# Patient Record
Sex: Male | Born: 1940 | Race: White | Hispanic: No | Marital: Married | Smoking: Former smoker
Health system: Southern US, Community
[De-identification: ages and names within clinical notes are randomized; demographics above are authoritative.]

## PROBLEM LIST (undated history)

## (undated) DIAGNOSIS — K8689 Other specified diseases of pancreas: Secondary | ICD-10-CM

## (undated) DIAGNOSIS — E785 Hyperlipidemia, unspecified: Secondary | ICD-10-CM

## (undated) DIAGNOSIS — M722 Plantar fascial fibromatosis: Secondary | ICD-10-CM

## (undated) DIAGNOSIS — H269 Unspecified cataract: Secondary | ICD-10-CM

## (undated) DIAGNOSIS — K219 Gastro-esophageal reflux disease without esophagitis: Secondary | ICD-10-CM

## (undated) DIAGNOSIS — R011 Cardiac murmur, unspecified: Secondary | ICD-10-CM

## (undated) DIAGNOSIS — Z86010 Personal history of colonic polyps: Secondary | ICD-10-CM

## (undated) DIAGNOSIS — N189 Chronic kidney disease, unspecified: Secondary | ICD-10-CM

## (undated) DIAGNOSIS — M109 Gout, unspecified: Secondary | ICD-10-CM

## (undated) DIAGNOSIS — H409 Unspecified glaucoma: Secondary | ICD-10-CM

## (undated) DIAGNOSIS — M7660 Achilles tendinitis, unspecified leg: Secondary | ICD-10-CM

## (undated) DIAGNOSIS — T7840XA Allergy, unspecified, initial encounter: Secondary | ICD-10-CM

## (undated) DIAGNOSIS — T4145XA Adverse effect of unspecified anesthetic, initial encounter: Secondary | ICD-10-CM

## (undated) DIAGNOSIS — I10 Essential (primary) hypertension: Secondary | ICD-10-CM

## (undated) DIAGNOSIS — M1990 Unspecified osteoarthritis, unspecified site: Secondary | ICD-10-CM

## (undated) DIAGNOSIS — E119 Type 2 diabetes mellitus without complications: Secondary | ICD-10-CM

## (undated) DIAGNOSIS — T8859XA Other complications of anesthesia, initial encounter: Secondary | ICD-10-CM

## (undated) DIAGNOSIS — M23204 Derangement of unspecified medial meniscus due to old tear or injury, left knee: Secondary | ICD-10-CM

## (undated) DIAGNOSIS — C61 Malignant neoplasm of prostate: Secondary | ICD-10-CM

## (undated) HISTORY — DX: Gastro-esophageal reflux disease without esophagitis: K21.9

## (undated) HISTORY — DX: Hyperlipidemia, unspecified: E78.5

## (undated) HISTORY — PX: CATARACT EXTRACTION: SUR2

## (undated) HISTORY — DX: Essential (primary) hypertension: I10

## (undated) HISTORY — DX: Derangement of unspecified medial meniscus due to old tear or injury, left knee: M23.204

## (undated) HISTORY — DX: Unspecified cataract: H26.9

## (undated) HISTORY — DX: Unspecified osteoarthritis, unspecified site: M19.90

## (undated) HISTORY — DX: Other specified diseases of pancreas: K86.89

## (undated) HISTORY — PX: TONSILLECTOMY: SUR1361

## (undated) HISTORY — PX: PROSTATE BIOPSY: SHX241

## (undated) HISTORY — DX: Type 2 diabetes mellitus without complications: E11.9

## (undated) HISTORY — DX: Chronic kidney disease, unspecified: N18.9

## (undated) HISTORY — DX: Unspecified glaucoma: H40.9

## (undated) HISTORY — DX: Gout, unspecified: M10.9

## (undated) HISTORY — DX: Personal history of colonic polyps: Z86.010

## (undated) HISTORY — DX: Allergy, unspecified, initial encounter: T78.40XA

## (undated) HISTORY — PX: COLONOSCOPY: SHX174

---
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
## 2008-04-18 ENCOUNTER — Encounter: Payer: Self-pay | Admitting: Internal Medicine

## 2008-04-18 LAB — CONVERTED CEMR LAB
Glucose, Bld: 127 mg/dL
HCT: 40.5 %
Hemoglobin: 13.8 g/dL
MCV: 94.2 fL
Platelets: 214 10*3/uL
RBC: 4.3 M/uL
RDW: 13.2 %
WBC: 5.9 10*3/uL

## 2009-08-08 ENCOUNTER — Encounter: Payer: Self-pay | Admitting: Internal Medicine

## 2009-08-08 LAB — CONVERTED CEMR LAB
Cholesterol: 169 mg/dL
HDL: 35 mg/dL
Hgb A1c MFr Bld: 6.5 %
LDL Cholesterol: 89 mg/dL
Triglyceride fasting, serum: 226 mg/dL

## 2009-11-16 HISTORY — PX: TOTAL HIP ARTHROPLASTY: SHX124

## 2009-11-21 ENCOUNTER — Encounter: Payer: Self-pay | Admitting: Internal Medicine

## 2009-11-21 LAB — CONVERTED CEMR LAB
Cholesterol: 161 mg/dL
HDL: 30 mg/dL
Hgb A1c MFr Bld: 5.7 %
LDL Cholesterol: 94 mg/dL
PSA: 3.77 ng/mL
Triglyceride fasting, serum: 187 mg/dL

## 2010-05-26 ENCOUNTER — Encounter: Payer: Self-pay | Admitting: Internal Medicine

## 2010-05-26 LAB — CONVERTED CEMR LAB
ALT: 18 units/L
AST: 24 units/L
Albumin: 4.1 g/dL
Alkaline Phosphatase: 67 units/L
BUN: 26 mg/dL
CO2: 27 meq/L
Calcium: 9.5 mg/dL
Chloride: 105 meq/L
Cholesterol: 205 mg/dL
Creatinine, Ser: 1.2 mg/dL
Glucose, Bld: 127 mg/dL
HCT: 38.4 %
HDL: 36 mg/dL
Hemoglobin: 13 g/dL
Hgb A1c MFr Bld: 5.9 %
LDL Cholesterol: 124 mg/dL
Platelets: 256 10*3/uL
Potassium: 4.5 meq/L
Sodium: 140 meq/L
Total Bilirubin: 0.7 mg/dL
Total Protein: 6.5 g/dL
Triglyceride fasting, serum: 222 mg/dL
WBC: 5.3 10*3/uL

## 2010-08-11 ENCOUNTER — Inpatient Hospital Stay (HOSPITAL_COMMUNITY): Admission: RE | Admit: 2010-08-11 | Discharge: 2010-08-14 | Payer: Self-pay | Admitting: Orthopedic Surgery

## 2010-08-16 DEATH — deceased

## 2010-09-26 ENCOUNTER — Encounter: Payer: Self-pay | Admitting: Internal Medicine

## 2010-09-26 LAB — CONVERTED CEMR LAB
Cholesterol: 145 mg/dL
HDL: 37 mg/dL
Hgb A1c MFr Bld: 6.1 %
LDL Cholesterol: 86 mg/dL
Triglyceride fasting, serum: 107 mg/dL

## 2011-01-04 ENCOUNTER — Encounter: Payer: Self-pay | Admitting: Internal Medicine

## 2011-01-05 ENCOUNTER — Encounter: Payer: Self-pay | Admitting: Internal Medicine

## 2011-01-05 ENCOUNTER — Other Ambulatory Visit: Payer: Medicare Other

## 2011-01-05 ENCOUNTER — Ambulatory Visit (INDEPENDENT_AMBULATORY_CARE_PROVIDER_SITE_OTHER): Payer: Medicare Other | Admitting: Internal Medicine

## 2011-01-05 ENCOUNTER — Other Ambulatory Visit: Payer: Self-pay | Admitting: Internal Medicine

## 2011-01-05 DIAGNOSIS — E785 Hyperlipidemia, unspecified: Secondary | ICD-10-CM | POA: Insufficient documentation

## 2011-01-05 DIAGNOSIS — I10 Essential (primary) hypertension: Secondary | ICD-10-CM

## 2011-01-05 DIAGNOSIS — E119 Type 2 diabetes mellitus without complications: Secondary | ICD-10-CM

## 2011-01-05 DIAGNOSIS — E1122 Type 2 diabetes mellitus with diabetic chronic kidney disease: Secondary | ICD-10-CM | POA: Insufficient documentation

## 2011-01-05 DIAGNOSIS — Z0000 Encounter for general adult medical examination without abnormal findings: Secondary | ICD-10-CM

## 2011-01-05 DIAGNOSIS — M1712 Unilateral primary osteoarthritis, left knee: Secondary | ICD-10-CM | POA: Insufficient documentation

## 2011-01-05 DIAGNOSIS — Z136 Encounter for screening for cardiovascular disorders: Secondary | ICD-10-CM

## 2011-01-05 DIAGNOSIS — Z79899 Other long term (current) drug therapy: Secondary | ICD-10-CM

## 2011-01-05 DIAGNOSIS — E1159 Type 2 diabetes mellitus with other circulatory complications: Secondary | ICD-10-CM | POA: Insufficient documentation

## 2011-01-05 DIAGNOSIS — M1990 Unspecified osteoarthritis, unspecified site: Secondary | ICD-10-CM | POA: Insufficient documentation

## 2011-01-05 DIAGNOSIS — E1169 Type 2 diabetes mellitus with other specified complication: Secondary | ICD-10-CM | POA: Insufficient documentation

## 2011-01-05 LAB — HEMOGLOBIN A1C: Hgb A1c MFr Bld: 6.7 % — ABNORMAL HIGH (ref 4.6–6.5)

## 2011-01-05 LAB — HEPATIC FUNCTION PANEL
ALT: 16 U/L (ref 0–53)
AST: 22 U/L (ref 0–37)
Albumin: 4.2 g/dL (ref 3.5–5.2)
Alkaline Phosphatase: 66 U/L (ref 39–117)
Bilirubin, Direct: 0.1 mg/dL (ref 0.0–0.3)
Total Bilirubin: 0.7 mg/dL (ref 0.3–1.2)
Total Protein: 6.8 g/dL (ref 6.0–8.3)

## 2011-01-05 LAB — LIPID PANEL
Cholesterol: 149 mg/dL (ref 0–200)
HDL: 40.6 mg/dL (ref >39.00)
LDL Cholesterol: 83 mg/dL (ref 0–99)
Total CHOL/HDL Ratio: 4
Triglycerides: 127 mg/dL (ref 0.0–149.0)
VLDL: 25.4 mg/dL (ref 0.0–40.0)

## 2011-01-05 LAB — CREATININE, SERUM: Creatinine, Ser: 1.3 mg/dL (ref 0.4–1.5)

## 2011-01-05 LAB — PSA: PSA: 3.38 ng/mL (ref 0.10–4.00)

## 2011-01-13 NOTE — Assessment & Plan Note (Signed)
Summary: NEW/MEDICARE/REQUESTS WELLNESS EXAM/CD   Vital Signs:  Patient profile:   70 year old male Height:      72 inches Weight:      187 pounds BMI:     25.45 O2 Sat:      96 % on Room air Temp:     97.5 degrees F oral Pulse rate:   68 / minute BP sitting:   118 / 68  (left arm) Cuff size:   regular  Vitals Entered By: Margaret Pyle, CMA (January 05, 2011 9:35 AM)  O2 Flow:  Room air CC: new pt, wellness exam   Primary Care Provider:  Newt Lukes, MD  CC:  new pt and wellness exam.  History of Present Illness: new pt to me and our practice, here to est care- [facility]) here for wellness exam - I have personally reviewed the Medicare Annual Wellness questionnaire and have noted 1.   The patient's medical and social history 2.   Their use of alcohol, tobacco or illicit drugs 3.   Their current medications and supplements 4.   The patient's functional ability including ADL's, fall risks, home safety risks and hearing or visual impairment. 5.   Diet and physical activities 6.   Evidence for depression or mood disorders  The patients weight, height, BMI and visual acuity have been recorded in the chart I have made referrals, counseling and provided education to the patient based review of the above and I have provided the pt with a written personalized care plan for preventive services.   also reviewed chronic med issues: HTN - reports compliance with ongoing medical treatment and no changes in medication dose or frequency. denies adverse side effects related to current therapy.   dyslipidemia - on statin -reports compliance with ongoing medical treatment and no changes in medication dose or frequency. denies adverse side effects related to current therapy.   DM2 - reports compliance with ongoing medical treatment and no changes in medication dose or frequency. denies adverse side effects related to current therapy. checks  cbgs  Current Medications (verified): 1)  Amlodipine Besylate 10 Mg Tabs (Amlodipine Besylate) 2)  Avandamet 2-500 Mg Tabs (Rosiglitazone-Metformin) .Marland Kitchen.. 1 By Mouth Qam 3)  Lipitor 40 Mg Tabs (Atorvastatin Calcium) .Marland Kitchen.. 1 Tab By Mouth At Bedtime 4)  Lisinopril 20 Mg Tabs (Lisinopril) .Marland Kitchen.. 1 Tab By Mouth Two Times A Day 5)  Prilosec 20 Mg Cpdr (Omeprazole) .Marland Kitchen.. 1 By Mouth At Bedtime 6)  Celebrex 200 Mg Caps (Celecoxib) .Marland Kitchen.. 1 By Mouth At Bedtime 7)  Hydrochlorothiazide 25 Mg Tabs (Hydrochlorothiazide) .Marland Kitchen.. 1 By Mouth Qam 8)  Aspirin 81 Mg Tabs (Aspirin) .Marland Kitchen.. 1 By Mouth Once Daily 9)  Glucosamine Sulfate  Powd (Glucosamine Sulfate) .Marland Kitchen.. 1 By Mouth At Bedtime 10)  Ra Krill Oil  Caps (Krill Oil) .Marland Kitchen.. 1 By Mouth Qam 11)  Senior Multivitamin Plus  Tabs (Multiple Vitamins-Minerals) .Marland Kitchen.. 1 By Mouth Once Daily 12)  Vitamin D 2000 Unit Tabs (Cholecalciferol) .Marland Kitchen.. 1 By Mouth Qam  Allergies (verified): No Known Drug Allergies  Past History:  Past Medical History: Hypertension diabetes mellitus 2 dyslipidemia Osteoarthritis  Past Surgical History: Total hip replacement, left (2011) Tonsillectomy (1945)  Family History: Family History Diabetes 1st degree relative Family History Hypertension Family History Ovarian cancer  Social History: retired married, lives with spouse no tobacco, no alcohol  Review of Systems       see HPI above. I have reviewed all other systems  and they were negative.   Physical Exam  General:  alert, well-developed, well-nourished, and cooperative to examination.    Head:  Normocephalic and atraumatic without obvious abnormalities. No apparent alopecia or balding. Eyes:  vision grossly intact; pupils equal, round and reactive to light.  conjunctiva and lids normal.    Ears:  R ear normal and L ear normal.   Mouth:  teeth and gums in good repair; mucous membranes moist, without lesions or ulcers. oropharynx clear without exudate, no erythema.  Neck:   supple, full ROM, no masses, no thyromegaly; no thyroid nodules or tenderness. no JVD or carotid bruits.   Lungs:  normal respiratory effort, no intercostal retractions or use of accessory muscles; normal breath sounds bilaterally - no crackles and no wheezes.    Heart:  normal rate, regular rhythm, no murmur, and no rub. BLE without edema. normal DP pulses and normal cap refill in all 4 extremities    Abdomen:  soft, non-tender, normal bowel sounds, no distention; no masses and no appreciable hepatomegaly or splenomegaly.   Rectal:  defer Genitalia:  defer Msk:  No deformity or scoliosis noted of thoracic or lumbar spine.   Neurologic:  alert & oriented X3 and cranial nerves II-XII symetrically intact.  strength normal in all extremities, sensation intact to light touch, and gait normal. speech fluent without dysarthria or aphasia; follows commands with good comprehension.  Skin:  no rashes, vesicles, ulcers, or erythema. No nodules or irregularity to palpation.  Psych:  Oriented X3, memory intact for recent and remote, normally interactive, good eye contact, not anxious appearing, not depressed appearing, and not agitated.     Diabetes Management Exam:    Foot Exam by Podiatrist:       Date: 01/27/2010       [facility]: [facility] [address]       Done by: [facility]   Impression & Recommendations:  Problem # 1:  PREVENTIVE HEALTH CARE (ICD-V70.0)  I have personally reviewed the Medicare Annual Wellness questionnaire and have noted 1.   The patient's medical and social history 2.   Their use of alcohol, tobacco or illicit drugs 3.   Their current medications and supplements 4.   The patient's functional ability including ADL's, fall risks, home safety risks and hearing or visual impairment. 5.   Diet and physical activities 6.   Evidence for depression or mood disorders  The patients weight, height, BMI and visual acuity have been recorded in the chart I have made referrals, counseling and provided  education to the patient based review of the above and I have provided the pt with a written personalized care plan for preventive services.    Orders: Medicare -1st Annual Wellness Visit [phone]) TLB-PSA (Prostate Specific Antigen) (84153-PSA) EKG w/ Interpretation (93000)  Problem # 2:  DIABETES MELLITUS, TYPE II (ICD-250.00)  His updated medication list for this problem includes:    Avandamet 2-500 Mg Tabs (Rosiglitazone-metformin) .Marland Kitchen... 1 by mouth two times a day    Lisinopril 20 Mg Tabs (Lisinopril) .Marland Kitchen... 1 tab by mouth two times a day    Aspirin 81 Mg Tabs (Aspirin) .Marland Kitchen... 1 by mouth once daily  Orders: TLB-Creatinine, Blood (82565-CREA) TLB-A1C / Hgb A1C (Glycohemoglobin) (83036-A1C)  Problem # 3:  HYPERTENSION (ICD-401.9)  His updated medication list for this problem includes:    Amlodipine Besylate 10 Mg Tabs (Amlodipine besylate) .Marland Kitchen... 1 by mouth once daily    Lisinopril 20 Mg Tabs (Lisinopril) .Marland Kitchen... 1 tab by mouth two times  a day    Hydrochlorothiazide 25 Mg Tabs (Hydrochlorothiazide) .Marland Kitchen... 1 by mouth  every morning  BP today: 118/68  Orders: TLB-Creatinine, Blood (82565-CREA)  Problem # 4:  DYSLIPIDEMIA (ICD-272.4)  His updated medication list for this problem includes:    Lipitor 40 Mg Tabs (Atorvastatin calcium) .Marland Kitchen... 1 tab by mouth at bedtime  Orders: TLB-Lipid Panel (80061-LIPID)  Complete Medication List: 1)  Amlodipine Besylate 10 Mg Tabs (Amlodipine besylate) .Marland Kitchen.. 1 by mouth once daily 2)  Avandamet 2-500 Mg Tabs (Rosiglitazone-metformin) .Marland Kitchen.. 1 by mouth two times a day 3)  Lipitor 40 Mg Tabs (Atorvastatin calcium) .Marland Kitchen.. 1 tab by mouth at bedtime 4)  Lisinopril 20 Mg Tabs (Lisinopril) .Marland Kitchen.. 1 tab by mouth two times a day 5)  Prilosec 20 Mg Cpdr (Omeprazole) .Marland Kitchen.. 1 by mouth at bedtime 6)  Celebrex 200 Mg Caps (Celecoxib) .Marland Kitchen.. 1 by mouth at bedtime 7)  Hydrochlorothiazide 25 Mg Tabs (Hydrochlorothiazide) .Marland Kitchen.. 1 by mouth  every morning 8)  Aspirin 81 Mg Tabs  (Aspirin) .Marland Kitchen.. 1 by mouth once daily 9)  Glucosamine Sulfate Powd (Glucosamine sulfate) .Marland Kitchen.. 1 by mouth at bedtime 10)  Ra Krill Oil Caps (Krill oil) .Marland Kitchen.. 1 by mouth  every morning 11)  Senior Multivitamin Plus Tabs (Multiple vitamins-minerals) .Marland Kitchen.. 1 by mouth once daily 12)  Vitamin D 2000 Unit Tabs (Cholecalciferol) .Marland Kitchen.. 1 by mouth  every morning 13)  Niacin Flush Free 500 Mg Caps (Inositol niacinate) .Marland Kitchen.. 1 by mouth at bedtime 14)  Magnesium Oxide 400 Mg Tabs (Magnesium oxide) .Marland Kitchen.. 1 by mouth two times a day  Other Orders: TLB-Hepatic/Liver Function Pnl (80076-HEPATIC)  Patient Instructions: 1)  it was good to see you today. 2)  we have review your prior lab results and medications as well as medical history 3)  I have provided you with a copy of your personalized plan for preventive services. Please take the time to review along with your updated medication list.  4)  test(s) ordered today - your results will be called to you after review in 48-72 hours from the time of test completion; if any changes need to be made or if there are abnormal results, you will be notified at that time (including change in diabetes medications) 5)  medications reviewed - resume Mag oxide and niacin -no other changes recommended 6)  will send for records from dr. [facility] to incorporate and review 7)  Please schedule a follow-up appointment in 3 months for diabetes check and medication review, call sooner if problems.    Orders Added: 1)  Medicare -1st Annual Wellness Visit [G0438] 2)  New Patient Level III [99203] 3)  TLB-Lipid Panel [80061-LIPID] 4)  TLB-Hepatic/Liver Function Pnl [80076-HEPATIC] 5)  TLB-Creatinine, Blood [82565-CREA] 6)  TLB-A1C / Hgb A1C (Glycohemoglobin) [83036-A1C] 7)  TLB-PSA (Prostate Specific Antigen) [84153-PSA] 8)  EKG w/ Interpretation [93000]

## 2011-01-13 NOTE — Letter (Signed)
Summary: [facility]  [facility]   Imported By: Sherian Rein 01/08/2011 10:05:48  _____________________________________________________________________  External Attachment:    Type:   Image     Comment:   External Document

## 2011-01-13 NOTE — Medication Information (Signed)
Summary: Medication Hx/Patient  Medication Hx/Patient   Imported By: Sherian Rein 01/08/2011 10:03:07  _____________________________________________________________________  External Attachment:    Type:   Image     Comment:   External Document

## 2011-01-13 NOTE — Letter (Signed)
Summary: Medicare Wellness Visit/[facility]  Medicare Wellness Visit/[facility]   Imported By: Sherian Rein 01/08/2011 10:02:00  _____________________________________________________________________  External Attachment:    Type:   Image     Comment:   External Document

## 2011-01-15 ENCOUNTER — Encounter: Payer: Self-pay | Admitting: Internal Medicine

## 2011-01-19 ENCOUNTER — Telehealth: Payer: Self-pay | Admitting: Internal Medicine

## 2011-01-27 NOTE — Letter (Signed)
[facility]   Imported By: Sherian Rein 01/21/2011 11:09:59  _____________________________________________________________________  External Attachment:    Type:   Image     Comment:   External Document

## 2011-01-27 NOTE — Progress Notes (Signed)
Summary: Labs prior?  Phone Note Call from Patient Call back at Home Phone [phone]   Caller: Patient Summary of Call: Pt has appt scheduled for 04/06/2011 and is requesting to have labs prior. Okay to schedule? Initial call taken by: Margaret Pyle, CMA,  January 19, 2011 2:19 PM  Follow-up for Phone Call        yes - a1c (250.00) - thanks Follow-up by: Newt Lukes MD,  January 19, 2011 3:40 PM  Additional Follow-up for Phone Call Additional follow up Details #1::        LaToya, can you help schedule pt for labs? Thanks Additional Follow-up by: Margaret Pyle, CMA,  January 20, 2011 8:01 AM    Additional Follow-up for Phone Call Additional follow up Details #2::    Labs scheduled, patient aware. Follow-up by: Daphane Shepherd,  January 20, 2011 8:26 AM

## 2011-01-29 LAB — BASIC METABOLIC PANEL
BUN: 14 mg/dL (ref 6–23)
BUN: 17 mg/dL (ref 6–23)
CO2: 26 mEq/L (ref 19–32)
CO2: 27 mEq/L (ref 19–32)
Calcium: 8.1 mg/dL — ABNORMAL LOW (ref 8.4–10.5)
Calcium: 8.4 mg/dL (ref 8.4–10.5)
Chloride: 102 mEq/L (ref 96–112)
Chloride: 104 mEq/L (ref 96–112)
Creatinine, Ser: 1.01 mg/dL (ref 0.4–1.5)
Creatinine, Ser: 1.38 mg/dL (ref 0.4–1.5)
GFR calc Af Amer: >60 mL/min (ref >60)
GFR calc Af Amer: >60 mL/min (ref >60)
GFR calc non Af Amer: 51 mL/min — ABNORMAL LOW (ref >60)
GFR calc non Af Amer: >60 mL/min (ref >60)
Glucose, Bld: 126 mg/dL — ABNORMAL HIGH (ref 70–99)
Glucose, Bld: 140 mg/dL — ABNORMAL HIGH (ref 70–99)
Potassium: 4.3 mEq/L (ref 3.5–5.1)
Potassium: 4.4 mEq/L (ref 3.5–5.1)
Sodium: 134 mEq/L — ABNORMAL LOW (ref 135–145)
Sodium: 135 mEq/L (ref 135–145)

## 2011-01-29 LAB — CBC
HCT: 24.6 % — ABNORMAL LOW (ref 39.0–52.0)
HCT: 25.5 % — ABNORMAL LOW (ref 39.0–52.0)
HCT: 28.3 % — ABNORMAL LOW (ref 39.0–52.0)
HCT: 36.7 % — ABNORMAL LOW (ref 39.0–52.0)
Hemoglobin: 12.5 g/dL — ABNORMAL LOW (ref 13.0–17.0)
Hemoglobin: 8.4 g/dL — ABNORMAL LOW (ref 13.0–17.0)
Hemoglobin: 8.9 g/dL — ABNORMAL LOW (ref 13.0–17.0)
Hemoglobin: 9.6 g/dL — ABNORMAL LOW (ref 13.0–17.0)
MCH: 32.4 pg (ref 26.0–34.0)
MCH: 32.5 pg (ref 26.0–34.0)
MCH: 32.5 pg (ref 26.0–34.0)
MCH: 33.4 pg (ref 26.0–34.0)
MCHC: 34 g/dL (ref 30.0–36.0)
MCHC: 34.1 g/dL (ref 30.0–36.0)
MCHC: 34.2 g/dL (ref 30.0–36.0)
MCHC: 35.2 g/dL (ref 30.0–36.0)
MCV: 94.9 fL (ref 78.0–100.0)
MCV: 95.1 fL (ref 78.0–100.0)
MCV: 95.3 fL (ref 78.0–100.0)
MCV: 95.4 fL (ref 78.0–100.0)
Platelets: 130 10*3/uL — ABNORMAL LOW (ref 150–400)
Platelets: 140 10*3/uL — ABNORMAL LOW (ref 150–400)
Platelets: 174 10*3/uL (ref 150–400)
Platelets: 182 10*3/uL (ref 150–400)
RBC: 2.59 MIL/uL — ABNORMAL LOW (ref 4.22–5.81)
RBC: 2.68 MIL/uL — ABNORMAL LOW (ref 4.22–5.81)
RBC: 2.97 MIL/uL — ABNORMAL LOW (ref 4.22–5.81)
RBC: 3.85 MIL/uL — ABNORMAL LOW (ref 4.22–5.81)
RDW: 13.4 % (ref 11.5–15.5)
RDW: 13.5 % (ref 11.5–15.5)
RDW: 13.5 % (ref 11.5–15.5)
RDW: 14.1 % (ref 11.5–15.5)
WBC: 6.9 10*3/uL (ref 4.0–10.5)
WBC: 7.9 10*3/uL (ref 4.0–10.5)
WBC: 8.2 10*3/uL (ref 4.0–10.5)
WBC: 9.8 10*3/uL (ref 4.0–10.5)

## 2011-01-29 LAB — COMPREHENSIVE METABOLIC PANEL
ALT: 18 U/L (ref 0–53)
AST: 20 U/L (ref 0–37)
Albumin: 4.1 g/dL (ref 3.5–5.2)
Alkaline Phosphatase: 78 U/L (ref 39–117)
BUN: 31 mg/dL — ABNORMAL HIGH (ref 6–23)
CO2: 27 mEq/L (ref 19–32)
Calcium: 9.3 mg/dL (ref 8.4–10.5)
Chloride: 105 mEq/L (ref 96–112)
Creatinine, Ser: 1.33 mg/dL (ref 0.4–1.5)
GFR calc Af Amer: >60 mL/min (ref >60)
GFR calc non Af Amer: 53 mL/min — ABNORMAL LOW (ref >60)
Glucose, Bld: 167 mg/dL — ABNORMAL HIGH (ref 70–99)
Potassium: 4.8 mEq/L (ref 3.5–5.1)
Sodium: 138 mEq/L (ref 135–145)
Total Bilirubin: 0.7 mg/dL (ref 0.3–1.2)
Total Protein: 6.9 g/dL (ref 6.0–8.3)

## 2011-01-29 LAB — URINALYSIS, ROUTINE W REFLEX MICROSCOPIC
Bilirubin Urine: NEGATIVE
Glucose, UA: NEGATIVE mg/dL
Hgb urine dipstick: NEGATIVE
Ketones, ur: NEGATIVE mg/dL
Nitrite: NEGATIVE
Protein, ur: NEGATIVE mg/dL
Specific Gravity, Urine: 1.023 (ref 1.005–1.030)
Urobilinogen, UA: 0.2 mg/dL (ref 0.0–1.0)
pH: 5.5 (ref 5.0–8.0)

## 2011-01-29 LAB — SURGICAL PCR SCREEN
MRSA, PCR: NEGATIVE
Staphylococcus aureus: NEGATIVE

## 2011-01-29 LAB — GLUCOSE, CAPILLARY
Glucose-Capillary: 121 mg/dL — ABNORMAL HIGH (ref 70–99)
Glucose-Capillary: 126 mg/dL — ABNORMAL HIGH (ref 70–99)
Glucose-Capillary: 138 mg/dL — ABNORMAL HIGH (ref 70–99)
Glucose-Capillary: 138 mg/dL — ABNORMAL HIGH (ref 70–99)
Glucose-Capillary: 139 mg/dL — ABNORMAL HIGH (ref 70–99)
Glucose-Capillary: 141 mg/dL — ABNORMAL HIGH (ref 70–99)
Glucose-Capillary: 142 mg/dL — ABNORMAL HIGH (ref 70–99)
Glucose-Capillary: 144 mg/dL — ABNORMAL HIGH (ref 70–99)
Glucose-Capillary: 147 mg/dL — ABNORMAL HIGH (ref 70–99)
Glucose-Capillary: 163 mg/dL — ABNORMAL HIGH (ref 70–99)
Glucose-Capillary: 194 mg/dL — ABNORMAL HIGH (ref 70–99)
Glucose-Capillary: 196 mg/dL — ABNORMAL HIGH (ref 70–99)

## 2011-01-29 LAB — PROTIME-INR
INR: 1.01 (ref 0.00–1.49)
INR: 1.15 (ref 0.00–1.49)
INR: 1.82 — ABNORMAL HIGH (ref 0.00–1.49)
INR: 2.67 — ABNORMAL HIGH (ref 0.00–1.49)
Prothrombin Time: 13.5 seconds (ref 11.6–15.2)
Prothrombin Time: 14.9 seconds (ref 11.6–15.2)
Prothrombin Time: 21.2 seconds — ABNORMAL HIGH (ref 11.6–15.2)
Prothrombin Time: 28.5 seconds — ABNORMAL HIGH (ref 11.6–15.2)

## 2011-01-29 LAB — ABO/RH: ABO/RH(D): O POS

## 2011-01-29 LAB — TYPE AND SCREEN
ABO/RH(D): O POS
Antibody Screen: NEGATIVE

## 2011-01-29 LAB — APTT: aPTT: 36 seconds (ref 24–37)

## 2011-04-01 ENCOUNTER — Other Ambulatory Visit (INDEPENDENT_AMBULATORY_CARE_PROVIDER_SITE_OTHER): Payer: Medicare Other

## 2011-04-01 ENCOUNTER — Other Ambulatory Visit: Payer: Self-pay | Admitting: Internal Medicine

## 2011-04-01 DIAGNOSIS — E119 Type 2 diabetes mellitus without complications: Secondary | ICD-10-CM

## 2011-04-01 LAB — HEMOGLOBIN A1C: Hgb A1c MFr Bld: 6 % (ref 4.6–6.5)

## 2011-04-06 ENCOUNTER — Ambulatory Visit (INDEPENDENT_AMBULATORY_CARE_PROVIDER_SITE_OTHER): Payer: Medicare Other | Admitting: Internal Medicine

## 2011-04-06 ENCOUNTER — Encounter: Payer: Self-pay | Admitting: Internal Medicine

## 2011-04-06 DIAGNOSIS — I10 Essential (primary) hypertension: Secondary | ICD-10-CM

## 2011-04-06 DIAGNOSIS — E785 Hyperlipidemia, unspecified: Secondary | ICD-10-CM

## 2011-04-06 DIAGNOSIS — E119 Type 2 diabetes mellitus without complications: Secondary | ICD-10-CM

## 2011-04-06 MED ORDER — LISINOPRIL 20 MG PO TABS: 20.0000 mg | ORAL_TABLET | Freq: Two times a day (BID) | ORAL | Status: DC

## 2011-04-06 MED ORDER — ATORVASTATIN CALCIUM 40 MG PO TABS: 40.0000 mg | ORAL_TABLET | Freq: Every day | ORAL | Status: DC

## 2011-04-06 MED ORDER — METFORMIN HCL ER 500 MG PO TB24: 1000.0000 mg | ORAL_TABLET | Freq: Two times a day (BID) | ORAL | Status: DC

## 2011-04-06 MED ORDER — HYDROCHLOROTHIAZIDE 25 MG PO TABS: 25.0000 mg | ORAL_TABLET | ORAL | Status: DC

## 2011-04-06 MED ORDER — AMLODIPINE BESYLATE 10 MG PO TABS: 10.0000 mg | ORAL_TABLET | Freq: Every day | ORAL | Status: DC

## 2011-04-06 NOTE — Assessment & Plan Note (Signed)
Lab Results  Component Value Date   LDLCALC 83 01/05/2011   Continue statin as well as omega 3 and weight control/exercise

## 2011-04-06 NOTE — Assessment & Plan Note (Signed)
BP Readings from Last 3 Encounters:  04/06/11 120/74  01/05/11 118/68   The current medical regimen is effective;  continue present plan and medications.

## 2011-04-06 NOTE — Patient Instructions (Addendum)
It was good to see you today. Labs and exam look great - keep up the good ork Medications reviewed, no changes at this time. Refill on medication(s) as discussed today. Please schedule followup in 4-6 months, call sooner if problems. Ok to do labs 2-3 days before visit

## 2011-04-06 NOTE — Assessment & Plan Note (Signed)
Lab Results  Component Value Date   HGBA1C 6.0 04/01/2011   Working on weight control as well as medication as ongoing - continue same

## 2011-04-06 NOTE — Progress Notes (Signed)
  Subjective:    Patient ID: Nathan Gomez, male    DOB: [birth year], 70 y.o.   MRN: [id number]  HPI  Here for follow up - reviewed chronic med issues today: HTN - reports compliance with ongoing medical treatment and no changes in medication dose or frequency. denies adverse side effects related to current therapy.   dyslipidemia - on statin -reports compliance with ongoing medical treatment and no changes in medication dose or frequency. denies adverse side effects related to current therapy.   DM2 - reports compliance with ongoing medical treatment and no changes in medication dose or frequency. denies adverse side effects related to current therapy. checks cbgs 1-2x/day  Past Medical History  Diagnosis Date  . Hypertension   . Diabetes mellitus     Type II  . Dyslipidemia   . Osteoarthritis      Review of Systems  Constitutional: Negative for fever.  Respiratory: Negative for cough and shortness of breath.   Musculoskeletal:       L sciatica along hip and quad  Neurological: Negative for headaches.       Objective:   Physical Exam BP 120/74  Pulse 72  Temp(Src) 98.6 F (37 C) (Oral)  Ht 6' (1.829 m)  Wt 180 lb 1.9 oz (81.702 kg)  BMI 24.43 kg/m2  SpO2 98%  Physical Exam  Constitutional:  oriented to person, place, and time. appears well-developed and well-nourished. No distress.  Neck: Normal range of motion. Neck supple. No JVD present. No thyromegaly present.  Cardiovascular: Normal rate, regular rhythm and normal heart sounds.  No murmur heard. Pulmonary/Chest: Effort normal and breath sounds normal. No respiratory distress. no wheezes.  Neurological: he is alert and oriented to person, place, and time. No cranial nerve deficit. Coordination normal.  Psychiatric: he has a normal mood and affect. behavior is normal. Judgment and thought content normal.   Lab Results  Component Value Date   WBC 6.9 08/14/2010   HGB 8.9* 08/14/2010   HCT 25.5* 08/14/2010   PLT  140* 08/14/2010   CHOL 149 01/05/2011   TRIG 127.0 01/05/2011   HDL 40.60 01/05/2011   ALT 16 01/05/2011   AST 22 01/05/2011   NA 135 08/13/2010   K 4.4 08/13/2010   CL 104 08/13/2010   CREATININE 1.3 01/05/2011   BUN 17 08/13/2010   CO2 27 08/13/2010   PSA 3.38 01/05/2011   INR 2.67* 08/14/2010   HGBA1C 6.0 04/01/2011         Assessment & Plan:  See problem list. Medications and labs reviewed today.

## 2011-09-23 ENCOUNTER — Other Ambulatory Visit (INDEPENDENT_AMBULATORY_CARE_PROVIDER_SITE_OTHER): Payer: Medicare Other

## 2011-09-23 DIAGNOSIS — E785 Hyperlipidemia, unspecified: Secondary | ICD-10-CM

## 2011-09-23 DIAGNOSIS — E119 Type 2 diabetes mellitus without complications: Secondary | ICD-10-CM

## 2011-09-23 LAB — HEMOGLOBIN A1C: Hgb A1c MFr Bld: 6.3 % (ref 4.6–6.5)

## 2011-09-23 LAB — LIPID PANEL
Cholesterol: 120 mg/dL (ref 0–200)
HDL: 39.3 mg/dL (ref >39.00)
LDL Cholesterol: 71 mg/dL (ref 0–99)
Total CHOL/HDL Ratio: 3
Triglycerides: 51 mg/dL (ref 0.0–149.0)
VLDL: 10.2 mg/dL (ref 0.0–40.0)

## 2011-09-28 ENCOUNTER — Encounter: Payer: Self-pay | Admitting: Internal Medicine

## 2011-09-28 ENCOUNTER — Ambulatory Visit (INDEPENDENT_AMBULATORY_CARE_PROVIDER_SITE_OTHER): Payer: Medicare Other | Admitting: Internal Medicine

## 2011-09-28 VITALS — BP 118/64 | HR 73 | Temp 98.6°F | Resp 16 | Wt 182.2 lb

## 2011-09-28 DIAGNOSIS — Z23 Encounter for immunization: Secondary | ICD-10-CM

## 2011-09-28 DIAGNOSIS — D649 Anemia, unspecified: Secondary | ICD-10-CM | POA: Insufficient documentation

## 2011-09-28 DIAGNOSIS — I10 Essential (primary) hypertension: Secondary | ICD-10-CM

## 2011-09-28 DIAGNOSIS — E785 Hyperlipidemia, unspecified: Secondary | ICD-10-CM

## 2011-09-28 DIAGNOSIS — E119 Type 2 diabetes mellitus without complications: Secondary | ICD-10-CM

## 2011-09-28 NOTE — Assessment & Plan Note (Signed)
Related to prior surgery - no fatigue or symptoms Due for colo 2014 - [facility] pt to call sooner if problems

## 2011-09-28 NOTE — Assessment & Plan Note (Signed)
Lab Results  Component Value Date   HGBA1C 6.3 09/23/2011   Working on weight control as well as medication as ongoing - continue same

## 2011-09-28 NOTE — Assessment & Plan Note (Signed)
BP Readings from Last 3 Encounters:  09/28/11 118/64  04/06/11 120/74  01/05/11 118/68   The current medical regimen is effective;  continue present plan and medications.

## 2011-09-28 NOTE — Progress Notes (Signed)
  Subjective:    Patient ID: Nathan Gomez, male    DOB: June 08, 1941, 70 y.o.   MRN: [id number]  HPI   Here for follow up - reviewed chronic med issues today:  HTN - reports compliance with ongoing medical treatment and no changes in medication dose or frequency. denies adverse side effects related to current therapy.   dyslipidemia - on statin -reports compliance with ongoing medical treatment and no changes in medication dose or frequency. denies adverse side effects related to current therapy.   DM2 - reports compliance with ongoing medical treatment and no changes in medication dose or frequency. denies adverse side effects related to current therapy. checks cbgs 1-2x/day  Past Medical History  Diagnosis Date  . Hypertension   . Diabetes mellitus     Type II  . Dyslipidemia   . Osteoarthritis      Review of Systems  Constitutional: Negative for fever.  Respiratory: Negative for cough and shortness of breath.   Musculoskeletal:       L sciatica along hip and quad  Neurological: Negative for headaches.       Objective:   Physical Exam  BP 118/64  Pulse 73  Temp(Src) 98.6 F (37 C) (Oral)  Resp 16  Wt 182 lb 4 oz (82.668 kg)  SpO2 96% Wt Readings from Last 3 Encounters:  09/28/11 182 lb 4 oz (82.668 kg)  04/06/11 180 lb 1.9 oz (81.702 kg)  01/05/11 187 lb (84.823 kg)   Constitutional: He appears well-developed and well-nourished. No distress.  Neck: Normal range of motion. Neck supple. No JVD present. No thyromegaly present.  Cardiovascular: Normal rate, regular rhythm and normal heart sounds.  No murmur heard. no BLE edema Pulmonary/Chest: Effort normal and breath sounds normal. No respiratory distress. no wheezes.  Neurological: he is alert and oriented to person, place, and time. No cranial nerve deficit. Coordination normal.  Psychiatric: he has a normal mood and affect. behavior is normal. Judgment and thought content normal.   Lab Results  Component  Value Date   WBC 6.9 08/14/2010   HGB 8.9* 08/14/2010   HCT 25.5* 08/14/2010   PLT 140* 08/14/2010   CHOL 120 09/23/2011   TRIG 51.0 09/23/2011   HDL 39.30 09/23/2011   ALT 16 01/05/2011   AST 22 01/05/2011   NA 135 08/13/2010   K 4.4 08/13/2010   CL 104 08/13/2010   CREATININE 1.3 01/05/2011   BUN 17 08/13/2010   CO2 27 08/13/2010   PSA 3.38 01/05/2011   INR 2.67* 08/14/2010   HGBA1C 6.3 09/23/2011         Assessment & Plan:  See problem list. Medications and labs reviewed today.  L sciatica - follows with ortho - [facility] for same - s/p back exercise program (without PT) and continued pain symptoms  - for ortho follow up as ongoing  Anemia - hx same post op - plan recheck at next OV, sooner if symptoms or problems

## 2011-09-28 NOTE — Patient Instructions (Addendum)
It was good to see you today. Labs and exam look great - keep up the good work! Medications reviewed, no changes at this time. Good luck with your hip/back pain symptoms - ask Dr. Berton Lan about physical therapy referral Please schedule followup in 6 months to check diabetes mellitus, cholesterol and anemia status, call sooner if problems.

## 2011-09-28 NOTE — Assessment & Plan Note (Signed)
Lab Results  Component Value Date   LDLCALC 71 09/23/2011   Continue statin as well as omega 3 and weight control/exercise

## 2011-12-09 ENCOUNTER — Other Ambulatory Visit: Payer: Self-pay | Admitting: Internal Medicine

## 2012-02-16 IMAGING — CR DG HIP (WITH OR WITHOUT PELVIS) 2-3V*L*
3 series · 3 of 3 positions shown · non-contrast
Comparison: None.

CLINICAL DATA: Preop evaluation for left hip replacement.

LEFT HIP - COMPLETE 2+ VIEW

[t pelvis a.p.]
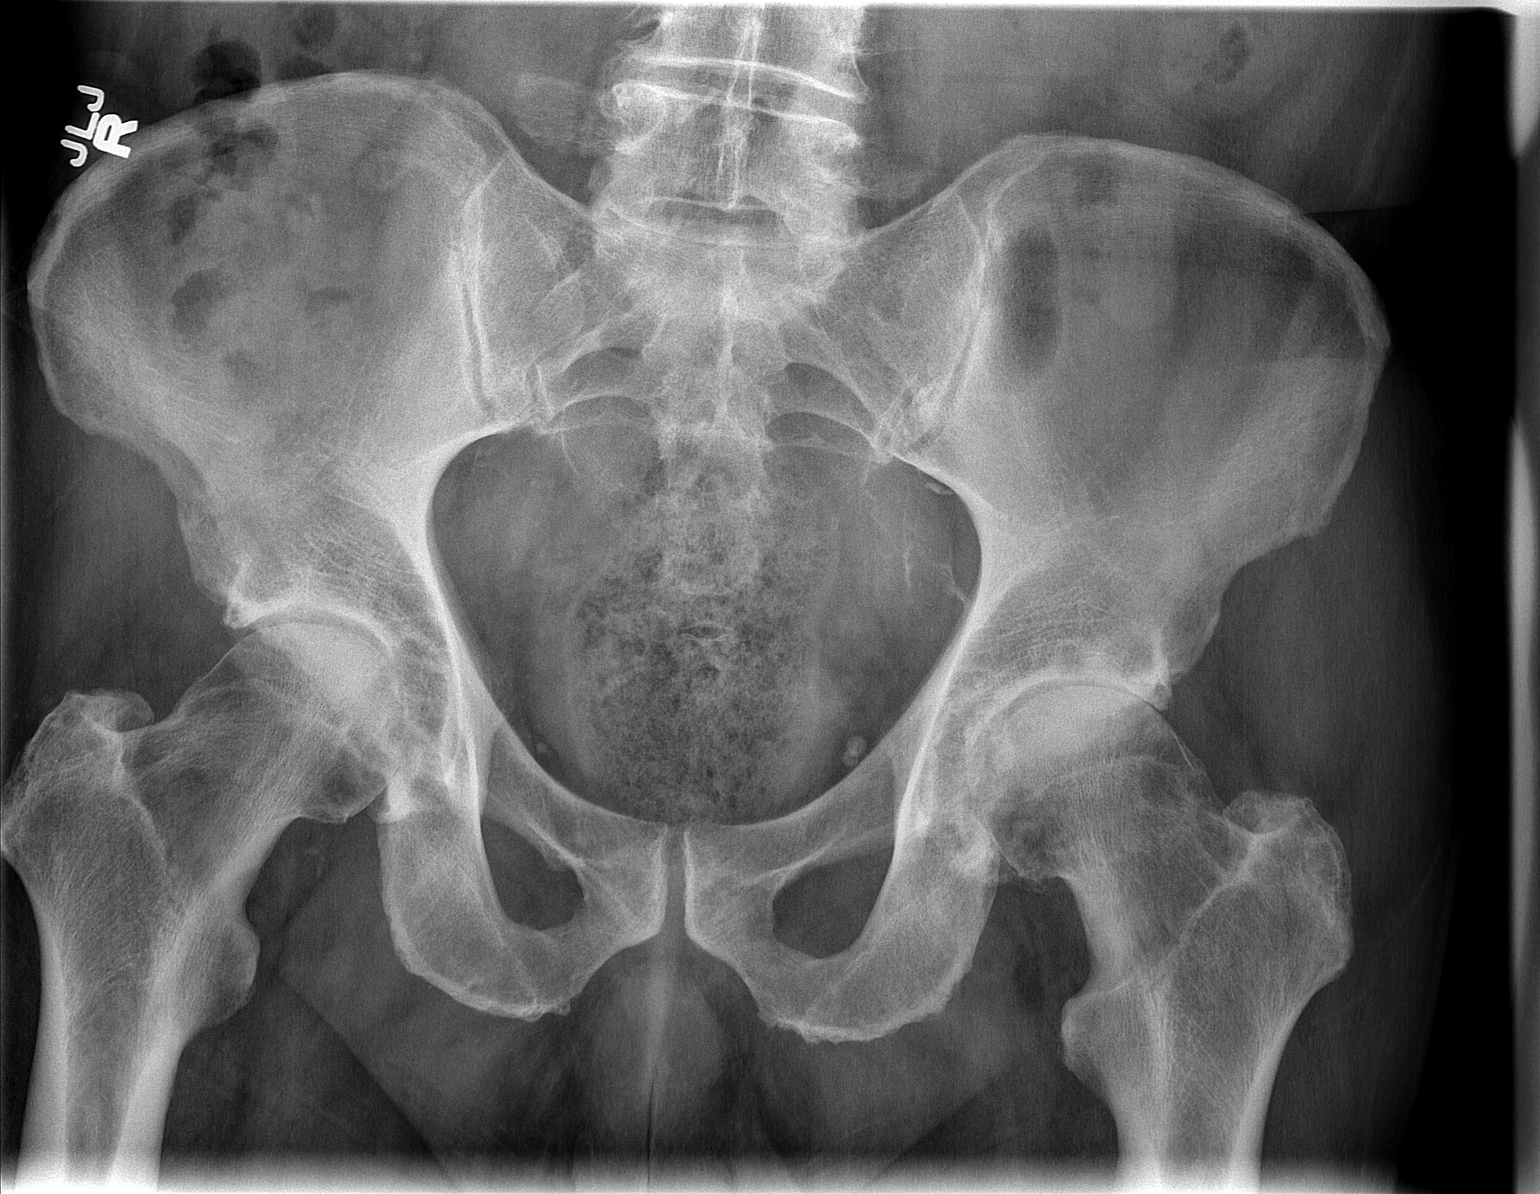

[t hip ap left]
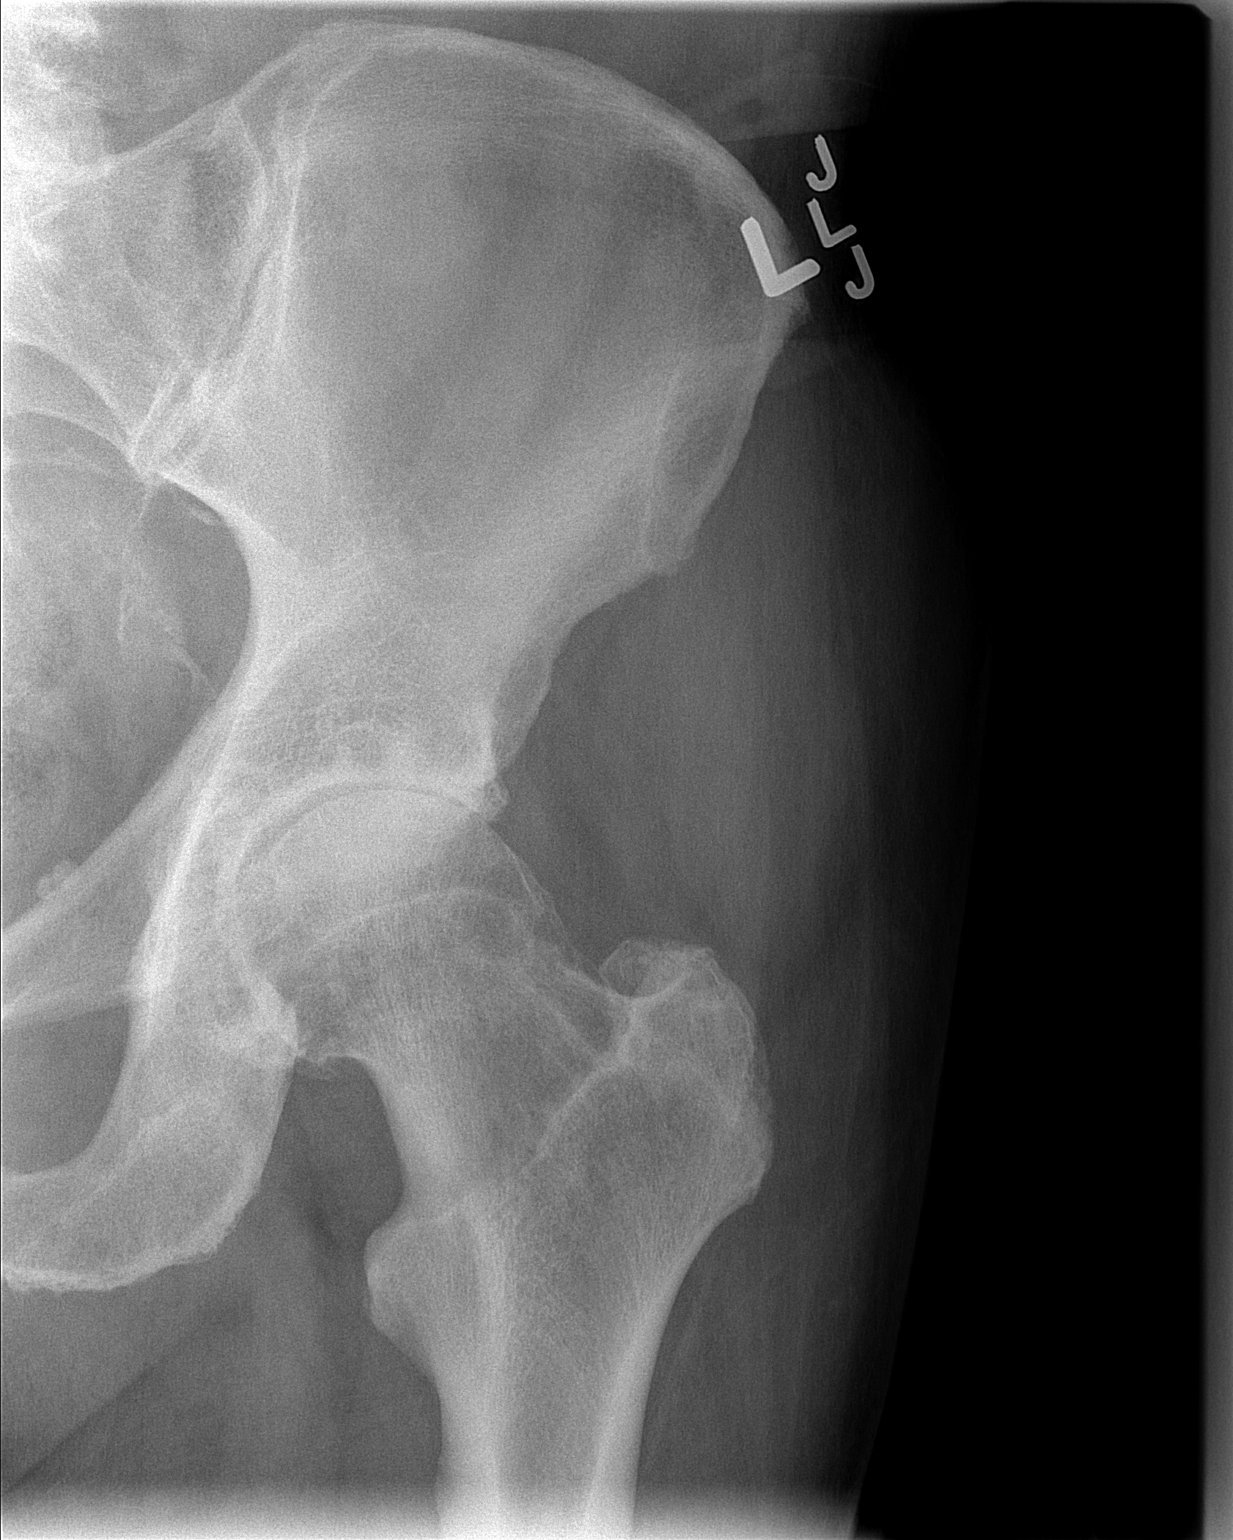

[t hip frog leg left]
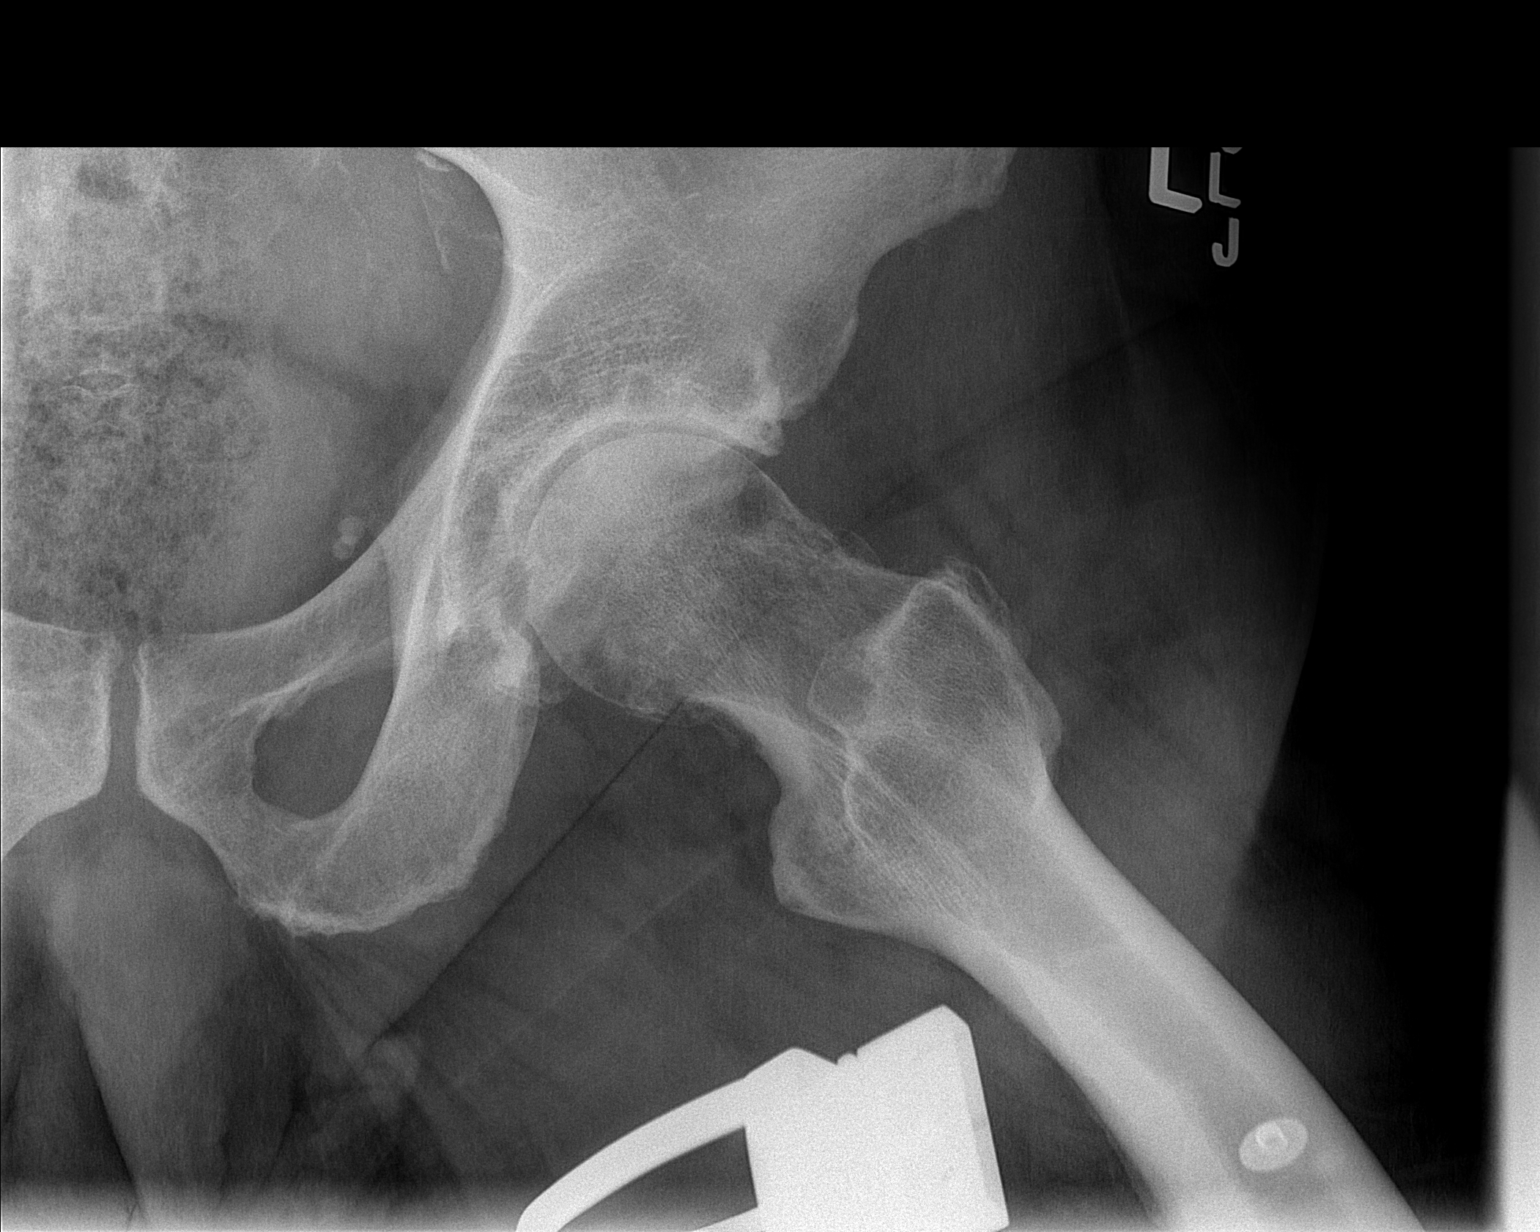

[3 of 3 positions shown; findings below may reference images not displayed]

FINDINGS: No acute bony abnormality.  Osteoarthritis is visualized
in the left hip with joint space narrowing, spurring, and
subchondral cyst formation.  More modest degenerative changes are
seen in the right hip.
IMPRESSION: Left hip osteoarthritis.

## 2012-02-22 ENCOUNTER — Encounter: Payer: Self-pay | Admitting: Internal Medicine

## 2012-02-22 ENCOUNTER — Ambulatory Visit (INDEPENDENT_AMBULATORY_CARE_PROVIDER_SITE_OTHER): Payer: Medicare Other | Admitting: Internal Medicine

## 2012-02-22 ENCOUNTER — Ambulatory Visit (INDEPENDENT_AMBULATORY_CARE_PROVIDER_SITE_OTHER)
Admission: RE | Admit: 2012-02-22 | Discharge: 2012-02-22 | Disposition: A | Discharge status: Home / Self Care | Payer: Medicare Other | Source: Ambulatory Visit | Attending: Internal Medicine | Admitting: Internal Medicine

## 2012-02-22 ENCOUNTER — Other Ambulatory Visit (INDEPENDENT_AMBULATORY_CARE_PROVIDER_SITE_OTHER): Payer: Medicare Other

## 2012-02-22 VITALS — BP 102/62 | HR 66 | Temp 97.3°F | Resp 16 | Wt 182.0 lb

## 2012-02-22 DIAGNOSIS — D649 Anemia, unspecified: Secondary | ICD-10-CM | POA: Insufficient documentation

## 2012-02-22 DIAGNOSIS — D510 Vitamin B12 deficiency anemia due to intrinsic factor deficiency: Secondary | ICD-10-CM

## 2012-02-22 DIAGNOSIS — E785 Hyperlipidemia, unspecified: Secondary | ICD-10-CM

## 2012-02-22 DIAGNOSIS — E119 Type 2 diabetes mellitus without complications: Secondary | ICD-10-CM

## 2012-02-22 DIAGNOSIS — R05 Cough: Secondary | ICD-10-CM | POA: Diagnosis not present

## 2012-02-22 DIAGNOSIS — R059 Cough, unspecified: Secondary | ICD-10-CM

## 2012-02-22 DIAGNOSIS — I10 Essential (primary) hypertension: Secondary | ICD-10-CM

## 2012-02-22 DIAGNOSIS — Z79899 Other long term (current) drug therapy: Secondary | ICD-10-CM

## 2012-02-22 LAB — CBC WITH DIFFERENTIAL/PLATELET
Basophils Absolute: 0 10*3/uL (ref 0.0–0.1)
Basophils Relative: 0.1 % (ref 0.0–3.0)
Eosinophils Absolute: 0 10*3/uL (ref 0.0–0.7)
Eosinophils Relative: 0.8 % (ref 0.0–5.0)
HCT: 41.7 % (ref 39.0–52.0)
Hemoglobin: 13.7 g/dL (ref 13.0–17.0)
Lymphocytes Relative: 24.7 % (ref 12.0–46.0)
Lymphs Abs: 1.1 10*3/uL (ref 0.7–4.0)
MCHC: 32.9 g/dL (ref 30.0–36.0)
MCV: 92.4 fl (ref 78.0–100.0)
Monocytes Absolute: 0.5 10*3/uL (ref 0.1–1.0)
Monocytes Relative: 11.3 % (ref 3.0–12.0)
Neutro Abs: 2.9 10*3/uL (ref 1.4–7.7)
Neutrophils Relative %: 63.1 % (ref 43.0–77.0)
Platelets: 182 10*3/uL (ref 150.0–400.0)
RBC: 4.52 Mil/uL (ref 4.22–5.81)
RDW: 14.2 % (ref 11.5–14.6)
WBC: 4.6 10*3/uL (ref 4.5–10.5)

## 2012-02-22 LAB — URINALYSIS, ROUTINE W REFLEX MICROSCOPIC
Bilirubin Urine: NEGATIVE
Hgb urine dipstick: NEGATIVE
Ketones, ur: NEGATIVE
Leukocytes, UA: NEGATIVE
Nitrite: NEGATIVE
Specific Gravity, Urine: 1.015 (ref 1.000–1.030)
Total Protein, Urine: NEGATIVE
Urine Glucose: NEGATIVE
Urobilinogen, UA: 0.2 (ref 0.0–1.0)
pH: 5.5 (ref 5.0–8.0)

## 2012-02-22 NOTE — Patient Instructions (Signed)
Upper Respiratory Infection, Adult An upper respiratory infection (URI) is also known as the common cold. It is often caused by a type of germ (virus). Colds are easily spread (contagious). You can pass it to others by kissing, coughing, sneezing, or drinking out of the same glass. Usually, you get better in 1 or 2 weeks.  HOME CARE   Only take medicine as told by your doctor.   Use a warm mist humidifier or breathe in steam from a hot shower.   Drink enough water and fluids to keep your pee (urine) clear or pale yellow.   Get plenty of rest.   Return to work when your temperature is back to normal or as told by your doctor. You may use a face mask and wash your hands to stop your cold from spreading.  GET HELP RIGHT AWAY IF:   After the first few days, you feel you are getting worse.   You have questions about your medicine.   You have chills, shortness of breath, or brown or red spit (mucus).   You have yellow or brown snot (nasal discharge) or pain in the face, especially when you bend forward.   You have a fever, puffy (swollen) neck, pain when you swallow, or white spots in the back of your throat.   You have a bad headache, ear pain, sinus pain, or chest pain.   You have a high-pitched whistling sound when you breathe in and out (wheezing).   You have a lasting cough or cough up blood.   You have sore muscles or a stiff neck.  MAKE SURE YOU:   Understand these instructions.   Will watch your condition.   Will get help right away if you are not doing well or get worse.  Document Released: 04/20/2008 Document Revised: 10/22/2011 Document Reviewed: 03/09/2011 [facility] Patient Information 2012 [place], [place].Anemia, Nonspecific Your exam and blood tests show you are anemic. This means your blood (hemoglobin) level is low. Normal hemoglobin values are 12 to 15 g/dL for females and 14 to 17 g/dL for males. Make a note of your hemoglobin level today. The hematocrit percent is  also used to measure anemia. A normal hematocrit is 38% to 46% in females and 42% to 49% in males. Make a note of your hematocrit level today. CAUSES  Anemia can be due to many different causes.  Excessive bleeding from periods (in women).   Intestinal bleeding.   Poor nutrition.   Kidney, thyroid, liver, and bone marrow diseases.  SYMPTOMS  Anemia can come on suddenly (acute). It can also come on slowly. Symptoms can include:  Minor weakness.   Dizziness.   Palpitations.   Shortness of breath.  Symptoms may be absent until half your hemoglobin is missing if it comes on slowly. Anemia due to acute blood loss from an injury or internal bleeding may require blood transfusion if the loss is severe. Hospital care is needed if you are anemic and there is significant continual blood loss. TREATMENT   Stool tests for blood (Hemoccult) and additional lab tests are often needed. This determines the best treatment.   Further checking on your condition and your response to treatment is very important. It often takes many weeks to correct anemia.  Depending on the cause, treatment can include:  Supplements of iron.   Vitamins B12 and folic acid.   Hormone medicines.If your anemia is due to bleeding, finding the cause of the blood loss is very important. This will help  avoid further problems.  SEEK IMMEDIATE MEDICAL CARE IF:   You develop fainting, extreme weakness, shortness of breath, or chest pain.   You develop heavy vaginal bleeding.   You develop bloody or black, tarry stools or vomit up blood.   You develop a high fever, rash, repeated vomiting, or dehydration.  Document Released: 12/10/2004 Document Revised: 10/22/2011 Document Reviewed: 09/17/2009 [facility] Patient Information 2012 [place], [place].

## 2012-02-22 NOTE — Progress Notes (Signed)
Subjective:    Patient ID: Nathan Gomez, male    DOB: 05/11/1941, 71 y.o.   MRN: [id number]  Cough This is a new problem. The current episode started in the past 7 days. The problem has been gradually improving. The problem occurs every few hours. The cough is non-productive. Associated symptoms include chills and rhinorrhea. Pertinent negatives include no chest pain, ear congestion, ear pain, fever, headaches, heartburn, hemoptysis, myalgias, nasal congestion, postnasal drip, rash, sore throat, shortness of breath, sweats, weight loss or wheezing. He has tried OTC cough suppressant for the symptoms. The treatment provided mild relief.  Diabetes He presents for his follow-up diabetic visit. He has type 2 diabetes mellitus. His disease course has been fluctuating. There are no hypoglycemic associated symptoms. Pertinent negatives for hypoglycemia include no confusion, dizziness, headaches, pallor, seizures, speech difficulty, sweats or tremors. Associated symptoms include fatigue and weakness. Pertinent negatives for diabetes include no blurred vision, no chest pain, no foot paresthesias, no foot ulcerations, no polydipsia, no polyphagia, no polyuria, no visual change and no weight loss. There are no hypoglycemic complications. Symptoms are worsening. There are no diabetic complications. Current diabetic treatment includes oral agent (monotherapy). He is compliant with treatment all of the time. His weight is stable. He is following a generally healthy diet. Meal planning includes avoidance of concentrated sweets. He has not had a previous visit with a dietician. He participates in exercise intermittently. There is no change in his home blood glucose trend. An ACE inhibitor/angiotensin II receptor blocker is being taken.  Anemia Presents for follow-up visit. Symptoms include light-headedness and malaise/fatigue. There has been no abdominal pain, anorexia, bruising/bleeding easily, confusion, fever, leg  swelling, pallor, palpitations, paresthesias, pica or weight loss. Signs of blood loss that are not present include hematemesis, hematochezia and melena. There are no compliance problems.       Review of Systems  Constitutional: Positive for chills, malaise/fatigue and fatigue. Negative for fever, weight loss, diaphoresis, activity change, appetite change and unexpected weight change.  HENT: Positive for rhinorrhea. Negative for ear pain, sore throat and postnasal drip.   Eyes: Negative.  Negative for blurred vision.  Respiratory: Positive for cough. Negative for apnea, hemoptysis, choking, chest tightness, shortness of breath and wheezing.   Cardiovascular: Negative for chest pain, palpitations and leg swelling.  Gastrointestinal: Negative for heartburn, nausea, vomiting, abdominal pain, diarrhea, constipation, blood in stool, melena, hematochezia, abdominal distention, anal bleeding, anorexia and hematemesis.  Genitourinary: Negative.  Negative for polyuria.  Musculoskeletal: Negative for myalgias, back pain, joint swelling, arthralgias and gait problem.  Skin: Negative for color change, pallor, rash and wound.  Neurological: Positive for weakness and light-headedness. Negative for dizziness, tremors, seizures, syncope, facial asymmetry, speech difficulty, numbness, headaches and paresthesias.  Hematological: Negative for polydipsia, polyphagia and adenopathy. Does not bruise/bleed easily.  Psychiatric/Behavioral: Negative.  Negative for confusion.       Objective:   Physical Exam  Vitals reviewed. Constitutional: He is oriented to person, place, and time. He appears well-developed and well-nourished. No distress.  HENT:  Head: Normocephalic and atraumatic.  Mouth/Throat: Oropharynx is clear and moist. No oropharyngeal exudate.  Eyes: Conjunctivae are normal. Right eye exhibits no discharge. Left eye exhibits no discharge. No scleral icterus.  Neck: Normal range of motion. Neck supple.  No JVD present. No tracheal deviation present. No thyromegaly present.  Cardiovascular: Normal rate, regular rhythm, normal heart sounds and intact distal pulses.  Exam reveals no gallop and no friction rub.   No murmur heard. Pulmonary/Chest:  Effort normal and breath sounds normal. No stridor. No respiratory distress. He has no wheezes. He has no rales. He exhibits no tenderness.  Abdominal: Soft. Bowel sounds are normal. He exhibits no distension and no mass. There is no tenderness. There is no rebound and no guarding.  Musculoskeletal: Normal range of motion. He exhibits no edema and no tenderness.  Lymphadenopathy:    He has no cervical adenopathy.  Neurological: He is oriented to person, place, and time.  Skin: Skin is warm and dry. No rash noted. He is not diaphoretic. No erythema. No pallor.  Psychiatric: He has a normal mood and affect. His behavior is normal. Judgment and thought content normal.     Lab Results  Component Value Date   WBC 6.9 08/14/2010   HGB 8.9* 08/14/2010   HCT 25.5* 08/14/2010   PLT 140* 08/14/2010   GLUCOSE 126* 08/13/2010   CHOL 120 09/23/2011   TRIG 51.0 09/23/2011   HDL 39.30 09/23/2011   LDLCALC 71 09/23/2011   ALT 16 01/05/2011   AST 22 01/05/2011   NA 135 08/13/2010   K 4.4 08/13/2010   CL 104 08/13/2010   CREATININE 1.3 01/05/2011   BUN 17 08/13/2010   CO2 27 08/13/2010   PSA 3.38 01/05/2011   INR 2.67* 08/14/2010   HGBA1C 6.3 09/23/2011       Assessment & Plan:

## 2012-02-23 DIAGNOSIS — R059 Cough, unspecified: Secondary | ICD-10-CM | POA: Insufficient documentation

## 2012-02-23 DIAGNOSIS — R05 Cough: Secondary | ICD-10-CM | POA: Insufficient documentation

## 2012-02-23 LAB — LIPID PANEL
Cholesterol: 133 mg/dL (ref 0–200)
HDL: 29.6 mg/dL — ABNORMAL LOW (ref >39.00)
Total CHOL/HDL Ratio: 4
Triglycerides: 212 mg/dL — ABNORMAL HIGH (ref 0.0–149.0)
VLDL: 42.4 mg/dL — ABNORMAL HIGH (ref 0.0–40.0)

## 2012-02-23 LAB — FOLATE: Folate: 21.1 ng/mL (ref >5.9)

## 2012-02-23 LAB — IBC PANEL
Iron: 62 ug/dL (ref 42–165)
Saturation Ratios: 19.9 % — ABNORMAL LOW (ref 20.0–50.0)
Transferrin: 222.1 mg/dL (ref 212.0–360.0)

## 2012-02-23 LAB — VITAMIN B12: Vitamin B-12: 349 pg/mL (ref 211–911)

## 2012-02-23 LAB — LDL CHOLESTEROL, DIRECT: Direct LDL: 80.7 mg/dL

## 2012-02-23 LAB — TSH: TSH: 1.58 u[IU]/mL (ref 0.35–5.50)

## 2012-02-23 LAB — CK: Total CK: 135 U/L (ref 7–232)

## 2012-02-23 LAB — FERRITIN: Ferritin: 136.4 ng/mL (ref 22.0–322.0)

## 2012-02-23 NOTE — Assessment & Plan Note (Signed)
I will check his CBC and will look at some vitamin levels as well

## 2012-02-23 NOTE — Assessment & Plan Note (Signed)
His BP is well controlled, I will check his lytes and renal function today 

## 2012-02-23 NOTE — Assessment & Plan Note (Signed)
I will check his a1c and will monitor his renal function 

## 2012-02-23 NOTE — Assessment & Plan Note (Signed)
His CXR is neg for infiltrate so I think this is a viral URI and have not asked him to start antibiotics, if the cough persists then I will stop his ACEI therapy

## 2012-02-23 NOTE — Assessment & Plan Note (Signed)
Will check his CBC B12 and folate levels

## 2012-02-25 ENCOUNTER — Encounter: Payer: Self-pay | Admitting: Internal Medicine

## 2012-02-25 LAB — HEMOGLOBIN A1C: Hgb A1c MFr Bld: 6.7 % — ABNORMAL HIGH (ref 4.6–6.5)

## 2012-03-02 ENCOUNTER — Encounter: Payer: Self-pay | Admitting: Internal Medicine

## 2012-03-02 ENCOUNTER — Ambulatory Visit (INDEPENDENT_AMBULATORY_CARE_PROVIDER_SITE_OTHER): Payer: Medicare Other | Admitting: Internal Medicine

## 2012-03-02 VITALS — BP 100/60 | HR 71 | Temp 98.0°F | Resp 16 | Wt 179.0 lb

## 2012-03-02 DIAGNOSIS — E119 Type 2 diabetes mellitus without complications: Secondary | ICD-10-CM | POA: Diagnosis not present

## 2012-03-02 DIAGNOSIS — E785 Hyperlipidemia, unspecified: Secondary | ICD-10-CM

## 2012-03-02 DIAGNOSIS — J309 Allergic rhinitis, unspecified: Secondary | ICD-10-CM | POA: Diagnosis not present

## 2012-03-02 DIAGNOSIS — R059 Cough, unspecified: Secondary | ICD-10-CM | POA: Diagnosis not present

## 2012-03-02 DIAGNOSIS — R05 Cough: Secondary | ICD-10-CM | POA: Diagnosis not present

## 2012-03-02 MED ORDER — LORATADINE 10 MG PO TABS: 10.0000 mg | ORAL_TABLET | Freq: Every day | ORAL | Status: DC

## 2012-03-02 MED ORDER — AZITHROMYCIN 250 MG PO TABS: ORAL_TABLET | ORAL | Status: AC

## 2012-03-02 MED ORDER — HYDROCODONE-HOMATROPINE 5-1.5 MG/5ML PO SYRP: 5.0000 mL | ORAL_SOLUTION | Freq: Three times a day (TID) | ORAL | Status: AC | PRN

## 2012-03-02 NOTE — Patient Instructions (Signed)
It was good to see you today. We have reviewed your prior records including labs and tests today Start claritin daily for next 7-10 days  To treat allergy and sinus drainage symptoms  - Also Zpak antibiotics and hydromet syrup as discussed Your prescription(s) have been submitted to your pharmacy. Please take as directed and contact our office if you believe you are having problem(s) with the medication(s). Other Medications reviewed, no changes at this time.  Please schedule followup in 6 months, call sooner if problems.

## 2012-03-02 NOTE — Assessment & Plan Note (Signed)
Lab Results  Component Value Date   HGBA1C 6.7* 02/22/2012   Working on weight control as well as medication as ongoing - continue same

## 2012-03-02 NOTE — Assessment & Plan Note (Signed)
Lab Results  Component Value Date   LDLCALC 71 09/23/2011   Continue statin as well as omega 3 and weight control/exercise  

## 2012-03-02 NOTE — Progress Notes (Signed)
Subjective:    Patient ID: Nathan Gomez, male    DOB: 1941/10/25, 71 y.o.   MRN: [id number]  HPI   Here for follow up - reviewed chronic med issues today:  HTN - reports compliance with ongoing medical treatment and no changes in medication dose or frequency. denies adverse side effects related to current therapy.   dyslipidemia - on statin -reports compliance with ongoing medical treatment and no changes in medication dose or frequency. denies adverse side effects related to current therapy.   DM2 - reports compliance with ongoing medical treatment and no changes in medication dose or frequency. denies adverse side effects related to current therapy. checks cbgs 1-2x/day  Also continued cough (OV for "flu" 2 weeks ago) No fever, clear mucus with productive cough  Past Medical History  Diagnosis Date  . Hypertension   . Diabetes mellitus, type 2     Type II  . Dyslipidemia   . Osteoarthritis      Review of Systems  Constitutional: Negative for fever and unexpected weight change.  Respiratory: Positive for cough. Negative for shortness of breath and wheezing.   Neurological: Negative for weakness and headaches.       Objective:   Physical Exam  BP 100/60  Pulse 71  Temp(Src) 98 F (36.7 C) (Oral)  Resp 16  Wt 179 lb (81.194 kg)  SpO2 95% Wt Readings from Last 3 Encounters:  03/02/12 179 lb (81.194 kg)  02/22/12 182 lb (82.555 kg)  09/28/11 182 lb 4 oz (82.668 kg)   Constitutional: He appears well-developed and well-nourished. No distress.  HENT: Normocephalic, mild sinus tenderness bilaterally. Naris with swollen turbinates and clear thick discharge. Oropharynx with cobblestoning and PND, no exudate. TMs with nonobstructing cerumen left greater than right, no effusion or erythema Neck: Normal range of motion. Neck supple. No JVD present. No thyromegaly present.  Cardiovascular: Normal rate, regular rhythm and normal heart sounds.  No murmur heard. no BLE  edema Pulmonary/Chest: Effort normal and breath sounds normal. No respiratory distress. no wheezes.  Neurological: he is alert and oriented to person, place, and time. No cranial nerve deficit. Coordination normal.  Psychiatric: he has a normal mood and affect. behavior is normal. Judgment and thought content normal.   Lab Results  Component Value Date   WBC 4.6 02/22/2012   HGB 13.7 02/22/2012   HCT 41.7 02/22/2012   PLT 182.0 02/22/2012   CHOL 133 02/22/2012   TRIG 212.0* 02/22/2012   HDL 29.60* 02/22/2012   LDLDIRECT 80.7 02/22/2012   ALT 16 01/05/2011   AST 22 01/05/2011   NA 135 08/13/2010   K 4.4 08/13/2010   CL 104 08/13/2010   CREATININE 1.3 01/05/2011   BUN 17 08/13/2010   CO2 27 08/13/2010   TSH 1.58 02/22/2012   PSA 3.38 01/05/2011   INR 2.67* 08/14/2010   HGBA1C 6.7* 02/22/2012   Dg Chest 2 View  02/22/2012  *RADIOLOGY REPORT*  Clinical Data: Cough.  Hypertension.  CHEST - 2 VIEW  Comparison: Two-view chest 08/01/2010.  Findings: The heart size is normal.  The lungs are clear.  Mild rightward curvature of the thoracic spine is stable.  IMPRESSION:  1.  No acute cardiopulmonary disease. 2.  Mild rightward curvature of the thoracic spine is stable.  Original Report Authenticated By: Jamesetta Orleans. MATTERN, M.D.       Assessment & Plan:  See problem list. Medications and labs reviewed today.   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

## 2012-04-27 DIAGNOSIS — H2510 Age-related nuclear cataract, unspecified eye: Secondary | ICD-10-CM | POA: Diagnosis not present

## 2012-05-14 ENCOUNTER — Other Ambulatory Visit: Payer: Self-pay | Admitting: Internal Medicine

## 2012-05-26 ENCOUNTER — Ambulatory Visit (INDEPENDENT_AMBULATORY_CARE_PROVIDER_SITE_OTHER): Payer: Medicare Other | Admitting: Emergency Medicine

## 2012-05-26 VITALS — BP 124/58 | HR 67 | Temp 97.6°F | Resp 18 | Ht 71.0 in | Wt 190.2 lb

## 2012-05-26 DIAGNOSIS — Z23 Encounter for immunization: Secondary | ICD-10-CM | POA: Diagnosis not present

## 2012-05-26 DIAGNOSIS — M79609 Pain in unspecified limb: Secondary | ICD-10-CM

## 2012-05-26 DIAGNOSIS — M79646 Pain in unspecified finger(s): Secondary | ICD-10-CM

## 2012-05-26 DIAGNOSIS — S61209A Unspecified open wound of unspecified finger without damage to nail, initial encounter: Secondary | ICD-10-CM | POA: Diagnosis not present

## 2012-05-26 DIAGNOSIS — S61219A Laceration without foreign body of unspecified finger without damage to nail, initial encounter: Secondary | ICD-10-CM

## 2012-05-26 MED ORDER — TETANUS-DIPHTHERIA TOXOIDS TD 5-2 LFU IM INJ
0.5000 mL | INJECTION | Freq: Once | INTRAMUSCULAR | Status: AC
  Administered 2012-05-26: 0.5 mL via INTRAMUSCULAR

## 2012-05-26 NOTE — Patient Instructions (Addendum)

## 2012-05-26 NOTE — Progress Notes (Signed)
Date:  05/26/2012   Name:  Nathan Gomez   DOB:  [birth year]   MRN:  [id number]  PCP:  Rene Paci, MD    Chief Complaint: Hand Injury   History of Present Illness:  Nathan Gomez is a 71 y.o. very pleasant male patient who presents with the following:  Cutting a package with a knife and injured his right proximal radial index finger.   1.4 cm.  Not current on TT  Patient Active Problem List  Diagnosis  . DIABETES MELLITUS, TYPE II  . DYSLIPIDEMIA  . HYPERTENSION  . OSTEOARTHRITIS  . Anemia  . Pernicious anemia  . Cough   Past Medical History  Diagnosis Date  . Hypertension   . Diabetes mellitus, type 2     Type II  . Dyslipidemia   . Osteoarthritis    Past Surgical History  Procedure Date  . Tonsillectomy 1945  . 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 Smoking status: Former Smoker    Quit date: 11/16/1986  . Smokeless tobacco: Not on file  . Alcohol Use: No   Family History  Problem Relation Age of Onset  . Cancer Mother     Ovarian  . Hypertension Father   . Diabetes Maternal Grandfather   . Pneumonia Paternal Grandfather    No Known Allergies  Medication list has been reviewed and updated.  Current Outpatient Prescriptions on File Prior to Visit  Medication Sig Dispense Refill  . amLODipine (NORVASC) 10 MG tablet TAKE 1 TABLET DAILY  90 tablet  1  . aspirin 81 MG tablet Take 81 mg by mouth daily.        Marland Kitchen atorvastatin (LIPITOR) 40 MG tablet TAKE 1 TABLET AT BEDTIME  90 tablet  1  . hydrochlorothiazide (HYDRODIURIL) 25 MG tablet TAKE 1 TABLET EVERY MORNING  90 tablet  1  . Inositol Niacinate (NIACIN FLUSH FREE) 500 MG CAPS Take 1 capsule by mouth at bedtime.        Marland Kitchen lisinopril (PRINIVIL,ZESTRIL) 20 MG tablet TAKE 1 TABLET TWICE A DAY  180 tablet  1  . loratadine (CLARITIN) 10 MG tablet Take 1 tablet (10 mg total) by mouth daily.      . magnesium oxide (MAG-OX) 400 MG tablet Take 400 mg by mouth 2 (two)  times daily.        . metFORMIN (GLUCOPHAGE-XR) 500 MG 24 hr tablet TAKE 2 TABLETS (1000 MG) TWICE A DAY  360 tablet  1  . Multiple Vitamins-Minerals (SENIOR MULTIVITAMIN PLUS) TABS Take 1 tablet by mouth daily.        Marland Kitchen omeprazole (PRILOSEC) 20 MG capsule Take 20 mg by mouth at bedtime.        Marland Kitchen RA KRILL OIL CAPS Take 1 capsule by mouth daily.        . Cholecalciferol (VITAMIN D) 2000 UNITS CAPS Take 1 capsule by mouth every morning.        . Glucosamine Sulfate POWD by Does not apply route.          Review of Systems:  As per HPI, otherwise negative.    Physical Examination: Filed Vitals:   05/26/12 1918  BP: 124/58  Pulse: 67  Temp: 97.6 F (36.4 C)  Resp: 18   Filed Vitals:   05/26/12 1918  Height: 5\' 11"  (1.803 m)  Weight: 190 lb 3.2 oz (86.274 kg)   Body mass index is 26.53 kg/(m^2). Ideal  Body Weight: Weight in (lb) to have BMI = 25: 178.9    GEN: WDWN, NAD, Non-toxic, Alert & Oriented x 3 HEENT: Atraumatic, Normocephalic.  Ears and Nose: No external deformity. EXTR: No clubbing/cyanosis/edema NEURO: Normal gait.  PSYCH: Normally interactive. Conversant. Not depressed or anxious appearing.  Calm demeanor.  1.4 cm laceration right 2nd finger, NATI  EKG / Labs / Xrays: None available at time of encounter  Assessment and Plan: Suture repair  Tetanus Follow up in one week.  Carmelina Dane, MD

## 2012-05-26 NOTE — Progress Notes (Signed)
Patient ID: HUGHES WYNDHAM MRN: [id number], DOB: [birth year], 71 y.o. Date of Encounter: 05/26/2012, 7:59 PM   PROCEDURE NOTE: Verbal consent obtained. Sterile technique employed. Numbing: Anesthesia obtained with 2% lidocaine plain.  Cleansed with soap and water. Irrigated.  Wound explored, no deep structures involved, no foreign bodies.   Wound repaired with # 4-0 prolene. #5 SI sutures Hemostasis obtained. Wound cleansed and dressed.  Wound care instructions including precautions covered with patient. Handout given.  Anticipate suture removal in 7-10 days  Rhoderick Moody, PA-C 05/26/2012 7:59 PM

## 2012-05-27 ENCOUNTER — Telehealth: Payer: Self-pay

## 2012-05-27 DIAGNOSIS — H2510 Age-related nuclear cataract, unspecified eye: Secondary | ICD-10-CM | POA: Diagnosis not present

## 2012-05-27 DIAGNOSIS — H18419 Arcus senilis, unspecified eye: Secondary | ICD-10-CM | POA: Diagnosis not present

## 2012-05-27 DIAGNOSIS — E119 Type 2 diabetes mellitus without complications: Secondary | ICD-10-CM | POA: Diagnosis not present

## 2012-06-03 ENCOUNTER — Ambulatory Visit (INDEPENDENT_AMBULATORY_CARE_PROVIDER_SITE_OTHER): Payer: Medicare Other | Admitting: Physician Assistant

## 2012-06-03 ENCOUNTER — Encounter: Payer: Self-pay | Admitting: Physician Assistant

## 2012-06-03 VITALS — BP 120/60 | HR 65 | Temp 98.5°F | Resp 16 | Wt 185.0 lb

## 2012-06-03 DIAGNOSIS — S61209A Unspecified open wound of unspecified finger without damage to nail, initial encounter: Secondary | ICD-10-CM

## 2012-06-03 NOTE — Progress Notes (Signed)
HPI: Patient presents for suture removal. Denies pain, erythema, drainage, fever, or chills. Says finger is still slightly swollen but not painful. Sutures placed 7/11  ROS: Negative except HPI  PE: Well healing wound on right index finger. No erythema, warmth, drainage, or warmth.   Sutures removed without difficulty. Patient tolerated well.   A/P: Suture removal - follow up as needed.

## 2012-08-08 DIAGNOSIS — Z23 Encounter for immunization: Secondary | ICD-10-CM | POA: Diagnosis not present

## 2012-08-22 DIAGNOSIS — H2510 Age-related nuclear cataract, unspecified eye: Secondary | ICD-10-CM | POA: Diagnosis not present

## 2012-08-22 DIAGNOSIS — H269 Unspecified cataract: Secondary | ICD-10-CM | POA: Diagnosis not present

## 2012-08-23 DIAGNOSIS — H2510 Age-related nuclear cataract, unspecified eye: Secondary | ICD-10-CM | POA: Diagnosis not present

## 2012-08-29 DIAGNOSIS — H2510 Age-related nuclear cataract, unspecified eye: Secondary | ICD-10-CM | POA: Diagnosis not present

## 2012-09-07 ENCOUNTER — Ambulatory Visit: Payer: Medicare Other | Admitting: Internal Medicine

## 2012-09-16 ENCOUNTER — Encounter: Payer: Self-pay | Admitting: Internal Medicine

## 2012-09-16 ENCOUNTER — Ambulatory Visit (INDEPENDENT_AMBULATORY_CARE_PROVIDER_SITE_OTHER): Payer: Medicare Other | Admitting: Internal Medicine

## 2012-09-16 ENCOUNTER — Other Ambulatory Visit (INDEPENDENT_AMBULATORY_CARE_PROVIDER_SITE_OTHER): Payer: Medicare Other

## 2012-09-16 VITALS — BP 130/72 | HR 68 | Temp 97.8°F | Ht 71.0 in | Wt 188.8 lb

## 2012-09-16 DIAGNOSIS — I10 Essential (primary) hypertension: Secondary | ICD-10-CM

## 2012-09-16 DIAGNOSIS — E119 Type 2 diabetes mellitus without complications: Secondary | ICD-10-CM

## 2012-09-16 DIAGNOSIS — E785 Hyperlipidemia, unspecified: Secondary | ICD-10-CM

## 2012-09-16 DIAGNOSIS — Z0000 Encounter for general adult medical examination without abnormal findings: Secondary | ICD-10-CM | POA: Diagnosis not present

## 2012-09-16 LAB — HEMOGLOBIN A1C: Hgb A1c MFr Bld: 6.3 % (ref 4.6–6.5)

## 2012-09-16 LAB — CREATININE, SERUM: Creatinine, Ser: 1.3 mg/dL (ref 0.4–1.5)

## 2012-09-16 NOTE — Patient Instructions (Signed)
It was good to see you today. We have reviewed your prior records including labs and tests today Test(s) ordered today. Your results will be released to MyChart (or called to you) after review, usually within 72hours after test completion. If any changes need to be made, you will be notified at that same time. Medications reviewed, no changes at this time.  Please schedule followup in 6 months for diabetes mellitus check, call sooner if problems.

## 2012-09-16 NOTE — Progress Notes (Signed)
Subjective:    Patient ID: Nathan Gomez, male    DOB: 1941-10-28, 71 y.o.   MRN: [id number]  HPI  Here for medicare wellness  Diet: heart healthy, diabetic Physical activity: sedentary Depression/mood screen: negative Hearing: intact to whispered voice Visual acuity: grossly normal, performs annual eye exam  ADLs: capable Fall risk: none Home safety: good Cognitive evaluation: intact to orientation, naming, recall and repetition EOL planning: adv directives, full code/ I agree  I have personally reviewed and have noted 1. The patient's medical and social history 2. Their use of alcohol, tobacco or illicit drugs 3. Their current medications and supplements 4. The patient's functional ability including ADL's, fall risks, home safety risks and hearing or visual impairment. 5. Diet and physical activities 6. Evidence for depression or mood disorders  Also reviewed chronic medical issues today:  HTN - reports compliance with ongoing medical treatment and no changes in medication dose or frequency. denies adverse side effects related to current therapy.   dyslipidemia - on statin -reports compliance with ongoing medical treatment and no changes in medication dose or frequency. denies adverse side effects related to current therapy.   DM2 - reports compliance with ongoing medical treatment and no changes in medication dose or frequency. denies adverse side effects related to current therapy. checks cbgs 1-2x/day   Past Medical History  Diagnosis Date  . Hypertension   . Diabetes mellitus, type 2     Type II  . Dyslipidemia   . Osteoarthritis    Family History  Problem Relation Age of Onset  . Cancer Mother     Ovarian  . Hypertension Father   . Diabetes Maternal Grandfather   . Pneumonia Paternal Grandfather    History  Substance Use Topics  . Smoking status: Former Smoker    Quit date: 11/16/1986  . Smokeless tobacco: Not on file  . Alcohol Use: No    Review  of Systems  Constitutional: Negative for fever and unexpected weight change.  Respiratory: Positive for cough. Negative for shortness of breath and wheezing.   Neurological: Negative for weakness and headaches.  Cardiovascular: Negative for chest pain or palpitations.  Gastrointestinal: Negative for abdominal pain, no bowel changes.  Musculoskeletal: Negative for gait problem or joint swelling.  Skin: Negative for rash.  No other specific complaints in a complete review of systems (except as listed in HPI above).      Objective:   Physical Exam  BP 130/72  Pulse 68  Temp 97.8 F (36.6 C) (Oral)  Ht 5\' 11"  (1.803 m)  Wt 188 lb 12.8 oz (85.639 kg)  BMI 26.33 kg/m2  SpO2 96% Wt Readings from Last 3 Encounters:  09/16/12 188 lb 12.8 oz (85.639 kg)  06/03/12 185 lb (83.915 kg)  05/26/12 190 lb 3.2 oz (86.274 kg)   Constitutional: He appears well-developed and well-nourished. No distress.  Neck: Normal range of motion. Neck supple. No JVD present. No thyromegaly present.  Cardiovascular: Normal rate, regular rhythm and normal heart sounds.  No murmur heard. no BLE edema Pulmonary/Chest: Effort normal and breath sounds normal. No respiratory distress. no wheezes.  Neurological: he is alert and oriented to person, place, and time. No cranial nerve deficit. Coordination normal.  Psychiatric: he has a normal mood and affect. behavior is normal. Judgment and thought content normal.   Lab Results  Component Value Date   WBC 4.6 02/22/2012   HGB 13.7 02/22/2012   HCT 41.7 02/22/2012   PLT 182.0 02/22/2012   CHOL  133 02/22/2012   TRIG 212.0* 02/22/2012   HDL 29.60* 02/22/2012   LDLDIRECT 80.7 02/22/2012   ALT 16 01/05/2011   AST 22 01/05/2011   NA 135 08/13/2010   K 4.4 08/13/2010   CL 104 08/13/2010   CREATININE 1.3 01/05/2011   BUN 17 08/13/2010   CO2 27 08/13/2010   TSH 1.58 02/22/2012   PSA 3.38 01/05/2011   INR 2.67* 08/14/2010   HGBA1C 6.7* 02/22/2012         Assessment & Plan:  AWV/v70.0 -  Today patient counseled on age appropriate routine health concerns for screening and prevention, each reviewed and up to date or declined. Immunizations reviewed and up to date or declined. Labs/ECG reviewed. Risk factors for depression reviewed and negative. Hearing function and visual acuity are intact. ADLs screened and addressed as needed. Functional ability and level of safety reviewed and appropriate. Education, counseling and referrals performed based on assessed risks today. Patient provided with a copy of personalized plan for preventive services.  Also see problem list. Medications and labs reviewed today.

## 2012-09-16 NOTE — Assessment & Plan Note (Addendum)
On statin Check annually The current medical regimen is effective;  continue present plan and medications. 

## 2012-09-16 NOTE — Assessment & Plan Note (Signed)
BP Readings from Last 3 Encounters:  09/16/12 130/72  06/03/12 120/60  05/26/12 124/58   The current medical regimen is effective;  continue present plan and medications.

## 2012-09-16 NOTE — Assessment & Plan Note (Signed)
Lab Results  Component Value Date   HGBA1C 6.7* 02/22/2012  on metformin Also statin, ACEI and ASA Working on weight control as well as medication as ongoing - continue same

## 2012-09-19 DIAGNOSIS — H269 Unspecified cataract: Secondary | ICD-10-CM | POA: Diagnosis not present

## 2012-09-19 DIAGNOSIS — H2510 Age-related nuclear cataract, unspecified eye: Secondary | ICD-10-CM | POA: Diagnosis not present

## 2012-09-26 ENCOUNTER — Ambulatory Visit: Payer: Medicare Other | Admitting: Internal Medicine

## 2012-10-20 ENCOUNTER — Other Ambulatory Visit: Payer: Self-pay | Admitting: Internal Medicine

## 2012-11-21 ENCOUNTER — Encounter: Payer: Self-pay | Admitting: Internal Medicine

## 2012-11-28 ENCOUNTER — Encounter: Payer: Self-pay | Admitting: Internal Medicine

## 2012-11-29 ENCOUNTER — Encounter: Payer: Self-pay | Admitting: Internal Medicine

## 2012-12-06 ENCOUNTER — Other Ambulatory Visit: Payer: Self-pay | Admitting: *Deleted

## 2012-12-06 MED ORDER — HYDROCHLOROTHIAZIDE 25 MG PO TABS: 25.0000 mg | ORAL_TABLET | Freq: Every day | ORAL | Status: DC

## 2012-12-06 MED ORDER — LISINOPRIL 20 MG PO TABS: 20.0000 mg | ORAL_TABLET | Freq: Two times a day (BID) | ORAL | Status: DC

## 2012-12-06 MED ORDER — METFORMIN HCL ER 500 MG PO TB24: 1000.0000 mg | ORAL_TABLET | Freq: Two times a day (BID) | ORAL | Status: DC

## 2012-12-06 MED ORDER — AMLODIPINE BESYLATE 10 MG PO TABS: 10.0000 mg | ORAL_TABLET | Freq: Every day | ORAL | Status: DC

## 2012-12-06 MED ORDER — OMEPRAZOLE 20 MG PO CPDR: 20.0000 mg | DELAYED_RELEASE_CAPSULE | Freq: Every day | ORAL | Status: DC

## 2012-12-06 MED ORDER — ATORVASTATIN CALCIUM 40 MG PO TABS: 40.0000 mg | ORAL_TABLET | Freq: Every day | ORAL | Status: DC

## 2012-12-06 NOTE — Telephone Encounter (Signed)
Received e-mail pt needing all new rx sent to new mail service [facility]....Raechel Chute

## 2012-12-26 ENCOUNTER — Ambulatory Visit (AMBULATORY_SURGERY_CENTER): Payer: Medicare Other | Admitting: *Deleted

## 2012-12-26 VITALS — Ht 72.0 in | Wt 190.0 lb

## 2012-12-26 DIAGNOSIS — Z1211 Encounter for screening for malignant neoplasm of colon: Secondary | ICD-10-CM

## 2012-12-26 MED ORDER — SUPREP BOWEL PREP KIT 17.5-3.13-1.6 GM/177ML PO SOLN: ORAL | Status: DC

## 2013-01-09 ENCOUNTER — Other Ambulatory Visit: Payer: Self-pay | Admitting: Internal Medicine

## 2013-01-09 ENCOUNTER — Encounter: Payer: Self-pay | Admitting: Internal Medicine

## 2013-01-09 ENCOUNTER — Ambulatory Visit (AMBULATORY_SURGERY_CENTER): Payer: Medicare Other | Admitting: Internal Medicine

## 2013-01-09 VITALS — BP 112/72 | HR 69 | Temp 96.8°F | Resp 16 | Ht 72.0 in | Wt 190.0 lb

## 2013-01-09 DIAGNOSIS — D649 Anemia, unspecified: Secondary | ICD-10-CM | POA: Diagnosis not present

## 2013-01-09 DIAGNOSIS — Z1211 Encounter for screening for malignant neoplasm of colon: Secondary | ICD-10-CM

## 2013-01-09 DIAGNOSIS — E119 Type 2 diabetes mellitus without complications: Secondary | ICD-10-CM | POA: Diagnosis not present

## 2013-01-09 DIAGNOSIS — I10 Essential (primary) hypertension: Secondary | ICD-10-CM | POA: Diagnosis not present

## 2013-01-09 DIAGNOSIS — D126 Benign neoplasm of colon, unspecified: Secondary | ICD-10-CM | POA: Diagnosis not present

## 2013-01-09 DIAGNOSIS — K648 Other hemorrhoids: Secondary | ICD-10-CM

## 2013-01-09 MED ORDER — SODIUM CHLORIDE 0.9 % IV SOLN: 500.0000 mL | INTRAVENOUS | Status: DC

## 2013-01-09 NOTE — Progress Notes (Signed)
Patient did not experience any of the following events: a burn prior to discharge; a fall within the facility; wrong site/side/patient/procedure/implant event; or a hospital transfer or hospital admission upon discharge from the facility. (G8907) Patient did not have preoperative order for IV antibiotic SSI prophylaxis. (G8918)  

## 2013-01-09 NOTE — Op Note (Signed)
[facility] [address] [place], [address]   COLONOSCOPY PROCEDURE REPORT  PATIENT: Nathan Gomez, Nathan Gomez  MR#: [id number] BIRTHDATE: 02-18-1941 , 71  yrs. old GENDER: Male ENDOSCOPIST: Iva Boop, MD, [facility] PROCEDURE DATE:  01/09/2013 PROCEDURE:   Colonoscopy with biopsy ASA CLASS:   Class III INDICATIONS:average risk screening. MEDICATIONS: propofol (Diprivan) 300mg  IV, MAC sedation, administered by CRNA, and These medications were titrated to patient response per physician's verbal order  DESCRIPTION OF PROCEDURE:   After the risks benefits and alternatives of the procedure were thoroughly explained, informed consent was obtained.  A digital rectal exam revealed no abnormalities of the rectum, A digital rectal exam revealed no prostatic nodules, and A digital rectal exam revealed the prostate was not enlarged.   The LB [id number]  endoscope was introduced through the anus and advanced to the cecum, which was identified by both the appendix and ileocecal valve. No adverse events experienced.   The quality of the prep was Suprep excellent The instrument was then slowly withdrawn as the colon was fully examined.      COLON FINDINGS: Two polypoid shaped sessile polyps measuring 2-3 mm in size were found in the distal transverse colon and descending colon.  A polypectomy was performed with cold forceps.  The resection was complete and the polyp tissue was completely retrieved.   Small internal hemorrhoids were found.   The colon mucosa was otherwise normal.   A right colon retroflexion was performed.  Retroflexed rectal views revealed internal hemorrhoids. The time to cecum=2 minutes 32 seconds.  Withdrawal time=9 minutes 55 seconds.  The scope was withdrawn and the procedure completed. COMPLICATIONS: There were no complications.  ENDOSCOPIC IMPRESSION: 1.   Two sessile polyps measuring 2-3 mm in size were found in the distal transverse colon and  descending colon; polypectomy was performed with cold forceps 2.   Small internal hemorrhoids 3.   The colon mucosa was otherwise normal w/ excellent prep  RECOMMENDATIONS: Await pathology results - may not need another routine colonoscopy   eSigned:  Iva Boop, MD, [facility] 01/09/2013 9:22 AM cc: The Patient and Newt Lukes, MD

## 2013-01-09 NOTE — Patient Instructions (Addendum)
Two tiny polyps were removed - they should be benign and not an issue. You also have small internal hemorrhoids. Otherwise normal with great prep.  I will let you know pathology results and when/if to have another routine colonoscopy by mail.  Thank you for choosing me and [facility].  Iva Boop, MD, [facility]  See handouts given on polyps & hemorrhoids. Resume current medications today. Call us with any questions or concerns. Thank you!!  YOU HAD AN ENDOSCOPIC PROCEDURE TODAY AT THE [facility]: Refer to the procedure report that was given to you for any specific questions about what was found during the examination.  If the procedure report does not answer your questions, please call your gastroenterologist to clarify.  If you requested that your care partner not be given the details of your procedure findings, then the procedure report has been included in a sealed envelope for you to review at your convenience later.  YOU SHOULD EXPECT: Some feelings of bloating in the abdomen. Passage of more gas than usual.  Walking can help get rid of the air that was put into your GI tract during the procedure and reduce the bloating. If you had a lower endoscopy (such as a colonoscopy or flexible sigmoidoscopy) you may notice spotting of blood in your stool or on the toilet paper. If you underwent a bowel prep for your procedure, then you may not have a normal bowel movement for a few days.  DIET: Your first meal following the procedure should be a light meal and then it is ok to progress to your normal diet.  A half-sandwich or bowl of soup is an example of a good first meal.  Heavy or fried foods are harder to digest and may make you feel nauseous or bloated.  Likewise meals heavy in dairy and vegetables can cause extra gas to form and this can also increase the bloating.  Drink plenty of fluids but you should avoid alcoholic beverages for 24 hours.  ACTIVITY: Your care  partner should take you home directly after the procedure.  You should plan to take it easy, moving slowly for the rest of the day.  You can resume normal activity the day after the procedure however you should NOT DRIVE or use heavy machinery for 24 hours (because of the sedation medicines used during the test).    SYMPTOMS TO REPORT IMMEDIATELY: A gastroenterologist can be reached at any hour.  During normal business hours, 8:30 AM to 5:00 PM Monday through Friday, call [phone].  After hours and on weekends, please call the GI answering service at [phone] who will take a message and have the physician on call contact you.   Following lower endoscopy (colonoscopy or flexible sigmoidoscopy):  Excessive amounts of blood in the stool  Significant tenderness or worsening of abdominal pains  Swelling of the abdomen that is new, acute  Fever of 100F or higher  Following upper endoscopy (EGD)  Vomiting of blood or coffee ground material  New chest pain or pain under the shoulder blades  Painful or persistently difficult swallowing  New shortness of breath  Fever of 100F or higher  Black, tarry-looking stools  FOLLOW UP: If any biopsies were taken you will be contacted by phone or by letter within the next 1-3 weeks.  Call your gastroenterologist if you have not heard about the biopsies in 3 weeks.  Our staff will call the home number listed on your records the next business  day following your procedure to check on you and address any questions or concerns that you may have at that time regarding the information given to you following your procedure. This is a courtesy call and so if there is no answer at the home number and we have not heard from you through the emergency physician on call, we will assume that you have returned to your regular daily activities without incident.  SIGNATURES/CONFIDENTIALITY: You and/or your care partner have signed paperwork which will be entered  into your electronic medical record.  These signatures attest to the fact that that the information above on your After Visit Summary has been reviewed and is understood.  Full responsibility of the confidentiality of this discharge information lies with you and/or your care-partner.

## 2013-01-09 NOTE — Progress Notes (Signed)
Called to room to assist during endoscopic procedure.  Patient ID and intended procedure confirmed with present staff. Received instructions for my participation in the procedure from the performing physician.  

## 2013-01-10 ENCOUNTER — Telehealth: Payer: Self-pay | Admitting: *Deleted

## 2013-01-10 NOTE — Telephone Encounter (Signed)
  Follow up Call-  Call back number 01/09/2013  Post procedure Call Back phone  # [phone]  Permission to leave phone message Yes     Patient questions:  Do you have a fever, pain , or abdominal swelling? no Pain Score  0 *  Have you tolerated food without any problems? yes  Have you been able to return to your normal activities? yes  Do you have any questions about your discharge instructions: Diet   no Medications  no Follow up visit  no  Do you have questions or concerns about your Care? no  Actions: * If pain score is 4 or above: No action needed, pain <4.

## 2013-01-16 ENCOUNTER — Encounter: Payer: Self-pay | Admitting: Internal Medicine

## 2013-01-16 DIAGNOSIS — Z860100 Personal history of colon polyps, unspecified: Secondary | ICD-10-CM

## 2013-01-16 HISTORY — DX: Personal history of colonic polyps: Z86.010

## 2013-01-16 HISTORY — DX: Personal history of colon polyps, unspecified: Z86.0100

## 2013-01-16 NOTE — Progress Notes (Signed)
Quick Note:  2 diminutive adenomas Consider repeat at 3 years (age 72)   ______

## 2013-02-15 DIAGNOSIS — M1710 Unilateral primary osteoarthritis, unspecified knee: Secondary | ICD-10-CM | POA: Diagnosis not present

## 2013-02-27 DIAGNOSIS — M25569 Pain in unspecified knee: Secondary | ICD-10-CM | POA: Diagnosis not present

## 2013-02-27 DIAGNOSIS — M1710 Unilateral primary osteoarthritis, unspecified knee: Secondary | ICD-10-CM | POA: Diagnosis not present

## 2013-02-27 DIAGNOSIS — M25559 Pain in unspecified hip: Secondary | ICD-10-CM | POA: Diagnosis not present

## 2013-03-16 LAB — HM DIABETES EYE EXAM

## 2013-03-22 ENCOUNTER — Ambulatory Visit: Payer: Self-pay | Admitting: Internal Medicine

## 2013-03-22 ENCOUNTER — Ambulatory Visit: Payer: Medicare Other | Admitting: Internal Medicine

## 2013-03-24 LAB — HM DIABETES EYE EXAM

## 2013-04-03 DIAGNOSIS — H35039 Hypertensive retinopathy, unspecified eye: Secondary | ICD-10-CM | POA: Diagnosis not present

## 2013-04-03 DIAGNOSIS — E119 Type 2 diabetes mellitus without complications: Secondary | ICD-10-CM | POA: Diagnosis not present

## 2013-05-26 DIAGNOSIS — M25569 Pain in unspecified knee: Secondary | ICD-10-CM | POA: Diagnosis not present

## 2013-05-26 DIAGNOSIS — M25559 Pain in unspecified hip: Secondary | ICD-10-CM | POA: Diagnosis not present

## 2013-06-02 ENCOUNTER — Encounter: Payer: Self-pay | Admitting: Internal Medicine

## 2013-06-02 ENCOUNTER — Other Ambulatory Visit (INDEPENDENT_AMBULATORY_CARE_PROVIDER_SITE_OTHER): Payer: Medicare Other

## 2013-06-02 ENCOUNTER — Ambulatory Visit (INDEPENDENT_AMBULATORY_CARE_PROVIDER_SITE_OTHER): Payer: Medicare Other | Admitting: Internal Medicine

## 2013-06-02 VITALS — BP 138/72 | HR 62 | Temp 97.5°F | Wt 185.6 lb

## 2013-06-02 DIAGNOSIS — E119 Type 2 diabetes mellitus without complications: Secondary | ICD-10-CM

## 2013-06-02 DIAGNOSIS — M1990 Unspecified osteoarthritis, unspecified site: Secondary | ICD-10-CM | POA: Diagnosis not present

## 2013-06-02 DIAGNOSIS — E785 Hyperlipidemia, unspecified: Secondary | ICD-10-CM

## 2013-06-02 DIAGNOSIS — I10 Essential (primary) hypertension: Secondary | ICD-10-CM

## 2013-06-02 LAB — BASIC METABOLIC PANEL
BUN: 33 mg/dL — ABNORMAL HIGH (ref 6–23)
CO2: 25 mEq/L (ref 19–32)
Calcium: 9.9 mg/dL (ref 8.4–10.5)
Chloride: 103 mEq/L (ref 96–112)
Creatinine, Ser: 1.5 mg/dL (ref 0.4–1.5)
GFR: 50.55 mL/min — ABNORMAL LOW (ref >60.00)
Glucose, Bld: 128 mg/dL — ABNORMAL HIGH (ref 70–99)
Potassium: 5.3 mEq/L — ABNORMAL HIGH (ref 3.5–5.1)
Sodium: 138 mEq/L (ref 135–145)

## 2013-06-02 LAB — LIPID PANEL
Cholesterol: 175 mg/dL (ref 0–200)
HDL: 45.9 mg/dL (ref >39.00)
LDL Cholesterol: 107 mg/dL — ABNORMAL HIGH (ref 0–99)
Total CHOL/HDL Ratio: 4
Triglycerides: 110 mg/dL (ref 0.0–149.0)
VLDL: 22 mg/dL (ref 0.0–40.0)

## 2013-06-02 LAB — HEMOGLOBIN A1C: Hgb A1c MFr Bld: 6.8 % — ABNORMAL HIGH (ref 4.6–6.5)

## 2013-06-02 LAB — MICROALBUMIN / CREATININE URINE RATIO
Creatinine,U: 134 mg/dL
Microalb Creat Ratio: 0.9 mg/g (ref 0.0–30.0)
Microalb, Ur: 1.2 mg/dL (ref 0.0–1.9)

## 2013-06-02 NOTE — Assessment & Plan Note (Signed)
Lab Results  Component Value Date   HGBA1C 6.3 09/16/2012  on metformin Also statin, ACEI and ASA Check a1c and microalb Follows with optho annually - Working on weight control as well as medication as ongoing - continue same

## 2013-06-02 NOTE — Assessment & Plan Note (Signed)
BP Readings from Last 3 Encounters:  06/02/13 138/72  01/09/13 112/72  09/16/12 130/72   The current medical regimen is effective;  continue present plan and medications.

## 2013-06-02 NOTE — Assessment & Plan Note (Signed)
On statin Check annually The current medical regimen is effective;  continue present plan and medications. 

## 2013-06-02 NOTE — Patient Instructions (Signed)
It was good to see you today. We have reviewed your prior records including labs and tests today. Medications reviewed and updated, no changes recommended at this time. Test(s) ordered today. Your results will be released to MyChart (or called to you) after review, usually within 72hours after test completion. If any changes need to be made, you will be notified at that same time. Please schedule followup in 6 months for your annual exam and labs, call sooner if problems.

## 2013-06-02 NOTE — Assessment & Plan Note (Signed)
Increasing problems with right knee reviewed Last flare late spring 2014 seen by orthopedic specialist while in [place] who suggested partial knee replacement for medial compartment dz Now following with local orthopedics Dr. Jerl Santos -2 cortisone injections with improvement, but not resolved Considering surgical options, question total knee replacement versus partial Patient considering second opinion but does not feel strongly need for referral at this time No nocturnal pain, no current joint swelling Continue management as ongoing, facial let us know if assistance needed with orthopedic selection

## 2013-06-02 NOTE — Progress Notes (Signed)
  Subjective:    Patient ID: Nathan Gomez, male    DOB: 1941/07/12, 72 y.o.   MRN: [id number]  HPI  Here for follow up - reviewed chronic medical issues today:  HTN - reports compliance with ongoing medical treatment and no changes in medication dose or frequency. denies adverse side effects related to current therapy.   dyslipidemia - on statin -reports compliance with ongoing medical treatment and no changes in medication dose or frequency. denies adverse side effects related to current therapy.   DM2 - reports compliance with ongoing medical treatment and no changes in medication dose or frequency. denies adverse side effects related to current therapy. checks cbgs 1-2x/day   Past Medical History  Diagnosis Date  . Hypertension   . Diabetes mellitus, type 2     Type II  . Dyslipidemia   . Osteoarthritis   . GERD (gastroesophageal reflux disease)   . Personal history of colonic adenomas 01/16/2013    Review of Systems  Constitutional: Negative for fever and unexpected weight change.  Respiratory: Positive for cough. Negative for shortness of breath and wheezing.   Neurological: Negative for weakness and headaches.      Objective:   Physical Exam  BP 138/72  Pulse 62  Temp(Src) 97.5 F (36.4 C) (Oral)  Wt 185 lb 9.6 oz (84.188 kg)  BMI 25.17 kg/m2  SpO2 98% Wt Readings from Last 3 Encounters:  06/02/13 185 lb 9.6 oz (84.188 kg)  01/09/13 190 lb (86.183 kg)  12/26/12 190 lb (86.183 kg)   Constitutional: He appears well-developed and well-nourished. No distress.  Neck: Normal range of motion. Neck supple. No JVD present. No thyromegaly present.  Cardiovascular: Normal rate, regular rhythm and normal heart sounds.  No murmur heard. no BLE edema Pulmonary/Chest: Effort normal and breath sounds normal. No respiratory distress. no wheezes.  Neurological: he is alert and oriented to person, place, and time. No cranial nerve deficit. Coordination normal.  Psychiatric: he  has a normal mood and affect. behavior is normal. Judgment and thought content normal.   Lab Results  Component Value Date   WBC 4.6 02/22/2012   HGB 13.7 02/22/2012   HCT 41.7 02/22/2012   PLT 182.0 02/22/2012   CHOL 133 02/22/2012   TRIG 212.0* 02/22/2012   HDL 29.60* 02/22/2012   LDLDIRECT 80.7 02/22/2012   ALT 16 01/05/2011   AST 22 01/05/2011   NA 135 08/13/2010   K 4.4 08/13/2010   CL 104 08/13/2010   CREATININE 1.3 09/16/2012   BUN 17 08/13/2010   CO2 27 08/13/2010   TSH 1.58 02/22/2012   PSA 3.38 01/05/2011   INR 2.67* 08/14/2010   HGBA1C 6.3 09/16/2012         Assessment & Plan:    see problem list. Medications and labs reviewed today.

## 2013-08-31 DIAGNOSIS — Z23 Encounter for immunization: Secondary | ICD-10-CM | POA: Diagnosis not present

## 2013-09-21 ENCOUNTER — Other Ambulatory Visit: Payer: Self-pay

## 2013-09-29 DIAGNOSIS — M25569 Pain in unspecified knee: Secondary | ICD-10-CM | POA: Diagnosis not present

## 2013-10-04 DIAGNOSIS — M25559 Pain in unspecified hip: Secondary | ICD-10-CM | POA: Diagnosis not present

## 2013-10-18 DIAGNOSIS — M25559 Pain in unspecified hip: Secondary | ICD-10-CM | POA: Diagnosis not present

## 2013-10-18 DIAGNOSIS — M25569 Pain in unspecified knee: Secondary | ICD-10-CM | POA: Diagnosis not present

## 2013-10-23 DIAGNOSIS — M25559 Pain in unspecified hip: Secondary | ICD-10-CM | POA: Diagnosis not present

## 2013-10-23 DIAGNOSIS — M25569 Pain in unspecified knee: Secondary | ICD-10-CM | POA: Diagnosis not present

## 2013-10-27 DIAGNOSIS — M25569 Pain in unspecified knee: Secondary | ICD-10-CM | POA: Diagnosis not present

## 2013-10-27 DIAGNOSIS — M545 Low back pain, unspecified: Secondary | ICD-10-CM | POA: Diagnosis not present

## 2013-11-29 DIAGNOSIS — M25569 Pain in unspecified knee: Secondary | ICD-10-CM | POA: Diagnosis not present

## 2013-12-05 ENCOUNTER — Encounter: Payer: Self-pay | Admitting: Internal Medicine

## 2013-12-05 ENCOUNTER — Other Ambulatory Visit (INDEPENDENT_AMBULATORY_CARE_PROVIDER_SITE_OTHER): Payer: Medicare Other

## 2013-12-05 ENCOUNTER — Ambulatory Visit (INDEPENDENT_AMBULATORY_CARE_PROVIDER_SITE_OTHER): Payer: Medicare Other | Admitting: Internal Medicine

## 2013-12-05 VITALS — BP 130/80 | HR 66 | Temp 98.3°F | Ht 71.0 in | Wt 190.1 lb

## 2013-12-05 DIAGNOSIS — E119 Type 2 diabetes mellitus without complications: Secondary | ICD-10-CM

## 2013-12-05 DIAGNOSIS — H6122 Impacted cerumen, left ear: Secondary | ICD-10-CM

## 2013-12-05 DIAGNOSIS — H6120 Impacted cerumen, unspecified ear: Secondary | ICD-10-CM | POA: Diagnosis not present

## 2013-12-05 DIAGNOSIS — Z0000 Encounter for general adult medical examination without abnormal findings: Secondary | ICD-10-CM

## 2013-12-05 DIAGNOSIS — I10 Essential (primary) hypertension: Secondary | ICD-10-CM

## 2013-12-05 DIAGNOSIS — E785 Hyperlipidemia, unspecified: Secondary | ICD-10-CM

## 2013-12-05 LAB — BASIC METABOLIC PANEL
BUN: 30 mg/dL — ABNORMAL HIGH (ref 6–23)
CO2: 27 mEq/L (ref 19–32)
Calcium: 9.3 mg/dL (ref 8.4–10.5)
Chloride: 106 mEq/L (ref 96–112)
Creatinine, Ser: 1.4 mg/dL (ref 0.4–1.5)
GFR: 51.71 mL/min — ABNORMAL LOW (ref >60.00)
Glucose, Bld: 133 mg/dL — ABNORMAL HIGH (ref 70–99)
Potassium: 4.9 mEq/L (ref 3.5–5.1)
Sodium: 140 mEq/L (ref 135–145)

## 2013-12-05 LAB — LIPID PANEL
Cholesterol: 142 mg/dL (ref 0–200)
HDL: 39.3 mg/dL (ref >39.00)
LDL Cholesterol: 83 mg/dL (ref 0–99)
Total CHOL/HDL Ratio: 4
Triglycerides: 97 mg/dL (ref 0.0–149.0)
VLDL: 19.4 mg/dL (ref 0.0–40.0)

## 2013-12-05 LAB — HEPATIC FUNCTION PANEL
ALT: 22 U/L (ref 0–53)
AST: 18 U/L (ref 0–37)
Albumin: 4.1 g/dL (ref 3.5–5.2)
Alkaline Phosphatase: 82 U/L (ref 39–117)
Bilirubin, Direct: 0.1 mg/dL (ref 0.0–0.3)
Total Bilirubin: 0.6 mg/dL (ref 0.3–1.2)
Total Protein: 7.1 g/dL (ref 6.0–8.3)

## 2013-12-05 LAB — HEMOGLOBIN A1C: Hgb A1c MFr Bld: 6.8 % — ABNORMAL HIGH (ref 4.6–6.5)

## 2013-12-05 MED ORDER — ATORVASTATIN CALCIUM 40 MG PO TABS: 20.0000 mg | ORAL_TABLET | Freq: Every day | ORAL | Status: DC

## 2013-12-05 MED ORDER — TADALAFIL 20 MG PO TABS: 10.0000 mg | ORAL_TABLET | ORAL | Status: DC | PRN

## 2013-12-05 NOTE — Assessment & Plan Note (Signed)
On statin Check annually The current medical regimen is effective;  continue present plan and medications. 

## 2013-12-05 NOTE — Progress Notes (Signed)
Subjective:    Patient ID: Nathan Gomez, male    DOB: 12-Jan-1941, 73 y.o.   MRN: [id number]  HPI   Here for medicare wellness  Diet: heart healthy or DM if diabetic Physical activity: sedentary Depression/mood screen: negative Hearing: intact to whispered voice Visual acuity: grossly normal, performs annual eye exam  ADLs: capable Fall risk: none Home safety: good Cognitive evaluation: intact to orientation, naming, recall and repetition EOL planning: adv directives, full code/ I agree  I have personally reviewed and have noted 1. The patient's medical and social history 2. Their use of alcohol, tobacco or illicit drugs 3. Their current medications and supplements 4. The patient's functional ability including ADL's, fall risks, home safety risks and hearing or visual impairment. 5. Diet and physical activities 6. Evidence for depression or mood disorders  Also reviewed chronic medical issues: DM2, dyslipidemia, hyperlipidemia  Past Medical History  Diagnosis Date  . Hypertension   . Diabetes mellitus, type 2     Type II  . Dyslipidemia   . Osteoarthritis   . GERD (gastroesophageal reflux disease)   . Personal history of colonic adenomas 01/16/2013   Family History  Problem Relation Age of Onset  . Cancer Mother     Ovarian  . Hypertension Father   . Diabetes Maternal Grandfather   . Pneumonia Paternal Grandfather    History  Substance Use Topics  . Smoking status: Former Smoker    Quit date: 11/16/1986  . Smokeless tobacco: Never Used  . Alcohol Use: 1.8 oz/week    3 Glasses of wine per week    Review of Systems  Constitutional: Negative for fever, activity change, appetite change, fatigue and unexpected weight change.  HENT: Positive for hearing loss (on L x 3 mo). Negative for dental problem and ear pain.   Respiratory: Negative for cough, chest tightness, shortness of breath and wheezing.   Cardiovascular: Negative for chest pain, palpitations and  leg swelling.  Neurological: Negative for dizziness, weakness and headaches.  Psychiatric/Behavioral: Negative for dysphoric mood. The patient is not nervous/anxious.   All other systems reviewed and are negative.       Objective:   Physical Exam BP 130/80  Pulse 66  Temp(Src) 98.3 F (36.8 C) (Oral)  Ht 5\' 11"  (1.803 m)  Wt 190 lb 1.9 oz (86.238 kg)  BMI 26.53 kg/m2  SpO2 96% Wt Readings from Last 3 Encounters:  12/05/13 190 lb 1.9 oz (86.238 kg)  06/02/13 185 lb 9.6 oz (84.188 kg)  01/09/13 190 lb (86.183 kg)   Constitutional: he appears well-developed and well-nourished. No distress.  HENT: Head: Normocephalic and atraumatic. Ears: left tympanic membrane initially obscured by cerumen, after irrigation, B TMs ok, no erythema or effusion; Nose: Nose normal. Mouth/Throat: Oropharynx is clear and moist. No oropharyngeal exudate.  Eyes: Conjunctivae and EOM are normal. Pupils are equal, round, and reactive to light. No scleral icterus.  Neck: Normal range of motion. Neck supple. No JVD present. No thyromegaly present.  Cardiovascular: Normal rate, regular rhythm and normal heart sounds.  No murmur heard. No BLE edema. Pulmonary/Chest: Effort normal and breath sounds normal. No respiratory distress. he has no wheezes.  Abdominal: Soft. Bowel sounds are normal. he exhibits no distension. There is no tenderness. no masses Musculoskeletal: Normal range of motion, no joint effusions. No gross deformities Neurological: he is alert and oriented to person, place, and time. No cranial nerve deficit. Coordination, balance, strength, speech and gait are normal.  Skin: Skin is warm  and dry. No rash noted. No erythema.  Psychiatric: he has a normal mood and affect. behavior is normal. Judgment and thought content normal.  Lab Results  Component Value Date   WBC 4.6 02/22/2012   HGB 13.7 02/22/2012   HCT 41.7 02/22/2012   PLT 182.0 02/22/2012   GLUCOSE 128* 06/02/2013   CHOL 175 06/02/2013   TRIG  110.0 06/02/2013   HDL 45.90 06/02/2013   LDLDIRECT 80.7 02/22/2012   LDLCALC 107* 06/02/2013   ALT 16 01/05/2011   AST 22 01/05/2011   NA 138 06/02/2013   K 5.3* 06/02/2013   CL 103 06/02/2013   CREATININE 1.5 06/02/2013   BUN 33* 06/02/2013   CO2 25 06/02/2013   TSH 1.58 02/22/2012   PSA 3.38 01/05/2011   INR 2.67* 08/14/2010   HGBA1C 6.8* 06/02/2013   MICROALBUR 1.2 06/02/2013   Procedure: wax removal Reason: wax impaction Risks and benefits of procedure discussed with the patient who agrees to proceed. Ear(s) irrigated with warm water. Large amount of wax removed. Instrumentation with metal ear loop was performed to accomplish wax removal. the patient tolerated procedure well.     Assessment & Plan:   AWV/v70.0 - Today patient counseled on age appropriate routine health concerns for screening and prevention, each reviewed and up to date or declined. Immunizations reviewed and up to date or declined. Labs ordered and reviewed. Risk factors for depression reviewed and negative. Hearing function and visual acuity are intact. ADLs screened and addressed as needed. Functional ability and level of safety reviewed and appropriate. Education, counseling and referrals performed based on assessed risks today. Patient provided with a copy of personalized plan for preventive services.  Left cerumen impaction. Irrigation performed today. See note above. Patient to call if any problems for audiology referral as needed  Erectile dysfunction. Periodic problem without other concerning features on history. We'll prescribe Cialis to use as needed per patient request. Patient will contact us if problems  Also See problem list. Medications and labs reviewed today.

## 2013-12-05 NOTE — Assessment & Plan Note (Signed)
Lab Results  Component Value Date   HGBA1C 6.8* 06/02/2013  on metformin Also statin, ACEI and ASA Check a1c q75mo and microalb annually Follows with optho annually - Working on weight control as well as medication as ongoing - continue same

## 2013-12-05 NOTE — Addendum Note (Signed)
Addended by: Gwendolyn Grant A on: 12/05/2013 12:47 PM   Modules accepted: Orders

## 2013-12-05 NOTE — Progress Notes (Signed)
Pre-visit discussion using our clinic review tool. No additional management support is needed unless otherwise documented below in the visit note.  

## 2013-12-05 NOTE — Assessment & Plan Note (Signed)
BP Readings from Last 3 Encounters:  12/05/13 130/80  06/02/13 138/72  01/09/13 112/72   The current medical regimen is effective;  continue present plan and medications.

## 2013-12-05 NOTE — Patient Instructions (Addendum)
It was good to see you today.  We have reviewed your prior records including labs and tests today  Health Maintenance reviewed - all recommended immunizations and age-appropriate screenings are up-to-date.  Test(s) ordered today. Your results will be released to [place] (or called to you) after review, usually within 72hours after test completion. If any changes need to be made, you will be notified at that same time.  we'll make referral her carotid ultrasound to evaluate blood in your neck to your head. Our office will contact you regarding appointment(s) once made.  Medications reviewed and updated, no changes recommended at this time. Okay to use Cialis as needed Your prescription(s) have been submitted to your pharmacy. Please take as directed and contact our office if you believe you are having problem(s) with the medication(s).  Work on lifestyle changes as discussed (low fat, low carb, increased protein diet; improved exercise efforts; weight loss) to control sugar, blood pressure and cholesterol levels and/or reduce risk of developing other medical problems. Look into http://vang.[email or web address] or other type of food journal to assist you in this process.  Your ears have been irrigated of wax today -let us know if continued hearing problems persist for referral to audiologist and hearing testing  Please schedule followup in 6 months for DM check, call sooner if problems.    Health Maintenance, Males A healthy lifestyle and preventative care can promote health and wellness.  Maintain regular health, dental, and eye exams.  Eat a healthy diet. Foods like vegetables, fruits, whole grains, low-fat dairy products, and lean protein foods contain the nutrients you need and are low in calories. Decrease your intake of foods high in solid fats, added sugars, and salt. Get information about a proper diet from your health care provider, if necessary.  Regular physical exercise is one of the most  important things you can do for your health. Most adults should get at least 150 minutes of moderate-intensity exercise (any activity that increases your heart rate and causes you to sweat) each week. In addition, most adults need muscle-strengthening exercises on 2 or more days a week.   Maintain a healthy weight. The body mass index (BMI) is a screening tool to identify possible weight problems. It provides an estimate of body fat based on height and weight. Your health care provider can find your BMI and can help you achieve or maintain a healthy weight. For males 20 years and older:  A BMI below 18.5 is considered underweight.  A BMI of 18.5 to 24.9 is normal.  A BMI of 25 to 29.9 is considered overweight.  A BMI of 30 and above is considered obese.  Maintain normal blood lipids and cholesterol by exercising and minimizing your intake of saturated fat. Eat a balanced diet with plenty of fruits and vegetables. Blood tests for lipids and cholesterol should begin at age 65 and be repeated every 5 years. If your lipid or cholesterol levels are high, you are over 50, or you are at high risk for heart disease, you may need your cholesterol levels checked more frequently.Ongoing high lipid and cholesterol levels should be treated with medicines, if diet and exercise are not working.  If you smoke, find out from your health care provider how to quit. If you do not use tobacco, do not start.  Lung cancer screening is recommended for adults aged 26 80 years who are at high risk for developing lung cancer because of a history of smoking. A yearly low-dose CT scan  of the lungs is recommended for people who have at least a 30-pack-year history of smoking and are a current smoker or have quit within the past 15 years. A pack year of smoking is smoking an average of 1 pack of cigarettes a day for 1 year (for example, a 30-pack-year history of smoking could mean smoking 1 pack a day for 30 years or 2 packs a  day for 15 years). Yearly screening should continue until the smoker has stopped smoking for at least 15 years. Yearly screening should be stopped for people who develop a health problem that would prevent them from having lung cancer treatment.  If you choose to drink alcohol, do not have more than 2 drinks per day. One drink is considered to be 12 oz (360 mL) of beer, 5 oz (150 mL) of wine, or 1.5 oz (45 mL) of liquor.  Avoid use of street drugs. Do not share needles with anyone. Ask for help if you need support or instructions about stopping the use of drugs.  High blood pressure causes heart disease and increases the risk of stroke. Blood pressure should be checked at least every 1 2 years. Ongoing high blood pressure should be treated with medicines if weight loss and exercise are not effective.  If you are 70 75 years old, ask your health care provider if you should take aspirin to prevent heart disease.  Diabetes screening involves taking a blood sample to check your fasting blood sugar level. This should be done once every 3 years after age 71, if you are at a normal weight and without risk factors for diabetes. Testing should be considered at a younger age or be carried out more frequently if you are overweight and have at least 1 risk factor for diabetes.  Colorectal cancer can be detected and often prevented. Most routine colorectal cancer screening begins at the age of 18 and continues through age 70. However, your health care provider may recommend screening at an earlier age if you have risk factors for colon cancer. On a yearly basis, your health care provider may provide home test kits to check for hidden blood in the stool. A small camera at the end of a tube may be used to directly examine the colon (sigmoidoscopy or colonoscopy) to detect the earliest forms of colorectal cancer. Talk to your health care provider about this at age 70, when routine screening begins. A direct exam of the  colon should be repeated every 5 10 years through age 71, unless early forms of pre-cancerous polyps or small growths are found.  People who are at an increased risk for hepatitis B should be screened for this virus. You are considered at high risk for hepatitis B if:  You were born in a country where hepatitis B occurs often. Talk with your health care provider about which countries are considered high-risk.  Your parents were born in a high-risk country and you have not received a shot to protect against hepatitis B (hepatitis B vaccine).  You have HIV or AIDS.  You use needles to inject street drugs.  You live with, or have sex with, someone who has hepatitis B.  You are a man who has sex with other men (MSM).  You get hemodialysis treatment.  You take certain medicines for conditions like cancer, organ transplantation, and autoimmune conditions.  Hepatitis C blood testing is recommended for all people born from [birth year] through 1967 and any individual with known risk factors for  hepatitis C.  Healthy men should no longer receive prostate-specific antigen (PSA) blood tests as part of routine cancer screening. Talk to your health care provider about prostate cancer screening.  Testicular cancer screening is not recommended for adolescents or adult males who have no symptoms. Screening includes self-exam, a health care provider exam, and other screening tests. Consult with your health care provider about any symptoms you have or any concerns you have about testicular cancer.  Practice safe sex. Use condoms and avoid high-risk sexual practices to reduce the spread of sexually transmitted infections (STIs).  Use sunscreen. Apply sunscreen liberally and repeatedly throughout the day. You should seek shade when your shadow is shorter than you. Protect yourself by wearing long sleeves, pants, a wide-brimmed hat, and sunglasses year round, whenever you are outdoors.  Tell your health care  provider of new moles or changes in moles, especially if there is a change in shape or color. Also tell your provider if a mole is larger than the size of a pencil eraser.  A one-time screening for abdominal aortic aneurysm (AAA) and surgical repair of large AAAs by ultrasound is recommended for men aged 9 75 years who are current or former smokers.  Stay current with your vaccines (immunizations). Document Released: 04/30/2008 Document Revised: 08/23/2013 Document Reviewed: 03/30/2011 [facility] Patient Information 2014 [place], [place]. Diabetes and Standards of Medical Care  Diabetes is complicated. You may find that your diabetes team includes a dietitian, nurse, diabetes educator, eye doctor, and more. To help everyone know what is going on and to help you get the care you deserve, the following schedule of care was developed to help keep you on track. Below are the tests, exams, vaccines, medicines, education, and plans you will need. HbA1c test This test shows how well you have controlled your glucose over the past 2 3 months. It is used to see if your diabetes management plan needs to be adjusted.   It is performed at least 2 times a year if you are meeting treatment goals.  It is performed 4 times a year if therapy has changed or if you are not meeting treatment goals. Blood pressure test  This test is performed at every routine medical visit. The goal is less than 140/90 mmHg for most people, but 130/80 mmHg in some cases. Ask your health care provider about your goal. Dental exam  Follow up with the dentist regularly. Eye exam  If you are diagnosed with type 1 diabetes as a child, get an exam upon reaching the age of 19 years or older and have had diabetes for 3 5 years. Yearly eye exams are recommended after that initial eye exam.  If you are diagnosed with type 1 diabetes as an adult, get an exam within 5 years of diagnosis and then yearly.  If you are diagnosed with type 2  diabetes, get an exam as soon as possible after the diagnosis and then yearly. Foot care exam  Visual foot exams are performed at every routine medical visit. The exams check for cuts, injuries, or other problems with the feet.  A comprehensive foot exam should be done yearly. This includes visual inspection as well as assessing foot pulses and testing for loss of sensation.  Check your feet nightly for cuts, injuries, or other problems with your feet. Tell your health care provider if anything is not healing. Kidney function test (urine microalbumin)  This test is performed once a year.  Type 1 diabetes: The first test  is performed 5 years after diagnosis.  Type 2 diabetes: The first test is performed at the time of diagnosis.  A serum creatinine and estimated glomerular filtration rate (eGFR) test is done once a year to assess the level of chronic kidney disease (CKD), if present. Lipid profile (cholesterol, HDL, LDL, triglycerides)  Performed every 5 years for most people.  The goal for LDL is less than 100 mg/dL. If you are at high risk, the goal is less than 70 mg/dL.  The goal for HDL is 40 mg/dL 50 mg/dL for men and 50 mg/dL 60 mg/dL for women. An HDL cholesterol of 60 mg/dL or higher gives some protection against heart disease.  The goal for triglycerides is less than 150 mg/dL. Influenza vaccine, pneumococcal vaccine, and hepatitis B vaccine  The influenza vaccine is recommended yearly.  The pneumococcal vaccine is generally given once in a lifetime. However, there are some instances when another vaccination is recommended. Check with your health care provider.  The hepatitis B vaccine is also recommended for adults with diabetes. Diabetes self-management education  Education is recommended at diagnosis and ongoing as needed. Treatment plan  Your treatment plan is reviewed at every medical visit. Document Released: 08/30/2009 Document Revised: 07/05/2013 Document  Reviewed: 04/04/2013 [facility] Patient Information 2014 [place].

## 2013-12-08 ENCOUNTER — Ambulatory Visit (HOSPITAL_COMMUNITY): Payer: Medicare Other | Attending: Internal Medicine

## 2013-12-08 DIAGNOSIS — I10 Essential (primary) hypertension: Secondary | ICD-10-CM

## 2013-12-08 DIAGNOSIS — E119 Type 2 diabetes mellitus without complications: Secondary | ICD-10-CM

## 2013-12-08 DIAGNOSIS — E785 Hyperlipidemia, unspecified: Secondary | ICD-10-CM

## 2014-01-24 DIAGNOSIS — H43819 Vitreous degeneration, unspecified eye: Secondary | ICD-10-CM | POA: Diagnosis not present

## 2014-01-24 LAB — HM DIABETES EYE EXAM

## 2014-01-25 ENCOUNTER — Encounter: Payer: Self-pay | Admitting: Internal Medicine

## 2014-01-26 ENCOUNTER — Other Ambulatory Visit: Payer: Self-pay | Admitting: *Deleted

## 2014-01-26 MED ORDER — VITAMIN D 50 MCG (2000 UT) PO CAPS: 1.0000 | ORAL_CAPSULE | ORAL | Status: DC

## 2014-01-26 MED ORDER — AMLODIPINE BESYLATE 10 MG PO TABS: 10.0000 mg | ORAL_TABLET | Freq: Every day | ORAL | Status: DC

## 2014-01-26 MED ORDER — METFORMIN HCL ER 500 MG PO TB24: 1000.0000 mg | ORAL_TABLET | Freq: Two times a day (BID) | ORAL | Status: DC

## 2014-01-26 MED ORDER — TADALAFIL 20 MG PO TABS: 10.0000 mg | ORAL_TABLET | ORAL | Status: DC | PRN

## 2014-01-26 MED ORDER — HYDROCHLOROTHIAZIDE 25 MG PO TABS: 25.0000 mg | ORAL_TABLET | Freq: Every day | ORAL | Status: DC

## 2014-01-26 MED ORDER — LORATADINE 10 MG PO TABS: 10.0000 mg | ORAL_TABLET | Freq: Every day | ORAL | Status: DC

## 2014-01-26 MED ORDER — ATORVASTATIN CALCIUM 40 MG PO TABS
20.0000 mg | ORAL_TABLET | Freq: Every day | ORAL | Status: DC
Start: 2014-01-26 — End: 2015-01-29

## 2014-01-26 MED ORDER — ASPIRIN 81 MG PO TABS: 81.0000 mg | ORAL_TABLET | Freq: Every day | ORAL | Status: DC

## 2014-01-26 MED ORDER — OMEPRAZOLE 20 MG PO CPDR: 20.0000 mg | DELAYED_RELEASE_CAPSULE | Freq: Every day | ORAL | Status: DC

## 2014-01-26 MED ORDER — MAGNESIUM OXIDE 400 MG PO TABS: 400.0000 mg | ORAL_TABLET | Freq: Two times a day (BID) | ORAL | Status: DC

## 2014-01-26 MED ORDER — LISINOPRIL 20 MG PO TABS: 20.0000 mg | ORAL_TABLET | Freq: Two times a day (BID) | ORAL | Status: DC

## 2014-01-26 NOTE — Telephone Encounter (Signed)
Sent email needing refills sent on all meds to [facility]...Nathan Gomez

## 2014-02-23 ENCOUNTER — Other Ambulatory Visit: Payer: Self-pay | Admitting: *Deleted

## 2014-02-23 MED ORDER — HYDROCHLOROTHIAZIDE 25 MG PO TABS: 25.0000 mg | ORAL_TABLET | Freq: Every day | ORAL | Status: DC

## 2014-02-23 NOTE — Telephone Encounter (Signed)
Sent email stating HCTZ was not sent to [facility]. Resending to mail service...Nathan Gomez

## 2014-03-14 DIAGNOSIS — M1710 Unilateral primary osteoarthritis, unspecified knee: Secondary | ICD-10-CM | POA: Diagnosis not present

## 2014-03-14 DIAGNOSIS — M545 Low back pain, unspecified: Secondary | ICD-10-CM | POA: Diagnosis not present

## 2014-05-30 ENCOUNTER — Ambulatory Visit (INDEPENDENT_AMBULATORY_CARE_PROVIDER_SITE_OTHER): Payer: Medicare Other | Admitting: Internal Medicine

## 2014-05-30 ENCOUNTER — Other Ambulatory Visit (INDEPENDENT_AMBULATORY_CARE_PROVIDER_SITE_OTHER): Payer: Medicare Other

## 2014-05-30 ENCOUNTER — Encounter: Payer: Self-pay | Admitting: Internal Medicine

## 2014-05-30 VITALS — BP 110/64 | HR 64 | Temp 98.6°F | Ht 71.0 in | Wt 191.1 lb

## 2014-05-30 DIAGNOSIS — I10 Essential (primary) hypertension: Secondary | ICD-10-CM | POA: Diagnosis not present

## 2014-05-30 DIAGNOSIS — M1990 Unspecified osteoarthritis, unspecified site: Secondary | ICD-10-CM | POA: Diagnosis not present

## 2014-05-30 DIAGNOSIS — E119 Type 2 diabetes mellitus without complications: Secondary | ICD-10-CM | POA: Diagnosis not present

## 2014-05-30 DIAGNOSIS — E785 Hyperlipidemia, unspecified: Secondary | ICD-10-CM | POA: Diagnosis not present

## 2014-05-30 LAB — HEMOGLOBIN A1C: Hgb A1c MFr Bld: 6.9 % — ABNORMAL HIGH (ref 4.6–6.5)

## 2014-05-30 LAB — MICROALBUMIN / CREATININE URINE RATIO
Creatinine,U: 117.9 mg/dL
Microalb Creat Ratio: 0.5 mg/g (ref 0.0–30.0)
Microalb, Ur: 0.6 mg/dL (ref 0.0–1.9)

## 2014-05-30 MED ORDER — METFORMIN HCL ER 500 MG PO TB24: 1000.0000 mg | ORAL_TABLET | Freq: Two times a day (BID) | ORAL | Status: DC

## 2014-05-30 NOTE — Progress Notes (Signed)
Subjective:    Patient ID: Nathan Gomez, male    DOB: 1941/10/19, 73 y.o.   MRN: [id number]  HPI  Patient here for follow up Also reviewed chronic medical issues and interval medical events  Past Medical History  Diagnosis Date  . Hypertension   . Diabetes mellitus, type 2     Type II  . Dyslipidemia   . Osteoarthritis   . GERD (gastroesophageal reflux disease)   . Personal history of colonic adenomas 01/16/2013    Review of Systems  Respiratory: Negative for cough and shortness of breath.   Cardiovascular: Negative for chest pain, palpitations and leg swelling.  Musculoskeletal: Positive for arthralgias (R knee, chronic). Negative for back pain and joint swelling.       Objective:   Physical Exam  BP 110/64  Pulse 64  Temp(Src) 98.6 F (37 C) (Oral)  Ht 5\' 11"  (1.803 m)  Wt 191 lb 1.9 oz (86.691 kg)  BMI 26.67 kg/m2  SpO2 95% Wt Readings from Last 3 Encounters:  05/30/14 191 lb 1.9 oz (86.691 kg)  12/05/13 190 lb 1.9 oz (86.238 kg)  06/02/13 185 lb 9.6 oz (84.188 kg)   Constitutional: he appears well-developed and well-nourished. No distress.  Neck: Normal range of motion. Neck supple. No JVD present. No thyromegaly present.  Cardiovascular: Normal rate, regular rhythm and normal heart sounds.  No murmur heard. No BLE edema. Pulmonary/Chest: Effort normal and breath sounds normal. No respiratory distress. he has no wheezes.  Psychiatric: he has a normal mood and affect. His behavior is normal. Judgment and thought content normal.   Lab Results  Component Value Date   WBC 4.6 02/22/2012   HGB 13.7 02/22/2012   HCT 41.7 02/22/2012   PLT 182.0 02/22/2012   GLUCOSE 133* 12/05/2013   CHOL 142 12/05/2013   TRIG 97.0 12/05/2013   HDL 39.30 12/05/2013   LDLDIRECT 80.7 02/22/2012   LDLCALC 83 12/05/2013   ALT 22 12/05/2013   AST 18 12/05/2013   NA 140 12/05/2013   K 4.9 12/05/2013   CL 106 12/05/2013   CREATININE 1.4 12/05/2013   BUN 30* 12/05/2013   CO2 27 12/05/2013   TSH  1.58 02/22/2012   PSA 3.38 01/05/2011   INR 2.67* 08/14/2010   HGBA1C 6.8* 12/05/2013   MICROALBUR 1.2 06/02/2013    Dg Chest 2 View  02/22/2012   *RADIOLOGY REPORT*  Clinical Data: Cough.  Hypertension.  CHEST - 2 VIEW  Comparison: Two-view chest 08/01/2010.  Findings: The heart size is normal.  The lungs are clear.  Mild rightward curvature of the thoracic spine is stable.  IMPRESSION:  1.  No acute cardiopulmonary disease. 2.  Mild rightward curvature of the thoracic spine is stable.  Original Report Authenticated By: Resa Miner. MATTERN, M.D.      Assessment & Plan:   Problem List Items Addressed This Visit   DIABETES MELLITUS, TYPE II - Primary      Lab Results  Component Value Date   HGBA1C 6.8* 12/05/2013  on metformin Also statin, ACEI and ASA Check a1c q28mo and microalb annually Follows with optho annually - Working on weight control as well as medication as ongoing - continue same    Relevant Medications      metFORMIN (GLUCOPHAGE-XR) 24 hr tablet   Other Relevant Orders      Hemoglobin A1c      Microalbumin / creatinine urine ratio   DYSLIPIDEMIA     On statin Check annually The current  medical regimen is effective;  continue present plan and medications.    HYPERTENSION      BP Readings from Last 3 Encounters:  05/30/14 110/64  12/05/13 130/80  06/02/13 138/72   The current medical regimen is effective;  continue present plan and medications.    OSTEOARTHRITIS     chronic problems with right knee reviewed Last flare late spring 2014 -seen by orthopedic specialist while in [place] who suggested partial knee replacement for medial compartment dz Now following with local orthopedics Dr. Rhona Raider -2 cortisone injections with improvement, but recurrent symptoms  Considering surgical options, question total knee replacement versus partial No nocturnal pain, no current joint swelling Continue conservative management as ongoing

## 2014-05-30 NOTE — Assessment & Plan Note (Signed)
BP Readings from Last 3 Encounters:  05/30/14 110/64  12/05/13 130/80  06/02/13 138/72   The current medical regimen is effective;  continue present plan and medications.

## 2014-05-30 NOTE — Assessment & Plan Note (Signed)
Lab Results  Component Value Date   HGBA1C 6.8* 12/05/2013  on metformin Also statin, ACEI and ASA Check a1c q3mo and microalb annually Follows with optho annually - Working on weight control as well as medication as ongoing - continue same

## 2014-05-30 NOTE — Assessment & Plan Note (Signed)
On statin Check annually The current medical regimen is effective;  continue present plan and medications.

## 2014-05-30 NOTE — Patient Instructions (Addendum)
It was good to see you today.  We have reviewed your prior records including labs and tests today  Test(s) ordered today. Your results will be released to [place] (or called to you) after review, usually within 72hours after test completion. If any changes need to be made, you will be notified at that same time.  Medications reviewed and updated, no changes recommended at this time. Refill on medication(s) as discussed today.  Continue working with your specialists as ongoing for your knee  Please schedule followup in 6 months for annual exam/labs, call sooner if problems.  Check with insurance about coverage for Prevnar

## 2014-05-30 NOTE — Assessment & Plan Note (Signed)
chronic problems with right knee reviewed Last flare late spring 2014 -seen by orthopedic specialist while in [place] who suggested partial knee replacement for medial compartment dz Now following with local orthopedics Dr. Rhona Raider -2 cortisone injections with improvement, but recurrent symptoms  Considering surgical options, question total knee replacement versus partial No nocturnal pain, no current joint swelling Continue conservative management as ongoing

## 2014-05-30 NOTE — Progress Notes (Signed)
Pre visit review using our clinic review tool, if applicable. No additional management support is needed unless otherwise documented below in the visit note. 

## 2014-06-22 ENCOUNTER — Emergency Department (INDEPENDENT_AMBULATORY_CARE_PROVIDER_SITE_OTHER)
Admission: EM | Admit: 2014-06-22 | Discharge: 2014-06-22 | Disposition: A | Discharge status: Home / Self Care | Payer: Medicare Other | Source: Home / Self Care | Attending: Family Medicine | Admitting: Family Medicine

## 2014-06-22 ENCOUNTER — Encounter (HOSPITAL_COMMUNITY): Payer: Self-pay | Admitting: Emergency Medicine

## 2014-06-22 DIAGNOSIS — R197 Diarrhea, unspecified: Secondary | ICD-10-CM

## 2014-06-22 DIAGNOSIS — M169 Osteoarthritis of hip, unspecified: Secondary | ICD-10-CM | POA: Diagnosis not present

## 2014-06-22 DIAGNOSIS — M1610 Unilateral primary osteoarthritis, unspecified hip: Secondary | ICD-10-CM | POA: Diagnosis not present

## 2014-06-22 DIAGNOSIS — M1710 Unilateral primary osteoarthritis, unspecified knee: Secondary | ICD-10-CM | POA: Diagnosis not present

## 2014-06-22 LAB — CBC WITH DIFFERENTIAL/PLATELET
Basophils Absolute: 0 10*3/uL (ref 0.0–0.1)
Basophils Relative: 0 % (ref 0–1)
Eosinophils Absolute: 0 10*3/uL (ref 0.0–0.7)
Eosinophils Relative: 0 % (ref 0–5)
HCT: 40.3 % (ref 39.0–52.0)
Hemoglobin: 13.1 g/dL (ref 13.0–17.0)
Lymphocytes Relative: 6 % — ABNORMAL LOW (ref 12–46)
Lymphs Abs: 0.4 10*3/uL — ABNORMAL LOW (ref 0.7–4.0)
MCH: 31.5 pg (ref 26.0–34.0)
MCHC: 32.5 g/dL (ref 30.0–36.0)
MCV: 96.9 fL (ref 78.0–100.0)
Monocytes Absolute: 0.1 10*3/uL (ref 0.1–1.0)
Monocytes Relative: 2 % — ABNORMAL LOW (ref 3–12)
Neutro Abs: 6.6 10*3/uL (ref 1.7–7.7)
Neutrophils Relative %: 92 % — ABNORMAL HIGH (ref 43–77)
Platelets: 201 10*3/uL (ref 150–400)
RBC: 4.16 MIL/uL — ABNORMAL LOW (ref 4.22–5.81)
RDW: 13.9 % (ref 11.5–15.5)
WBC: 7.1 10*3/uL (ref 4.0–10.5)

## 2014-06-22 LAB — COMPREHENSIVE METABOLIC PANEL
ALT: 16 U/L (ref 0–53)
AST: 16 U/L (ref 0–37)
Albumin: 4.3 g/dL (ref 3.5–5.2)
Alkaline Phosphatase: 112 U/L (ref 39–117)
Anion gap: 14 (ref 5–15)
BUN: 28 mg/dL — ABNORMAL HIGH (ref 6–23)
CO2: 22 mEq/L (ref 19–32)
Calcium: 9.7 mg/dL (ref 8.4–10.5)
Chloride: 103 mEq/L (ref 96–112)
Creatinine, Ser: 1.25 mg/dL (ref 0.50–1.35)
GFR calc Af Amer: 63 mL/min — ABNORMAL LOW (ref >90)
GFR calc non Af Amer: 55 mL/min — ABNORMAL LOW (ref >90)
Glucose, Bld: 148 mg/dL — ABNORMAL HIGH (ref 70–99)
Potassium: 5.7 mEq/L — ABNORMAL HIGH (ref 3.7–5.3)
Sodium: 139 mEq/L (ref 137–147)
Total Bilirubin: 0.5 mg/dL (ref 0.3–1.2)
Total Protein: 7.3 g/dL (ref 6.0–8.3)

## 2014-06-22 LAB — OCCULT BLOOD, POC DEVICE: Fecal Occult Bld: NEGATIVE

## 2014-06-22 NOTE — ED Notes (Signed)
Having loose stools x 4-5 weeks, had well tested , and had coloform in well water. sanitized well, now tests negative, but still has loose stools every AM

## 2014-06-22 NOTE — ED Provider Notes (Signed)
CSN: [id number]     Arrival date & time 06/22/14  1307 History   First MD Initiated Contact with Patient 06/22/14 1440     Chief Complaint  Patient presents with  . Diarrhea   (Consider location/radiation/quality/duration/timing/severity/associated sxs/prior Treatment) HPI  Diarrhea: started on July 1. Initially happening every other day. Pts water on a well. Had the water tested adn treated. No Ecoli. Has not had the water since July 10th. Usually in the morning. Stool is non-bloody, just very loose. Wife in home w/o symptoms. Denies SOB, CP, joint pains, rash. Not getting better. Energy level preserved. No recent changes in medications. Imodium w/ some benefit. Typically w/ daily BM. nml color.    Past Medical History  Diagnosis Date  . Hypertension   . Diabetes mellitus, type 2     Type II  . Dyslipidemia   . Osteoarthritis     right knee  . GERD (gastroesophageal reflux disease)   . Personal history of colonic adenomas 01/16/2013   Past Surgical History  Procedure Laterality Date  . Tonsillectomy  1945  . 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 Cataract extraction  08/2012 L, 09/2012 R  . Colonoscopy     Family History  Problem Relation Age of Onset  . Cancer Mother     Ovarian  . Hypertension Father   . Diabetes Maternal Grandfather   . Pneumonia Paternal Grandfather    History  Substance Use Topics  . Smoking status: Former Smoker    Quit date: 11/16/1986  . Smokeless tobacco: Never Used  . Alcohol Use: 1.8 oz/week    3 Glasses of wine per week    Review of Systems Per HPI with all other pertinent systems negative.   Allergies  Review of patient's allergies indicates no known allergies.  Home Medications   Prior to Admission medications   Medication Sig Start Date End Date Taking? Authorizing Provider  amLODipine (NORVASC) 10 MG tablet Take 1 tablet (10 mg total) by mouth daily. 01/26/14   Rowe Clack, MD  aspirin 81 MG tablet Take 1 tablet (81 mg  total) by mouth daily. 01/26/14   Rowe Clack, MD  atorvastatin (LIPITOR) 40 MG tablet Take 0.5 tablets (20 mg total) by mouth daily. 01/26/14   Rowe Clack, MD  Cholecalciferol (VITAMIN D) 2000 UNITS CAPS Take 1 capsule (2,000 Units total) by mouth every morning. 01/26/14   Rowe Clack, MD  Glucosamine Sulfate POWD by Does not apply route.      Historical Provider, MD  hydrochlorothiazide (HYDRODIURIL) 25 MG tablet Take 1 tablet (25 mg total) by mouth daily. 02/23/14   Rowe Clack, MD  Inositol Niacinate (NIACIN FLUSH FREE) 500 MG CAPS Take 1 capsule by mouth at bedtime.      Historical Provider, MD  Javier Docker Oil 300 MG CAPS Take 1 capsule by mouth daily.    Historical Provider, MD  lisinopril (PRINIVIL,ZESTRIL) 20 MG tablet Take 1 tablet (20 mg total) by mouth 2 (two) times daily. 01/26/14   Rowe Clack, MD  loratadine (CLARITIN) 10 MG tablet Take 1 tablet (10 mg total) by mouth daily. 01/26/14 10/31/15  Rowe Clack, MD  magnesium oxide (MAG-OX) 400 MG tablet Take 1 tablet (400 mg total) by mouth 2 (two) times daily. 01/26/14   Rowe Clack, MD  metFORMIN (GLUCOPHAGE-XR) 500 MG 24 hr tablet Take 2 tablets (1,000 mg total) by mouth 2 (two) times daily. 05/30/14   Mateo Flow  Curlene Dolphin, MD  Multiple Vitamins-Minerals (SENIOR MULTIVITAMIN PLUS) TABS Take 1 tablet by mouth daily.      Historical Provider, MD  omeprazole (PRILOSEC) 20 MG capsule Take 1 capsule (20 mg total) by mouth at bedtime. 01/26/14   Rowe Clack, MD  tadalafil (CIALIS) 20 MG tablet Take 0.5-1 tablets (10-20 mg total) by mouth every other day as needed for erectile dysfunction. 01/26/14   Rowe Clack, MD   BP 122/68  Pulse 60  Temp(Src) 98 F (36.7 C) (Oral)  Resp 20  Ht 6' (1.829 m)  Wt 185 lb (83.915 kg)  BMI 25.08 kg/m2  SpO2 98% Physical Exam  Constitutional: He appears well-developed and well-nourished. No distress.  HENT:  Head: Normocephalic and atraumatic.  Eyes: EOM  are normal. Pupils are equal, round, and reactive to light.  Neck: Normal range of motion.  Cardiovascular: Normal rate, normal heart sounds and intact distal pulses.   No murmur heard. Pulmonary/Chest: Breath sounds normal. No respiratory distress.  Abdominal: Soft. He exhibits no distension and no mass. There is no tenderness. There is no rebound and no guarding.  Musculoskeletal: Normal range of motion. He exhibits no edema and no tenderness.  Skin: Skin is warm. No rash noted. He is not diaphoretic.  Psychiatric: He has a normal mood and affect. His behavior is normal. Judgment and thought content normal.    ED Course  Procedures (including critical care time) Labs Review Labs Reviewed  STOOL CULTURE  CLOSTRIDIUM DIFFICILE BY PCR  OVA AND PARASITE EXAMINATION  CBC WITH DIFFERENTIAL  COMPREHENSIVE METABOLIC PANEL  POC OCCULT BLOOD, ED    Imaging Review No results found.   MDM   1. Diarrhea   etiology unclear. Concern for infectious source. Non-toxic appearing today. No evidence of other GI pathology such as malignancy, cholecystitis, diverticulitis, pancreatic insufficiency.  - Start live active culture foods and probiotics - imodium prn Stool ova an parasite C Diff Stool cx CBC w/ Diff adn white count Precautions given and all questions answered  Linna Darner, MD Family Medicine 06/22/2014, 3:26 PM      Waldemar Dickens, MD 06/22/14 [phone]

## 2014-06-22 NOTE — Discharge Instructions (Signed)
The cause of your diarrhea is not clear.  This may be from an infection.  The lab work sent to day will look for most major sources of infection. THis will allow [place] to tailor your treatment. In the meantime start taking probiotics, yogurt, cheeses and milk to replace your normal gut bacteria. We will call you if there is anything that come back concerning Use the imodium as needed Call if you have not heard anything in 3-4 days.

## 2014-06-23 LAB — CLOSTRIDIUM DIFFICILE BY PCR: Toxigenic C. Difficile by PCR: NEGATIVE

## 2014-06-25 LAB — OVA AND PARASITE EXAMINATION: Ova and parasites: NONE SEEN

## 2014-06-26 LAB — STOOL CULTURE

## 2014-06-29 DIAGNOSIS — M25559 Pain in unspecified hip: Secondary | ICD-10-CM | POA: Diagnosis not present

## 2014-07-12 ENCOUNTER — Telehealth (HOSPITAL_COMMUNITY): Payer: Self-pay | Admitting: *Deleted

## 2014-07-12 DIAGNOSIS — Z23 Encounter for immunization: Secondary | ICD-10-CM | POA: Diagnosis not present

## 2014-07-12 NOTE — ED Notes (Signed)
Dr. Marily Memos wanted pt. Notified that stool tests were all neg for infection, his K was elevated at 5.7 and f/u with his PCP.  He should get rechecked if still having symptoms.  I called pt.'s wife.  She said she takes his results. I gave her this information. Roselyn Meier 07/12/2014

## 2014-07-16 ENCOUNTER — Other Ambulatory Visit: Payer: Self-pay

## 2014-07-24 ENCOUNTER — Encounter: Payer: Self-pay | Admitting: Internal Medicine

## 2014-07-24 DIAGNOSIS — E875 Hyperkalemia: Secondary | ICD-10-CM

## 2014-07-30 ENCOUNTER — Other Ambulatory Visit (INDEPENDENT_AMBULATORY_CARE_PROVIDER_SITE_OTHER): Payer: Medicare Other

## 2014-07-30 DIAGNOSIS — E875 Hyperkalemia: Secondary | ICD-10-CM

## 2014-07-30 LAB — BASIC METABOLIC PANEL
BUN: 30 mg/dL — ABNORMAL HIGH (ref 6–23)
CO2: 24 mEq/L (ref 19–32)
Calcium: 9.3 mg/dL (ref 8.4–10.5)
Chloride: 108 mEq/L (ref 96–112)
Creatinine, Ser: 1.5 mg/dL (ref 0.4–1.5)
GFR: 49.21 mL/min — ABNORMAL LOW (ref >60.00)
Glucose, Bld: 118 mg/dL — ABNORMAL HIGH (ref 70–99)
Potassium: 5.3 mEq/L — ABNORMAL HIGH (ref 3.5–5.1)
Sodium: 139 mEq/L (ref 135–145)

## 2014-07-31 ENCOUNTER — Other Ambulatory Visit: Payer: Self-pay | Admitting: Internal Medicine

## 2014-07-31 DIAGNOSIS — M25559 Pain in unspecified hip: Secondary | ICD-10-CM | POA: Diagnosis not present

## 2014-07-31 DIAGNOSIS — E875 Hyperkalemia: Secondary | ICD-10-CM

## 2014-07-31 MED ORDER — LISINOPRIL 20 MG PO TABS: 20.0000 mg | ORAL_TABLET | Freq: Every day | ORAL | Status: DC

## 2014-09-29 DIAGNOSIS — M1711 Unilateral primary osteoarthritis, right knee: Secondary | ICD-10-CM | POA: Diagnosis not present

## 2014-10-06 DIAGNOSIS — M1711 Unilateral primary osteoarthritis, right knee: Secondary | ICD-10-CM | POA: Diagnosis not present

## 2014-10-15 DIAGNOSIS — M1711 Unilateral primary osteoarthritis, right knee: Secondary | ICD-10-CM | POA: Diagnosis not present

## 2014-10-22 DIAGNOSIS — M1711 Unilateral primary osteoarthritis, right knee: Secondary | ICD-10-CM | POA: Diagnosis not present

## 2014-10-29 DIAGNOSIS — M1711 Unilateral primary osteoarthritis, right knee: Secondary | ICD-10-CM | POA: Diagnosis not present

## 2014-11-16 DIAGNOSIS — M23204 Derangement of unspecified medial meniscus due to old tear or injury, left knee: Secondary | ICD-10-CM

## 2014-11-16 HISTORY — DX: Derangement of unspecified medial meniscus due to old tear or injury, left knee: M23.204

## 2014-11-19 DIAGNOSIS — M25562 Pain in left knee: Secondary | ICD-10-CM | POA: Diagnosis not present

## 2014-11-20 DIAGNOSIS — M1712 Unilateral primary osteoarthritis, left knee: Secondary | ICD-10-CM | POA: Diagnosis not present

## 2014-11-26 DIAGNOSIS — M25562 Pain in left knee: Secondary | ICD-10-CM | POA: Diagnosis not present

## 2014-11-30 ENCOUNTER — Encounter: Payer: Self-pay | Admitting: Internal Medicine

## 2014-12-03 ENCOUNTER — Encounter: Payer: Medicare Other | Admitting: Internal Medicine

## 2015-01-04 DIAGNOSIS — M25562 Pain in left knee: Secondary | ICD-10-CM | POA: Diagnosis not present

## 2015-01-21 DIAGNOSIS — M25562 Pain in left knee: Secondary | ICD-10-CM | POA: Diagnosis not present

## 2015-01-29 ENCOUNTER — Ambulatory Visit (INDEPENDENT_AMBULATORY_CARE_PROVIDER_SITE_OTHER): Payer: Medicare Other | Admitting: Internal Medicine

## 2015-01-29 ENCOUNTER — Encounter: Payer: Self-pay | Admitting: Internal Medicine

## 2015-01-29 ENCOUNTER — Other Ambulatory Visit (INDEPENDENT_AMBULATORY_CARE_PROVIDER_SITE_OTHER): Payer: Medicare Other

## 2015-01-29 VITALS — BP 130/82 | HR 63 | Temp 97.7°F | Ht 72.0 in | Wt 190.5 lb

## 2015-01-29 DIAGNOSIS — Z0000 Encounter for general adult medical examination without abnormal findings: Secondary | ICD-10-CM

## 2015-01-29 DIAGNOSIS — E119 Type 2 diabetes mellitus without complications: Secondary | ICD-10-CM

## 2015-01-29 DIAGNOSIS — Z23 Encounter for immunization: Secondary | ICD-10-CM | POA: Diagnosis not present

## 2015-01-29 DIAGNOSIS — I10 Essential (primary) hypertension: Secondary | ICD-10-CM

## 2015-01-29 LAB — BASIC METABOLIC PANEL
BUN: 27 mg/dL — ABNORMAL HIGH (ref 6–23)
CO2: 27 mEq/L (ref 19–32)
Calcium: 9.6 mg/dL (ref 8.4–10.5)
Chloride: 104 mEq/L (ref 96–112)
Creatinine, Ser: 1.48 mg/dL (ref 0.40–1.50)
GFR: 49.15 mL/min — ABNORMAL LOW (ref >60.00)
Glucose, Bld: 123 mg/dL — ABNORMAL HIGH (ref 70–99)
Potassium: 4.9 mEq/L (ref 3.5–5.1)
Sodium: 136 mEq/L (ref 135–145)

## 2015-01-29 LAB — HEMOGLOBIN A1C: Hgb A1c MFr Bld: 6.9 % — ABNORMAL HIGH (ref 4.6–6.5)

## 2015-01-29 LAB — LIPID PANEL
Cholesterol: 182 mg/dL (ref 0–200)
HDL: 36.9 mg/dL — ABNORMAL LOW (ref >39.00)
LDL Cholesterol: 109 mg/dL — ABNORMAL HIGH (ref 0–99)
NonHDL: 145.1
Total CHOL/HDL Ratio: 5
Triglycerides: 180 mg/dL — ABNORMAL HIGH (ref 0.0–149.0)
VLDL: 36 mg/dL (ref 0.0–40.0)

## 2015-01-29 LAB — MICROALBUMIN / CREATININE URINE RATIO
Creatinine,U: 123.7 mg/dL
Microalb Creat Ratio: 1.2 mg/g (ref 0.0–30.0)
Microalb, Ur: 1.5 mg/dL (ref 0.0–1.9)

## 2015-01-29 LAB — MAGNESIUM: Magnesium: 1.3 mg/dL — ABNORMAL LOW (ref 1.5–2.5)

## 2015-01-29 MED ORDER — HYDROCHLOROTHIAZIDE 25 MG PO TABS: 25.0000 mg | ORAL_TABLET | Freq: Every morning | ORAL | Status: DC

## 2015-01-29 MED ORDER — SOLUBLE FIBER/PROBIOTICS PO CHEW: 2.0000 | CHEWABLE_TABLET | Freq: Every day | ORAL | Status: DC

## 2015-01-29 MED ORDER — METFORMIN HCL ER 500 MG PO TB24: 1000.0000 mg | ORAL_TABLET | Freq: Two times a day (BID) | ORAL | Status: DC

## 2015-01-29 MED ORDER — ATORVASTATIN CALCIUM 40 MG PO TABS: 20.0000 mg | ORAL_TABLET | Freq: Every day | ORAL | Status: DC

## 2015-01-29 MED ORDER — OMEPRAZOLE 20 MG PO CPDR: 20.0000 mg | DELAYED_RELEASE_CAPSULE | Freq: Every day | ORAL | Status: DC

## 2015-01-29 NOTE — Progress Notes (Signed)
Pre visit review using our clinic review tool, if applicable. No additional management support is needed unless otherwise documented below in the visit note. 

## 2015-01-29 NOTE — Assessment & Plan Note (Signed)
BP Readings from Last 3 Encounters:  01/29/15 130/82  06/22/14 122/68  05/30/14 110/64   The current medical regimen is effective;  continue present plan and medications.

## 2015-01-29 NOTE — Assessment & Plan Note (Signed)
Lab Results  Component Value Date   HGBA1C 6.9* 05/30/2014  on metformin Also statin, ACEI and ASA Check a1c q46mo and microalb annually Follows with optho annually - Working on weight control as well as medication as ongoing - continue same

## 2015-01-29 NOTE — Progress Notes (Signed)
Subjective:    Patient ID: Nathan Gomez, male    DOB: 1941-02-07, 74 y.o.   MRN: [id number]  HPI   Here for medicare wellness  Diet: heart healthy, diabetic Physical activity: sedentary Depression/mood screen: negative Hearing: intact to whispered voice Visual acuity: grossly normal, performs annual eye exam  ADLs: capable Fall risk: none Home safety: good Cognitive evaluation: intact to orientation, naming, recall and repetition EOL planning: adv directives, full code/ I agree  I have personally reviewed and have noted 1. The patient's medical and social history 2. Their use of alcohol, tobacco or illicit drugs 3. Their current medications and supplements 4. The patient's functional ability including ADL's, fall risks, home safety risks and hearing or visual impairment. 5. Diet and physical activities 6. Evidence for depression or mood disorders  Reviewed chronic conditions, current concerns and interval events  Past Medical History  Diagnosis Date  . Hypertension   . Diabetes mellitus, type 2   . Dyslipidemia   . Osteoarthritis     right knee  . GERD (gastroesophageal reflux disease)   . Personal history of colonic adenomas 01/16/2013  . 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 Ovarian cancer Mother   . Hypertension Father   . Diabetes Maternal Grandfather   . Pneumonia Paternal Grandfather    History  Substance Use Topics  . Smoking status: Former Smoker    Quit date: 11/16/1986  . Smokeless tobacco: Never Used  . Alcohol Use: 1.8 oz/week    3 Glasses of wine per week    Review of Systems  Constitutional: Negative for fever, activity change, appetite change, fatigue and unexpected weight change.  Respiratory: Negative for cough, chest tightness, shortness of breath and wheezing.   Cardiovascular: Negative for chest pain, palpitations and leg swelling.  Musculoskeletal: Positive for myalgias,  joint swelling (L knee - known meniscus tear 11/2014 - planning arthoroscopic) and arthralgias (B knees - ongoing Supraz injections on R with improvement).  Neurological: Negative for dizziness, weakness and headaches.  Psychiatric/Behavioral: Negative for dysphoric mood. The patient is not nervous/anxious.   All other systems reviewed and are negative.      Objective:    Physical Exam  Constitutional: He appears well-developed and well-nourished. No distress.  Cardiovascular: Normal rate, regular rhythm and normal heart sounds.   No murmur heard. Pulmonary/Chest: Effort normal and breath sounds normal. No respiratory distress.  Musculoskeletal:  R knee - boggy synovitis - tender to palpation over joint line; FROM and ligamentous function intact    BP 130/82 mmHg  Pulse 63  Temp(Src) 97.7 F (36.5 C) (Oral)  Ht 6' (1.829 m)  Wt 190 lb 8 oz (86.41 kg)  BMI 25.83 kg/m2  SpO2 97% Wt Readings from Last 3 Encounters:  01/29/15 190 lb 8 oz (86.41 kg)  06/22/14 185 lb (83.915 kg)  05/30/14 191 lb 1.9 oz (86.691 kg)    Lab Results  Component Value Date   WBC 7.1 06/22/2014   HGB 13.1 06/22/2014   HCT 40.3 06/22/2014   PLT 201 06/22/2014   GLUCOSE 118* 07/30/2014   CHOL 142 12/05/2013   TRIG 97.0 12/05/2013   HDL 39.30 12/05/2013   LDLDIRECT 80.7 02/22/2012   LDLCALC 83 12/05/2013   ALT 16 06/22/2014   AST 16 06/22/2014   NA 139 07/30/2014   K 5.3* 07/30/2014   CL 108 07/30/2014   CREATININE 1.5 07/30/2014   BUN 30* 07/30/2014  CO2 24 07/30/2014   TSH 1.58 02/22/2012   PSA 3.38 01/05/2011   INR 2.67* 08/14/2010   HGBA1C 6.9* 05/30/2014   MICROALBUR 0.6 05/30/2014    No results found.     Assessment & Plan:   AWV/z00.00 - Today patient counseled on age appropriate routine health concerns for screening and prevention, each reviewed and up to date or declined. Immunizations reviewed and up to date or declined. Labs ordered and reviewed. Risk factors for depression  reviewed and negative. Hearing function and visual acuity are intact. ADLs screened and addressed as needed. Functional ability and level of safety reviewed and appropriate. Education, counseling and referrals performed based on assessed risks today. Patient provided with a copy of personalized plan for preventive services.  Problem List Items Addressed This Visit    Diabetes mellitus type 2, controlled    Lab Results  Component Value Date   HGBA1C 6.9* 05/30/2014  on metformin Also statin, ACEI and ASA Check a1c q60mo and microalb annually Follows with optho annually - Working on weight control as well as medication as ongoing - continue same      Relevant Medications   metFORMIN (GLUCOPHAGE-XR) 24 hr tablet   atorvastatin (LIPITOR) tablet   Other Relevant Orders   Hemoglobin L4J   Basic metabolic panel   Lipid panel   Microalbumin / creatinine urine ratio   Essential hypertension    BP Readings from Last 3 Encounters:  01/29/15 130/82  06/22/14 122/68  05/30/14 110/64   The current medical regimen is effective;  continue present plan and medications.      Relevant Medications   atorvastatin (LIPITOR) tablet   hydrochlorothiazide tablet    Other Visit Diagnoses    Routine general medical examination at a health care facility    -  Primary    Need for prophylactic vaccination against Streptococcus pneumoniae (pneumococcus)        Relevant Orders    Pneumococcal conjugate vaccine 13-valent        Gwendolyn Grant, MD

## 2015-01-29 NOTE — Patient Instructions (Addendum)
It was good to see you today.  We have reviewed your prior records including labs and tests today  Health Maintenance reviewed - Prevnar 13 immunization updated today - all other recommended immunizations and age-appropriate screenings are up-to-date.  Test(s) ordered today. Your results will be released to [place] (or called to you) after review, usually within 72hours after test completion. If any changes need to be made, you will be notified at that same time.  Medications reviewed and updated, no changes recommended at this time.  Please schedule followup in 6 months for semiannual exam and labs, call sooner if problems.  Health Maintenance A healthy lifestyle and preventative care can promote health and wellness.  Maintain regular health, dental, and eye exams.  Eat a healthy diet. Foods like vegetables, fruits, whole grains, low-fat dairy products, and lean protein foods contain the nutrients you need and are low in calories. Decrease your intake of foods high in solid fats, added sugars, and salt. Get information about a proper diet from your health care provider, if necessary.  Regular physical exercise is one of the most important things you can do for your health. Most adults should get at least 150 minutes of moderate-intensity exercise (any activity that increases your heart rate and causes you to sweat) each week. In addition, most adults need muscle-strengthening exercises on 2 or more days a week.   Maintain a healthy weight. The body mass index (BMI) is a screening tool to identify possible weight problems. It provides an estimate of body fat based on height and weight. Your health care provider can find your BMI and can help you achieve or maintain a healthy weight. For males 20 years and older:  A BMI below 18.5 is considered underweight.  A BMI of 18.5 to 24.9 is normal.  A BMI of 25 to 29.9 is considered overweight.  A BMI of 30 and above is considered  obese.  Maintain normal blood lipids and cholesterol by exercising and minimizing your intake of saturated fat. Eat a balanced diet with plenty of fruits and vegetables. Blood tests for lipids and cholesterol should begin at age 39 and be repeated every 5 years. If your lipid or cholesterol levels are high, you are over age 4, or you are at high risk for heart disease, you may need your cholesterol levels checked more frequently.Ongoing high lipid and cholesterol levels should be treated with medicines if diet and exercise are not working.  If you smoke, find out from your health care provider how to quit. If you do not use tobacco, do not start.  Lung cancer screening is recommended for adults aged 20-80 years who are at high risk for developing lung cancer because of a history of smoking. A yearly low-dose CT scan of the lungs is recommended for people who have at least a 30-pack-year history of smoking and are current smokers or have quit within the past 15 years. A pack year of smoking is smoking an average of 1 pack of cigarettes a day for 1 year (for example, a 30-pack-year history of smoking could mean smoking 1 pack a day for 30 years or 2 packs a day for 15 years). Yearly screening should continue until the smoker has stopped smoking for at least 15 years. Yearly screening should be stopped for people who develop a health problem that would prevent them from having lung cancer treatment.  If you choose to drink alcohol, do not have more than 2 drinks per day. One  drink is considered to be 12 oz (360 mL) of beer, 5 oz (150 mL) of wine, or 1.5 oz (45 mL) of liquor.  Avoid the use of street drugs. Do not share needles with anyone. Ask for help if you need support or instructions about stopping the use of drugs.  High blood pressure causes heart disease and increases the risk of stroke. Blood pressure should be checked at least every 1-2 years. Ongoing high blood pressure should be treated with  medicines if weight loss and exercise are not effective.  If you are 32-92 years old, ask your health care provider if you should take aspirin to prevent heart disease.  Diabetes screening involves taking a blood sample to check your fasting blood sugar level. This should be done once every 3 years after age 9 if you are at a normal weight and without risk factors for diabetes. Testing should be considered at a younger age or be carried out more frequently if you are overweight and have at least 1 risk factor for diabetes.  Colorectal cancer can be detected and often prevented. Most routine colorectal cancer screening begins at the age of 67 and continues through age 43. However, your health care provider may recommend screening at an earlier age if you have risk factors for colon cancer. On a yearly basis, your health care provider may provide home test kits to check for hidden blood in the stool. A small camera at the end of a tube may be used to directly examine the colon (sigmoidoscopy or colonoscopy) to detect the earliest forms of colorectal cancer. Talk to your health care provider about this at age 33 when routine screening begins. A direct exam of the colon should be repeated every 5-10 years through age 11, unless early forms of precancerous polyps or small growths are found.  People who are at an increased risk for hepatitis B should be screened for this virus. You are considered at high risk for hepatitis B if:  You were born in a country where hepatitis B occurs often. Talk with your health care provider about which countries are considered high risk.  Your parents were born in a high-risk country and you have not received a shot to protect against hepatitis B (hepatitis B vaccine).  You have HIV or AIDS.  You use needles to inject street drugs.  You live with, or have sex with, someone who has hepatitis B.  You are a man who has sex with other men (MSM).  You get hemodialysis  treatment.  You take certain medicines for conditions like cancer, organ transplantation, and autoimmune conditions.  Hepatitis C blood testing is recommended for all people born from [birth year] through 1967 and any individual with known risk factors for hepatitis C.  Healthy men should no longer receive prostate-specific antigen (PSA) blood tests as part of routine cancer screening. Talk to your health care provider about prostate cancer screening.  Testicular cancer screening is not recommended for adolescents or adult males who have no symptoms. Screening includes self-exam, a health care provider exam, and other screening tests. Consult with your health care provider about any symptoms you have or any concerns you have about testicular cancer.  Practice safe sex. Use condoms and avoid high-risk sexual practices to reduce the spread of sexually transmitted infections (STIs).  You should be screened for STIs, including gonorrhea and chlamydia if:  You are sexually active and are younger than 24 years.  You are older than 24 years,  and your health care provider tells you that you are at risk for this type of infection.  Your sexual activity has changed since you were last screened, and you are at an increased risk for chlamydia or gonorrhea. Ask your health care provider if you are at risk.  If you are at risk of being infected with HIV, it is recommended that you take a prescription medicine daily to prevent HIV infection. This is called pre-exposure prophylaxis (PrEP). You are considered at risk if:  You are a man who has sex with other men (MSM).  You are a heterosexual man who is sexually active with multiple partners.  You take drugs by injection.  You are sexually active with a partner who has HIV.  Talk with your health care provider about whether you are at high risk of being infected with HIV. If you choose to begin PrEP, you should first be tested for HIV. You should then be tested  every 3 months for as long as you are taking PrEP.  Use sunscreen. Apply sunscreen liberally and repeatedly throughout the day. You should seek shade when your shadow is shorter than you. Protect yourself by wearing long sleeves, pants, a wide-brimmed hat, and sunglasses year round whenever you are outdoors.  Tell your health care provider of new moles or changes in moles, especially if there is a change in shape or color. Also, tell your health care provider if a mole is larger than the size of a pencil eraser.  A one-time screening for abdominal aortic aneurysm (AAA) and surgical repair of large AAAs by ultrasound is recommended for men aged 74-75 years who are current or former smokers.  Stay current with your vaccines (immunizations). Document Released: 04/30/2008 Document Revised: 11/07/2013 Document Reviewed: 03/30/2011 [facility] Patient Information 2015 [place], [place]. This information is not intended to replace advice given to you by your health care provider. Make sure you discuss any questions you have with your health care provider. Diabetes and Standards of Medical Care Diabetes is complicated. You may find that your diabetes team includes a dietitian, nurse, diabetes educator, eye doctor, and more. To help everyone know what is going on and to help you get the care you deserve, the following schedule of care was developed to help keep you on track. Below are the tests, exams, vaccines, medicines, education, and plans you will need. HbA1c test This test shows how well you have controlled your glucose over the past 2-3 months. It is used to see if your diabetes management plan needs to be adjusted.   It is performed at least 2 times a year if you are meeting treatment goals.  It is performed 4 times a year if therapy has changed or if you are not meeting treatment goals. Blood pressure test  This test is performed at every routine medical visit. The goal is less than 140/90 mm Hg for  most people, but 130/80 mm Hg in some cases. Ask your health care provider about your goal. Dental exam  Follow up with the dentist regularly. Eye exam  If you are diagnosed with type 1 diabetes as a child, get an exam upon reaching the age of 80 years or older and have had diabetes for 3-5 years. Yearly eye exams are recommended after that initial eye exam.  If you are diagnosed with type 1 diabetes as an adult, get an exam within 5 years of diagnosis and then yearly.  If you are diagnosed with type 2 diabetes,  get an exam as soon as possible after the diagnosis and then yearly. Foot care exam  Visual foot exams are performed at every routine medical visit. The exams check for cuts, injuries, or other problems with the feet.  A comprehensive foot exam should be done yearly. This includes visual inspection as well as assessing foot pulses and testing for loss of sensation.  Check your feet nightly for cuts, injuries, or other problems with your feet. Tell your health care provider if anything is not healing. Kidney function test (urine microalbumin)  This test is performed once a year.  Type 1 diabetes: The first test is performed 5 years after diagnosis.  Type 2 diabetes: The first test is performed at the time of diagnosis.  A serum creatinine and estimated glomerular filtration rate (eGFR) test is done once a year to assess the level of chronic kidney disease (CKD), if present. Lipid profile (cholesterol, HDL, LDL, triglycerides)  Performed every 5 years for most people.  The goal for LDL is less than 100 mg/dL. If you are at high risk, the goal is less than 70 mg/dL.  The goal for HDL is 40 mg/dL-50 mg/dL for men and 50 mg/dL-60 mg/dL for women. An HDL cholesterol of 60 mg/dL or higher gives some protection against heart disease.  The goal for triglycerides is less than 150 mg/dL. Influenza vaccine, pneumococcal vaccine, and hepatitis B vaccine  The influenza vaccine is  recommended yearly.  It is recommended that people with diabetes who are over 36 years old get the pneumonia vaccine. In some cases, two separate shots may be given. Ask your health care provider if your pneumonia vaccination is up to date.  The hepatitis B vaccine is also recommended for adults with diabetes. Diabetes self-management education  Education is recommended at diagnosis and ongoing as needed. Treatment plan  Your treatment plan is reviewed at every medical visit. Document Released: 08/30/2009 Document Revised: 03/19/2014 Document Reviewed: 04/04/2013 [facility] Patient Information 2015 [place], [place]. This information is not intended to replace advice given to you by your health care provider. Make sure you discuss any questions you have with your health care provider.

## 2015-02-01 ENCOUNTER — Telehealth: Payer: Self-pay | Admitting: Internal Medicine

## 2015-02-01 NOTE — Telephone Encounter (Signed)
Sharyn Lull from [facility] stated that patient said he was  Allergic to the  Thiazide in the hydrochlorothiazide, please advise [phone]

## 2015-02-01 NOTE — Telephone Encounter (Signed)
Lvm for pt to call back.    RE: MD advisement below.

## 2015-02-01 NOTE — Telephone Encounter (Signed)
Hold HCTZ & list under Allergies Increase Lisinopril to 30 mg (1& 1/2 pills/day) & monitor BP

## 2015-02-15 DIAGNOSIS — Z961 Presence of intraocular lens: Secondary | ICD-10-CM | POA: Diagnosis not present

## 2015-02-15 DIAGNOSIS — E119 Type 2 diabetes mellitus without complications: Secondary | ICD-10-CM | POA: Diagnosis not present

## 2015-02-18 ENCOUNTER — Telehealth: Payer: Self-pay

## 2015-02-18 DIAGNOSIS — M47816 Spondylosis without myelopathy or radiculopathy, lumbar region: Secondary | ICD-10-CM | POA: Diagnosis not present

## 2015-02-18 LAB — HM DIABETES EYE EXAM

## 2015-02-18 NOTE — Telephone Encounter (Signed)
Fax received from [facility]. Eye exam report was not easily read.  [facility] and they confirmed the pt name to be Nathan Gomez (not [facility]) and the DOB checked. The exam date is listed at 02/18/2015 but was actually 02/15/2015.  [facility] is sending over a corrected exam report for our records.

## 2015-02-20 DIAGNOSIS — M6281 Muscle weakness (generalized): Secondary | ICD-10-CM | POA: Diagnosis not present

## 2015-02-20 DIAGNOSIS — M545 Low back pain: Secondary | ICD-10-CM | POA: Diagnosis not present

## 2015-02-27 DIAGNOSIS — M6281 Muscle weakness (generalized): Secondary | ICD-10-CM | POA: Diagnosis not present

## 2015-02-27 DIAGNOSIS — M545 Low back pain: Secondary | ICD-10-CM | POA: Diagnosis not present

## 2015-03-05 DIAGNOSIS — M545 Low back pain: Secondary | ICD-10-CM | POA: Diagnosis not present

## 2015-03-05 DIAGNOSIS — M6281 Muscle weakness (generalized): Secondary | ICD-10-CM | POA: Diagnosis not present

## 2015-03-08 ENCOUNTER — Encounter: Payer: Self-pay | Admitting: Internal Medicine

## 2015-03-16 ENCOUNTER — Encounter: Payer: Self-pay | Admitting: Internal Medicine

## 2015-03-18 DIAGNOSIS — M1711 Unilateral primary osteoarthritis, right knee: Secondary | ICD-10-CM | POA: Diagnosis not present

## 2015-03-18 MED ORDER — LORATADINE 10 MG PO TABS: 10.0000 mg | ORAL_TABLET | Freq: Every day | ORAL | Status: DC

## 2015-03-18 MED ORDER — AMLODIPINE BESYLATE 10 MG PO TABS: 10.0000 mg | ORAL_TABLET | Freq: Every day | ORAL | Status: DC

## 2015-03-18 MED ORDER — HYDROCHLOROTHIAZIDE 25 MG PO TABS: 25.0000 mg | ORAL_TABLET | Freq: Every morning | ORAL | Status: DC

## 2015-03-23 ENCOUNTER — Encounter: Payer: Self-pay | Admitting: Internal Medicine

## 2015-03-25 ENCOUNTER — Encounter: Payer: Self-pay | Admitting: Internal Medicine

## 2015-03-25 NOTE — Telephone Encounter (Signed)
As far as I am aware, there is not vaccine for the zika virus. Mosquito avoidance is the only recommendation at this time. Generally also most vaccines take weeks to get into your immunity and 9 days would not be sufficient. In the future would recommend scheduling a visit to discuss need for any vaccinations 1-2 months prior to taking a trip out of country.

## 2015-03-26 DIAGNOSIS — M1611 Unilateral primary osteoarthritis, right hip: Secondary | ICD-10-CM | POA: Diagnosis not present

## 2015-05-06 DIAGNOSIS — M5136 Other intervertebral disc degeneration, lumbar region: Secondary | ICD-10-CM | POA: Diagnosis not present

## 2015-05-06 DIAGNOSIS — M545 Low back pain: Secondary | ICD-10-CM | POA: Diagnosis not present

## 2015-05-06 DIAGNOSIS — M1611 Unilateral primary osteoarthritis, right hip: Secondary | ICD-10-CM | POA: Diagnosis not present

## 2015-05-10 DIAGNOSIS — M545 Low back pain: Secondary | ICD-10-CM | POA: Diagnosis not present

## 2015-05-15 DIAGNOSIS — M1611 Unilateral primary osteoarthritis, right hip: Secondary | ICD-10-CM | POA: Diagnosis not present

## 2015-05-15 DIAGNOSIS — M5136 Other intervertebral disc degeneration, lumbar region: Secondary | ICD-10-CM | POA: Diagnosis not present

## 2015-05-15 DIAGNOSIS — M545 Low back pain: Secondary | ICD-10-CM | POA: Diagnosis not present

## 2015-05-27 DIAGNOSIS — M5416 Radiculopathy, lumbar region: Secondary | ICD-10-CM | POA: Diagnosis not present

## 2015-05-28 ENCOUNTER — Other Ambulatory Visit: Payer: Self-pay | Admitting: Orthopaedic Surgery

## 2015-06-04 DIAGNOSIS — M5416 Radiculopathy, lumbar region: Secondary | ICD-10-CM | POA: Diagnosis not present

## 2015-06-25 DIAGNOSIS — M5416 Radiculopathy, lumbar region: Secondary | ICD-10-CM | POA: Diagnosis not present

## 2015-06-28 ENCOUNTER — Encounter (HOSPITAL_COMMUNITY)
Admission: RE | Admit: 2015-06-28 | Discharge: 2015-06-28 | Disposition: A | Discharge status: Home / Self Care | Payer: Medicare Other | Source: Ambulatory Visit | Attending: Orthopaedic Surgery | Admitting: Orthopaedic Surgery

## 2015-06-28 ENCOUNTER — Encounter (HOSPITAL_COMMUNITY): Payer: Self-pay

## 2015-06-28 DIAGNOSIS — Z87891 Personal history of nicotine dependence: Secondary | ICD-10-CM | POA: Diagnosis not present

## 2015-06-28 DIAGNOSIS — Z79899 Other long term (current) drug therapy: Secondary | ICD-10-CM | POA: Diagnosis not present

## 2015-06-28 DIAGNOSIS — R2689 Other abnormalities of gait and mobility: Secondary | ICD-10-CM | POA: Diagnosis not present

## 2015-06-28 DIAGNOSIS — Z01810 Encounter for preprocedural cardiovascular examination: Secondary | ICD-10-CM | POA: Diagnosis not present

## 2015-06-28 DIAGNOSIS — E119 Type 2 diabetes mellitus without complications: Secondary | ICD-10-CM | POA: Diagnosis not present

## 2015-06-28 DIAGNOSIS — M1611 Unilateral primary osteoarthritis, right hip: Secondary | ICD-10-CM | POA: Insufficient documentation

## 2015-06-28 DIAGNOSIS — E785 Hyperlipidemia, unspecified: Secondary | ICD-10-CM | POA: Diagnosis not present

## 2015-06-28 DIAGNOSIS — Z01818 Encounter for other preprocedural examination: Secondary | ICD-10-CM | POA: Diagnosis not present

## 2015-06-28 DIAGNOSIS — I10 Essential (primary) hypertension: Secondary | ICD-10-CM | POA: Insufficient documentation

## 2015-06-28 DIAGNOSIS — M25551 Pain in right hip: Secondary | ICD-10-CM | POA: Diagnosis not present

## 2015-06-28 DIAGNOSIS — R918 Other nonspecific abnormal finding of lung field: Secondary | ICD-10-CM | POA: Diagnosis not present

## 2015-06-28 DIAGNOSIS — Z0183 Encounter for blood typing: Secondary | ICD-10-CM | POA: Diagnosis not present

## 2015-06-28 DIAGNOSIS — Z01812 Encounter for preprocedural laboratory examination: Secondary | ICD-10-CM | POA: Diagnosis not present

## 2015-06-28 DIAGNOSIS — Z7982 Long term (current) use of aspirin: Secondary | ICD-10-CM | POA: Diagnosis not present

## 2015-06-28 HISTORY — DX: Other complications of anesthesia, initial encounter: T88.59XA

## 2015-06-28 HISTORY — DX: Cardiac murmur, unspecified: R01.1

## 2015-06-28 HISTORY — DX: Adverse effect of unspecified anesthetic, initial encounter: T41.45XA

## 2015-06-28 LAB — TYPE AND SCREEN
ABO/RH(D): O POS
Antibody Screen: NEGATIVE

## 2015-06-28 LAB — URINALYSIS, ROUTINE W REFLEX MICROSCOPIC
Bilirubin Urine: NEGATIVE
Glucose, UA: NEGATIVE mg/dL
Hgb urine dipstick: NEGATIVE
Ketones, ur: NEGATIVE mg/dL
Leukocytes, UA: NEGATIVE
Nitrite: NEGATIVE
Protein, ur: NEGATIVE mg/dL
Specific Gravity, Urine: 1.022 (ref 1.005–1.030)
Urobilinogen, UA: 0.2 mg/dL (ref 0.0–1.0)
pH: 5 (ref 5.0–8.0)

## 2015-06-28 LAB — CBC WITH DIFFERENTIAL/PLATELET
Basophils Absolute: 0 10*3/uL (ref 0.0–0.1)
Basophils Relative: 0 % (ref 0–1)
Eosinophils Absolute: 0.1 10*3/uL (ref 0.0–0.7)
Eosinophils Relative: 1 % (ref 0–5)
HCT: 42.6 % (ref 39.0–52.0)
Hemoglobin: 14 g/dL (ref 13.0–17.0)
Lymphocytes Relative: 31 % (ref 12–46)
Lymphs Abs: 2.1 10*3/uL (ref 0.7–4.0)
MCH: 31.2 pg (ref 26.0–34.0)
MCHC: 32.9 g/dL (ref 30.0–36.0)
MCV: 94.9 fL (ref 78.0–100.0)
Monocytes Absolute: 0.7 10*3/uL (ref 0.1–1.0)
Monocytes Relative: 10 % (ref 3–12)
Neutro Abs: 4.1 10*3/uL (ref 1.7–7.7)
Neutrophils Relative %: 58 % (ref 43–77)
Platelets: 250 10*3/uL (ref 150–400)
RBC: 4.49 MIL/uL (ref 4.22–5.81)
RDW: 13.2 % (ref 11.5–15.5)
WBC: 7 10*3/uL (ref 4.0–10.5)

## 2015-06-28 LAB — BASIC METABOLIC PANEL
Anion gap: 10 (ref 5–15)
BUN: 29 mg/dL — ABNORMAL HIGH (ref 6–20)
CO2: 24 mmol/L (ref 22–32)
Calcium: 9.8 mg/dL (ref 8.9–10.3)
Chloride: 104 mmol/L (ref 101–111)
Creatinine, Ser: 1.43 mg/dL — ABNORMAL HIGH (ref 0.61–1.24)
GFR calc Af Amer: 53 mL/min — ABNORMAL LOW (ref >60)
GFR calc non Af Amer: 46 mL/min — ABNORMAL LOW (ref >60)
Glucose, Bld: 182 mg/dL — ABNORMAL HIGH (ref 65–99)
Potassium: 4 mmol/L (ref 3.5–5.1)
Sodium: 138 mmol/L (ref 135–145)

## 2015-06-28 LAB — PROTIME-INR
INR: 0.99 (ref 0.00–1.49)
Prothrombin Time: 13.3 seconds (ref 11.6–15.2)

## 2015-06-28 LAB — GLUCOSE, CAPILLARY: Glucose-Capillary: 180 mg/dL — ABNORMAL HIGH (ref 65–99)

## 2015-06-28 LAB — ABO/RH: ABO/RH(D): O POS

## 2015-06-28 LAB — SURGICAL PCR SCREEN
MRSA, PCR: NEGATIVE
Staphylococcus aureus: NEGATIVE

## 2015-06-28 LAB — APTT: aPTT: 29 seconds (ref 24–37)

## 2015-06-28 NOTE — Pre-Procedure Instructions (Addendum)
Nathan Gomez  06/28/2015      [facility] - [place], [place] [address] [place] [address] Phone: [phone] Fax: [phone]  [facility] [place] [place] - [address] [place] ([place]) [place] [address] Phone: [phone] Fax: [phone]  [facility] [place], [place] [address] Phone: [phone] Fax: [phone]    Your procedure is scheduled on 07/09/15  Report to [facility] cone short stay admitting at 530 A.M.  Call this number if you have problems the morning of surgery:  [phone]   Remember:  Do not eat food or drink liquids after midnight.  Take these medicines the morning of surgery with A SIP OF WATER amlodipine.   STOP all herbel meds, nsaids (aleve,naproxen,advil,ibuprofen) 5 days prior to surgery 07/04/15 including all vitamins(vit D,multi vit, magnesium,) probiotic, krill oil, glucosamine, aspirin, niacin  How to Manage Your Diabetes Before Surgery   Why is it important to control my blood sugar before and after surgery?   Improving blood sugar levels before and after surgery helps healing and can limit problems.  A way of improving blood sugar control is eating a healthy diet by:  - Eating less sugar and carbohydrates  - Increasing activity/exercise  - Talk with your doctor about reaching your blood sugar goals  High blood sugars (greater than 180 mg/dL) can raise your risk of infections and slow down your recovery so you will need to focus on controlling your diabetes during the weeks before surgery.  Make sure that the doctor who takes care of your diabetes knows about your planned surgery including the date and location.  How do I manage my blood sugars before surgery?   Check your blood sugar at least 4 times a day, 2 days before surgery to make sure that  they are not too high or low.   Check your blood sugar the morning of your surgery when you wake up and every 2 hours until you get to the Short-Stay unit.  If your blood sugar is less than 70 mg/dL, you will need to treat for low blood sugar by:  Treat a low blood sugar (less than 70 mg/dL) with 1/2 cup of clear juice (cranberry or apple), 4 glucose tablets, OR glucose gel.  Recheck blood sugar in 15 minutes after treatment (to make sure it is greater than 70 mg/dL).  If blood sugar is not greater than 70 mg/dL on re-check, call [phone] for further instructions.   Report your blood sugar to the Short-Stay nurse when you get to Short-Stay.  References:  [facility], 2007 "How to Manage your Diabetes Before and After Surgery".  What do I do about my diabetes medications?   Do not take oral diabetes medicines (pills) the morning of surgery. (metformin)    Do not wear jewelry, make-up or nail polish.  Do not wear lotions, powders, or perfumes.  You may wear deodorant.  Do not shave 48 hours prior to surgery.  Men may shave face and neck.  Do not bring valuables to the hospital.  [facility] is not responsible for any belongings or valuables.  Contacts, dentures or bridgework may not be worn into surgery.  Leave your suitcase in the car.  After surgery it may be brought to your room.  For patients admitted to  the hospital, discharge time will be determined by your treatment team.  Patients discharged the day of surgery will not be allowed to drive home.   Name and phone number of your driver:    Special instructions:   Special Instructions: [place] - Preparing for Surgery  Before surgery, you can play an important role.  Because skin is not sterile, your skin needs to be as free of germs as possible.  You can reduce the number of germs on you skin by washing with CHG (chlorahexidine gluconate) soap before surgery.  CHG is an antiseptic cleaner which  kills germs and bonds with the skin to continue killing germs even after washing.  Please DO NOT use if you have an allergy to CHG or antibacterial soaps.  If your skin becomes reddened/irritated stop using the CHG and inform your nurse when you arrive at Short Stay.  Do not shave (including legs and underarms) for at least 48 hours prior to the first CHG shower.  You may shave your face.  Please follow these instructions carefully:   1.  Shower with CHG Soap the night before surgery and the morning of Surgery.  2.  If you choose to wash your hair, wash your hair first as usual with your normal shampoo.  3.  After you shampoo, rinse your hair and body thoroughly to remove the Shampoo.  4.  Use CHG as you would any other liquid soap.  You can apply chg directly  to the skin and wash gently with scrungie or a clean washcloth.  5.  Apply the CHG Soap to your body ONLY FROM THE NECK DOWN.  Do not use on open wounds or open sores.  Avoid contact with your eyes ears, mouth and genitals (private parts).  Wash genitals (private parts)       with your normal soap.  6.  Wash thoroughly, paying special attention to the area where your surgery will be performed.  7.  Thoroughly rinse your body with warm water from the neck down.  8.  DO NOT shower/wash with your normal soap after using and rinsing off the CHG Soap.  9.  Pat yourself dry with a clean towel.            10.  Wear clean pajamas.            11.  Place clean sheets on your bed the night of your first shower and do not sleep with pets.  Day of Surgery  Do not apply any lotions/deodorants the morning of surgery.  Please wear clean clothes to the hospital/surgery center.  Please read over the following fact sheets that you were given. Pain Booklet, Coughing and Deep Breathing, Blood Transfusion Information, MRSA Information and Surgical Site Infection Prevention

## 2015-06-29 LAB — HEMOGLOBIN A1C
Hgb A1c MFr Bld: 7.5 % — ABNORMAL HIGH (ref 4.8–5.6)
Mean Plasma Glucose: 169 mg/dL

## 2015-07-01 ENCOUNTER — Encounter (HOSPITAL_COMMUNITY): Payer: Self-pay

## 2015-07-01 NOTE — Progress Notes (Signed)
Anesthesia Chart Review:  Pt is 74 year old male scheduled for R total hip arthroplasty on 07/09/2015 with Dr. Rhona Raider.   PCP is Dr. Gwendolyn Grant.   PMH includes: HTN, DM, dyslipidemia, heart murmur (as a child). Former smoker. BMI 26.  Anesthesia history includes post-op nausea and vomiting.   Medications include: amlodipine, ASA, lipitor, hctz, niacin, lisinopril, metformin, prilosec, cialis.   Preoperative labs reviewed.  HgbA1c 7.3, glucose 182.   Chest x-ray 06/28/2015 reviewed. Mild bibasilar subsegmental atelectasis and/or scarring. Minimal basilar infiltrates cannot be excluded. Similar findings noted on 02/22/2012.  EKG 06/28/2015: NSR.   If no changes, I anticipate pt can proceed with surgery as scheduled.   Willeen Cass, FNP-BC [facility] Short Stay Surgical Center/Anesthesiology Phone: [phone] 07/01/2015 2:58 PM

## 2015-07-05 NOTE — H&P (Signed)
TOTAL HIP ADMISSION H&P  Patient is admitted for right total hip arthroplasty.  Subjective:  Chief Complaint: right hip pain  HPI: Nathan Gomez, 74 y.o. male, has a history of pain and functional disability in the right hip(s) due to arthritis and patient has failed non-surgical conservative treatments for greater than 12 weeks to include NSAID's and/or analgesics, corticosteriod injections, use of assistive devices, weight reduction as appropriate and activity modification.  Onset of symptoms was gradual starting 6 years ago with gradually worsening course since that time.The patient noted no past surgery on the right hip(s).  Patient currently rates pain in the right hip at 9 out of 10 with activity. Patient has night pain, worsening of pain with activity and weight bearing, trendelenberg gait, pain that interfers with activities of daily living and pain with passive range of motion. Patient has evidence of periarticular osteophytes and joint space narrowing by imaging studies. This condition presents safety issues increasing the risk of falls. This patient has had no.  There is no current active infection.  Patient Active Problem List   Diagnosis Date Noted  . Personal history of colonic adenomas 01/16/2013  . Pernicious anemia 02/22/2012  . Anemia   . Diabetes mellitus type 2, controlled 01/05/2011  . DYSLIPIDEMIA 01/05/2011  . Essential hypertension 01/05/2011  . OSTEOARTHRITIS 01/05/2011   Past Medical History  Diagnosis Date  . Hypertension   . Diabetes mellitus, type 2   . Dyslipidemia   . Osteoarthritis     right knee  . GERD (gastroesophageal reflux disease)   . Personal history of colonic adenomas 01/16/2013  . 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 Complication of anesthesia   . PONV (postoperative nausea and vomiting)   . Heart murmur     child    Past Surgical History  Procedure Laterality Date  . Tonsillectomy  1945  . Total hip arthroplasty  2011     Left  . Cataract extraction  08/2012 L, 09/2012 R  . Colonoscopy    . Tonsillectomy      No prescriptions prior to admission   Allergies  Allergen Reactions  . Thiazide-Type Diuretics Other (See Comments)    Social History  Substance Use Topics  . Smoking status: Former Smoker -- 1.00 packs/day for 20 years    Quit date: 11/16/1986  . Smokeless tobacco: Never Used  . Alcohol Use: 1.8 oz/week    3 Glasses of wine per week    Family History  Problem Relation Age of Onset  . Ovarian cancer Mother   . Hypertension Father   . Diabetes Maternal Grandfather   . Pneumonia Paternal Grandfather      Review of Systems  All other systems reviewed and are negative.   Objective:  Physical Exam  Constitutional: He is oriented to person, place, and time. He appears well-developed.  HENT:  Head: Normocephalic.  Eyes: Pupils are equal, round, and reactive to light.  Neck: Neck supple.  Cardiovascular: Normal heart sounds.   Respiratory: Effort normal.  GI: Soft.  Musculoskeletal:  Pain with ambulation.  Decreased right rotation both in internal and external very painful.  Leg lengths grossly equal sensory motor function normal  Neurological: He is oriented to person, place, and time.  Skin: Skin is dry.  Psychiatric: He has a normal mood and affect.    Vital signs in last 24 hours:    Labs:   Estimated body mass index is 25.83 kg/(m^2) as calculated from the following:  Height as of 01/29/15: 6' (1.829 m).   Weight as of 01/29/15: 86.41 kg (190 lb 8 oz).   Imaging Review Plain radiographs demonstrate severe degenerative joint disease of the right hip(s). The bone quality appears to be good for age and reported activity level.  Assessment/Primary end-stage arthritis, right hip(s)  The patient history, physical examination, clinical judgement of the provider and imaging studies are consistent with end stage degenerative joint disease of the right hip(s) and total hip  arthroplasty is deemed medically necessary. The treatment options including medical management, injection therapy, arthroscopy and arthroplasty were discussed at length. The risks and benefits of total hip arthroplasty were presented and reviewed. The risks due to aseptic loosening, infection, stiffness, dislocation/subluxation,  thromboembolic complications and other imponderables were discussed.  The patient acknowledged the explanation, agreed to proceed with the plan and consent was signed. Patient is being admitted for inpatient treatment for surgery, pain control, PT, OT, prophylactic antibiotics, VTE prophylaxis, progressive ambulation and ADL's and discharge planning.The patient is planning to be discharged home with home health services

## 2015-07-08 MED ORDER — LACTATED RINGERS IV SOLN
INTRAVENOUS | Status: DC
Start: 2015-07-09 — End: 2015-07-11
  Administered 2015-07-09 (×2): via INTRAVENOUS

## 2015-07-08 MED ORDER — CHLORHEXIDINE GLUCONATE 4 % EX LIQD: 60.0000 mL | Freq: Once | CUTANEOUS | Status: DC

## 2015-07-08 MED ORDER — CEFAZOLIN SODIUM-DEXTROSE 2-3 GM-% IV SOLR
2.0000 g | INTRAVENOUS | Status: AC
  Administered 2015-07-09: 2 g via INTRAVENOUS
  Filled 2015-07-08: qty 50

## 2015-07-09 ENCOUNTER — Inpatient Hospital Stay (HOSPITAL_COMMUNITY): Payer: Medicare Other | Admitting: Emergency Medicine

## 2015-07-09 ENCOUNTER — Inpatient Hospital Stay (HOSPITAL_COMMUNITY): Payer: Medicare Other | Admitting: Certified Registered Nurse Anesthetist

## 2015-07-09 ENCOUNTER — Encounter (HOSPITAL_COMMUNITY): Payer: Self-pay | Admitting: Certified Registered Nurse Anesthetist

## 2015-07-09 ENCOUNTER — Inpatient Hospital Stay (HOSPITAL_COMMUNITY)
Admission: RE | Admit: 2015-07-09 | Discharge: 2015-07-11 | DRG: 470 | Disposition: A | Discharge status: Home Health | Payer: Medicare Other | Source: Ambulatory Visit | Attending: Orthopaedic Surgery | Admitting: Orthopaedic Surgery

## 2015-07-09 ENCOUNTER — Encounter (HOSPITAL_COMMUNITY)
Admission: RE | Disposition: A | Discharge status: Home Health | Payer: Self-pay | Source: Ambulatory Visit | Attending: Orthopaedic Surgery

## 2015-07-09 ENCOUNTER — Inpatient Hospital Stay (HOSPITAL_COMMUNITY): Payer: Medicare Other

## 2015-07-09 DIAGNOSIS — Z833 Family history of diabetes mellitus: Secondary | ICD-10-CM | POA: Diagnosis not present

## 2015-07-09 DIAGNOSIS — Z471 Aftercare following joint replacement surgery: Secondary | ICD-10-CM | POA: Diagnosis not present

## 2015-07-09 DIAGNOSIS — E785 Hyperlipidemia, unspecified: Secondary | ICD-10-CM | POA: Diagnosis present

## 2015-07-09 DIAGNOSIS — Z87891 Personal history of nicotine dependence: Secondary | ICD-10-CM | POA: Diagnosis not present

## 2015-07-09 DIAGNOSIS — M1611 Unilateral primary osteoarthritis, right hip: Secondary | ICD-10-CM | POA: Diagnosis not present

## 2015-07-09 DIAGNOSIS — Z96642 Presence of left artificial hip joint: Secondary | ICD-10-CM | POA: Diagnosis not present

## 2015-07-09 DIAGNOSIS — I10 Essential (primary) hypertension: Secondary | ICD-10-CM | POA: Diagnosis present

## 2015-07-09 DIAGNOSIS — K219 Gastro-esophageal reflux disease without esophagitis: Secondary | ICD-10-CM | POA: Diagnosis present

## 2015-07-09 DIAGNOSIS — E119 Type 2 diabetes mellitus without complications: Secondary | ICD-10-CM | POA: Diagnosis not present

## 2015-07-09 DIAGNOSIS — Z419 Encounter for procedure for purposes other than remedying health state, unspecified: Secondary | ICD-10-CM

## 2015-07-09 DIAGNOSIS — Z8249 Family history of ischemic heart disease and other diseases of the circulatory system: Secondary | ICD-10-CM

## 2015-07-09 DIAGNOSIS — Z8041 Family history of malignant neoplasm of ovary: Secondary | ICD-10-CM

## 2015-07-09 DIAGNOSIS — M1711 Unilateral primary osteoarthritis, right knee: Secondary | ICD-10-CM | POA: Diagnosis present

## 2015-07-09 DIAGNOSIS — Z96641 Presence of right artificial hip joint: Secondary | ICD-10-CM | POA: Diagnosis not present

## 2015-07-09 DIAGNOSIS — M25551 Pain in right hip: Secondary | ICD-10-CM | POA: Diagnosis not present

## 2015-07-09 HISTORY — PX: TOTAL HIP ARTHROPLASTY: SHX124

## 2015-07-09 LAB — BASIC METABOLIC PANEL
Anion gap: 10 (ref 5–15)
BUN: 19 mg/dL (ref 6–20)
CO2: 23 mmol/L (ref 22–32)
Calcium: 8.9 mg/dL (ref 8.9–10.3)
Chloride: 104 mmol/L (ref 101–111)
Creatinine, Ser: 1.36 mg/dL — ABNORMAL HIGH (ref 0.61–1.24)
GFR calc Af Amer: 57 mL/min — ABNORMAL LOW (ref >60)
GFR calc non Af Amer: 49 mL/min — ABNORMAL LOW (ref >60)
Glucose, Bld: 162 mg/dL — ABNORMAL HIGH (ref 65–99)
Potassium: 4.3 mmol/L (ref 3.5–5.1)
Sodium: 137 mmol/L (ref 135–145)

## 2015-07-09 LAB — GLUCOSE, CAPILLARY
Glucose-Capillary: 151 mg/dL — ABNORMAL HIGH (ref 65–99)
Glucose-Capillary: 119 mg/dL — ABNORMAL HIGH (ref 65–99)
Glucose-Capillary: 193 mg/dL — ABNORMAL HIGH (ref 65–99)

## 2015-07-09 SURGERY — ARTHROPLASTY, HIP, TOTAL, ANTERIOR APPROACH
Anesthesia: Spinal | Site: Hip | Laterality: Right

## 2015-07-09 MED ORDER — FLEET ENEMA 7-19 GM/118ML RE ENEM: 1.0000 | ENEMA | Freq: Once | RECTAL | Status: DC | PRN

## 2015-07-09 MED ORDER — HYDROMORPHONE HCL 1 MG/ML IJ SOLN: 0.2500 mg | INTRAMUSCULAR | Status: DC | PRN

## 2015-07-09 MED ORDER — ACETAMINOPHEN 650 MG RE SUPP: 650.0000 mg | Freq: Four times a day (QID) | RECTAL | Status: DC | PRN

## 2015-07-09 MED ORDER — PROMETHAZINE HCL 25 MG/ML IJ SOLN: 6.2500 mg | INTRAMUSCULAR | Status: DC | PRN

## 2015-07-09 MED ORDER — PHENOL 1.4 % MT LIQD: 1.0000 | OROMUCOSAL | Status: DC | PRN

## 2015-07-09 MED ORDER — ONDANSETRON HCL 4 MG/2ML IJ SOLN: 4.0000 mg | Freq: Four times a day (QID) | INTRAMUSCULAR | Status: DC | PRN

## 2015-07-09 MED ORDER — HYDROMORPHONE HCL 1 MG/ML IJ SOLN
1.0000 mg | INTRAMUSCULAR | Status: DC | PRN
  Administered 2015-07-09 (×2): 1 mg via INTRAVENOUS
  Filled 2015-07-09 (×2): qty 1

## 2015-07-09 MED ORDER — PROPOFOL INFUSION 10 MG/ML OPTIME
INTRAVENOUS | Status: DC | PRN
  Administered 2015-07-09: 50 ug/kg/min via INTRAVENOUS

## 2015-07-09 MED ORDER — BUPIVACAINE IN DEXTROSE 0.75-8.25 % IT SOLN
INTRATHECAL | Status: DC | PRN
  Administered 2015-07-09: 2 mL via INTRATHECAL

## 2015-07-09 MED ORDER — DOCUSATE SODIUM 100 MG PO CAPS
100.0000 mg | ORAL_CAPSULE | Freq: Two times a day (BID) | ORAL | Status: DC
  Administered 2015-07-09 – 2015-07-11 (×4): 100 mg via ORAL
  Filled 2015-07-09 (×4): qty 1

## 2015-07-09 MED ORDER — LACTATED RINGERS IV SOLN
INTRAVENOUS | Status: DC
  Administered 2015-07-09 (×2): via INTRAVENOUS

## 2015-07-09 MED ORDER — ASPIRIN EC 325 MG PO TBEC
325.0000 mg | DELAYED_RELEASE_TABLET | Freq: Two times a day (BID) | ORAL | Status: DC
  Administered 2015-07-09 – 2015-07-11 (×4): 325 mg via ORAL
  Filled 2015-07-09 (×4): qty 1

## 2015-07-09 MED ORDER — FENTANYL CITRATE (PF) 250 MCG/5ML IJ SOLN
INTRAMUSCULAR | Status: DC | PRN
  Administered 2015-07-09 (×3): 50 ug via INTRAVENOUS

## 2015-07-09 MED ORDER — PHENYLEPHRINE HCL 10 MG/ML IJ SOLN
10.0000 mg | INTRAVENOUS | Status: DC | PRN
  Administered 2015-07-09: 10 ug/min via INTRAVENOUS

## 2015-07-09 MED ORDER — PANTOPRAZOLE SODIUM 40 MG PO TBEC
40.0000 mg | DELAYED_RELEASE_TABLET | Freq: Every day | ORAL | Status: DC
  Administered 2015-07-10 – 2015-07-11 (×2): 40 mg via ORAL
  Filled 2015-07-09 (×3): qty 1

## 2015-07-09 MED ORDER — NIACIN ER 500 MG PO TBCR
500.0000 mg | EXTENDED_RELEASE_TABLET | Freq: Every day | ORAL | Status: DC
Start: 2015-07-09 — End: 2015-07-11
  Administered 2015-07-09 – 2015-07-10 (×2): 500 mg via ORAL
  Filled 2015-07-09 (×3): qty 1

## 2015-07-09 MED ORDER — ADULT MULTIVITAMIN W/MINERALS CH
1.0000 | ORAL_TABLET | Freq: Every day | ORAL | Status: DC
Start: 2015-07-09 — End: 2015-07-11
  Administered 2015-07-10 – 2015-07-11 (×2): 1 via ORAL
  Filled 2015-07-09 (×2): qty 1

## 2015-07-09 MED ORDER — INSULIN ASPART 100 UNIT/ML ~~LOC~~ SOLN
0.0000 [IU] | Freq: Three times a day (TID) | SUBCUTANEOUS | Status: DC
  Administered 2015-07-09 – 2015-07-10 (×2): 3 [IU] via SUBCUTANEOUS
  Administered 2015-07-10: 5 [IU] via SUBCUTANEOUS
  Administered 2015-07-10: 3 [IU] via SUBCUTANEOUS
  Administered 2015-07-11: 2 [IU] via SUBCUTANEOUS

## 2015-07-09 MED ORDER — RISAQUAD PO CAPS
2.0000 | ORAL_CAPSULE | Freq: Every day | ORAL | Status: DC
Start: 2015-07-09 — End: 2015-07-11
  Administered 2015-07-10 – 2015-07-11 (×2): 2 via ORAL
  Filled 2015-07-09 (×3): qty 2

## 2015-07-09 MED ORDER — METHOCARBAMOL 500 MG PO TABS
500.0000 mg | ORAL_TABLET | Freq: Four times a day (QID) | ORAL | Status: DC | PRN
  Filled 2015-07-09: qty 1

## 2015-07-09 MED ORDER — FENTANYL CITRATE (PF) 250 MCG/5ML IJ SOLN
INTRAMUSCULAR | Status: AC
  Filled 2015-07-09: qty 5

## 2015-07-09 MED ORDER — MAGNESIUM OXIDE 400 (241.3 MG) MG PO TABS
400.0000 mg | ORAL_TABLET | Freq: Two times a day (BID) | ORAL | Status: DC
  Administered 2015-07-09 – 2015-07-11 (×4): 400 mg via ORAL
  Filled 2015-07-09 (×4): qty 1

## 2015-07-09 MED ORDER — PROPOFOL 10 MG/ML IV BOLUS
INTRAVENOUS | Status: AC
  Filled 2015-07-09: qty 20

## 2015-07-09 MED ORDER — MIDAZOLAM HCL 2 MG/2ML IJ SOLN
INTRAMUSCULAR | Status: AC
  Filled 2015-07-09: qty 4

## 2015-07-09 MED ORDER — LISINOPRIL 20 MG PO TABS
20.0000 mg | ORAL_TABLET | Freq: Every day | ORAL | Status: DC
  Administered 2015-07-10 – 2015-07-11 (×2): 20 mg via ORAL
  Filled 2015-07-09 (×2): qty 1

## 2015-07-09 MED ORDER — LORATADINE 10 MG PO TABS
10.0000 mg | ORAL_TABLET | Freq: Every day | ORAL | Status: DC
  Administered 2015-07-10 – 2015-07-11 (×2): 10 mg via ORAL
  Filled 2015-07-09 (×2): qty 1

## 2015-07-09 MED ORDER — HYDROCHLOROTHIAZIDE 25 MG PO TABS
25.0000 mg | ORAL_TABLET | Freq: Every morning | ORAL | Status: DC
  Administered 2015-07-10 – 2015-07-11 (×2): 25 mg via ORAL
  Filled 2015-07-09 (×2): qty 1

## 2015-07-09 MED ORDER — METOCLOPRAMIDE HCL 5 MG PO TABS: 5.0000 mg | ORAL_TABLET | Freq: Three times a day (TID) | ORAL | Status: DC | PRN

## 2015-07-09 MED ORDER — CEFAZOLIN SODIUM-DEXTROSE 2-3 GM-% IV SOLR
2.0000 g | Freq: Four times a day (QID) | INTRAVENOUS | Status: AC
  Administered 2015-07-09 (×2): 2 g via INTRAVENOUS
  Filled 2015-07-09 (×2): qty 50

## 2015-07-09 MED ORDER — PROPOFOL 10 MG/ML IV BOLUS
INTRAVENOUS | Status: DC | PRN
  Administered 2015-07-09: 20 mg via INTRAVENOUS

## 2015-07-09 MED ORDER — MENTHOL 3 MG MT LOZG: 1.0000 | LOZENGE | OROMUCOSAL | Status: DC | PRN

## 2015-07-09 MED ORDER — SENNOSIDES-DOCUSATE SODIUM 8.6-50 MG PO TABS: 1.0000 | ORAL_TABLET | Freq: Every evening | ORAL | Status: DC | PRN

## 2015-07-09 MED ORDER — METHOCARBAMOL 1000 MG/10ML IJ SOLN
500.0000 mg | Freq: Four times a day (QID) | INTRAVENOUS | Status: DC | PRN
  Filled 2015-07-09: qty 5

## 2015-07-09 MED ORDER — 0.9 % SODIUM CHLORIDE (POUR BTL) OPTIME
TOPICAL | Status: DC | PRN
  Administered 2015-07-09: 1000 mL

## 2015-07-09 MED ORDER — CYCLOBENZAPRINE HCL 10 MG PO TABS
5.0000 mg | ORAL_TABLET | Freq: Three times a day (TID) | ORAL | Status: DC | PRN
  Administered 2015-07-09 – 2015-07-10 (×5): 5 mg via ORAL
  Filled 2015-07-09 (×5): qty 1

## 2015-07-09 MED ORDER — VITAMIN D 1000 UNITS PO TABS
2000.0000 [IU] | ORAL_TABLET | ORAL | Status: DC
Start: 2015-07-09 — End: 2015-07-11
  Administered 2015-07-10 – 2015-07-11 (×2): 2000 [IU] via ORAL
  Filled 2015-07-09 (×2): qty 2

## 2015-07-09 MED ORDER — MIDAZOLAM HCL 2 MG/2ML IJ SOLN
INTRAMUSCULAR | Status: DC | PRN
  Administered 2015-07-09: 2 mg via INTRAVENOUS

## 2015-07-09 MED ORDER — TRANEXAMIC ACID 1000 MG/10ML IV SOLN
1000.0000 mg | INTRAVENOUS | Status: AC
  Administered 2015-07-09: 1000 mg via INTRAVENOUS
  Filled 2015-07-09: qty 10

## 2015-07-09 MED ORDER — ONDANSETRON HCL 4 MG PO TABS: 4.0000 mg | ORAL_TABLET | Freq: Four times a day (QID) | ORAL | Status: DC | PRN

## 2015-07-09 MED ORDER — FLUTICASONE PROPIONATE 50 MCG/ACT NA SUSP
1.0000 | Freq: Every day | NASAL | Status: DC
  Administered 2015-07-10 – 2015-07-11 (×2): 1 via NASAL
  Filled 2015-07-09: qty 16

## 2015-07-09 MED ORDER — AMLODIPINE BESYLATE 10 MG PO TABS
10.0000 mg | ORAL_TABLET | Freq: Every day | ORAL | Status: DC
  Administered 2015-07-10 – 2015-07-11 (×2): 10 mg via ORAL
  Filled 2015-07-09 (×2): qty 1

## 2015-07-09 MED ORDER — METFORMIN HCL ER 500 MG PO TB24
1000.0000 mg | ORAL_TABLET | Freq: Two times a day (BID) | ORAL | Status: DC
  Administered 2015-07-10 – 2015-07-11 (×3): 1000 mg via ORAL
  Filled 2015-07-09 (×3): qty 2

## 2015-07-09 MED ORDER — ACETAMINOPHEN 325 MG PO TABS: 650.0000 mg | ORAL_TABLET | Freq: Four times a day (QID) | ORAL | Status: DC | PRN

## 2015-07-09 MED ORDER — PHENYLEPHRINE HCL 10 MG/ML IJ SOLN
INTRAMUSCULAR | Status: DC | PRN
  Administered 2015-07-09 (×2): 40 ug via INTRAVENOUS

## 2015-07-09 MED ORDER — HYDROCODONE-ACETAMINOPHEN 5-325 MG PO TABS
1.0000 | ORAL_TABLET | ORAL | Status: DC | PRN
  Administered 2015-07-09 – 2015-07-11 (×9): 2 via ORAL
  Filled 2015-07-09 (×10): qty 2

## 2015-07-09 MED ORDER — ATORVASTATIN CALCIUM 10 MG PO TABS
20.0000 mg | ORAL_TABLET | Freq: Every day | ORAL | Status: DC
  Administered 2015-07-10 – 2015-07-11 (×2): 20 mg via ORAL
  Filled 2015-07-09 (×3): qty 2

## 2015-07-09 MED ORDER — ALUM & MAG HYDROXIDE-SIMETH 200-200-20 MG/5ML PO SUSP: 30.0000 mL | ORAL | Status: DC | PRN

## 2015-07-09 MED ORDER — METOCLOPRAMIDE HCL 5 MG/ML IJ SOLN: 5.0000 mg | Freq: Three times a day (TID) | INTRAMUSCULAR | Status: DC | PRN

## 2015-07-09 MED ORDER — FERROUS SULFATE 325 (65 FE) MG PO TABS
325.0000 mg | ORAL_TABLET | Freq: Two times a day (BID) | ORAL | Status: DC
  Administered 2015-07-09 – 2015-07-11 (×4): 325 mg via ORAL
  Filled 2015-07-09 (×5): qty 1

## 2015-07-09 SURGICAL SUPPLY — 49 items
BLADE SAW SGTL 18X1.27X75 (BLADE) ×2 IMPLANT
BLADE SURG ROTATE 9660 (MISCELLANEOUS) IMPLANT
CAPT HIP TOTAL 2 ×1 IMPLANT
CELLS DAT CNTRL 66122 CELL SVR (MISCELLANEOUS) ×1 IMPLANT
COVER PERINEAL POST (MISCELLANEOUS) ×2 IMPLANT
COVER SURGICAL LIGHT HANDLE (MISCELLANEOUS) ×2 IMPLANT
DRAPE C-ARM 42X72 X-RAY (DRAPES) ×2 IMPLANT
DRAPE IMP U-DRAPE 54X76 (DRAPES) ×2 IMPLANT
DRAPE STERI IOBAN 125X83 (DRAPES) ×2 IMPLANT
DRAPE U-SHAPE 47X51 STRL (DRAPES) ×6 IMPLANT
DRSG AQUACEL AG ADV 3.5X10 (GAUZE/BANDAGES/DRESSINGS) ×2 IMPLANT
DURAPREP 26ML APPLICATOR (WOUND CARE) ×2 IMPLANT
ELECT BLADE 4.0 EZ CLEAN MEGAD (MISCELLANEOUS) ×2
ELECT CAUTERY BLADE 6.4 (BLADE) ×2 IMPLANT
ELECT REM PT RETURN 9FT ADLT (ELECTROSURGICAL) ×2
ELECTRODE BLDE 4.0 EZ CLN MEGD (MISCELLANEOUS) IMPLANT
ELECTRODE REM PT RTRN 9FT ADLT (ELECTROSURGICAL) ×1 IMPLANT
FACESHIELD WRAPAROUND (MASK) ×2 IMPLANT
FACESHIELD WRAPAROUND OR TEAM (MASK) ×2 IMPLANT
GLOVE BIO SURGEON STRL SZ8 (GLOVE) ×10 IMPLANT
GLOVE BIOGEL PI IND STRL 8 (GLOVE) ×2 IMPLANT
GLOVE BIOGEL PI INDICATOR 8 (GLOVE) ×2
GOWN STRL REUS W/ TWL LRG LVL3 (GOWN DISPOSABLE) ×1 IMPLANT
GOWN STRL REUS W/ TWL XL LVL3 (GOWN DISPOSABLE) ×2 IMPLANT
GOWN STRL REUS W/TWL LRG LVL3 (GOWN DISPOSABLE) ×2
GOWN STRL REUS W/TWL XL LVL3 (GOWN DISPOSABLE) ×4
KIT BASIN OR (CUSTOM PROCEDURE TRAY) ×2 IMPLANT
KIT ROOM TURNOVER OR (KITS) ×2 IMPLANT
LINER BOOT UNIVERSAL DISP (MISCELLANEOUS) ×1 IMPLANT
MANIFOLD NEPTUNE II (INSTRUMENTS) ×2 IMPLANT
NS IRRIG 1000ML POUR BTL (IV SOLUTION) ×2 IMPLANT
PACK TOTAL JOINT (CUSTOM PROCEDURE TRAY) ×2 IMPLANT
PACK UNIVERSAL I (CUSTOM PROCEDURE TRAY) ×2 IMPLANT
PAD ARMBOARD 7.5X6 YLW CONV (MISCELLANEOUS) ×4 IMPLANT
RETRACTOR WND ALEXIS 18 MED (MISCELLANEOUS) ×1 IMPLANT
RTRCTR WOUND ALEXIS 18CM MED (MISCELLANEOUS) ×2
STAPLER VISISTAT 35W (STAPLE) ×2 IMPLANT
SUT ETHIBOND NAB CT1 #1 30IN (SUTURE) ×6 IMPLANT
SUT VIC AB 0 CT1 27 (SUTURE) ×2
SUT VIC AB 0 CT1 27XBRD ANBCTR (SUTURE) IMPLANT
SUT VIC AB 1 CT1 27 (SUTURE) ×2
SUT VIC AB 1 CT1 27XBRD ANBCTR (SUTURE) ×1 IMPLANT
SUT VIC AB 2-0 CT1 27 (SUTURE) ×2
SUT VIC AB 2-0 CT1 TAPERPNT 27 (SUTURE) ×1 IMPLANT
SUT VLOC 180 0 24IN GS25 (SUTURE) ×2 IMPLANT
TOWEL OR 17X24 6PK STRL BLUE (TOWEL DISPOSABLE) ×2 IMPLANT
TOWEL OR 17X26 10 PK STRL BLUE (TOWEL DISPOSABLE) ×4 IMPLANT
TRAY FOLEY CATH 14FR (SET/KITS/TRAYS/PACK) IMPLANT
WATER STERILE IRR 1000ML POUR (IV SOLUTION) ×2 IMPLANT

## 2015-07-09 NOTE — Transfer of Care (Signed)
Immediate Anesthesia Transfer of Care Note  Patient: Nathan Gomez  Procedure(s) Performed: Procedure(s): TOTAL HIP ARTHROPLASTY ANTERIOR APPROACH (Right)  Patient Location: PACU  Anesthesia Type:Spinal  Level of Consciousness: awake, alert , oriented, patient cooperative and responds to stimulation  Airway & Oxygen Therapy: Patient Spontanous Breathing and Patient connected to nasal cannula oxygen  Post-op Assessment: Report given to RN, Post -op Vital signs reviewed and stable and Patient able to stick tongue midline  Post vital signs: stable  Last Vitals:  Filed Vitals:   07/09/15 0622  BP: 138/72  Pulse: 80  Temp: 36.1 C  Resp: 20    Complications: No apparent anesthesia complications

## 2015-07-09 NOTE — Evaluation (Signed)
Physical Therapy Evaluation Patient Details Name: Nathan Gomez MRN: [id number] DOB: October 12, 1941 Today's Date: 07/09/2015   History of Present Illness  Pt is a 74 y/o M s/p Rt THA.  Pt's PMH includes anemia, DM II, HTN, Lt THA.  Clinical Impression  Pt is s/p Rt THA resulting in the deficits listed below (see PT Problem List). Mr. Carothers ambulated in the hallway this session w/ min guard assist, limited by onset of lightheadedness which dissipated quickly upon returning to room and sitting.  He will have 24/7 assist from his wife at home. Pt will benefit from skilled PT to increase their independence and safety with mobility to allow discharge to the venue listed below.     Follow Up Recommendations Home health PT;Supervision for mobility/OOB    Equipment Recommendations  3in1 (PT)    Recommendations for Other Services OT consult     Precautions / Restrictions Precautions Precautions: Fall Precaution Comments: Direct anterior: no hip precautions Restrictions Weight Bearing Restrictions: Yes RLE Weight Bearing: Weight bearing as tolerated      Mobility  Bed Mobility Overal bed mobility: Modified Independent             General bed mobility comments: HOB elevated and use of bed rails.  Educated pt on and pt demonstrated leg hook technique.  Cues for sequencing.  Transfers Overall transfer level: Needs assistance Equipment used: Rolling walker (2 wheeled) Transfers: Sit to/from Stand Sit to Stand: Min guard         General transfer comment: Cues to push w/ at least one hand on bed.    Ambulation/Gait Ambulation/Gait assistance: Min guard Ambulation Distance (Feet): 60 Feet Assistive device: Rolling walker (2 wheeled) Gait Pattern/deviations: Step-to pattern;Step-through pattern;Antalgic;Decreased weight shift to right;Decreased stride length;Decreased stance time - right   Gait velocity interpretation: Below normal speed for age/gender General Gait Details: Pt  c/o lightheadedness after ambulating 30 ft so pt turned around and headed back to room.  Symptoms dissipated quickly upon sitting.    Stairs            Wheelchair Mobility    Modified Rankin (Stroke Patients Only)       Balance Overall balance assessment: Needs assistance Sitting-balance support: No upper extremity supported;Feet supported Sitting balance-Leahy Scale: Fair                                       Pertinent Vitals/Pain Pain Assessment: 0-10 Pain Score: 4  Pain Location: Rt hip; cramping/muscle spasms in Rt thigh Pain Descriptors / Indicators: Grimacing;Discomfort;Cramping Pain Intervention(s): Repositioned;Limited activity within patient's tolerance;Monitored during session    [place] expects to be discharged to:: Private residence Living Arrangements: Spouse/significant other Available Help at Discharge: Family;Available 24 hours/day (wife) Type of Home: House Home Access: Stairs to enter Entrance Stairs-Rails: None Entrance Stairs-Number of Steps: 2 Home Layout: Two level;Bed/bath upstairs Home Equipment: [place] - 2 wheels;Walker - standard;Crutches      Prior Function Level of Independence: Independent with assistive device(s)         Comments: RW at all times PTA     Hand Dominance   Dominant Hand: Left    Extremity/Trunk Assessment   Upper Extremity Assessment: Defer to OT evaluation           Lower Extremity Assessment: RLE deficits/detail RLE Deficits / Details: weakness and limited ROM s/p Rt THA    Cervical / Trunk  Assessment: Normal  Communication   Communication: No difficulties  Cognition Arousal/Alertness: Awake/alert Behavior During Therapy: WFL for tasks assessed/performed Overall Cognitive Status: Within Functional Limits for tasks assessed                      General Comments      Exercises Total Joint Exercises Ankle Circles/Pumps: AROM;Both;15  reps;Supine Gluteal Sets: Strengthening;Both;10 reps;Supine Straight Leg Raises: AAROM;Right;5 reps;Supine Knee Flexion: AROM;Right;5 reps;Standing (to address tightness/muscle spasms in Rt quad)      Assessment/Plan    PT Assessment Patient needs continued PT services  PT Diagnosis Difficulty walking;Abnormality of gait;Generalized weakness;Acute pain   PT Problem List Decreased strength;Decreased range of motion;Decreased activity tolerance;Decreased balance;Decreased mobility;Decreased knowledge of use of DME;Decreased knowledge of precautions;Decreased safety awareness;Decreased skin integrity;Pain  PT Treatment Interventions DME instruction;Gait training;Stair training;Functional mobility training;Therapeutic activities;Therapeutic exercise;Balance training;Neuromuscular re-education;Patient/family education;Modalities   PT Goals (Current goals can be found in the Care Plan section) Acute Rehab PT Goals Patient Stated Goal: To get stronger and be able to return to traveling PT Goal Formulation: With patient/family Time For Goal Achievement: 07/16/15 Potential to Achieve Goals: Good    Frequency 7X/week   Barriers to discharge Inaccessible home environment Full flight to get to bedroom/bathroom upstairs    Co-evaluation               End of Session Equipment Utilized During Treatment: Gait belt Activity Tolerance: Treatment limited secondary to medical complications (Comment) (lightheadedness) Patient left: in chair;with call bell/phone within reach;with family/visitor present Nurse Communication: Mobility status;Precautions;Weight bearing status         Time: 1459-1539 PT Time Calculation (min) (ACUTE ONLY): 40 min   Charges:   PT Evaluation $Initial PT Evaluation Tier I: 1 Procedure PT Treatments $Gait Training: 8-22 mins $Therapeutic Exercise: 8-22 mins   PT G Codes:       Joslyn Hy PT, DPT [phone] Pager: [phone] 07/09/2015, 3:53 PM

## 2015-07-09 NOTE — Progress Notes (Signed)
Dr massage at bedside bp 92/52 no rx ordered

## 2015-07-09 NOTE — Care Management Note (Signed)
Case Management Note  Patient Details  Name: LETCHER SCHWEIKERT MRN: [id number] Date of Birth: 18-Jun-1941  Subjective/Objective:  74 yr old male s/p right total hip arthroplasty.                  Action/Plan:  Case manager spoke with patient and his wife concerning home health and DME needs at discharge. Patient was preoperatively setup with [facility], no changes. Patient states he has a rolling walker, will need a 3in1. Patient states he has to go upstairs to reach bedroom. Case manager explained that physical therapist will work with him on steps prior to discharge.  Wife will assist at home. Case manager will continue to monitor.   Expected Discharge Date:    07/11/15              Expected Discharge Plan:  [place]  In-House Referral:  NA  Discharge planning Services  CM Consult  Post Acute Care Choice:  Home Health, Durable Medical Equipment Choice offered to:  Spouse, Patient  DME Arranged:  3-N-1, Walker rolling DME Agency:  [place]:  PT [place]:  [place]  Status of Service: Completed. Medicare Important Message Given:    Date Medicare IM Given:    Medicare IM give by:    Date Additional Medicare IM Given:    Additional Medicare Important Message give by:     If discussed at [place] of Stay Meetings, dates discussed:    Additional Comments:  Ninfa Meeker, RN 07/09/2015, 2:21 PM

## 2015-07-09 NOTE — Progress Notes (Signed)
Utilization review completed.  

## 2015-07-09 NOTE — Anesthesia Preprocedure Evaluation (Signed)
Anesthesia Evaluation  Patient identified by MRN, date of birth, ID band  History of Anesthesia Complications (+) PONV and history of anesthetic complications  Airway Mallampati: I       Dental  (+) Teeth Intact   Pulmonary former smoker,  breath sounds clear to auscultation        Cardiovascular hypertension, Rhythm:Regular Rate:Normal     Neuro/Psych    GI/Hepatic GERD-  ,  Endo/Other  diabetes  Renal/GU      Musculoskeletal  (+) Arthritis -,   Abdominal   Peds  Hematology   Anesthesia Other Findings   Reproductive/Obstetrics                             Anesthesia Physical Anesthesia Plan  ASA: II  Anesthesia Plan: Spinal   Post-op Pain Management:    Induction: Intravenous  Airway Management Planned: Natural Airway  Additional Equipment:   Intra-op Plan:   Post-operative Plan:   Informed Consent: I have reviewed the patients History and Physical, chart, labs and discussed the procedure including the risks, benefits and alternatives for the proposed anesthesia with the patient or authorized representative who has indicated his/her understanding and acceptance.     Plan Discussed with:   Anesthesia Plan Comments:         Anesthesia Quick Evaluation

## 2015-07-09 NOTE — Progress Notes (Signed)
Notified dr massage of pts bp ranging in 95,M systolic no rx ordered

## 2015-07-09 NOTE — Anesthesia Procedure Notes (Signed)
Spinal Patient location during procedure: OR Start time: 07/09/2015 7:35 AM End time: 07/09/2015 8:40 AM Staffing Anesthesiologist: Yanelli Zapanta Performed by: anesthesiologist  Preanesthetic Checklist Completed: patient identified, site marked, surgical consent, pre-op evaluation, timeout performed, IV checked, risks and benefits discussed and monitors and equipment checked Spinal Block Patient position: sitting Prep: Betadine Patient monitoring: heart rate, cardiac monitor, continuous pulse ox and blood pressure Approach: midline Location: L3-4 Needle Needle type: Pencan  Needle gauge: 24 G Needle length: 9 cm Needle insertion depth: 4 cm Assessment Sensory level: T6 Additional Notes Tolerated well

## 2015-07-09 NOTE — Interval H&P Note (Signed)
OK for surgery PD 

## 2015-07-09 NOTE — Discharge Instructions (Signed)

## 2015-07-09 NOTE — Op Note (Signed)
PRE-OP DIAGNOSIS:  RIGHT HIP DEGENERATIVE JOINT DISEASE POST-OP DIAGNOSIS:  same PROCEDURE: RIGHT TOTAL HIP ARTHROPLASTY ANTERIOR APPROACH ANESTHESIA:  Spinal SURGEON:  Melrose Nakayama MD ASSISTANT:  RNFA   INDICATIONS FOR PROCEDURE:  The patient is a 74 y.o. male with a long history of a painful hip.  This has persisted despite multiple conservative measures.  The patient has persisted with pain and dysfunction making rest and activity difficult.  A total hip replacement is offered as surgical treatment.  Informed operative consent was obtained after discussion of possible complications including reaction to anesthesia, infection, neurovascular injury, dislocation, DVT, PE, and death.  The importance of the postoperative rehab program to optimize result was stressed with the patient.  SUMMARY OF FINDINGS AND PROCEDURE:  Under general anesthesia through a anterior approach an the Hana table a right THR was performed.  The patient had severe degenerative change and excellent bone quality.  We used DePuy components to replace the hip and these were size KA 10 Corail femur capped with a +1 32 mm ceramic hip ball.  On the acetabular side we used a size 50 Gription shell with a  plus 0 neutral polyethylene liner.  We did not use a hole eliminator.  Loni Dolly PA-C assisted throughout and was invaluable to the completion of the case in that he helped position and retract while I performed the procedure.  He also closed simultaneously to help minimize OR time.  I used fluoroscopy throughout the case to check position of implants and leg lengths and read all of these views myself.  DESCRIPTION OF PROCEDURE:  The patient was taken to the OR suite where spinal anesthetic was applied.  The patient was then positioned on the Hana table supine.  All bony prominences were appropriately padded.  Prep and drape was then performed in normal sterile fashion.  The patient was given kefzol preoperative antibiotic and an  appropriate time out was performed.  We then took an anterior approach to the right hip.  Dissection was taken through adipose to the tensor fascia lata fascia.  This structure was incised longitudinally and we dissected in the intermuscular interval just medial to this muscle.  Cobra retractors were placed superior and inferior to the femoral neck superficial to the capsule.  A capsular incision was then made and the retractors were placed along the femoral neck.  Xray was brought in to get a good level for the femoral neck cut which was made with an oscillating saw and osteotome.  The femoral head was removed with a corkscrew.  The acetabulum was exposed and some labral tissues were excised. Reaming was taken to the inside wall of the pelvis and sequentially up to 1 mm smaller than the actual component.  A trial of components was done and then the aforementioned acetabular shell was placed in appropriate tilt and anteversion confirmed by fluoroscopy. The liner was placed along with the hole eliminator and attention was turned to the femur.  The leg was brought down and over into adduction and the elevator bar was used to raise the femur up gently in the wound.  The piriformis was released with care taken to preserve the obturator internus attachment and all of the posterior capsule. The femur was reamed and then broached to the appropriate size.  A trial reduction was done and the aforementioned head and neck assembly gave [place] the best stability in extension with external rotation.  Leg lengths were felt to be about equal by fluoroscopic exam.  The trial components were removed and the wound irrigated.  We then placed the femoral component in appropriate anteversion.  The head was applied to a dry stem neck and the hip again reduced.  It was again stable in the aforementioned position.  The would was irrigated again followed by re-approximation of anterior capsule with ethibond suture. Tensor fascia was repaired  with V-loc suture  followed by subcutaneous closure with #O and #2 undyed vicryl.  Skin was closed with staples followed by a sterile dressing.  EBL and IOF can be obtained from anesthesia records.  DISPOSITION:  The patient was extubated in the OR and taken to PACU in stable condition to be admitted to the Orthopedic Surgery for appropriate post-op care to include perioperative antibiotics and DVT prophylaxis.

## 2015-07-09 NOTE — Anesthesia Postprocedure Evaluation (Signed)
  Anesthesia Post-op Note  Patient: Nathan Gomez  Procedure(s) Performed: Procedure(s): TOTAL HIP ARTHROPLASTY ANTERIOR APPROACH (Right)  Patient Location: PACU  Anesthesia Type:Spinal  Level of Consciousness: awake and alert   Airway and Oxygen Therapy: Patient Spontanous Breathing  Post-op Pain: none  Post-op Assessment: Post-op Vital signs reviewed, Pain level controlled and Spinal receding     RLE Motor Response: Responds to commands RLE Sensation: Decreased (due to spinal) L Sensory Level: S1-Sole of foot, small toes R Sensory Level: S1-Sole of foot, small toes  Post-op Vital Signs: stable  Last Vitals:  Filed Vitals:   07/09/15 1015  BP: 92/53  Pulse: 58  Temp:   Resp:     Complications: No apparent anesthesia complications

## 2015-07-10 ENCOUNTER — Encounter (HOSPITAL_COMMUNITY): Payer: Self-pay | Admitting: Orthopaedic Surgery

## 2015-07-10 LAB — GLUCOSE, CAPILLARY
Glucose-Capillary: 174 mg/dL — ABNORMAL HIGH (ref 65–99)
Glucose-Capillary: 152 mg/dL — ABNORMAL HIGH (ref 65–99)
Glucose-Capillary: 204 mg/dL — ABNORMAL HIGH (ref 65–99)
Glucose-Capillary: 180 mg/dL — ABNORMAL HIGH (ref 65–99)

## 2015-07-10 LAB — CBC
HCT: 30.4 % — ABNORMAL LOW (ref 39.0–52.0)
Hemoglobin: 10.2 g/dL — ABNORMAL LOW (ref 13.0–17.0)
MCH: 31.1 pg (ref 26.0–34.0)
MCHC: 33.6 g/dL (ref 30.0–36.0)
MCV: 92.7 fL (ref 78.0–100.0)
Platelets: 150 10*3/uL (ref 150–400)
RBC: 3.28 MIL/uL — ABNORMAL LOW (ref 4.22–5.81)
RDW: 13.2 % (ref 11.5–15.5)
WBC: 7.8 10*3/uL (ref 4.0–10.5)

## 2015-07-10 LAB — BASIC METABOLIC PANEL
Anion gap: 8 (ref 5–15)
BUN: 18 mg/dL (ref 6–20)
CO2: 25 mmol/L (ref 22–32)
Calcium: 8.6 mg/dL — ABNORMAL LOW (ref 8.9–10.3)
Chloride: 101 mmol/L (ref 101–111)
Creatinine, Ser: 1.22 mg/dL (ref 0.61–1.24)
GFR calc Af Amer: >60 mL/min (ref >60)
GFR calc non Af Amer: 56 mL/min — ABNORMAL LOW (ref >60)
Glucose, Bld: 171 mg/dL — ABNORMAL HIGH (ref 65–99)
Potassium: 4.5 mmol/L (ref 3.5–5.1)
Sodium: 134 mmol/L — ABNORMAL LOW (ref 135–145)

## 2015-07-10 NOTE — Progress Notes (Signed)
Subjective: 1 Day Post-Op Procedure(s) (LRB): TOTAL HIP ARTHROPLASTY ANTERIOR APPROACH (Right)  Activity level:  OOB with PT Diet tolerance:  regular Voiding:  well Patient reports pain as mild.    Objective: Vital signs in last 24 hours: Temp:  [97.5 F (36.4 C)-98.1 F (36.7 C)] 98.1 F (36.7 C) (08/24 0532) Pulse Rate:  [57-77] 77 (08/24 0532) Resp:  [14-20] 19 (08/24 0532) BP: (85-157)/(51-75) 157/75 mmHg (08/24 0532) SpO2:  [98 %-99 %] 98 % (08/24 0532)  Labs:  Recent Labs  07/10/15 0434  HGB 10.2*    Recent Labs  07/10/15 0434  WBC 7.8  RBC 3.28*  HCT 30.4*  PLT 150    Recent Labs  07/09/15 1301 07/10/15 0434  NA 137 134*  K 4.3 4.5  CL 104 101  CO2 23 25  BUN 19 18  CREATININE 1.36* 1.22  GLUCOSE 162* 171*  CALCIUM 8.9 8.6*   No results for input(s): LABPT, INR in the last 72 hours.  Physical Exam:  Neurologically intact ABD soft Sensation intact distally Intact pulses distally Compartment soft  Assessment/Plan:  1 Day Post-Op Procedure(s) (LRB): TOTAL HIP ARTHROPLASTY ANTERIOR APPROACH (Right) Up with therapy D/C IV fluids Discharge home with home health tomorrow     Adhrit Krenz G 07/10/2015, 7:36 AM

## 2015-07-10 NOTE — Progress Notes (Signed)
Physical Therapy Treatment Patient Details Name: Nathan Gomez MRN: [id number] DOB: 01-29-1941 Today's Date: 07/10/2015    History of Present Illness Pt is a 74 y/o M s/p Rt THA.  Pt's PMH includes anemia, DM II, HTN, Lt THA.    PT Comments    Patient continues to make great progress. Able to complete steps during this session. Stated that pain in his R thigh was much better and he feels like some of the soreness if wore out. Plan is for DC home tomorrow after AM session.   Follow Up Recommendations  Home health PT;Supervision for mobility/OOB     Equipment Recommendations  3in1 (PT)    Recommendations for Other Services       Precautions / Restrictions Precautions Precautions: Fall Precaution Comments: Direct anterior: no hip precautions Restrictions Weight Bearing Restrictions: Yes RLE Weight Bearing: Weight bearing as tolerated    Mobility  Bed Mobility               General bed mobility comments: pt up in chair, returned to chair  Transfers Overall transfer level: Needs assistance Equipment used: Rolling walker (2 wheeled)   Sit to Stand: Supervision            Ambulation/Gait Ambulation/Gait assistance: Supervision Ambulation Distance (Feet): 300 Feet Assistive device: Rolling walker (2 wheeled) Gait Pattern/deviations: Step-through pattern;Decreased stride length Gait velocity: slightly decreased   General Gait Details: Safe use of RW.    Stairs Stairs: Yes Stairs assistance: Min guard Stair Management: Step to pattern;Forwards;Two rails Number of Stairs: 4 General stair comments: Cues for sequency and technique.   Wheelchair Mobility    Modified Rankin (Stroke Patients Only)       Balance                                    Cognition Arousal/Alertness: Awake/alert Behavior During Therapy: WFL for tasks assessed/performed Overall Cognitive Status: Within Functional Limits for tasks assessed                       Exercises Total Joint Exercises Quad Sets: AROM;Right;10 reps Heel Slides: AROM;Right;10 reps Hip ABduction/ADduction: AROM;Right;10 reps Long Arc Quad: AROM;Right;10 reps    General Comments        Pertinent Vitals/Pain Pain Assessment: No/denies pain Pain Score: 4  Pain Location: R thigh Pain Descriptors / Indicators: Sore Pain Intervention(s): Monitored during session    Home Living                      Prior Function            PT Goals (current goals can now be found in the care plan section) Progress towards PT goals: Progressing toward goals    Frequency  7X/week    PT Plan Current plan remains appropriate    Co-evaluation             End of Session Equipment Utilized During Treatment: Gait belt Activity Tolerance: Patient tolerated treatment well Patient left: in chair;with call bell/phone within reach     Time: 1120-1140 PT Time Calculation (min) (ACUTE ONLY): 20 min  Charges:  $Gait Training: 8-22 mins $Therapeutic Exercise: 8-22 mins                    G Codes:      Jacqualyn Posey 07/10/2015, 1:52 PM  07/10/2015 Robinette,  Tonia Brooms PTA [phone] pager [phone] office

## 2015-07-10 NOTE — Evaluation (Signed)
Occupational Therapy Evaluation Patient Details Name: Nathan Gomez MRN: [id number] DOB: September 09, 1941 Today's Date: 07/10/2015    History of Present Illness Pt is a 74 y/o M s/p Rt THA.  Pt's PMH includes anemia, DM II, HTN, Lt THA.   Clinical Impression   Pt was ambulating with crutches 1 month prior to surgery and was independent in ADL.  Educated pt in use of AE for LB ADL, multiple uses of 3 in 1, transporting items safely with RW, and safe footwear. Pt requiring min guard to supervision for mobility with RW. No further OT needs.    Follow Up Recommendations  No OT follow up    Equipment Recommendations  3 in 1 bedside comode (already delivered)    Recommendations for Other Services       Precautions / Restrictions Precautions Precautions: Fall Precaution Comments: Direct anterior: no hip precautions Restrictions RLE Weight Bearing: Weight bearing as tolerated      Mobility Bed Mobility               General bed mobility comments: pt up in chair, returned to chair  Transfers Overall transfer level: Needs assistance Equipment used: Rolling walker (2 wheeled) Transfers: Sit to/from Stand Sit to Stand: Supervision;Min guard              Balance                                            ADL Overall ADL's : Needs assistance/impaired Eating/Feeding: Independent;Sitting   Grooming: Wash/dry hands;Standing;Supervision/safety   Upper Body Bathing: Set up;Sitting   Lower Body Bathing: Minimal assistance;Sit to/from stand Lower Body Bathing Details (indicate cue type and reason): recommended long handled bath sponge Upper Body Dressing : Set up;Sitting   Lower Body Dressing: Minimal assistance;Sit to/from stand Lower Body Dressing Details (indicate cue type and reason): reviewed method for dressing with reacher Toilet Transfer: Min guard;Comfort height toilet;Ambulation;RW   Toileting- Clothing Manipulation and Hygiene:  Supervision/safety;Sit to/from stand       Functional mobility during ADLs: Min guard;Rolling walker General ADL Comments: Pt owns a reacher and long shoe horn from previous hip surgery. Instructed pt and wife in multiple uses of 3 in 1.     Vision     Perception     Praxis      Pertinent Vitals/Pain Pain Assessment: Faces Faces Pain Scale: Hurts little more Pain Location: R thigh Pain Descriptors / Indicators: Sore Pain Intervention(s): Monitored during session;Premedicated before session;Ice applied;Repositioned     Hand Dominance Left   Extremity/Trunk Assessment Upper Extremity Assessment Upper Extremity Assessment: Overall WFL for tasks assessed   Lower Extremity Assessment Lower Extremity Assessment: Defer to PT evaluation   Cervical / Trunk Assessment Cervical / Trunk Assessment: Normal (hx of back problems)   Communication Communication Communication: No difficulties   Cognition Arousal/Alertness: Awake/alert Behavior During Therapy: WFL for tasks assessed/performed Overall Cognitive Status: Within Functional Limits for tasks assessed                     General Comments       Exercises       Shoulder Instructions      Home Living Family/patient expects to be discharged to:: Private residence Living Arrangements: Spouse/significant other Available Help at Discharge: Family;Available 24 hours/day Type of Home: House Home Access: Stairs to enter [employer] of  Steps: 2 Entrance Stairs-Rails: None Home Layout: Two level;Bed/bath upstairs Alternate Level Stairs-Number of Steps: 13 Alternate Level Stairs-Rails: Right Bathroom Shower/Tub: Occupational psychologist: Standard     Home Equipment: Environmental consultant - 2 wheels;Walker - standard;Crutches          Prior Functioning/Environment Level of Independence: Independent with assistive device(s)        Comments: pt was using crutches 1 month prior to sx    OT Diagnosis:      OT Problem List:     OT Treatment/Interventions:      OT Goals(Current goals can be found in the care plan section) Acute Rehab OT Goals Patient Stated Goal: To get stronger and be able to return to traveling  OT Frequency:     Barriers to D/C:            Co-evaluation              End of Session    Activity Tolerance: Patient tolerated treatment well Patient left: in chair;with call bell/phone within reach;with chair alarm set;with family/visitor present   Time: 9169-4503 OT Time Calculation (min): 18 min Charges:  OT General Charges $OT Visit: 1 Procedure OT Evaluation $Initial OT Evaluation Tier I: 1 Procedure G-Codes:    [name] 07/10/2015, 9:13 AM  [phone]

## 2015-07-10 NOTE — Progress Notes (Signed)
Physical Therapy Treatment Patient Details Name: Nathan Gomez MRN: [id number] DOB: [birth year] Today's Date: 07/10/2015    History of Present Illness Pt is a 74 y/o M s/p Rt THA.  Pt's PMH includes anemia, DM II, HTN, Lt THA.    PT Comments    Patient progressing well. Able to increase ambulation. Will attempt steps later today  Follow Up Recommendations  Home health PT;Supervision for mobility/OOB     Equipment Recommendations  3in1 (PT)    Recommendations for Other Services       Precautions / Restrictions Precautions Precautions: Fall Precaution Comments: Direct anterior: no hip precautions Restrictions Weight Bearing Restrictions: Yes RLE Weight Bearing: Weight bearing as tolerated    Mobility  Bed Mobility               General bed mobility comments: pt up in chair, returned to chair  Transfers Overall transfer level: Needs assistance Equipment used: Rolling walker (2 wheeled) Transfers: Sit to/from Stand Sit to Stand: Min guard            Ambulation/Gait Ambulation/Gait assistance: Min guard Ambulation Distance (Feet): 300 Feet Assistive device: Rolling walker (2 wheeled) Gait Pattern/deviations: Step-through pattern Gait velocity: slightly decreased   General Gait Details: Safe use of RW.    Stairs            Wheelchair Mobility    Modified Rankin (Stroke Patients Only)       Balance                                    Cognition Arousal/Alertness: Awake/alert Behavior During Therapy: WFL for tasks assessed/performed Overall Cognitive Status: Within Functional Limits for tasks assessed                      Exercises Total Joint Exercises Quad Sets: AROM;Right;10 reps Heel Slides: AROM;Right;10 reps Hip ABduction/ADduction: AROM;Right;10 reps Long Arc Quad: AROM;Right;10 reps    General Comments        Pertinent Vitals/Pain Pain Assessment: Faces Pain Score: 4  Faces Pain Scale: Hurts  little more Pain Location: R thigh Pain Descriptors / Indicators: Sore Pain Intervention(s): Monitored during session    Home Living Family/patient expects to be discharged to:: Private residence Living Arrangements: Spouse/significant other Available Help at Discharge: Family;Available 24 hours/day Type of Home: House Home Access: Stairs to enter Entrance Stairs-Rails: None Home Layout: Two level;Bed/bath upstairs Home Equipment: Walker - 2 wheels;Walker - standard;Crutches      Prior Function Level of Independence: Independent with assistive device(s)      Comments: pt was using crutches 1 month prior to sx   PT Goals (current goals can now be found in the care plan section) Acute Rehab PT Goals Patient Stated Goal: To get stronger and be able to return to traveling Progress towards PT goals: Progressing toward goals    Frequency  7X/week    PT Plan Current plan remains appropriate    Co-evaluation             End of Session Equipment Utilized During Treatment: Gait belt Activity Tolerance: Patient tolerated treatment well Patient left: in chair;with call bell/phone within reach     Time: 0800-0826 PT Time Calculation (min) (ACUTE ONLY): 26 min  Charges:  $Gait Training: 8-22 mins $Therapeutic Exercise: 8-22 mins  G Codes:      Jacqualyn Posey 07/10/2015, 11:58 AM  07/10/2015 Jacqualyn Posey PTA [phone] pager [phone] office

## 2015-07-11 ENCOUNTER — Telehealth: Payer: Self-pay | Admitting: *Deleted

## 2015-07-11 LAB — GLUCOSE, CAPILLARY
Glucose-Capillary: 146 mg/dL — ABNORMAL HIGH (ref 65–99)
Glucose-Capillary: 184 mg/dL — ABNORMAL HIGH (ref 65–99)

## 2015-07-11 MED ORDER — HYDROCODONE-ACETAMINOPHEN 5-325 MG PO TABS: 1.0000 | ORAL_TABLET | ORAL | Status: DC | PRN

## 2015-07-11 MED ORDER — ASPIRIN 325 MG PO TBEC: 325.0000 mg | DELAYED_RELEASE_TABLET | Freq: Two times a day (BID) | ORAL | Status: DC

## 2015-07-11 MED ORDER — CYCLOBENZAPRINE HCL 5 MG PO TABS: 5.0000 mg | ORAL_TABLET | Freq: Three times a day (TID) | ORAL | Status: DC | PRN

## 2015-07-11 NOTE — Progress Notes (Signed)
07/11/15 1140 nursing Patient discharged to home per wheelchair accompanied by volunteer and wife. Discharge instructions given , explained to patient no further questions.

## 2015-07-11 NOTE — Discharge Summary (Signed)
Patient ID: Nathan Gomez MRN: [id number] DOB/AGE: 1941-10-15 74 y.o.  Admit date: 07/09/2015 Discharge date: 07/11/2015  Admission Diagnoses:  Active Problems:   Primary osteoarthritis of right hip   Discharge Diagnoses:  Same  Past Medical History  Diagnosis Date  . Hypertension   . Diabetes mellitus, type 2   . Dyslipidemia   . Osteoarthritis     right knee  . GERD (gastroesophageal reflux disease)   . Personal history of colonic adenomas 01/16/2013  . 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 Complication of anesthesia   . PONV (postoperative nausea and vomiting)   . Heart murmur     child    Surgeries: Procedure(s): TOTAL HIP ARTHROPLASTY ANTERIOR APPROACH on 07/09/2015   Consultants:    Discharged Condition: Improved  Hospital Course: Nathan Gomez is an 74 y.o. male who was admitted 07/09/2015 for operative treatment of<principal problem not specified>. Patient has severe unremitting pain that affects sleep, daily activities, and work/hobbies. After pre-op clearance the patient was taken to the operating room on 07/09/2015 and underwent  Procedure(s): TOTAL HIP ARTHROPLASTY ANTERIOR APPROACH.    Patient was given perioperative antibiotics: Anti-infectives    Start     Dose/Rate Route Frequency Ordered Stop   07/09/15 1300  ceFAZolin (ANCEF) IVPB 2 g/50 mL premix     2 g 100 mL/hr over 30 Minutes Intravenous Every 6 hours 07/09/15 1101 07/09/15 2028   07/09/15 0700  ceFAZolin (ANCEF) IVPB 2 g/50 mL premix     2 g 100 mL/hr over 30 Minutes Intravenous To [facility] 07/08/15 1235 07/09/15 0731       Patient was given sequential compression devices, early ambulation, and chemoprophylaxis to prevent DVT.  Patient benefited maximally from hospital stay and there were no complications.    Recent vital signs: Patient Vitals for the past 24 hrs:  BP Temp Pulse Resp SpO2  07/11/15 0551 (!) 124/56 mmHg 98.9 F (37.2 C) 72 18 96 %  07/10/15 2200  (!) 101/58 mmHg 99.1 F (37.3 C) 92 18 92 %  07/10/15 1300 (!) 115/56 mmHg 98.1 F (36.7 C) 72 18 97 %     Recent laboratory studies:  Recent Labs  07/09/15 1301 07/10/15 0434  WBC  --  7.8  HGB  --  10.2*  HCT  --  30.4*  PLT  --  150  NA 137 134*  K 4.3 4.5  CL 104 101  CO2 23 25  BUN 19 18  CREATININE 1.36* 1.22  GLUCOSE 162* 171*  CALCIUM 8.9 8.6*     Discharge Medications:     Medication List    STOP taking these medications        aspirin 81 MG tablet  Replaced by:  aspirin 325 MG EC tablet      TAKE these medications        amLODipine 10 MG tablet  Commonly known as:  NORVASC  Take 1 tablet (10 mg total) by mouth daily.     aspirin 325 MG EC tablet  Take 1 tablet (325 mg total) by mouth 2 (two) times daily.     atorvastatin 40 MG tablet  Commonly known as:  LIPITOR  Take 0.5 tablets (20 mg total) by mouth daily.     cyclobenzaprine 5 MG tablet  Commonly known as:  FLEXERIL  Take 1 tablet (5 mg total) by mouth 3 (three) times daily as needed for muscle spasms.     Glucosamine Sulfate Powd  by Does not apply route.     hydrochlorothiazide 25 MG tablet  Commonly known as:  HYDRODIURIL  Take 1 tablet (25 mg total) by mouth every morning.     HYDROcodone-acetaminophen 5-325 MG per tablet  Commonly known as:  NORCO/VICODIN  Take 1-2 tablets by mouth every 4 (four) hours as needed (breakthrough pain).     Krill Oil 300 MG Caps  Take 1 capsule by mouth daily.     lisinopril 20 MG tablet  Commonly known as:  PRINIVIL,ZESTRIL  Take 1 tablet (20 mg total) by mouth daily.     loratadine 10 MG tablet  Commonly known as:  CLARITIN  Take 1 tablet (10 mg total) by mouth daily.     magnesium oxide 400 MG tablet  Commonly known as:  MAG-OX  Take 1 tablet (400 mg total) by mouth 2 (two) times daily.     metFORMIN 500 MG 24 hr tablet  Commonly known as:  GLUCOPHAGE-XR  Take 2 tablets (1,000 mg total) by mouth 2 (two) times daily.     NASACORT AQ  NA  Place 1 spray into the nose daily as needed (allergies).     NIACIN FLUSH FREE 500 MG Caps  Generic drug:  Inositol Niacinate  Take 1 capsule by mouth at bedtime.     omeprazole 20 MG capsule  Commonly known as:  PRILOSEC  Take 1 capsule (20 mg total) by mouth at bedtime.     SENIOR MULTIVITAMIN PLUS Tabs  Take 1 tablet by mouth daily.     SOLUBLE FIBER/PROBIOTICS Chew  Chew 2 tablets by mouth daily.     tadalafil 20 MG tablet  Commonly known as:  CIALIS  Take 0.5-1 tablets (10-20 mg total) by mouth every other day as needed for erectile dysfunction.     Vitamin D 2000 UNITS Caps  Take 1 capsule (2,000 Units total) by mouth every morning.        Diagnostic Studies: Dg Chest 2 View  06/28/2015   CLINICAL DATA:  Hypertension.  EXAM: CHEST  2 VIEW  COMPARISON:  02/22/2012.  FINDINGS: Mediastinum and hilar structures are normal. Mild bibasilar subsegmental atelectasis and/or scarring. Minimal basilar infiltrates cannot be excluded . Similar findings noted on prior exam P No pleural effusion or pneumothorax. Degenerative changes thoracic spine .  IMPRESSION: Mild bibasilar subsegmental atelectasis and/or scarring. Minimal basilar infiltrates cannot be excluded. Similar findings noted on 02/22/2012.   Electronically Signed   By: Nathan Gomez  Register   On: 06/28/2015 12:18   Dg Hip Operative Unilat With Pelvis Right  07/09/2015   CLINICAL DATA:  Right total hip replacement.  EXAM: OPERATIVE right HIP (WITH PELVIS IF PERFORMED) 2 VIEWS  TECHNIQUE: Fluoroscopic spot image(s) were submitted for interpretation post-operatively.  FLUOROSCOPY TIME:  0 minutes 25 seconds.  COMPARISON:  08/11/2010  FINDINGS: Examination demonstrates evidence of a right total hip arthroplasty with prosthetic components normally located and intact. Left hip arthroplasty unchanged. Recommend correlation with findings at the time of the procedure.  IMPRESSION: Evidence of patient's recent right total hip arthroplasty  without complicating features.   Electronically Signed   By: Nathan Gomez M.D.   On: 07/09/2015 09:21    Disposition: 01-Home or Self Care      Discharge Instructions    Call MD / Call 911    Complete by:  As directed   If you experience chest pain or shortness of breath, CALL 911 and be transported to the hospital emergency room.  If  you develope a fever above 101 F, pus (white drainage) or increased drainage or redness at the wound, or calf pain, call your surgeon's office.     Constipation Prevention    Complete by:  As directed   Drink plenty of fluids.  Prune juice may be helpful.  You may use a stool softener, such as Colace (over the counter) 100 mg twice a day.  Use MiraLax (over the counter) for constipation as needed.     Diet - low sodium heart healthy    Complete by:  As directed      Increase activity slowly as tolerated    Complete by:  As directed   Weight as tolerated           Follow-up Information    Follow up with Hessie Dibble, MD. Schedule an appointment as soon as possible for a visit in 2 weeks.   Specialty:  Orthopedic Surgery   Contact information:   [place] [address] [phone]       Follow up with [facility].   Why:  Someone from [facility] will contact you concerning start date and time for therapy.   Contact information:   [address] [place] [address] [phone]        Signed: Dione Housekeeper 07/11/2015, 10:24 AM

## 2015-07-11 NOTE — Progress Notes (Signed)
Physical Therapy Treatment Patient Details Name: FARMER MCCAHILL MRN: [id number] DOB: 1941/01/04 Today's Date: 07/11/2015    History of Present Illness Pt is a 74 y/o M s/p Rt THA.  Pt's PMH includes anemia, DM II, HTN, Lt THA.    PT Comments    Patient progressing well and able to complete steps yesterday and today. No concerns with patient going up a flight of steps at home. Patient safe to D/C from a mobility standpoint based on progression towards goals set on PT eval.    Follow Up Recommendations  Home health PT;Supervision for mobility/OOB     Equipment Recommendations  3in1 (PT)    Recommendations for Other Services       Precautions / Restrictions Precautions Precautions: Fall Precaution Comments: Direct anterior: no hip precautions Restrictions RLE Weight Bearing: Weight bearing as tolerated    Mobility  Bed Mobility                  Transfers Overall transfer level: Modified independent                  Ambulation/Gait Ambulation/Gait assistance: Modified independent (Device/Increase time) Ambulation Distance (Feet): 600 Feet Assistive device: Rolling walker (2 wheeled) Gait Pattern/deviations: Step-through pattern;Decreased stride length     General Gait Details: Safe use of RW.    Stairs   Stairs assistance: Supervision Stair Management: Two rails;Forwards;Step to pattern Number of Stairs: 2 General stair comments: Patient with safe technique  Wheelchair Mobility    Modified Rankin (Stroke Patients Only)       Balance                                    Cognition Arousal/Alertness: Awake/alert Behavior During Therapy: WFL for tasks assessed/performed Overall Cognitive Status: Within Functional Limits for tasks assessed                      Exercises Total Joint Exercises Hip ABduction/ADduction: AROM;Right;10 reps;Standing Knee Flexion: AROM;Right;Standing;10 reps Marching in Standing:  AROM;Right;10 reps;Standing Other Exercises Other Exercises: Minisquats x10 holding onto RW    General Comments        Pertinent Vitals/Pain Pain Score: 7  Pain Location: R thigh Pain Descriptors / Indicators: Sore Pain Intervention(s): Monitored during session;Repositioned    Home Living                      Prior Function            PT Goals (current goals can now be found in the care plan section) Progress towards PT goals: Progressing toward goals    Frequency  7X/week    PT Plan Current plan remains appropriate    Co-evaluation             End of Session   Activity Tolerance: Patient tolerated treatment well Patient left: in chair;with call bell/phone within reach;with family/visitor present     Time: 0911-0927 PT Time Calculation (min) (ACUTE ONLY): 16 min  Charges:  $Gait Training: 8-22 mins                    G Codes:      Jacqualyn Posey 07/11/2015, 9:32 AM 07/11/2015 Jacqualyn Posey PTA [phone] pager [phone] office

## 2015-07-11 NOTE — Telephone Encounter (Signed)
Pt was on tcm list d/c 8/25 had (R) Hip surgery he will be following up with surgeon Dr. Rhona Raider in 2 wks...Nathan Gomez

## 2015-07-12 DIAGNOSIS — I10 Essential (primary) hypertension: Secondary | ICD-10-CM | POA: Diagnosis not present

## 2015-07-12 DIAGNOSIS — M1711 Unilateral primary osteoarthritis, right knee: Secondary | ICD-10-CM | POA: Diagnosis not present

## 2015-07-12 DIAGNOSIS — Z96643 Presence of artificial hip joint, bilateral: Secondary | ICD-10-CM | POA: Diagnosis not present

## 2015-07-12 DIAGNOSIS — Z471 Aftercare following joint replacement surgery: Secondary | ICD-10-CM | POA: Diagnosis not present

## 2015-07-12 DIAGNOSIS — Z87891 Personal history of nicotine dependence: Secondary | ICD-10-CM | POA: Diagnosis not present

## 2015-07-12 DIAGNOSIS — E119 Type 2 diabetes mellitus without complications: Secondary | ICD-10-CM | POA: Diagnosis not present

## 2015-07-16 ENCOUNTER — Emergency Department (INDEPENDENT_AMBULATORY_CARE_PROVIDER_SITE_OTHER)
Admission: EM | Admit: 2015-07-16 | Discharge: 2015-07-16 | Disposition: A | Discharge status: Home / Self Care | Payer: Medicare Other | Source: Home / Self Care | Attending: Emergency Medicine | Admitting: Emergency Medicine

## 2015-07-16 ENCOUNTER — Encounter (HOSPITAL_COMMUNITY): Payer: Self-pay | Admitting: Emergency Medicine

## 2015-07-16 DIAGNOSIS — R300 Dysuria: Secondary | ICD-10-CM

## 2015-07-16 DIAGNOSIS — N9989 Other postprocedural complications and disorders of genitourinary system: Secondary | ICD-10-CM

## 2015-07-16 DIAGNOSIS — R39198 Other difficulties with micturition: Secondary | ICD-10-CM

## 2015-07-16 LAB — POCT URINALYSIS DIP (DEVICE)
Bilirubin Urine: NEGATIVE
Glucose, UA: NEGATIVE mg/dL
Hgb urine dipstick: NEGATIVE
Ketones, ur: NEGATIVE mg/dL
Leukocytes, UA: NEGATIVE
Nitrite: NEGATIVE
Protein, ur: NEGATIVE mg/dL
Specific Gravity, Urine: 1.015 (ref 1.005–1.030)
Urobilinogen, UA: 0.2 mg/dL (ref 0.0–1.0)
pH: 7 (ref 5.0–8.0)

## 2015-07-16 MED ORDER — TAMSULOSIN HCL 0.4 MG PO CAPS: 0.4000 mg | ORAL_CAPSULE | Freq: Every day | ORAL | Status: DC

## 2015-07-16 NOTE — Discharge Instructions (Signed)
Dysuria For additional problems such as fever, increased pain, total inability to urinate, bladder fullness and enlargement or other abnormal symptoms sick medical attention promptly. Take Flonase daily as directed. Dysuria is the medical term for pain with urination. There are many causes for dysuria, but urinary tract infection is the most common. If a urinalysis was performed it can show that there is a urinary tract infection. A urine culture confirms that you or your child is sick. You will need to follow up with a healthcare provider because:  If a urine culture was done you will need to know the culture results and treatment recommendations.  If the urine culture was positive, you or your child will need to be put on antibiotics or know if the antibiotics prescribed are the right antibiotics for your urinary tract infection.  If the urine culture is negative (no urinary tract infection), then other causes may need to be explored or antibiotics need to be stopped. Today laboratory work may have been done and there does not seem to be an infection. If cultures were done they will take at least 24 to 48 hours to be completed. Today x-rays may have been taken and they read as normal. No cause can be found for the problems. The x-rays may be re-read by a radiologist and you will be contacted if additional findings are made. You or your child may have been put on medications to help with this problem until you can see your primary caregiver. If the problems get better, see your primary caregiver if the problems return. If you were given antibiotics (medications which kill germs), take all of the mediations as directed for the full course of treatment.  If laboratory work was done, you need to find the results. Leave a telephone number where you can be reached. If this is not possible, make sure you find out how you are to get test results. HOME CARE INSTRUCTIONS   Drink lots of fluids. For adults,  drink eight, 8 ounce glasses of clear juice or water a day. For children, replace fluids as suggested by your caregiver.  Empty the bladder often. Avoid holding urine for long periods of time.  After a bowel movement, women should cleanse front to back, using each tissue only once.  Empty your bladder before and after sexual intercourse.  Take all the medicine given to you until it is gone. You may feel better in a few days, but TAKE ALL MEDICINE.  Avoid caffeine, tea, alcohol and carbonated beverages, because they tend to irritate the bladder.  In men, alcohol may irritate the prostate.  Only take over-the-counter or prescription medicines for pain, discomfort, or fever as directed by your caregiver.  If your caregiver has given you a follow-up appointment, it is very important to keep that appointment. Not keeping the appointment could result in a chronic or permanent injury, pain, and disability. If there is any problem keeping the appointment, you must call back to this facility for assistance. SEEK IMMEDIATE MEDICAL CARE IF:   Back pain develops.  A fever develops.  There is nausea (feeling sick to your stomach) or vomiting (throwing up).  Problems are no better with medications or are getting worse. MAKE SURE YOU:   Understand these instructions.  Will watch your condition.  Will get help right away if you are not doing well or get worse. Document Released: 07/31/2004 Document Revised: 01/25/2012 Document Reviewed: 06/07/2008 [facility] Patient Information 2015 [place], [place]. This information is not  intended to replace advice given to you by your health care provider. Make sure you discuss any questions you have with your health care provider.  Neurogenic Bladder Neurogenic bladder is a loss of normal control of bladder function. This is caused by damaged nerves, which can be a result of a variety of injuries and diseases.  The muscles and nerves of the urinary system work  together to store urine and release urine at the right time. Nerves carry messages from the bladder to the brain and from the brain to the muscles of the bladder. In a neurogenic bladder, the nerves that are supposed to carry these messages do not work properly.There are 2 types of neurogenic bladder:  Overactive.  The bladder is unable to control when or how much to urinate. Even when only a small amount of urine is in the bladder, there may be an urge to urinate that cannot be controlled. This can result in wetting accidents (urge incontinence).  Underactive. The bladder holds much more urine than normal. Small amounts of urine leak out as bladder pressure builds because there is not a sensation that the bladder is full. This can also result in wetting accidents. CAUSES  Neurogenic bladder can be caused by many problems. These include:   Stroke.  Multiple sclerosis.  Infection.  Trauma and injuries to the spine, spinal cord, depending on the level in the back of the injury.  Diabetes.  Parkinson's Disease.  Brain and spinal cord tumors.  Birth defects that affect the brain or spinal cord.  Guillain-Barr syndrome.  Complications of surgery involving the spine, spinal cord, or pelvis. SYMPTOMS  The following are the most common problems and symptoms of neurogenic bladder. However, each individual may experience symptoms differently.   Urinary tract infection symptoms:  Pain or burning when urinating.  Back or flank pain.  Cloudy or bloody urine.  Fever.  Kidney stone symptoms. Symptoms of kidney stones include:  Chills.  Shivering.  Feeling sick to your stomach (nausea) and/or vomiting.  Fever.  Loss of bladder control (urinary incontinence).  Small urine volume when emptying bladder (voiding).  Urinary frequency and urgency.  Dribbling urine.  Loss of sensation of bladder fullness.  Kidney failure.  Infection in the blood stream (sepsis). DIAGNOSIS    When neurogenic bladder is suspected, both the nervous system and the bladder are examined. In addition to reviewing your complete medical history and having a physical exam, diagnostic procedures for neurogenic bladder may include:  X-rays of the spine.  Imaging tests of the kidneys, ureters, and bladder. These tests may include:  MRI.  CT scan.  Ultrasound.  Urodynamics or Cystometrogram (CMG). Thistests the nerves and muscles of the bladder. The test can show how much the bladder can hold and if it empties completely.  EMG of the sphincter . This is measure of the electrical activity of the sphincter muscle and this helps determine when the sphincter is contracting or squeezing down.  Video studies of the bladder and sphincter while you are urinating.  Cystoscopy . Looking inside the bladder with a telescope like instrument. TREATMENT  Specific treatment for neurogenic bladder will be determined by your caregiver based on:  Your age, overall health, and medical history.  Severity of symptoms.  Cause of the nerve damage.  Type of bladder problem present.  Your tolerance for specific medications, procedures, or therapies.  Expectations for the course of the condition.  Your opinion or preference. Treatment may include:  Antibiotic medicine.  Medications.  Insertion of a flexible tube (catheter) to empty the bladder.  Surgery to create an artificial sphincterto prevent urinary leakage.  Sacral nerve stimulation (SNS) to help stimulate the nerves in order to empty the bladder.  Sling surgery to hold the neck of the bladder and urethra in the proper position to prevent leakage.  Bladder augmentation.  Ileal loop surgery to connect a section of intestine to the ureters and positioned out to a opening on the abdomen. Urine comes out and can be collected into a plastic bag.  Perineal pads may be used to catch urine. HOME CARE INSTRUCTIONS   Take all medications as  prescribed by your caregiver.  Review your medications (both prescription and non-prescription) with your caregiver to make sure medications you are presently taking will not be harmful.  Periodic blood, urine, and imaging tests may be required. Follow your caregiver's advice regarding the timing of these.  Follow the catheter care instructions if you use a catheter.  Use disposiable underwear if you have bladder weakness. SEEK MEDICAL CARE IF:   You have increasing fatigue or weakness.  You find there is less and less control of the urine stream despite treatment.  You develop a loss of appetite or nausea.  You begin to have trouble inserting the catheter.  Your urine appears somewhat dark, cloudy, or has an unusual smell. SEEK IMMEDIATE MEDICAL CARE IF:   You develop worsening abdominal or back pain.  You cannot pass any urine.  Your urine becomes bloody.  You experience a lot of burning while urinating.  You have a fever.  You keep vomiting.  You have bleeding with catheter insertion. Document Released: 05/16/2007 Document Revised: 10/19/2012 Document Reviewed: 02/13/2014 [facility] Patient Information 2015 [place], [place]. This information is not intended to replace advice given to you by your health care provider. Make sure you discuss any questions you have with your health care provider.

## 2015-07-16 NOTE — ED Notes (Signed)
Surgical team was walking by the Urgent Care when they received a text message that this pt was here, so they came to see how he was doing.

## 2015-07-16 NOTE — ED Provider Notes (Signed)
CSN: [id number]     Arrival date & time 07/16/15  1308 History   First MD Initiated Contact with Patient 07/16/15 1402     Chief Complaint  Patient presents with  . Urinary Retention   (Consider location/radiation/quality/duration/timing/severity/associated sxs/prior Treatment) HPI Comments: 82 sure old male underwent right knee replacement on 07/09/2015. He states he has been recovering well since his surgery. He is complaining of urinary retention. Specifically he has dysuria and urinary frequency in which he urinates a small amount with each void. He has to void approximately every 40-50 minutes and is up several times during the night empty his bladder. He denies suprapubic pain.   Past Medical History  Diagnosis Date  . Hypertension   . Diabetes mellitus, type 2   . Dyslipidemia   . Osteoarthritis     right knee  . GERD (gastroesophageal reflux disease)   . Personal history of colonic adenomas 01/16/2013  . 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 Complication of anesthesia   . PONV (postoperative nausea and vomiting)   . Heart murmur     child   Past Surgical History  Procedure Laterality Date  . Tonsillectomy  1945  . Total hip arthroplasty  2011    Left  . Cataract extraction  08/2012 L, 09/2012 R  . Colonoscopy    . Tonsillectomy    . Total hip arthroplasty Right 07/09/2015    Procedure: TOTAL HIP ARTHROPLASTY ANTERIOR APPROACH;  Surgeon: Melrose Nakayama, MD;  Location: [facility];  Service: Orthopedics;  Laterality: Right;   Family History  Problem Relation Age of Onset  . Ovarian cancer Mother   . Hypertension Father   . Diabetes Maternal Grandfather   . Pneumonia Paternal Grandfather    Social History  Substance Use Topics  . Smoking status: Former Smoker -- 1.00 packs/day for 20 years    Quit date: 11/16/1986  . Smokeless tobacco: Never Used  . Alcohol Use: 1.8 oz/week    3 Glasses of wine per week    Review of Systems  Constitutional: Negative for  fever.  Respiratory: Negative.   Cardiovascular: Negative for chest pain.  Gastrointestinal: Negative.   Genitourinary: Positive for dysuria, frequency, decreased urine volume and difficulty urinating. Negative for hematuria, discharge, penile swelling, scrotal swelling, genital sores, penile pain and testicular pain.  Neurological: Negative.   Psychiatric/Behavioral: Negative.     Allergies  Sulfa antibiotics and Thiazide-type diuretics  Home Medications   Prior to Admission medications   Medication Sig Start Date End Date Taking? Authorizing Provider  amLODipine (NORVASC) 10 MG tablet Take 1 tablet (10 mg total) by mouth daily. 03/18/15  Yes Rowe Clack, MD  atorvastatin (LIPITOR) 40 MG tablet Take 0.5 tablets (20 mg total) by mouth daily. 01/29/15  Yes Rowe Clack, MD  cyclobenzaprine (FLEXERIL) 5 MG tablet Take 1 tablet (5 mg total) by mouth 3 (three) times daily as needed for muscle spasms. 07/11/15  Yes Roselee Nova, PA-C  hydrochlorothiazide (HYDRODIURIL) 25 MG tablet Take 1 tablet (25 mg total) by mouth every morning. 03/18/15  Yes Rowe Clack, MD  HYDROcodone-acetaminophen (NORCO/VICODIN) 5-325 MG per tablet Take 1-2 tablets by mouth every 4 (four) hours as needed (breakthrough pain). 07/11/15  Yes Roselee Nova, PA-C  lisinopril (PRINIVIL,ZESTRIL) 20 MG tablet Take 1 tablet (20 mg total) by mouth daily. 07/31/14  Yes Rowe Clack, MD  loratadine (CLARITIN) 10 MG tablet Take 1 tablet (10 mg total) by mouth daily. 03/18/15 12/20/16 Yes Mateo Flow  A Leschber, MD  metFORMIN (GLUCOPHAGE-XR) 500 MG 24 hr tablet Take 2 tablets (1,000 mg total) by mouth 2 (two) times daily. 01/29/15  Yes Rowe Clack, MD  omeprazole (PRILOSEC) 20 MG capsule Take 1 capsule (20 mg total) by mouth at bedtime. 01/29/15  Yes Rowe Clack, MD  aspirin EC 325 MG EC tablet Take 1 tablet (325 mg total) by mouth 2 (two) times daily. 07/11/15   Roselee Nova, PA-C  Cholecalciferol  (VITAMIN D) 2000 UNITS CAPS Take 1 capsule (2,000 Units total) by mouth every morning. 01/26/14   Rowe Clack, MD  Glucosamine Sulfate POWD by Does not apply route.      Historical Provider, MD  Inositol Niacinate (NIACIN FLUSH FREE) 500 MG CAPS Take 1 capsule by mouth at bedtime.      Historical Provider, MD  Javier Docker Oil 300 MG CAPS Take 1 capsule by mouth daily.    Historical Provider, MD  magnesium oxide (MAG-OX) 400 MG tablet Take 1 tablet (400 mg total) by mouth 2 (two) times daily. 01/26/14   Rowe Clack, MD  Multiple Vitamins-Minerals (SENIOR MULTIVITAMIN PLUS) TABS Take 1 tablet by mouth daily.      Historical Provider, MD  Probiotic Product (SOLUBLE FIBER/PROBIOTICS) CHEW Chew 2 tablets by mouth daily. 01/29/15   Rowe Clack, MD  tamsulosin (FLOMAX) 0.4 MG CAPS capsule Take 1 capsule (0.4 mg total) by mouth at bedtime. 07/16/15   Janne Napoleon, NP  Triamcinolone Acetonide (NASACORT AQ NA) Place 1 spray into the nose daily as needed (allergies).    Historical Provider, MD   Meds Ordered and Administered this Visit  Medications - No data to display  BP 129/70 mmHg  Pulse 98  Temp(Src) 98.6 F (37 C) (Oral)  Resp 15  SpO2 94% No data found.   Physical Exam  Constitutional: He is oriented to person, place, and time. He appears well-developed and well-nourished. No distress.  Neck: Normal range of motion. Neck supple.  Cardiovascular: Normal rate.   Pulmonary/Chest: Effort normal. No respiratory distress.  Abdominal: Soft. Bowel sounds are normal. He exhibits no distension and no mass. There is no tenderness. There is no rebound and no guarding.  There is no palpable or observable evidence of bladder enlargement. No associated suprapubic tenderness.  Neurological: He is alert and oriented to person, place, and time. He exhibits normal muscle tone.  Skin: Skin is warm and dry.  Psychiatric: He has a normal mood and affect.  Nursing note and vitals reviewed.   ED  Course  Procedures (including critical care time)  Labs Review Labs Reviewed  POCT URINALYSIS DIP (DEVICE)    Imaging Review No results found.  Results for orders placed or performed during the hospital encounter of 07/16/15  POCT urinalysis dip (device)  Result Value Ref Range   Glucose, UA NEGATIVE NEGATIVE mg/dL   Bilirubin Urine NEGATIVE NEGATIVE   Ketones, ur NEGATIVE NEGATIVE mg/dL   Specific Gravity, Urine 1.015 1.005 - 1.030   Hgb urine dipstick NEGATIVE NEGATIVE   pH 7.0 5.0 - 8.0   Protein, ur NEGATIVE NEGATIVE mg/dL   Urobilinogen, UA 0.2 0.0 - 1.0 mg/dL   Nitrite NEGATIVE NEGATIVE   Leukocytes, UA NEGATIVE NEGATIVE    Visual Acuity Review  Right Eye Distance:   Left Eye Distance:   Bilateral Distance:    Right Eye Near:   Left Eye Near:    Bilateral Near:         MDM   1.  Postoperative voiding difficulty   2. Dysuria    Probable postoperative neurogenic bladder or sphincter abnormality. We will treat with Flomax. For additional problems such as fever, increased pain, total inability to urinate, bladder fullness and enlargement or other abnormal symptoms sick medical attention promptly. Take Flomax daily as directed.    Janne Napoleon, NP 07/16/15 1539

## 2015-07-16 NOTE — ED Notes (Signed)
Pt had hip replacement surgery on 8/23.  Unclear if he was cathed during the surgery.  Pt has had increasingly more urinary retention since the surgery.  Pt states he really started noticing it to be a problem on Sunday.  He uses the bathroom every hour with very little urine.  He reports 7/10 burning with urination.  They deny any fevers.  Pt does have some right leg edema from the surgery.

## 2015-07-17 ENCOUNTER — Emergency Department (HOSPITAL_COMMUNITY)
Admission: EM | Admit: 2015-07-17 | Discharge: 2015-07-17 | Disposition: A | Discharge status: Home / Self Care | Payer: Medicare Other | Attending: Emergency Medicine | Admitting: Emergency Medicine

## 2015-07-17 ENCOUNTER — Encounter (HOSPITAL_COMMUNITY): Payer: Self-pay | Admitting: Family Medicine

## 2015-07-17 DIAGNOSIS — E119 Type 2 diabetes mellitus without complications: Secondary | ICD-10-CM | POA: Diagnosis not present

## 2015-07-17 DIAGNOSIS — Z87891 Personal history of nicotine dependence: Secondary | ICD-10-CM | POA: Insufficient documentation

## 2015-07-17 DIAGNOSIS — R42 Dizziness and giddiness: Secondary | ICD-10-CM | POA: Insufficient documentation

## 2015-07-17 DIAGNOSIS — M7989 Other specified soft tissue disorders: Secondary | ICD-10-CM | POA: Insufficient documentation

## 2015-07-17 DIAGNOSIS — M1990 Unspecified osteoarthritis, unspecified site: Secondary | ICD-10-CM | POA: Diagnosis not present

## 2015-07-17 DIAGNOSIS — Z86018 Personal history of other benign neoplasm: Secondary | ICD-10-CM | POA: Insufficient documentation

## 2015-07-17 DIAGNOSIS — K219 Gastro-esophageal reflux disease without esophagitis: Secondary | ICD-10-CM | POA: Insufficient documentation

## 2015-07-17 DIAGNOSIS — Z87828 Personal history of other (healed) physical injury and trauma: Secondary | ICD-10-CM | POA: Insufficient documentation

## 2015-07-17 DIAGNOSIS — I10 Essential (primary) hypertension: Secondary | ICD-10-CM | POA: Insufficient documentation

## 2015-07-17 DIAGNOSIS — Z79899 Other long term (current) drug therapy: Secondary | ICD-10-CM | POA: Insufficient documentation

## 2015-07-17 DIAGNOSIS — R339 Retention of urine, unspecified: Secondary | ICD-10-CM

## 2015-07-17 DIAGNOSIS — R011 Cardiac murmur, unspecified: Secondary | ICD-10-CM | POA: Insufficient documentation

## 2015-07-17 DIAGNOSIS — E785 Hyperlipidemia, unspecified: Secondary | ICD-10-CM | POA: Diagnosis not present

## 2015-07-17 DIAGNOSIS — R251 Tremor, unspecified: Secondary | ICD-10-CM | POA: Diagnosis present

## 2015-07-17 DIAGNOSIS — N289 Disorder of kidney and ureter, unspecified: Secondary | ICD-10-CM | POA: Diagnosis not present

## 2015-07-17 LAB — CBC
HCT: 30.7 % — ABNORMAL LOW (ref 39.0–52.0)
Hemoglobin: 10.1 g/dL — ABNORMAL LOW (ref 13.0–17.0)
MCH: 30.8 pg (ref 26.0–34.0)
MCHC: 32.9 g/dL (ref 30.0–36.0)
MCV: 93.6 fL (ref 78.0–100.0)
Platelets: 350 10*3/uL (ref 150–400)
RBC: 3.28 MIL/uL — ABNORMAL LOW (ref 4.22–5.81)
RDW: 12.9 % (ref 11.5–15.5)
WBC: 7.2 10*3/uL (ref 4.0–10.5)

## 2015-07-17 LAB — URINALYSIS, ROUTINE W REFLEX MICROSCOPIC
Bilirubin Urine: NEGATIVE
Glucose, UA: NEGATIVE mg/dL
Hgb urine dipstick: NEGATIVE
Ketones, ur: NEGATIVE mg/dL
Leukocytes, UA: NEGATIVE
Nitrite: NEGATIVE
Protein, ur: NEGATIVE mg/dL
Specific Gravity, Urine: 1.013 (ref 1.005–1.030)
Urobilinogen, UA: 0.2 mg/dL (ref 0.0–1.0)
pH: 6 (ref 5.0–8.0)

## 2015-07-17 LAB — CBG MONITORING, ED: Glucose-Capillary: 224 mg/dL — ABNORMAL HIGH (ref 65–99)

## 2015-07-17 LAB — BASIC METABOLIC PANEL
Anion gap: 11 (ref 5–15)
BUN: 17 mg/dL (ref 6–20)
CO2: 23 mmol/L (ref 22–32)
Calcium: 9.6 mg/dL (ref 8.9–10.3)
Chloride: 101 mmol/L (ref 101–111)
Creatinine, Ser: 1.61 mg/dL — ABNORMAL HIGH (ref 0.61–1.24)
GFR calc Af Amer: 46 mL/min — ABNORMAL LOW (ref >60)
GFR calc non Af Amer: 40 mL/min — ABNORMAL LOW (ref >60)
Glucose, Bld: 238 mg/dL — ABNORMAL HIGH (ref 65–99)
Potassium: 4.9 mmol/L (ref 3.5–5.1)
Sodium: 135 mmol/L (ref 135–145)

## 2015-07-17 LAB — I-STAT TROPONIN, ED: Troponin i, poc: 0 ng/mL (ref 0.00–0.08)

## 2015-07-17 MED ORDER — SODIUM CHLORIDE 0.9 % IV BOLUS (SEPSIS)
1000.0000 mL | Freq: Once | INTRAVENOUS | Status: AC
  Administered 2015-07-17: 1000 mL via INTRAVENOUS

## 2015-07-17 NOTE — ED Notes (Signed)
IV attempt x 2 unsuccessful.

## 2015-07-17 NOTE — Discharge Instructions (Signed)
See urology this week.   Keep foley in.  See your orthopedic doctor for follow up.  Repeat kidney function test in a week.   Return to ER if foley not draining, severe abdominal pain, blood in urine, fever.

## 2015-07-17 NOTE — ED Notes (Signed)
Pt here sts he doesn't feel well. sts he has been shaking and this am felt lightheaded. . sts he drank some OJ. Pt unable to check CBG due to broke meter. Denies chest pain, SOB.. Denies N,V.

## 2015-07-17 NOTE — ED Provider Notes (Signed)
CSN: [id number]     Arrival date & time 07/17/15  1254 History   First MD Initiated Contact with Patient 07/17/15 1506     Chief Complaint  Patient presents with  . Shaking     (Consider location/radiation/quality/duration/timing/severity/associated sxs/prior Treatment) The history is provided by the patient.  Nathan Gomez is a 74 y.o. male hx of HTN, DM, GERD, recent R hip replacement here with lightheadedness, dizziness. Patient had right hip replacement done on 8/25. Patient is on full dose ASA. Yesterday, went to urgent care for difficulty urination and had nl UA and was given flomax. Has been urinating better. Today, felt light headed and dizzy. Denies chest pain or shortness of breath. Has chills, no fevers. No vomiting.    Past Medical History  Diagnosis Date  . Hypertension   . Diabetes mellitus, type 2   . Dyslipidemia   . Osteoarthritis     right knee  . GERD (gastroesophageal reflux disease)   . Personal history of colonic adenomas 01/16/2013  . 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 Complication of anesthesia   . PONV (postoperative nausea and vomiting)   . Heart murmur     child   Past Surgical History  Procedure Laterality Date  . Tonsillectomy  1945  . Total hip arthroplasty  2011    Left  . Cataract extraction  08/2012 L, 09/2012 R  . Colonoscopy    . Tonsillectomy    . Total hip arthroplasty Right 07/09/2015    Procedure: TOTAL HIP ARTHROPLASTY ANTERIOR APPROACH;  Surgeon: Melrose Nakayama, MD;  Location: [facility];  Service: Orthopedics;  Laterality: Right;   Family History  Problem Relation Age of Onset  . Ovarian cancer Mother   . Hypertension Father   . Diabetes Maternal Grandfather   . Pneumonia Paternal Grandfather    Social History  Substance Use Topics  . Smoking status: Former Smoker -- 1.00 packs/day for 20 years    Quit date: 11/16/1986  . Smokeless tobacco: Never Used  . Alcohol Use: 1.8 oz/week    3 Glasses of wine per week     Review of Systems  Neurological: Positive for dizziness and light-headedness.  All other systems reviewed and are negative.     Allergies  Sulfa antibiotics and Thiazide-type diuretics  Home Medications   Prior to Admission medications   Medication Sig Start Date End Date Taking? Authorizing Provider  amLODipine (NORVASC) 10 MG tablet Take 1 tablet (10 mg total) by mouth daily. 03/18/15  Yes Rowe Clack, MD  aspirin EC 325 MG EC tablet Take 1 tablet (325 mg total) by mouth 2 (two) times daily. 07/11/15  Yes Roselee Nova, PA-C  atorvastatin (LIPITOR) 40 MG tablet Take 0.5 tablets (20 mg total) by mouth daily. 01/29/15  Yes Rowe Clack, MD  Cholecalciferol (VITAMIN D) 2000 UNITS CAPS Take 1 capsule (2,000 Units total) by mouth every morning. 01/26/14  Yes Rowe Clack, MD  cyclobenzaprine (FLEXERIL) 5 MG tablet Take 1 tablet (5 mg total) by mouth 3 (three) times daily as needed for muscle spasms. 07/11/15  Yes Roselee Nova, PA-C  Glucosamine Sulfate POWD by Does not apply route.     Yes Historical Provider, MD  hydrochlorothiazide (HYDRODIURIL) 25 MG tablet Take 1 tablet (25 mg total) by mouth every morning. 03/18/15  Yes Rowe Clack, MD  HYDROcodone-acetaminophen (NORCO/VICODIN) 5-325 MG per tablet Take 1-2 tablets by mouth every 4 (four) hours as needed (breakthrough pain). 07/11/15  Yes  Roselee Nova, PA-C  Inositol Niacinate (NIACIN FLUSH FREE) 500 MG CAPS Take 1 capsule by mouth at bedtime.     Yes Historical Provider, MD  Javier Docker Oil 300 MG CAPS Take 1 capsule by mouth daily.   Yes Historical Provider, MD  lisinopril (PRINIVIL,ZESTRIL) 20 MG tablet Take 1 tablet (20 mg total) by mouth daily. 07/31/14  Yes Rowe Clack, MD  loratadine (CLARITIN) 10 MG tablet Take 1 tablet (10 mg total) by mouth daily. 03/18/15 12/20/16 Yes Rowe Clack, MD  magnesium oxide (MAG-OX) 400 MG tablet Take 1 tablet (400 mg total) by mouth 2 (two) times daily. 01/26/14   Yes Rowe Clack, MD  metFORMIN (GLUCOPHAGE-XR) 500 MG 24 hr tablet Take 2 tablets (1,000 mg total) by mouth 2 (two) times daily. 01/29/15  Yes Rowe Clack, MD  Multiple Vitamins-Minerals (SENIOR MULTIVITAMIN PLUS) TABS Take 1 tablet by mouth daily.     Yes Historical Provider, MD  omeprazole (PRILOSEC) 20 MG capsule Take 1 capsule (20 mg total) by mouth at bedtime. 01/29/15  Yes Rowe Clack, MD  Probiotic Product (SOLUBLE FIBER/PROBIOTICS) CHEW Chew 2 tablets by mouth daily. 01/29/15  Yes Rowe Clack, MD  tamsulosin (FLOMAX) 0.4 MG CAPS capsule Take 1 capsule (0.4 mg total) by mouth at bedtime. 07/16/15  Yes Janne Napoleon, NP  Triamcinolone Acetonide (NASACORT AQ NA) Place 1 spray into the nose daily as needed (allergies).   Yes Historical Provider, MD   BP 130/69 mmHg  Pulse 82  Temp(Src) 97.3 F (36.3 C) (Oral)  Resp 15  SpO2 93% Physical Exam  Constitutional: He is oriented to person, place, and time. He appears well-developed and well-nourished.  HENT:  Head: Normocephalic.  MM slightly dry   Eyes: Conjunctivae are normal. Pupils are equal, round, and reactive to light.  Neck: Normal range of motion. Neck supple.  Cardiovascular: Normal rate, regular rhythm and normal heart sounds.   Pulmonary/Chest: Effort normal and breath sounds normal. No respiratory distress. He has no wheezes. He has no rales.  Abdominal: Soft. Bowel sounds are normal. He exhibits no distension. There is no tenderness. There is no rebound.  Musculoskeletal:  R thigh swelling, no calf tenderness. Surgical site healing well.   Neurological: He is alert and oriented to person, place, and time.  Skin: Skin is warm and dry.  Psychiatric: He has a normal mood and affect. His behavior is normal. Judgment and thought content normal.  Nursing note and vitals reviewed.   ED Course  Procedures (including critical care time) Labs Review Labs Reviewed  BASIC METABOLIC PANEL - Abnormal; Notable  for the following:    Glucose, Bld 238 (*)    Creatinine, Ser 1.61 (*)    GFR calc non Af Amer 40 (*)    GFR calc Af Amer 46 (*)    All other components within normal limits  CBC - Abnormal; Notable for the following:    RBC 3.28 (*)    Hemoglobin 10.1 (*)    HCT 30.7 (*)    All other components within normal limits  CBG MONITORING, ED - Abnormal; Notable for the following:    Glucose-Capillary 224 (*)    All other components within normal limits  URINALYSIS, ROUTINE W REFLEX MICROSCOPIC (NOT AT [facility])  I-STAT TROPOININ, ED    Imaging Review No results found. I have personally reviewed and evaluated these images and lab results as part of my medical decision-making.   EKG Interpretation   Date/Time:  Wednesday  July 17 2015 16:34:39 EDT Ventricular Rate:  76 PR Interval:  196 QRS Duration: 94 QT Interval:  371 QTC Calculation: 417 R Axis:   46 Text Interpretation:  Sinus rhythm No significant change since last  tracing Confirmed by YAO  MD, DAVID ([id number]) on 07/17/2015 5:13:22 PM      MDM   Final diagnoses:  None    Nathan Gomez is a 74 y.o. male here with dizziness, lightheadedness. Consider dehydration vs urinary retention. Will get labs, UA, bladder scan. Will get orthostatic and hydrate.   7:07 PM Post void residual 400 cc. Foley placed and about 500 cc came out. Cr slightly elevated 1.6, baseline 1.2. Will keep foley in and dc home with urology f/u. Mildly orthostatic, hydrated in the ED. Retention likely from foley catheter use from recent surgery.    Wandra Arthurs, MD 07/17/15 1910

## 2015-07-19 DIAGNOSIS — I10 Essential (primary) hypertension: Secondary | ICD-10-CM | POA: Diagnosis not present

## 2015-07-19 DIAGNOSIS — E119 Type 2 diabetes mellitus without complications: Secondary | ICD-10-CM | POA: Diagnosis not present

## 2015-07-19 DIAGNOSIS — Z96643 Presence of artificial hip joint, bilateral: Secondary | ICD-10-CM | POA: Diagnosis not present

## 2015-07-19 DIAGNOSIS — Z87891 Personal history of nicotine dependence: Secondary | ICD-10-CM | POA: Diagnosis not present

## 2015-07-19 DIAGNOSIS — Z471 Aftercare following joint replacement surgery: Secondary | ICD-10-CM | POA: Diagnosis not present

## 2015-07-19 DIAGNOSIS — M1711 Unilateral primary osteoarthritis, right knee: Secondary | ICD-10-CM | POA: Diagnosis not present

## 2015-07-24 DIAGNOSIS — N138 Other obstructive and reflux uropathy: Secondary | ICD-10-CM | POA: Diagnosis not present

## 2015-07-24 DIAGNOSIS — Z471 Aftercare following joint replacement surgery: Secondary | ICD-10-CM | POA: Diagnosis not present

## 2015-07-24 DIAGNOSIS — N401 Enlarged prostate with lower urinary tract symptoms: Secondary | ICD-10-CM | POA: Diagnosis not present

## 2015-07-24 DIAGNOSIS — Z96643 Presence of artificial hip joint, bilateral: Secondary | ICD-10-CM | POA: Diagnosis not present

## 2015-07-24 DIAGNOSIS — R339 Retention of urine, unspecified: Secondary | ICD-10-CM | POA: Diagnosis not present

## 2015-07-25 ENCOUNTER — Telehealth: Payer: Self-pay | Admitting: Internal Medicine

## 2015-07-25 NOTE — Telephone Encounter (Signed)
Pt came by office and stated he had a hip replacement recently and had some bladder complications that followed the surgery.  He was seen by Dr. Karsten Ro at [facility].  Dr. Karsten Ro wants pt to find out if he can stop taking the Hydrochlorothiazide 25 mg tablet or reduce it because this may be causing his problem??  Pt would like an answer ASAP because he stated he has been in the ER for issues regarding this.  Pt has an appt already scheduled for 9/13 with Dr. Asa Lente.

## 2015-07-26 NOTE — Telephone Encounter (Signed)
Can you take a look at this please? 

## 2015-07-26 NOTE — Telephone Encounter (Signed)
Would advise to stop until visit. Not clear why he has allergy listed to HCTZ (thiazide diuretic) does not specify reaction. Keep visit with PCP to discuss if he needs alternative.

## 2015-07-26 NOTE — Telephone Encounter (Signed)
Notified pt with md response.../lmb 

## 2015-07-29 DIAGNOSIS — Z96641 Presence of right artificial hip joint: Secondary | ICD-10-CM | POA: Diagnosis not present

## 2015-07-29 DIAGNOSIS — M6281 Muscle weakness (generalized): Secondary | ICD-10-CM | POA: Diagnosis not present

## 2015-07-29 DIAGNOSIS — M25551 Pain in right hip: Secondary | ICD-10-CM | POA: Diagnosis not present

## 2015-07-30 ENCOUNTER — Encounter: Payer: Self-pay | Admitting: Internal Medicine

## 2015-07-30 ENCOUNTER — Ambulatory Visit (INDEPENDENT_AMBULATORY_CARE_PROVIDER_SITE_OTHER): Payer: Medicare Other | Admitting: Internal Medicine

## 2015-07-30 ENCOUNTER — Other Ambulatory Visit (INDEPENDENT_AMBULATORY_CARE_PROVIDER_SITE_OTHER): Payer: Medicare Other

## 2015-07-30 VITALS — BP 110/58 | HR 90 | Temp 97.9°F | Ht 72.0 in | Wt 191.5 lb

## 2015-07-30 DIAGNOSIS — T83511A Infection and inflammatory reaction due to indwelling urethral catheter, initial encounter: Secondary | ICD-10-CM

## 2015-07-30 DIAGNOSIS — M1711 Unilateral primary osteoarthritis, right knee: Secondary | ICD-10-CM

## 2015-07-30 DIAGNOSIS — N390 Urinary tract infection, site not specified: Secondary | ICD-10-CM

## 2015-07-30 DIAGNOSIS — R5383 Other fatigue: Secondary | ICD-10-CM

## 2015-07-30 DIAGNOSIS — Z23 Encounter for immunization: Secondary | ICD-10-CM | POA: Diagnosis not present

## 2015-07-30 DIAGNOSIS — T8351XA Infection and inflammatory reaction due to indwelling urinary catheter, initial encounter: Secondary | ICD-10-CM

## 2015-07-30 DIAGNOSIS — D649 Anemia, unspecified: Secondary | ICD-10-CM

## 2015-07-30 DIAGNOSIS — D62 Acute posthemorrhagic anemia: Secondary | ICD-10-CM

## 2015-07-30 DIAGNOSIS — R339 Retention of urine, unspecified: Secondary | ICD-10-CM

## 2015-07-30 LAB — BASIC METABOLIC PANEL
BUN: 26 mg/dL — ABNORMAL HIGH (ref 6–23)
CO2: 25 mEq/L (ref 19–32)
Calcium: 9.6 mg/dL (ref 8.4–10.5)
Chloride: 101 mEq/L (ref 96–112)
Creatinine, Ser: 1.45 mg/dL (ref 0.40–1.50)
GFR: 50.26 mL/min — ABNORMAL LOW (ref >60.00)
Glucose, Bld: 160 mg/dL — ABNORMAL HIGH (ref 70–99)
Potassium: 5.3 mEq/L — ABNORMAL HIGH (ref 3.5–5.1)
Sodium: 136 mEq/L (ref 135–145)

## 2015-07-30 LAB — CBC WITH DIFFERENTIAL/PLATELET
Basophils Absolute: 0.1 10*3/uL (ref 0.0–0.1)
Basophils Relative: 0.4 % (ref 0.0–3.0)
Eosinophils Absolute: 0.2 10*3/uL (ref 0.0–0.7)
Eosinophils Relative: 1.5 % (ref 0.0–5.0)
HCT: 33.9 % — ABNORMAL LOW (ref 39.0–52.0)
Hemoglobin: 11.3 g/dL — ABNORMAL LOW (ref 13.0–17.0)
Lymphocytes Relative: 10.6 % — ABNORMAL LOW (ref 12.0–46.0)
Lymphs Abs: 1.3 10*3/uL (ref 0.7–4.0)
MCHC: 33.3 g/dL (ref 30.0–36.0)
MCV: 93.6 fl (ref 78.0–100.0)
Monocytes Absolute: 0.8 10*3/uL (ref 0.1–1.0)
Monocytes Relative: 6.7 % (ref 3.0–12.0)
Neutro Abs: 9.9 10*3/uL — ABNORMAL HIGH (ref 1.4–7.7)
Neutrophils Relative %: 80.8 % — ABNORMAL HIGH (ref 43.0–77.0)
Platelets: 502 10*3/uL — ABNORMAL HIGH (ref 150.0–400.0)
RBC: 3.62 Mil/uL — ABNORMAL LOW (ref 4.22–5.81)
RDW: 13.7 % (ref 11.5–15.5)
WBC: 12.2 10*3/uL — ABNORMAL HIGH (ref 4.0–10.5)

## 2015-07-30 LAB — PSA: PSA: 14.22 ng/mL — ABNORMAL HIGH (ref 0.10–4.00)

## 2015-07-30 MED ORDER — LANCETS 30G MISC: Status: AC

## 2015-07-30 MED ORDER — GLUCOSE BLOOD VI STRP: ORAL_STRIP | Status: AC

## 2015-07-30 MED ORDER — BLOOD GLUCOSE MONITOR KIT: PACK | Status: DC

## 2015-07-30 NOTE — Patient Instructions (Addendum)
It was good to see you today.  We have reviewed your prior records including labs and tests today  Your annual flu shot was given and/or updated today.  Test(s) ordered today. Your results will be released to [facility] (or called to you) after review, usually within 72hours after test completion. If any changes need to be made, you will be notified at that same time.  Medications reviewed and updated, no changes recommended at this time. I suspect the Flomax is causing your symptoms, I will check labs today to ensure no other metabolic problem causing lightheadedness and low blood pressure. If labs okay, talk with your urologist about alternate medication in place of Flomax  Okay to remain off diaphoretic hydrochlorothiazide at this time, or until blood pressure begins running over 140   Notify us if sugars remain over 200 for medication adjustment on diabetes as needed  Please schedule followup in 3-6 months, call sooner if problems.

## 2015-07-30 NOTE — Assessment & Plan Note (Signed)
chronic problems with right knee, L4 and right hip reviewed flare late spring 2014 -seen by orthopedic specialist while in [place] who suggested partial knee replacement for medial compartment dz Now following with local orthopedics Dr. Rhona Raider with cortisone injections with improvement; then Supar series 11/2014 Considering surgical options for knee, question total knee replacement versus partial s/p R hip replacement 06/2015 as this was felt to be contributing to knee pain flares - interval reviewed No nocturnal pain, no current joint swelling in knee Continue management as ongoing

## 2015-07-30 NOTE — Assessment & Plan Note (Signed)
Post op anemia in 2013 and again related to ABLA periop 06/2015 No on anticoag for DVT prophlaxis this time, now on ASA 325 post op per ortho Recheck CBC now given symptomatic fatigue and low blood pressure. Consider alternate treatment if progressive symptoms or iron supplement

## 2015-07-30 NOTE — Progress Notes (Signed)
Pre visit review using our clinic review tool, if applicable. No additional management support is needed unless otherwise documented below in the visit note. 

## 2015-07-30 NOTE — Progress Notes (Signed)
Subjective:    Patient ID: Nathan Gomez, male    DOB: 1941-05-12, 74 y.o.   MRN: [id number]  HPI  Patient here for follow up - Also reviewed chronic medical conditions, interval events and current concerns  Past Medical History  Diagnosis Date  . Hypertension   . Diabetes mellitus, type 2   . Dyslipidemia   . Osteoarthritis     right knee  . GERD (gastroesophageal reflux disease)   . Personal history of colonic adenomas 01/16/2013  . 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 Complication of anesthesia   . Heart murmur     child    Review of Systems  Constitutional: Positive for fatigue. Negative for unexpected weight change.  Respiratory: Negative for cough and shortness of breath.   Cardiovascular: Negative for chest pain, palpitations and leg swelling.  Genitourinary: Positive for difficulty urinating (retention post op reviewed - normal PVR by uro last week in ED followup).  Neurological: Positive for light-headedness (position change and early in AM). Negative for dizziness and syncope. Weakness: generalized.      Objective:    Physical Exam  Constitutional: He appears well-developed and well-nourished. No distress.  Cardiovascular: Normal rate, regular rhythm and normal heart sounds.   No murmur heard. Pulmonary/Chest: Effort normal and breath sounds normal. No respiratory distress.    BP 110/58 mmHg  Pulse 90  Temp(Src) 97.9 F (36.6 C) (Oral)  Ht 6' (1.829 m)  Wt 191 lb 8 oz (86.864 kg)  BMI 25.97 kg/m2  SpO2 98% Wt Readings from Last 3 Encounters:  07/30/15 191 lb 8 oz (86.864 kg)  07/09/15 192 lb 12.8 oz (87.454 kg)  06/28/15 192 lb 12.8 oz (87.454 kg)    Lab Results  Component Value Date   WBC 7.2 07/17/2015   HGB 10.1* 07/17/2015   HCT 30.7* 07/17/2015   PLT 350 07/17/2015   GLUCOSE 238* 07/17/2015   CHOL 182 01/29/2015   TRIG 180.0* 01/29/2015   HDL 36.90* 01/29/2015   LDLDIRECT 80.7 02/22/2012   LDLCALC 109* 01/29/2015   ALT  16 06/22/2014   AST 16 06/22/2014   NA 135 07/17/2015   K 4.9 07/17/2015   CL 101 07/17/2015   CREATININE 1.61* 07/17/2015   BUN 17 07/17/2015   CO2 23 07/17/2015   TSH 1.58 02/22/2012   PSA 3.38 01/05/2011   INR 0.99 06/28/2015   HGBA1C 7.5* 06/28/2015   MICROALBUR 1.5 01/29/2015    No results found.     Assessment & Plan:   Problem List Items Addressed This Visit    Anemia    Post op anemia in 2013 and again related to ABLA periop 06/2015 No on anticoag for DVT prophlaxis this time, now on ASA 325 post op per ortho Recheck CBC now given symptomatic fatigue and low blood pressure. Consider alternate treatment if progressive symptoms or iron supplement      Osteoarthritis    chronic problems with right knee, L4 and right hip reviewed flare late spring 2014 -seen by orthopedic specialist while in [place] who suggested partial knee replacement for medial compartment dz Now following with local orthopedics Dr. Rhona Raider with cortisone injections with improvement; then Supar series 11/2014 Considering surgical options for knee, question total knee replacement versus partial s/p R hip replacement 06/2015 as this was felt to be contributing to knee pain flares - interval reviewed No nocturnal pain, no current joint swelling in knee Continue management as ongoing  Urinary retention    Issue postoperatively reviewed with urgent care evaluation August 30. Has been on Flomax since that time and follow-up with urology discussed. Since initiating Flomax, has felt increasingly lightheaded and dizzy upon position change. Check labs today to exclude other metabolic or medical problem contributing to same, but given mild symptomatic hypotension, would consider alternate agent for prostate treatment. To follow-up urology on same neck week as planned      Relevant Orders   PSA    Other Visit Diagnoses    Need for prophylactic vaccination and inoculation against influenza    -   Primary    Relevant Orders    Flu vaccine HIGH DOSE PF (Fluzone High dose)    Other fatigue        Relevant Orders    CBC with Differential/Platelet    Basic metabolic panel    Acute blood loss anemia        Relevant Orders    CBC with Differential/Platelet        Gwendolyn Grant, MD

## 2015-07-30 NOTE — Assessment & Plan Note (Signed)
Issue postoperatively reviewed with urgent care evaluation August 30. Has been on Flomax since that time and follow-up with urology discussed. Since initiating Flomax, has felt increasingly lightheaded and dizzy upon position change. Check labs today to exclude other metabolic or medical problem contributing to same, but given mild symptomatic hypotension, would consider alternate agent for prostate treatment. To follow-up urology on same neck week as planned

## 2015-07-31 ENCOUNTER — Other Ambulatory Visit: Payer: Self-pay | Admitting: Internal Medicine

## 2015-07-31 NOTE — Addendum Note (Signed)
Addended by: Gwendolyn Grant A on: 07/31/2015 11:47 AM   Modules accepted: Orders

## 2015-08-02 ENCOUNTER — Other Ambulatory Visit (INDEPENDENT_AMBULATORY_CARE_PROVIDER_SITE_OTHER): Payer: Medicare Other

## 2015-08-02 DIAGNOSIS — T8351XA Infection and inflammatory reaction due to indwelling urinary catheter, initial encounter: Secondary | ICD-10-CM

## 2015-08-02 DIAGNOSIS — M25551 Pain in right hip: Secondary | ICD-10-CM | POA: Diagnosis not present

## 2015-08-02 DIAGNOSIS — N390 Urinary tract infection, site not specified: Secondary | ICD-10-CM | POA: Diagnosis not present

## 2015-08-02 DIAGNOSIS — R339 Retention of urine, unspecified: Secondary | ICD-10-CM | POA: Diagnosis not present

## 2015-08-02 DIAGNOSIS — T83511A Infection and inflammatory reaction due to indwelling urethral catheter, initial encounter: Secondary | ICD-10-CM

## 2015-08-02 DIAGNOSIS — Z96641 Presence of right artificial hip joint: Secondary | ICD-10-CM | POA: Diagnosis not present

## 2015-08-02 DIAGNOSIS — M6281 Muscle weakness (generalized): Secondary | ICD-10-CM | POA: Diagnosis not present

## 2015-08-02 LAB — URINALYSIS, ROUTINE W REFLEX MICROSCOPIC
Bilirubin Urine: NEGATIVE
Hgb urine dipstick: NEGATIVE
Ketones, ur: NEGATIVE
Leukocytes, UA: NEGATIVE
Nitrite: NEGATIVE
Specific Gravity, Urine: 1.025 (ref 1.000–1.030)
Total Protein, Urine: NEGATIVE
Urine Glucose: NEGATIVE
Urobilinogen, UA: 0.2 (ref 0.0–1.0)
pH: 5.5 (ref 5.0–8.0)

## 2015-08-03 LAB — URINE CULTURE
Colony Count: NO GROWTH
Organism ID, Bacteria: NO GROWTH

## 2015-08-05 DIAGNOSIS — M25551 Pain in right hip: Secondary | ICD-10-CM | POA: Diagnosis not present

## 2015-08-05 DIAGNOSIS — M6281 Muscle weakness (generalized): Secondary | ICD-10-CM | POA: Diagnosis not present

## 2015-08-05 DIAGNOSIS — Z96641 Presence of right artificial hip joint: Secondary | ICD-10-CM | POA: Diagnosis not present

## 2015-08-07 DIAGNOSIS — M25551 Pain in right hip: Secondary | ICD-10-CM | POA: Diagnosis not present

## 2015-08-12 DIAGNOSIS — M25551 Pain in right hip: Secondary | ICD-10-CM | POA: Diagnosis not present

## 2015-08-12 DIAGNOSIS — M6281 Muscle weakness (generalized): Secondary | ICD-10-CM | POA: Diagnosis not present

## 2015-08-12 DIAGNOSIS — Z96641 Presence of right artificial hip joint: Secondary | ICD-10-CM | POA: Diagnosis not present

## 2015-08-20 DIAGNOSIS — N401 Enlarged prostate with lower urinary tract symptoms: Secondary | ICD-10-CM | POA: Diagnosis not present

## 2015-08-20 DIAGNOSIS — R972 Elevated prostate specific antigen [PSA]: Secondary | ICD-10-CM | POA: Diagnosis not present

## 2015-08-20 DIAGNOSIS — R339 Retention of urine, unspecified: Secondary | ICD-10-CM | POA: Diagnosis not present

## 2015-08-20 DIAGNOSIS — N138 Other obstructive and reflux uropathy: Secondary | ICD-10-CM | POA: Diagnosis not present

## 2015-08-21 DIAGNOSIS — M1711 Unilateral primary osteoarthritis, right knee: Secondary | ICD-10-CM | POA: Diagnosis not present

## 2015-08-28 DIAGNOSIS — M1711 Unilateral primary osteoarthritis, right knee: Secondary | ICD-10-CM | POA: Diagnosis not present

## 2015-08-29 ENCOUNTER — Encounter: Payer: Self-pay | Admitting: Internal Medicine

## 2015-09-02 MED ORDER — METFORMIN HCL ER 500 MG PO TB24: 1000.0000 mg | ORAL_TABLET | Freq: Two times a day (BID) | ORAL | Status: DC

## 2015-09-04 DIAGNOSIS — M1711 Unilateral primary osteoarthritis, right knee: Secondary | ICD-10-CM | POA: Diagnosis not present

## 2015-09-26 DIAGNOSIS — R972 Elevated prostate specific antigen [PSA]: Secondary | ICD-10-CM | POA: Diagnosis not present

## 2015-10-07 DIAGNOSIS — M1712 Unilateral primary osteoarthritis, left knee: Secondary | ICD-10-CM | POA: Diagnosis not present

## 2015-10-07 DIAGNOSIS — M25551 Pain in right hip: Secondary | ICD-10-CM | POA: Diagnosis not present

## 2015-10-07 DIAGNOSIS — M1711 Unilateral primary osteoarthritis, right knee: Secondary | ICD-10-CM | POA: Diagnosis not present

## 2015-11-29 ENCOUNTER — Telehealth: Payer: Self-pay

## 2015-11-29 MED ORDER — OMEPRAZOLE 20 MG PO CPDR: 20.0000 mg | DELAYED_RELEASE_CAPSULE | Freq: Every day | ORAL | Status: DC

## 2015-11-29 NOTE — Telephone Encounter (Signed)
Refill rx request from [facility] for omeprazol (small amt) sent to patients pharm, patient has scheduled appt with dr burns--new Nathan Gomez

## 2015-12-02 DIAGNOSIS — M5441 Lumbago with sciatica, right side: Secondary | ICD-10-CM | POA: Diagnosis not present

## 2015-12-02 DIAGNOSIS — M25551 Pain in right hip: Secondary | ICD-10-CM | POA: Diagnosis not present

## 2015-12-04 DIAGNOSIS — M5441 Lumbago with sciatica, right side: Secondary | ICD-10-CM | POA: Diagnosis not present

## 2015-12-04 DIAGNOSIS — M545 Low back pain: Secondary | ICD-10-CM | POA: Diagnosis not present

## 2015-12-06 DIAGNOSIS — M545 Low back pain: Secondary | ICD-10-CM | POA: Diagnosis not present

## 2015-12-06 DIAGNOSIS — M5441 Lumbago with sciatica, right side: Secondary | ICD-10-CM | POA: Diagnosis not present

## 2015-12-09 DIAGNOSIS — M5441 Lumbago with sciatica, right side: Secondary | ICD-10-CM | POA: Diagnosis not present

## 2015-12-09 DIAGNOSIS — M545 Low back pain: Secondary | ICD-10-CM | POA: Diagnosis not present

## 2015-12-12 DIAGNOSIS — M5441 Lumbago with sciatica, right side: Secondary | ICD-10-CM | POA: Diagnosis not present

## 2015-12-12 DIAGNOSIS — M545 Low back pain: Secondary | ICD-10-CM | POA: Diagnosis not present

## 2015-12-16 DIAGNOSIS — M545 Low back pain: Secondary | ICD-10-CM | POA: Diagnosis not present

## 2015-12-16 DIAGNOSIS — M5441 Lumbago with sciatica, right side: Secondary | ICD-10-CM | POA: Diagnosis not present

## 2015-12-19 DIAGNOSIS — M545 Low back pain: Secondary | ICD-10-CM | POA: Diagnosis not present

## 2015-12-19 DIAGNOSIS — M5441 Lumbago with sciatica, right side: Secondary | ICD-10-CM | POA: Diagnosis not present

## 2015-12-23 DIAGNOSIS — M545 Low back pain: Secondary | ICD-10-CM | POA: Diagnosis not present

## 2015-12-23 DIAGNOSIS — M5441 Lumbago with sciatica, right side: Secondary | ICD-10-CM | POA: Diagnosis not present

## 2015-12-26 DIAGNOSIS — M5441 Lumbago with sciatica, right side: Secondary | ICD-10-CM | POA: Diagnosis not present

## 2015-12-26 DIAGNOSIS — M545 Low back pain: Secondary | ICD-10-CM | POA: Diagnosis not present

## 2016-01-01 DIAGNOSIS — R972 Elevated prostate specific antigen [PSA]: Secondary | ICD-10-CM | POA: Diagnosis not present

## 2016-01-01 DIAGNOSIS — Z09 Encounter for follow-up examination after completed treatment for conditions other than malignant neoplasm: Secondary | ICD-10-CM | POA: Diagnosis not present

## 2016-01-01 DIAGNOSIS — Z96643 Presence of artificial hip joint, bilateral: Secondary | ICD-10-CM | POA: Diagnosis not present

## 2016-01-27 ENCOUNTER — Ambulatory Visit: Payer: Medicare Other | Admitting: Internal Medicine

## 2016-01-27 ENCOUNTER — Ambulatory Visit (INDEPENDENT_AMBULATORY_CARE_PROVIDER_SITE_OTHER): Payer: Medicare Other | Admitting: Internal Medicine

## 2016-01-27 ENCOUNTER — Encounter: Payer: Self-pay | Admitting: Internal Medicine

## 2016-01-27 VITALS — BP 130/70 | HR 74 | Temp 97.7°F | Resp 16 | Wt 187.0 lb

## 2016-01-27 DIAGNOSIS — E119 Type 2 diabetes mellitus without complications: Secondary | ICD-10-CM

## 2016-01-27 DIAGNOSIS — I10 Essential (primary) hypertension: Secondary | ICD-10-CM

## 2016-01-27 DIAGNOSIS — E785 Hyperlipidemia, unspecified: Secondary | ICD-10-CM

## 2016-01-27 DIAGNOSIS — D510 Vitamin B12 deficiency anemia due to intrinsic factor deficiency: Secondary | ICD-10-CM

## 2016-01-27 MED ORDER — LISINOPRIL 20 MG PO TABS: 20.0000 mg | ORAL_TABLET | Freq: Every day | ORAL | Status: DC

## 2016-01-27 NOTE — Assessment & Plan Note (Signed)
On Lipitor  Check lipid panel, CMP Improve diet Continue regular exercise

## 2016-01-27 NOTE — Assessment & Plan Note (Signed)
Check A1c Continue regular exercise Work on improving diet Continue metformin at current dose-may need to adjust depending on A1c

## 2016-01-27 NOTE — Assessment & Plan Note (Signed)
Well-controlled here today Continue current medications Check CMP

## 2016-01-27 NOTE — Patient Instructions (Addendum)
   Test(s) ordered today. Your results will be released to [place] (or called to you) after review, usually within 72hours after test completion. If any changes need to be made, you will be notified at that same time.  All other Health Maintenance issues reviewed.   All recommended immunizations and age-appropriate screenings are up-to-date.  No immunizations administered today.   Medications reviewed and updated.  No changes recommended at this time.  Your prescription(s) have been submitted to your pharmacy. Please take as directed and contact our office if you believe you are having problem(s) with the medication(s).  Please followup in 6 months for a wellness visit.

## 2016-01-27 NOTE — Progress Notes (Signed)
Pre visit review using our clinic review tool, if applicable. No additional management support is needed unless otherwise documented below in the visit note. 

## 2016-01-27 NOTE — Assessment & Plan Note (Signed)
Not currently on B12 Check B12 level, folate

## 2016-01-27 NOTE — Progress Notes (Signed)
Subjective:    Patient ID: Nathan Gomez, male    DOB: 15-May-1941, 75 y.o.   MRN: [id number]  HPI He is here to establish with a new pcp.   He is here for routine follow up.   Diabetes: He is taking his medication daily as prescribed. He is not compliant with a diabetic diet. He is exercising regularly. He monitors his sugars and they have been running 120-135.   Hypertension: He is taking his medication daily. He is compliant with a low sodium diet.  He denies chest pain, palpitations, edema, shortness of breath and regular headaches. He is exercising regularly.  He does not monitor his blood pressure at home.    Edema:  He was on hydrochlorothiazide, but his previous PCP. The medication. He does experience lites, but it is mild and does not cause any discomfort. He is compliant with a low sodium diet.  Hyperlipidemia: He is taking his medication daily. He is compliant with a low fat/cholesterol diet. He is exercising regularly. He denies myalgias.   Elevated PSA, urinary retention:  He is following with urology.  His PSA has come down.  He has not had a biopsy yet, but will do a biopsy in 6 months if it is still elevated.    Pernicious anemia:   He is currently not taking B12 supplementation.   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  He just completed PT for his back and is now doing the exercises at home.  He is able to walk more - about one mile, but then has pain.   He is taking celebrex daily and that helps.   GERD:  He is taking his medication daily as prescribed.  He denies any GERD symptoms and feels his GERD is well controlled.     Medications and allergies reviewed with patient and updated if appropriate.  Patient Active Problem List   Diagnosis Date Noted  . Urinary retention 07/30/2015  . Primary osteoarthritis of right hip 07/09/2015  . Personal history of colonic adenomas 01/16/2013  . Pernicious anemia 02/22/2012  . Anemia     . Diabetes mellitus type 2, controlled ([place]) 01/05/2011  . Hyperlipidemia 01/05/2011  . Essential hypertension 01/05/2011  . Osteoarthritis 01/05/2011    Current Outpatient Prescriptions on File Prior to Visit  Medication Sig Dispense Refill  . amLODipine (NORVASC) 10 MG tablet Take 1 tablet (10 mg total) by mouth daily. 90 tablet 3  . aspirin EC 325 MG EC tablet Take 1 tablet (325 mg total) by mouth 2 (two) times daily. 60 tablet 0  . atorvastatin (LIPITOR) 40 MG tablet Take 0.5 tablets (20 mg total) by mouth daily. 135 tablet 1  . blood glucose meter kit and supplies KIT Use to test blood sugar up to twice a day. DX E11.09 1 each 0  . Cholecalciferol (VITAMIN D) 2000 UNITS CAPS Take 1 capsule (2,000 Units total) by mouth every morning. 90 capsule 3  . Glucosamine Sulfate POWD by Does not apply route.      Marland Kitchen glucose blood test strip Use as instructed to test blood sugar up to twice a day. DX:  E11.09 200 each 3  . hydrochlorothiazide (HYDRODIURIL) 25 MG tablet Take 1 tablet (25 mg total) by mouth every morning. 90 tablet 3  . Inositol Niacinate (NIACIN FLUSH FREE) 500 MG CAPS Take 1 capsule by mouth at bedtime.      Javier Docker  Oil 300 MG CAPS Take 500 mg by mouth daily.     . Lancets 46T MISC Delica Fine Lancets. Use to test blood sugar up to twice a day. DX E11.09 200 each 3  . lisinopril (PRINIVIL,ZESTRIL) 20 MG tablet Take 1 tablet (20 mg total) by mouth daily. 90 tablet 3  . loratadine (CLARITIN) 10 MG tablet Take 1 tablet (10 mg total) by mouth daily. (Patient taking differently: Take 10 mg by mouth daily as needed. ) 90 tablet 3  . magnesium oxide (MAG-OX) 400 MG tablet Take 1 tablet (400 mg total) by mouth 2 (two) times daily. 180 tablet 3  . metFORMIN (GLUCOPHAGE-XR) 500 MG 24 hr tablet Take 2 tablets (1,000 mg total) by mouth 2 (two) times daily. 360 tablet 3  . Multiple Vitamins-Minerals (SENIOR MULTIVITAMIN PLUS) TABS Take 1 tablet by mouth daily.      Marland Kitchen omeprazole (PRILOSEC) 20 MG  capsule Take 1 capsule (20 mg total) by mouth at bedtime. --patient has scheduled appt with new pcp--dr Khristi Schiller (Patient taking differently: Take 20 mg by mouth at bedtime. Takes every other day.) 90 capsule 0  . Probiotic Product (SOLUBLE FIBER/PROBIOTICS) CHEW Chew 2 tablets by mouth daily.    . tamsulosin (FLOMAX) 0.4 MG CAPS capsule Take 1 capsule (0.4 mg total) by mouth at bedtime. 14 capsule 0  . Triamcinolone Acetonide (NASACORT AQ NA) Place 1 spray into the nose daily as needed (allergies).    . [DISCONTINUED] tadalafil (CIALIS) 20 MG tablet Take 0.5-1 tablets (10-20 mg total) by mouth every other day as needed for erectile dysfunction. (Patient not taking: Reported on 06/28/2015) 30 tablet 0   No current facility-administered medications on file prior to visit.    Past Medical History  Diagnosis Date  . Hypertension   . Diabetes mellitus, type 2   . Dyslipidemia   . Osteoarthritis     right knee  . GERD (gastroesophageal reflux disease)   . Personal history of colonic adenomas 01/16/2013  . 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 Complication of anesthesia   . Heart murmur     child    Past Surgical History  Procedure Laterality Date  . Tonsillectomy  1945  . Total hip arthroplasty  2011    Left  . Cataract extraction  08/2012 L, 09/2012 R  . Colonoscopy    . Tonsillectomy    . Total hip arthroplasty Right 07/09/2015    Procedure: TOTAL HIP ARTHROPLASTY ANTERIOR APPROACH;  Surgeon: Melrose Nakayama, MD;  Location: [facility];  Service: Orthopedics;  Laterality: Right;    Social History   Social History  . Marital Status: Married    Spouse Name: N/A  . Number of Children: N/A  . Years of Education: N/A   Social History Main Topics  . Smoking status: Former Smoker -- 1.00 packs/day for 20 years    Quit date: 11/16/1986  . Smokeless tobacco: Never Used  . Alcohol Use: 1.8 oz/week    3 Glasses of wine per week  . Drug Use: No  . Sexual Activity: Not on file    Other Topics Concern  . Not on file   Social History Narrative    Family History  Problem Relation Age of Onset  . Ovarian cancer Mother   . Hypertension Father   . Diabetes Maternal Grandfather   . Pneumonia Paternal Grandfather     Review of Systems  Constitutional: Negative for fever.  Respiratory: Positive for shortness of breath (top of  stairs if he goes up too quickly, not with any other type of exertion). Negative for cough and wheezing.   Cardiovascular: Positive for leg swelling (mild edema in lower legs). Negative for chest pain and palpitations.  Gastrointestinal: Negative for abdominal pain.       GERD controlled  Neurological: Negative for dizziness, light-headedness and headaches.       Objective:   Filed Vitals:   01/27/16 1049  BP: 130/70  Pulse: 74  Temp: 97.7 F (36.5 C)  Resp: 16   Filed Weights   01/27/16 1049  Weight: 187 lb (84.823 kg)   Body mass index is 25.36 kg/(m^2).   Physical Exam Constitutional: Appears well-developed and well-nourished. No distress.  Neck: Neck supple. No tracheal deviation present. No thyromegaly present.  No carotid bruit. No cervical adenopathy.   Cardiovascular: Normal rate, regular rhythm and normal heart sounds.   No murmur heard.  No edema Pulmonary/Chest: Effort normal and breath sounds normal. No respiratory distress. No wheezes.       Assessment & Plan:   See Problem List for Assessment and Plan of chronic medical problems.  Follow-up in 6 months

## 2016-01-29 ENCOUNTER — Other Ambulatory Visit (INDEPENDENT_AMBULATORY_CARE_PROVIDER_SITE_OTHER): Payer: Medicare Other

## 2016-01-29 DIAGNOSIS — I10 Essential (primary) hypertension: Secondary | ICD-10-CM

## 2016-01-29 DIAGNOSIS — E119 Type 2 diabetes mellitus without complications: Secondary | ICD-10-CM

## 2016-01-29 DIAGNOSIS — D510 Vitamin B12 deficiency anemia due to intrinsic factor deficiency: Secondary | ICD-10-CM | POA: Diagnosis not present

## 2016-01-29 DIAGNOSIS — E785 Hyperlipidemia, unspecified: Secondary | ICD-10-CM

## 2016-01-29 LAB — IRON: Iron: 90 ug/dL (ref 42–165)

## 2016-01-29 LAB — HEMOGLOBIN A1C: Hgb A1c MFr Bld: 6.6 % — ABNORMAL HIGH (ref 4.6–6.5)

## 2016-01-29 LAB — CBC WITH DIFFERENTIAL/PLATELET
Basophils Absolute: 0 10*3/uL (ref 0.0–0.1)
Basophils Relative: 0.4 % (ref 0.0–3.0)
Eosinophils Absolute: 0.2 10*3/uL (ref 0.0–0.7)
Eosinophils Relative: 2.4 % (ref 0.0–5.0)
HCT: 35.9 % — ABNORMAL LOW (ref 39.0–52.0)
Hemoglobin: 12 g/dL — ABNORMAL LOW (ref 13.0–17.0)
Lymphocytes Relative: 20.8 % (ref 12.0–46.0)
Lymphs Abs: 1.3 10*3/uL (ref 0.7–4.0)
MCHC: 33.6 g/dL (ref 30.0–36.0)
MCV: 91.8 fl (ref 78.0–100.0)
Monocytes Absolute: 0.4 10*3/uL (ref 0.1–1.0)
Monocytes Relative: 6.8 % (ref 3.0–12.0)
Neutro Abs: 4.5 10*3/uL (ref 1.4–7.7)
Neutrophils Relative %: 69.6 % (ref 43.0–77.0)
Platelets: 269 10*3/uL (ref 150.0–400.0)
RBC: 3.91 Mil/uL — ABNORMAL LOW (ref 4.22–5.81)
RDW: 13.4 % (ref 11.5–15.5)
WBC: 6.4 10*3/uL (ref 4.0–10.5)

## 2016-01-29 LAB — LIPID PANEL
Cholesterol: 153 mg/dL (ref 0–200)
HDL: 31.6 mg/dL — ABNORMAL LOW (ref >39.00)
LDL Cholesterol: 88 mg/dL (ref 0–99)
NonHDL: 121.38
Total CHOL/HDL Ratio: 5
Triglycerides: 168 mg/dL — ABNORMAL HIGH (ref 0.0–149.0)
VLDL: 33.6 mg/dL (ref 0.0–40.0)

## 2016-01-29 LAB — COMPREHENSIVE METABOLIC PANEL
ALT: 12 U/L (ref 0–53)
AST: 13 U/L (ref 0–37)
Albumin: 4.1 g/dL (ref 3.5–5.2)
Alkaline Phosphatase: 87 U/L (ref 39–117)
BUN: 25 mg/dL — ABNORMAL HIGH (ref 6–23)
CO2: 26 mEq/L (ref 19–32)
Calcium: 9.4 mg/dL (ref 8.4–10.5)
Chloride: 105 mEq/L (ref 96–112)
Creatinine, Ser: 1.24 mg/dL (ref 0.40–1.50)
GFR: 60.12 mL/min (ref >60.00)
Glucose, Bld: 155 mg/dL — ABNORMAL HIGH (ref 70–99)
Potassium: 4.5 mEq/L (ref 3.5–5.1)
Sodium: 140 mEq/L (ref 135–145)
Total Bilirubin: 0.4 mg/dL (ref 0.2–1.2)
Total Protein: 6.9 g/dL (ref 6.0–8.3)

## 2016-01-29 LAB — TSH: TSH: 2.67 u[IU]/mL (ref 0.35–4.50)

## 2016-01-29 LAB — FERRITIN: Ferritin: 54.8 ng/mL (ref 22.0–322.0)

## 2016-01-29 LAB — VITAMIN B12: Vitamin B-12: 189 pg/mL — ABNORMAL LOW (ref 211–911)

## 2016-01-30 ENCOUNTER — Encounter: Payer: Self-pay | Admitting: Internal Medicine

## 2016-01-30 DIAGNOSIS — E538 Deficiency of other specified B group vitamins: Secondary | ICD-10-CM | POA: Insufficient documentation

## 2016-02-04 ENCOUNTER — Other Ambulatory Visit: Payer: Self-pay | Admitting: Internal Medicine

## 2016-02-04 MED ORDER — ATORVASTATIN CALCIUM 40 MG PO TABS: 20.0000 mg | ORAL_TABLET | Freq: Every day | ORAL | Status: DC

## 2016-02-04 NOTE — Telephone Encounter (Signed)
What medication

## 2016-02-04 NOTE — Telephone Encounter (Signed)
Do not see on Pts current med list, should this be refilled?

## 2016-03-09 DIAGNOSIS — M1711 Unilateral primary osteoarthritis, right knee: Secondary | ICD-10-CM | POA: Diagnosis not present

## 2016-03-09 DIAGNOSIS — M1712 Unilateral primary osteoarthritis, left knee: Secondary | ICD-10-CM | POA: Diagnosis not present

## 2016-03-16 DIAGNOSIS — M1711 Unilateral primary osteoarthritis, right knee: Secondary | ICD-10-CM | POA: Diagnosis not present

## 2016-03-22 ENCOUNTER — Other Ambulatory Visit: Payer: Self-pay | Admitting: Internal Medicine

## 2016-03-23 DIAGNOSIS — M1711 Unilateral primary osteoarthritis, right knee: Secondary | ICD-10-CM | POA: Diagnosis not present

## 2016-03-25 DIAGNOSIS — M5416 Radiculopathy, lumbar region: Secondary | ICD-10-CM | POA: Diagnosis not present

## 2016-03-26 ENCOUNTER — Encounter: Payer: Self-pay | Admitting: Internal Medicine

## 2016-03-27 ENCOUNTER — Other Ambulatory Visit: Payer: Self-pay | Admitting: Emergency Medicine

## 2016-03-27 MED ORDER — METFORMIN HCL ER 500 MG PO TB24: 1000.0000 mg | ORAL_TABLET | Freq: Two times a day (BID) | ORAL | Status: DC

## 2016-03-27 MED ORDER — TAMSULOSIN HCL 0.4 MG PO CAPS: 0.4000 mg | ORAL_CAPSULE | Freq: Every day | ORAL | Status: AC

## 2016-03-30 ENCOUNTER — Other Ambulatory Visit: Payer: Self-pay | Admitting: Emergency Medicine

## 2016-03-30 DIAGNOSIS — M1711 Unilateral primary osteoarthritis, right knee: Secondary | ICD-10-CM | POA: Diagnosis not present

## 2016-03-30 MED ORDER — AMLODIPINE BESYLATE 10 MG PO TABS: 10.0000 mg | ORAL_TABLET | Freq: Every day | ORAL | Status: DC

## 2016-04-01 ENCOUNTER — Telehealth: Payer: Self-pay | Admitting: Emergency Medicine

## 2016-04-01 MED ORDER — HYDROCHLOROTHIAZIDE 25 MG PO TABS: 25.0000 mg | ORAL_TABLET | Freq: Every morning | ORAL | Status: DC

## 2016-04-01 NOTE — Telephone Encounter (Signed)
I think he is supposed to still be on it so I sent it to the pharmacy.

## 2016-04-01 NOTE — Telephone Encounter (Signed)
Refill request from Mail order for Hydrochlorthiazide. Not on pts current med list. Should he still be taking this?

## 2016-04-17 ENCOUNTER — Encounter: Payer: Self-pay | Admitting: Internal Medicine

## 2016-04-17 DIAGNOSIS — M1712 Unilateral primary osteoarthritis, left knee: Secondary | ICD-10-CM | POA: Diagnosis not present

## 2016-04-17 DIAGNOSIS — M1711 Unilateral primary osteoarthritis, right knee: Secondary | ICD-10-CM | POA: Diagnosis not present

## 2016-04-18 ENCOUNTER — Other Ambulatory Visit: Payer: Self-pay | Admitting: Internal Medicine

## 2016-04-20 DIAGNOSIS — Z961 Presence of intraocular lens: Secondary | ICD-10-CM | POA: Diagnosis not present

## 2016-04-20 DIAGNOSIS — E119 Type 2 diabetes mellitus without complications: Secondary | ICD-10-CM | POA: Diagnosis not present

## 2016-04-20 LAB — HM DIABETES EYE EXAM

## 2016-04-21 DIAGNOSIS — M79671 Pain in right foot: Secondary | ICD-10-CM | POA: Diagnosis not present

## 2016-04-21 DIAGNOSIS — M5416 Radiculopathy, lumbar region: Secondary | ICD-10-CM | POA: Diagnosis not present

## 2016-04-21 DIAGNOSIS — M79672 Pain in left foot: Secondary | ICD-10-CM | POA: Diagnosis not present

## 2016-04-24 DIAGNOSIS — M1712 Unilateral primary osteoarthritis, left knee: Secondary | ICD-10-CM | POA: Diagnosis not present

## 2016-04-27 ENCOUNTER — Other Ambulatory Visit: Payer: Self-pay | Admitting: Internal Medicine

## 2016-05-01 DIAGNOSIS — M79672 Pain in left foot: Secondary | ICD-10-CM | POA: Diagnosis not present

## 2016-05-01 DIAGNOSIS — M1712 Unilateral primary osteoarthritis, left knee: Secondary | ICD-10-CM | POA: Diagnosis not present

## 2016-05-01 DIAGNOSIS — M79671 Pain in right foot: Secondary | ICD-10-CM | POA: Diagnosis not present

## 2016-05-08 DIAGNOSIS — M1712 Unilateral primary osteoarthritis, left knee: Secondary | ICD-10-CM | POA: Diagnosis not present

## 2016-05-08 DIAGNOSIS — M722 Plantar fascial fibromatosis: Secondary | ICD-10-CM | POA: Diagnosis not present

## 2016-05-15 DIAGNOSIS — M1712 Unilateral primary osteoarthritis, left knee: Secondary | ICD-10-CM | POA: Diagnosis not present

## 2016-05-30 DIAGNOSIS — M1711 Unilateral primary osteoarthritis, right knee: Secondary | ICD-10-CM | POA: Diagnosis not present

## 2016-06-04 DIAGNOSIS — M5416 Radiculopathy, lumbar region: Secondary | ICD-10-CM | POA: Diagnosis not present

## 2016-06-09 ENCOUNTER — Encounter: Payer: Self-pay | Admitting: Internal Medicine

## 2016-06-16 ENCOUNTER — Other Ambulatory Visit: Payer: Self-pay | Admitting: Orthopaedic Surgery

## 2016-06-16 DIAGNOSIS — R972 Elevated prostate specific antigen [PSA]: Secondary | ICD-10-CM | POA: Diagnosis not present

## 2016-06-20 ENCOUNTER — Encounter: Payer: Self-pay | Admitting: Internal Medicine

## 2016-06-22 ENCOUNTER — Other Ambulatory Visit: Payer: Self-pay | Admitting: Emergency Medicine

## 2016-06-22 MED ORDER — HYDROCHLOROTHIAZIDE 25 MG PO TABS: 25.0000 mg | ORAL_TABLET | Freq: Every morning | ORAL | 1 refills | Status: DC

## 2016-06-22 MED ORDER — AMLODIPINE BESYLATE 10 MG PO TABS: 10.0000 mg | ORAL_TABLET | Freq: Every day | ORAL | 1 refills | Status: DC

## 2016-07-01 DIAGNOSIS — M1711 Unilateral primary osteoarthritis, right knee: Secondary | ICD-10-CM | POA: Diagnosis not present

## 2016-07-01 DIAGNOSIS — M1712 Unilateral primary osteoarthritis, left knee: Secondary | ICD-10-CM | POA: Diagnosis not present

## 2016-07-01 DIAGNOSIS — M5416 Radiculopathy, lumbar region: Secondary | ICD-10-CM | POA: Diagnosis not present

## 2016-07-02 DIAGNOSIS — R972 Elevated prostate specific antigen [PSA]: Secondary | ICD-10-CM | POA: Diagnosis not present

## 2016-07-02 DIAGNOSIS — N401 Enlarged prostate with lower urinary tract symptoms: Secondary | ICD-10-CM | POA: Diagnosis not present

## 2016-07-02 DIAGNOSIS — R3914 Feeling of incomplete bladder emptying: Secondary | ICD-10-CM | POA: Diagnosis not present

## 2016-07-09 ENCOUNTER — Encounter (HOSPITAL_COMMUNITY): Payer: Self-pay

## 2016-07-09 NOTE — Pre-Procedure Instructions (Signed)
IBRAHEM WIECEK  07/09/2016      [facility] ([facility] [place] [id number] [place] [address] Phone: [phone] Fax: [phone]  [facility] [place] [address] [place] [address] Phone: [phone] Fax: [phone]  [id number] [address] [place] [address] Phone: [phone] Fax: [phone]    Your procedure is scheduled on Tuesday, July 21, 2016  Report to [facility] Admitting at 11:00 A.M.  Call this number if you have problems the morning of surgery:  [phone]   Remember:  Do not eat food or drink liquids after midnight Monday, July 20, 2016  Take these medicines the morning of surgery with A SIP OF WATER : Amlodipine ( Norvasc), if needed: Loratadine ( Claritin) for allergies, Nasacort nasal spray for allergies Stop taking Aspirin,  vitamins,  fish oil,  Krill Oil,  Glucosamine,  Probiotics and herbal medications. Do not take any NSAIDs ie: Ibuprofen, Advil, Naproxen, BC and Goody Powder or any medication containing Aspirin such as Celecoxib ( Celebrex); stop Tuesday, July 14, 2016    How to Manage Your Diabetes Before and After Surgery  Why is it important to control my blood sugar before and after surgery? . Improving blood sugar levels before and after surgery helps healing and can limit problems. . A way of improving blood sugar control is eating a healthy diet by: o  Eating less sugar and carbohydrates o  Increasing activity/exercise o  Talking with your doctor about reaching your blood sugar goals . High blood sugars (greater than 180 mg/dL) can raise your risk of infections and slow your recovery, so you will need to focus on controlling your diabetes during the weeks before surgery. . Make sure that the doctor who  takes care of your diabetes knows about your planned surgery including the date and location.  How do I manage my blood sugar before surgery? . Check your blood sugar at least 4 times a day, starting 2 days before surgery, to make sure that the level is not too high or low. o Check your blood sugar the morning of your surgery when you wake up and every 2 hours until you get to the Short Stay unit. . If your blood sugar is less than 70 mg/dL, you will need to treat for low blood sugar: o Do not take insulin. o Treat a low blood sugar (less than 70 mg/dL) with  cup of clear juice (cranberry or apple), 4 glucose tablets, OR glucose gel. o Recheck blood sugar in 15 minutes after treatment (to make sure it is greater than 70 mg/dL). If your blood sugar is not greater than 70 mg/dL on recheck, call [phone] for further instructions. . Report your blood sugar to the short stay nurse when you get to Short Stay.  . If you are admitted to the hospital after surgery: o Your blood sugar will be checked by the staff and you will probably be given insulin after surgery (instead of oral diabetes medicines) to make sure you have good blood sugar levels. o The goal for blood sugar control after surgery is 80-180 mg/dL.  WHAT DO I DO ABOUT MY DIABETES MEDICATION?   Marland Kitchen Do not take oral diabetes medicines (pills) the morning of surgery such as Metformin ( Glucophage)  Day of Surgery  Do not apply any lotions/deoderants the morning of surgery.  Please wear clean clothes to the hospital/surgery center.  Do not wear jewelry, make-up or nail polish.  Do not wear lotions, powders, or perfumes, or deoderant.  Do not shave 48 hours prior to surgery.  Men may shave face and neck.  Do not bring valuables to the hospital.  [facility] is not responsible for any belongings or valuables.  Contacts, dentures or bridgework may not be worn into surgery.  Leave your suitcase in the car.  After surgery it may  be brought to your room.  For patients admitted to the hospital, discharge time will be determined by your treatment team.  Patients discharged the day of surgery will not be allowed to drive home.   Name and phone number of your driver:   Special instructions: Shower the night before surgery and the day of surgery with CHG.  Please read over the following fact sheets that you were given. Pain Booklet, Coughing and Deep Breathing, Blood Transfusion Information, Total Joint Packet, MRSA Information and Surgical Site Infection Prevention

## 2016-07-10 ENCOUNTER — Encounter (HOSPITAL_COMMUNITY): Payer: Self-pay

## 2016-07-10 ENCOUNTER — Encounter (HOSPITAL_COMMUNITY)
Admission: RE | Admit: 2016-07-10 | Discharge: 2016-07-10 | Disposition: A | Discharge status: Home / Self Care | Payer: Medicare Other | Source: Ambulatory Visit | Attending: Orthopaedic Surgery | Admitting: Orthopaedic Surgery

## 2016-07-10 ENCOUNTER — Ambulatory Visit (HOSPITAL_COMMUNITY)
Admission: RE | Admit: 2016-07-10 | Discharge: 2016-07-10 | Disposition: A | Discharge status: Home / Self Care | Payer: Medicare Other | Source: Ambulatory Visit | Attending: Orthopaedic Surgery | Admitting: Orthopaedic Surgery

## 2016-07-10 DIAGNOSIS — Z01812 Encounter for preprocedural laboratory examination: Secondary | ICD-10-CM | POA: Insufficient documentation

## 2016-07-10 DIAGNOSIS — Z0183 Encounter for blood typing: Secondary | ICD-10-CM | POA: Diagnosis not present

## 2016-07-10 DIAGNOSIS — M1711 Unilateral primary osteoarthritis, right knee: Secondary | ICD-10-CM | POA: Diagnosis not present

## 2016-07-10 DIAGNOSIS — Z01818 Encounter for other preprocedural examination: Secondary | ICD-10-CM

## 2016-07-10 HISTORY — DX: Plantar fascial fibromatosis: M72.2

## 2016-07-10 HISTORY — DX: Achilles tendinitis, unspecified leg: M76.60

## 2016-07-10 LAB — URINALYSIS, ROUTINE W REFLEX MICROSCOPIC
Bilirubin Urine: NEGATIVE
Glucose, UA: NEGATIVE mg/dL
Hgb urine dipstick: NEGATIVE
Ketones, ur: NEGATIVE mg/dL
Leukocytes, UA: NEGATIVE
Nitrite: NEGATIVE
Protein, ur: NEGATIVE mg/dL
Specific Gravity, Urine: 1.022 (ref 1.005–1.030)
pH: 5 (ref 5.0–8.0)

## 2016-07-10 LAB — CBC WITH DIFFERENTIAL/PLATELET
Basophils Absolute: 0 10*3/uL (ref 0.0–0.1)
Basophils Relative: 0 %
Eosinophils Absolute: 0.2 10*3/uL (ref 0.0–0.7)
Eosinophils Relative: 2 %
HCT: 38 % — ABNORMAL LOW (ref 39.0–52.0)
Hemoglobin: 11.9 g/dL — ABNORMAL LOW (ref 13.0–17.0)
Lymphocytes Relative: 17 %
Lymphs Abs: 1.2 10*3/uL (ref 0.7–4.0)
MCH: 30.1 pg (ref 26.0–34.0)
MCHC: 31.3 g/dL (ref 30.0–36.0)
MCV: 96.2 fL (ref 78.0–100.0)
Monocytes Absolute: 0.4 10*3/uL (ref 0.1–1.0)
Monocytes Relative: 5 %
Neutro Abs: 5.3 10*3/uL (ref 1.7–7.7)
Neutrophils Relative %: 76 %
Platelets: 220 10*3/uL (ref 150–400)
RBC: 3.95 MIL/uL — ABNORMAL LOW (ref 4.22–5.81)
RDW: 13.6 % (ref 11.5–15.5)
WBC: 7 10*3/uL (ref 4.0–10.5)

## 2016-07-10 LAB — BASIC METABOLIC PANEL
Anion gap: 5 (ref 5–15)
BUN: 31 mg/dL — ABNORMAL HIGH (ref 6–20)
CO2: 21 mmol/L — ABNORMAL LOW (ref 22–32)
Calcium: 9.4 mg/dL (ref 8.9–10.3)
Chloride: 112 mmol/L — ABNORMAL HIGH (ref 101–111)
Creatinine, Ser: 1.44 mg/dL — ABNORMAL HIGH (ref 0.61–1.24)
GFR calc Af Amer: 53 mL/min — ABNORMAL LOW (ref >60)
GFR calc non Af Amer: 45 mL/min — ABNORMAL LOW (ref >60)
Glucose, Bld: 213 mg/dL — ABNORMAL HIGH (ref 65–99)
Potassium: 4.7 mmol/L (ref 3.5–5.1)
Sodium: 138 mmol/L (ref 135–145)

## 2016-07-10 LAB — TYPE AND SCREEN
ABO/RH(D): O POS
Antibody Screen: NEGATIVE

## 2016-07-10 LAB — GLUCOSE, CAPILLARY: Glucose-Capillary: 158 mg/dL — ABNORMAL HIGH (ref 65–99)

## 2016-07-10 LAB — APTT: aPTT: 32 seconds (ref 24–36)

## 2016-07-10 LAB — PROTIME-INR
INR: 0.99
Prothrombin Time: 13.1 seconds (ref 11.4–15.2)

## 2016-07-10 LAB — SURGICAL PCR SCREEN
MRSA, PCR: NEGATIVE
Staphylococcus aureus: NEGATIVE

## 2016-07-10 NOTE — Progress Notes (Signed)
PCP: Dr. Manley Mason @ [facility]. She manages diabetes. Blood sugars fasting 120-130 No cardiologist. Had stress test 10 + yrs. Ago for work related along with physical-states normal.

## 2016-07-11 LAB — HEMOGLOBIN A1C
Hgb A1c MFr Bld: 6.9 % — ABNORMAL HIGH (ref 4.8–5.6)
Mean Plasma Glucose: 151 mg/dL

## 2016-07-17 NOTE — H&P (Signed)
TOTAL KNEE ADMISSION H&P  Patient is being admitted for right total knee arthroplasty.  Subjective:  Chief Complaint:right knee pain.  HPI: Nathan Gomez, 75 y.o. male, has a history of pain and functional disability in the right knee due to arthritis and has failed non-surgical conservative treatments for greater than 12 weeks to includeNSAID's and/or analgesics, corticosteriod injections, viscosupplementation injections, flexibility and strengthening excercises, use of assistive devices, weight reduction as appropriate and activity modification.  Onset of symptoms was gradual, starting 5 years ago with gradually worsening course since that time. The patient noted no past surgery on the right knee(s).  Patient currently rates pain in the right knee(s) at 10 out of 10 with activity. Patient has night pain, worsening of pain with activity and weight bearing, pain that interferes with activities of daily living, crepitus and joint swelling.  Patient has evidence of subchondral cysts, subchondral sclerosis, periarticular osteophytes and joint space narrowing by imaging studies. There is no active infection.  Patient Active Problem List   Diagnosis Date Noted  . B12 deficiency 01/30/2016  . Urinary retention 07/30/2015  . Primary osteoarthritis of right hip 07/09/2015  . Personal history of colonic adenomas 01/16/2013  . Pernicious anemia 02/22/2012  . Anemia   . Diabetes mellitus type 2, controlled ([facility]) 01/05/2011  . Hyperlipidemia 01/05/2011  . Essential hypertension 01/05/2011  . Osteoarthritis 01/05/2011   Past Medical History:  Diagnosis Date  . Achilles tendinitis   . Complication of anesthesia    urinary retention  . 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 Diabetes mellitus, type 2 ([facility])   . Dyslipidemia   . GERD (gastroesophageal reflux disease)   . Heart murmur    child  . Hypertension   . Osteoarthritis    right knee  . Personal history of colonic adenomas  01/16/2013  . Plantar fasciitis     Past Surgical History:  Procedure Laterality Date  . CATARACT EXTRACTION  08/2012 L, 09/2012 R  . COLONOSCOPY    . TONSILLECTOMY  1945  . TONSILLECTOMY    . TOTAL HIP ARTHROPLASTY  2011   Left  . TOTAL HIP ARTHROPLASTY Right 07/09/2015   Procedure: TOTAL HIP ARTHROPLASTY ANTERIOR APPROACH;  Surgeon: Melrose Nakayama, MD;  Location: [facility];  Service: Orthopedics;  Laterality: Right;    No prescriptions prior to admission.   Allergies  Allergen Reactions  . Sulfa Antibiotics Other (See Comments)    As child    Not sure rxn  . Thiazide-Type Diuretics Other (See Comments)    Unknown    Social History  Substance Use Topics  . Smoking status: Former Smoker    Packs/day: 1.00    Years: 20.00    Quit date: 11/16/1986  . Smokeless tobacco: Never Used  . Alcohol use 0.6 - 1.2 oz/week    1 - 2 Glasses of wine per week    Family History  Problem Relation Age of Onset  . Ovarian cancer Mother   . Hypertension Father   . Diabetes Maternal Grandfather   . Pneumonia Paternal Grandfather      Review of Systems  Musculoskeletal: Positive for joint pain.       Right knee  All other systems reviewed and are negative.   Objective:  Physical Exam  Constitutional: He is oriented to person, place, and time. He appears well-developed and well-nourished.  HENT:  Head: Normocephalic and atraumatic.  Eyes: Pupils are equal, round, and reactive to light.  Neck: Normal range of motion.  Cardiovascular: Normal rate and regular rhythm.   Respiratory: Effort normal.  GI: Soft.  Musculoskeletal:  His knees move about 0-120.  He has some terrible joint line pain on the right with crepitation as well.  I don't feel an effusion.  Hip motion does not cause any leg pain on either side.  The left knee is minimally tender today with no effusion and minimal crepitation.   Neurological: He is alert and oriented to person, place, and time.  Skin: Skin is warm and dry.   Psychiatric: He has a normal mood and affect. His behavior is normal. Judgment and thought content normal.    Vital signs in last 24 hours:    Labs:   Estimated body mass index is 26.89 kg/m as calculated from the following:   Height as of 07/10/16: 6' (1.829 m).   Weight as of 07/10/16: 89.9 kg (198 lb 4.8 oz).   Imaging Review Plain radiographs demonstrate severe degenerative joint disease of the right knee(s). The overall alignment isneutral. The bone quality appears to be good for age and reported activity level.  Assessment/Plan:  End stage primary arthritis, right knee   The patient history, physical examination, clinical judgment of the provider and imaging studies are consistent with end stage degenerative joint disease of the right knee(s) and total knee arthroplasty is deemed medically necessary. The treatment options including medical management, injection therapy arthroscopy and arthroplasty were discussed at length. The risks and benefits of total knee arthroplasty were presented and reviewed. The risks due to aseptic loosening, infection, stiffness, patella tracking problems, thromboembolic complications and other imponderables were discussed. The patient acknowledged the explanation, agreed to proceed with the plan and consent was signed. Patient is being admitted for inpatient treatment for surgery, pain control, PT, OT, prophylactic antibiotics, VTE prophylaxis, progressive ambulation and ADL's and discharge planning. The patient is planning to be discharged home with home health services

## 2016-07-21 ENCOUNTER — Inpatient Hospital Stay (HOSPITAL_COMMUNITY): Payer: Medicare Other | Admitting: Anesthesiology

## 2016-07-21 ENCOUNTER — Encounter (HOSPITAL_COMMUNITY)
Admission: RE | Disposition: A | Discharge status: Skilled Nursing Facility | Payer: Self-pay | Source: Ambulatory Visit | Attending: Orthopaedic Surgery

## 2016-07-21 ENCOUNTER — Encounter (HOSPITAL_COMMUNITY): Payer: Self-pay | Admitting: *Deleted

## 2016-07-21 ENCOUNTER — Inpatient Hospital Stay (HOSPITAL_COMMUNITY)
Admission: RE | Admit: 2016-07-21 | Discharge: 2016-07-23 | DRG: 470 | Disposition: A | Discharge status: Skilled Nursing Facility | Payer: Medicare Other | Source: Ambulatory Visit | Attending: Orthopaedic Surgery | Admitting: Orthopaedic Surgery

## 2016-07-21 DIAGNOSIS — K219 Gastro-esophageal reflux disease without esophagitis: Secondary | ICD-10-CM | POA: Diagnosis present

## 2016-07-21 DIAGNOSIS — E1122 Type 2 diabetes mellitus with diabetic chronic kidney disease: Secondary | ICD-10-CM

## 2016-07-21 DIAGNOSIS — M179 Osteoarthritis of knee, unspecified: Secondary | ICD-10-CM | POA: Diagnosis not present

## 2016-07-21 DIAGNOSIS — Z9842 Cataract extraction status, left eye: Secondary | ICD-10-CM | POA: Diagnosis not present

## 2016-07-21 DIAGNOSIS — Z96643 Presence of artificial hip joint, bilateral: Secondary | ICD-10-CM | POA: Diagnosis present

## 2016-07-21 DIAGNOSIS — R011 Cardiac murmur, unspecified: Secondary | ICD-10-CM | POA: Diagnosis not present

## 2016-07-21 DIAGNOSIS — E538 Deficiency of other specified B group vitamins: Secondary | ICD-10-CM | POA: Diagnosis not present

## 2016-07-21 DIAGNOSIS — M722 Plantar fascial fibromatosis: Secondary | ICD-10-CM | POA: Diagnosis not present

## 2016-07-21 DIAGNOSIS — Z96651 Presence of right artificial knee joint: Secondary | ICD-10-CM | POA: Diagnosis not present

## 2016-07-21 DIAGNOSIS — M25561 Pain in right knee: Secondary | ICD-10-CM | POA: Diagnosis not present

## 2016-07-21 DIAGNOSIS — Z87891 Personal history of nicotine dependence: Secondary | ICD-10-CM

## 2016-07-21 DIAGNOSIS — E785 Hyperlipidemia, unspecified: Secondary | ICD-10-CM | POA: Diagnosis present

## 2016-07-21 DIAGNOSIS — I10 Essential (primary) hypertension: Secondary | ICD-10-CM | POA: Diagnosis not present

## 2016-07-21 DIAGNOSIS — M1711 Unilateral primary osteoarthritis, right knee: Secondary | ICD-10-CM | POA: Diagnosis not present

## 2016-07-21 DIAGNOSIS — N1832 Chronic kidney disease, stage 3b: Secondary | ICD-10-CM

## 2016-07-21 DIAGNOSIS — D510 Vitamin B12 deficiency anemia due to intrinsic factor deficiency: Secondary | ICD-10-CM | POA: Diagnosis not present

## 2016-07-21 DIAGNOSIS — R339 Retention of urine, unspecified: Secondary | ICD-10-CM | POA: Diagnosis not present

## 2016-07-21 DIAGNOSIS — Z9841 Cataract extraction status, right eye: Secondary | ICD-10-CM

## 2016-07-21 DIAGNOSIS — M25569 Pain in unspecified knee: Secondary | ICD-10-CM | POA: Diagnosis not present

## 2016-07-21 DIAGNOSIS — E119 Type 2 diabetes mellitus without complications: Secondary | ICD-10-CM

## 2016-07-21 DIAGNOSIS — Z86010 Personal history of colonic polyps: Secondary | ICD-10-CM | POA: Diagnosis not present

## 2016-07-21 DIAGNOSIS — G894 Chronic pain syndrome: Secondary | ICD-10-CM | POA: Diagnosis not present

## 2016-07-21 DIAGNOSIS — Z471 Aftercare following joint replacement surgery: Secondary | ICD-10-CM | POA: Diagnosis not present

## 2016-07-21 HISTORY — PX: TOTAL KNEE ARTHROPLASTY: SHX125

## 2016-07-21 LAB — GLUCOSE, CAPILLARY
Glucose-Capillary: 156 mg/dL — ABNORMAL HIGH (ref 65–99)
Glucose-Capillary: 160 mg/dL — ABNORMAL HIGH (ref 65–99)

## 2016-07-21 SURGERY — ARTHROPLASTY, KNEE, TOTAL
Anesthesia: Spinal | Site: Knee | Laterality: Right

## 2016-07-21 MED ORDER — HYDROCODONE-ACETAMINOPHEN 7.5-325 MG PO TABS: 1.0000 | ORAL_TABLET | Freq: Once | ORAL | Status: DC | PRN

## 2016-07-21 MED ORDER — BUPIVACAINE HCL (PF) 0.75 % IJ SOLN
INTRAMUSCULAR | Status: DC | PRN
  Administered 2016-07-21: 2 mL via INTRATHECAL

## 2016-07-21 MED ORDER — MIDAZOLAM HCL 2 MG/2ML IJ SOLN
INTRAMUSCULAR | Status: AC
  Filled 2016-07-21: qty 2

## 2016-07-21 MED ORDER — TRANEXAMIC ACID 1000 MG/10ML IV SOLN
1000.0000 mg | Freq: Once | INTRAVENOUS | Status: DC
  Filled 2016-07-21: qty 10

## 2016-07-21 MED ORDER — BISACODYL 5 MG PO TBEC: 5.0000 mg | DELAYED_RELEASE_TABLET | Freq: Every day | ORAL | Status: DC | PRN

## 2016-07-21 MED ORDER — ACETAMINOPHEN 325 MG PO TABS
650.0000 mg | ORAL_TABLET | Freq: Four times a day (QID) | ORAL | Status: DC | PRN
  Administered 2016-07-21 – 2016-07-22 (×2): 650 mg via ORAL
  Filled 2016-07-21 (×3): qty 2

## 2016-07-21 MED ORDER — PHENYLEPHRINE HCL 10 MG/ML IJ SOLN
INTRAMUSCULAR | Status: DC | PRN
  Administered 2016-07-21 (×5): 80 ug via INTRAVENOUS

## 2016-07-21 MED ORDER — PHENOL 1.4 % MT LIQD: 1.0000 | OROMUCOSAL | Status: DC | PRN

## 2016-07-21 MED ORDER — METFORMIN HCL ER 500 MG PO TB24
1000.0000 mg | ORAL_TABLET | Freq: Two times a day (BID) | ORAL | Status: DC
  Administered 2016-07-21 – 2016-07-23 (×4): 1000 mg via ORAL
  Filled 2016-07-21 (×4): qty 2

## 2016-07-21 MED ORDER — SODIUM CHLORIDE 0.9 % IR SOLN
Status: DC | PRN
  Administered 2016-07-21: 3000 mL

## 2016-07-21 MED ORDER — EPHEDRINE SULFATE 50 MG/ML IJ SOLN
INTRAMUSCULAR | Status: DC | PRN
  Administered 2016-07-21: 10 mg via INTRAVENOUS
  Administered 2016-07-21: 15 mg via INTRAVENOUS
  Administered 2016-07-21 (×2): 10 mg via INTRAVENOUS

## 2016-07-21 MED ORDER — LIDOCAINE 2% (20 MG/ML) 5 ML SYRINGE
INTRAMUSCULAR | Status: AC
  Filled 2016-07-21: qty 5

## 2016-07-21 MED ORDER — FLUTICASONE PROPIONATE 50 MCG/ACT NA SUSP
1.0000 | Freq: Every day | NASAL | Status: DC
  Administered 2016-07-22 – 2016-07-23 (×2): 1 via NASAL
  Filled 2016-07-21: qty 16

## 2016-07-21 MED ORDER — CEFAZOLIN SODIUM-DEXTROSE 2-4 GM/100ML-% IV SOLN
2.0000 g | INTRAVENOUS | Status: AC
  Administered 2016-07-21: 2 g via INTRAVENOUS
  Filled 2016-07-21: qty 100

## 2016-07-21 MED ORDER — ALUM & MAG HYDROXIDE-SIMETH 200-200-20 MG/5ML PO SUSP: 30.0000 mL | ORAL | Status: DC | PRN

## 2016-07-21 MED ORDER — HYDROCODONE-ACETAMINOPHEN 5-325 MG PO TABS
ORAL_TABLET | ORAL | Status: AC
  Filled 2016-07-21: qty 2

## 2016-07-21 MED ORDER — LISINOPRIL 20 MG PO TABS
20.0000 mg | ORAL_TABLET | Freq: Every day | ORAL | Status: DC
  Administered 2016-07-22 – 2016-07-23 (×2): 20 mg via ORAL
  Filled 2016-07-21 (×2): qty 1

## 2016-07-21 MED ORDER — NIACIN ER 500 MG PO TBCR
EXTENDED_RELEASE_TABLET | Freq: Every day | ORAL | Status: DC
  Administered 2016-07-21: 22:00:00 via ORAL
  Administered 2016-07-22: 500 mg via ORAL
  Filled 2016-07-21 (×3): qty 1

## 2016-07-21 MED ORDER — ONDANSETRON HCL 4 MG/2ML IJ SOLN
INTRAMUSCULAR | Status: AC
  Filled 2016-07-21: qty 2

## 2016-07-21 MED ORDER — DIPHENHYDRAMINE HCL 12.5 MG/5ML PO ELIX: 12.5000 mg | ORAL_SOLUTION | ORAL | Status: DC | PRN

## 2016-07-21 MED ORDER — EPHEDRINE 5 MG/ML INJ
INTRAVENOUS | Status: AC
  Filled 2016-07-21: qty 10

## 2016-07-21 MED ORDER — ONDANSETRON HCL 4 MG/2ML IJ SOLN
INTRAMUSCULAR | Status: DC | PRN
  Administered 2016-07-21: 4 mg via INTRAVENOUS

## 2016-07-21 MED ORDER — LACTATED RINGERS IV SOLN: INTRAVENOUS | Status: DC

## 2016-07-21 MED ORDER — ONDANSETRON HCL 4 MG PO TABS: 4.0000 mg | ORAL_TABLET | Freq: Four times a day (QID) | ORAL | Status: DC | PRN

## 2016-07-21 MED ORDER — PROPOFOL 1000 MG/100ML IV EMUL
INTRAVENOUS | Status: AC
  Filled 2016-07-21: qty 200

## 2016-07-21 MED ORDER — FENTANYL CITRATE (PF) 100 MCG/2ML IJ SOLN
INTRAMUSCULAR | Status: AC
  Filled 2016-07-21: qty 2

## 2016-07-21 MED ORDER — ONDANSETRON HCL 4 MG/2ML IJ SOLN: 4.0000 mg | Freq: Four times a day (QID) | INTRAMUSCULAR | Status: DC | PRN

## 2016-07-21 MED ORDER — ONDANSETRON HCL 4 MG/2ML IJ SOLN: 4.0000 mg | Freq: Once | INTRAMUSCULAR | Status: DC | PRN

## 2016-07-21 MED ORDER — TRANEXAMIC ACID 1000 MG/10ML IV SOLN
2000.0000 mg | INTRAVENOUS | Status: AC
  Administered 2016-07-21: 2000 mg via TOPICAL
  Filled 2016-07-21: qty 20

## 2016-07-21 MED ORDER — METOCLOPRAMIDE HCL 5 MG/ML IJ SOLN: 5.0000 mg | Freq: Three times a day (TID) | INTRAMUSCULAR | Status: DC | PRN

## 2016-07-21 MED ORDER — MENTHOL 3 MG MT LOZG: 1.0000 | LOZENGE | OROMUCOSAL | Status: DC | PRN

## 2016-07-21 MED ORDER — ATORVASTATIN CALCIUM 20 MG PO TABS
20.0000 mg | ORAL_TABLET | Freq: Every day | ORAL | Status: DC
  Administered 2016-07-21 – 2016-07-22 (×2): 20 mg via ORAL
  Filled 2016-07-21 (×2): qty 1

## 2016-07-21 MED ORDER — PROPOFOL 10 MG/ML IV BOLUS
INTRAVENOUS | Status: DC | PRN
  Administered 2016-07-21 (×2): 20 mg via INTRAVENOUS

## 2016-07-21 MED ORDER — HYDROMORPHONE HCL 1 MG/ML IJ SOLN
0.2500 mg | INTRAMUSCULAR | Status: DC | PRN
  Administered 2016-07-21 (×4): 0.5 mg via INTRAVENOUS

## 2016-07-21 MED ORDER — HYDROMORPHONE HCL 1 MG/ML IJ SOLN
INTRAMUSCULAR | Status: AC
  Administered 2016-07-21: 0.5 mg via INTRAVENOUS
  Filled 2016-07-21: qty 1

## 2016-07-21 MED ORDER — BUPIVACAINE-EPINEPHRINE (PF) 0.5% -1:200000 IJ SOLN
INTRAMUSCULAR | Status: AC
  Filled 2016-07-21: qty 30

## 2016-07-21 MED ORDER — CHLORHEXIDINE GLUCONATE 4 % EX LIQD: 60.0000 mL | Freq: Once | CUTANEOUS | Status: DC

## 2016-07-21 MED ORDER — CEFAZOLIN SODIUM-DEXTROSE 2-4 GM/100ML-% IV SOLN
2.0000 g | Freq: Four times a day (QID) | INTRAVENOUS | Status: AC
  Administered 2016-07-21: 2 g via INTRAVENOUS
  Filled 2016-07-21 (×3): qty 100

## 2016-07-21 MED ORDER — HYDROMORPHONE HCL 1 MG/ML IJ SOLN
0.5000 mg | INTRAMUSCULAR | Status: DC | PRN
  Administered 2016-07-22 – 2016-07-23 (×4): 1 mg via INTRAVENOUS
  Filled 2016-07-21 (×4): qty 1

## 2016-07-21 MED ORDER — FENTANYL CITRATE (PF) 100 MCG/2ML IJ SOLN
INTRAMUSCULAR | Status: DC | PRN
  Administered 2016-07-21 (×2): 50 ug via INTRAVENOUS

## 2016-07-21 MED ORDER — PROPOFOL 10 MG/ML IV BOLUS
INTRAVENOUS | Status: AC
  Filled 2016-07-21: qty 20

## 2016-07-21 MED ORDER — PANTOPRAZOLE SODIUM 40 MG PO TBEC
40.0000 mg | DELAYED_RELEASE_TABLET | Freq: Every day | ORAL | Status: DC
Start: 2016-07-21 — End: 2016-07-23
  Administered 2016-07-22 – 2016-07-23 (×2): 40 mg via ORAL
  Filled 2016-07-21 (×2): qty 1

## 2016-07-21 MED ORDER — MIDAZOLAM HCL 5 MG/5ML IJ SOLN
INTRAMUSCULAR | Status: DC | PRN
  Administered 2016-07-21: 2 mg via INTRAVENOUS

## 2016-07-21 MED ORDER — PROPOFOL 500 MG/50ML IV EMUL
INTRAVENOUS | Status: DC | PRN
  Administered 2016-07-21: 100 ug/kg/min via INTRAVENOUS

## 2016-07-21 MED ORDER — DOCUSATE SODIUM 100 MG PO CAPS
100.0000 mg | ORAL_CAPSULE | Freq: Two times a day (BID) | ORAL | Status: DC
  Administered 2016-07-21 – 2016-07-23 (×4): 100 mg via ORAL
  Filled 2016-07-21 (×4): qty 1

## 2016-07-21 MED ORDER — LACTATED RINGERS IV SOLN
INTRAVENOUS | Status: DC
  Administered 2016-07-21 (×2): via INTRAVENOUS

## 2016-07-21 MED ORDER — GLYCOPYRROLATE 0.2 MG/ML IV SOSY
PREFILLED_SYRINGE | INTRAVENOUS | Status: AC
  Filled 2016-07-21: qty 3

## 2016-07-21 MED ORDER — HYDROCHLOROTHIAZIDE 25 MG PO TABS
25.0000 mg | ORAL_TABLET | Freq: Every morning | ORAL | Status: DC
  Administered 2016-07-22 – 2016-07-23 (×2): 25 mg via ORAL
  Filled 2016-07-21 (×2): qty 1

## 2016-07-21 MED ORDER — PHENYLEPHRINE HCL 10 MG/ML IJ SOLN
INTRAMUSCULAR | Status: DC | PRN
  Administered 2016-07-21: 10 ug/min via INTRAVENOUS

## 2016-07-21 MED ORDER — LIDOCAINE HCL (CARDIAC) 20 MG/ML IV SOLN
INTRAVENOUS | Status: DC | PRN
  Administered 2016-07-21: 100 mg via INTRATRACHEAL

## 2016-07-21 MED ORDER — METHOCARBAMOL 1000 MG/10ML IJ SOLN
500.0000 mg | INTRAMUSCULAR | Status: AC
  Administered 2016-07-21: 500 mg via INTRAVENOUS
  Filled 2016-07-21: qty 5

## 2016-07-21 MED ORDER — METHOCARBAMOL 500 MG PO TABS
500.0000 mg | ORAL_TABLET | Freq: Four times a day (QID) | ORAL | Status: DC | PRN
  Administered 2016-07-22 (×3): 500 mg via ORAL
  Filled 2016-07-21 (×3): qty 1

## 2016-07-21 MED ORDER — BUPIVACAINE LIPOSOME 1.3 % IJ SUSP
20.0000 mL | INTRAMUSCULAR | Status: AC
  Administered 2016-07-21: 20 mL
  Filled 2016-07-21: qty 20

## 2016-07-21 MED ORDER — PHENYLEPHRINE 40 MCG/ML (10ML) SYRINGE FOR IV PUSH (FOR BLOOD PRESSURE SUPPORT)
PREFILLED_SYRINGE | INTRAVENOUS | Status: AC
  Filled 2016-07-21: qty 10

## 2016-07-21 MED ORDER — ASPIRIN EC 325 MG PO TBEC
325.0000 mg | DELAYED_RELEASE_TABLET | Freq: Two times a day (BID) | ORAL | Status: DC
  Administered 2016-07-22 – 2016-07-23 (×3): 325 mg via ORAL
  Filled 2016-07-21 (×3): qty 1

## 2016-07-21 MED ORDER — TAMSULOSIN HCL 0.4 MG PO CAPS
0.8000 mg | ORAL_CAPSULE | Freq: Every day | ORAL | Status: DC
  Administered 2016-07-21 – 2016-07-22 (×2): 0.8 mg via ORAL
  Filled 2016-07-21 (×2): qty 2

## 2016-07-21 MED ORDER — TRANEXAMIC ACID 1000 MG/10ML IV SOLN
1000.0000 mg | INTRAVENOUS | Status: AC
  Administered 2016-07-21: 1000 mg via INTRAVENOUS
  Filled 2016-07-21: qty 10

## 2016-07-21 MED ORDER — ACETAMINOPHEN 650 MG RE SUPP: 650.0000 mg | Freq: Four times a day (QID) | RECTAL | Status: DC | PRN

## 2016-07-21 MED ORDER — GLYCOPYRROLATE 0.2 MG/ML IJ SOLN
INTRAMUSCULAR | Status: DC | PRN
  Administered 2016-07-21: 0.2 mg via INTRAVENOUS

## 2016-07-21 MED ORDER — LORATADINE 10 MG PO TABS
10.0000 mg | ORAL_TABLET | Freq: Every day | ORAL | Status: DC | PRN
  Administered 2016-07-22: 10 mg via ORAL
  Filled 2016-07-21: qty 1

## 2016-07-21 MED ORDER — HYDROCODONE-ACETAMINOPHEN 5-325 MG PO TABS
1.0000 | ORAL_TABLET | ORAL | Status: DC | PRN
  Administered 2016-07-21 – 2016-07-22 (×5): 2 via ORAL
  Filled 2016-07-21 (×4): qty 2

## 2016-07-21 MED ORDER — SODIUM CHLORIDE 0.9 % IJ SOLN
INTRAMUSCULAR | Status: DC | PRN
  Administered 2016-07-21: 40 mL via INTRAVENOUS

## 2016-07-21 MED ORDER — BUPIVACAINE-EPINEPHRINE (PF) 0.5% -1:200000 IJ SOLN
INTRAMUSCULAR | Status: DC | PRN
  Administered 2016-07-21: 20 mL

## 2016-07-21 MED ORDER — METOCLOPRAMIDE HCL 5 MG PO TABS: 5.0000 mg | ORAL_TABLET | Freq: Three times a day (TID) | ORAL | Status: DC | PRN

## 2016-07-21 MED ORDER — 0.9 % SODIUM CHLORIDE (POUR BTL) OPTIME
TOPICAL | Status: DC | PRN
  Administered 2016-07-21: 1000 mL

## 2016-07-21 MED ORDER — AMLODIPINE BESYLATE 10 MG PO TABS
10.0000 mg | ORAL_TABLET | Freq: Every day | ORAL | Status: DC
  Administered 2016-07-22 – 2016-07-23 (×2): 10 mg via ORAL
  Filled 2016-07-21 (×2): qty 1

## 2016-07-21 MED ORDER — DEXTROSE 5 % IV SOLN
500.0000 mg | Freq: Four times a day (QID) | INTRAVENOUS | Status: DC | PRN
  Filled 2016-07-21: qty 5

## 2016-07-21 SURGICAL SUPPLY — 70 items
BAG DECANTER FOR FLEXI CONT (MISCELLANEOUS) IMPLANT
BANDAGE ACE 4X5 VEL STRL LF (GAUZE/BANDAGES/DRESSINGS) ×2 IMPLANT
BANDAGE ACE 6X5 VEL STRL LF (GAUZE/BANDAGES/DRESSINGS) ×1 IMPLANT
BANDAGE ESMARK 6X9 LF (GAUZE/BANDAGES/DRESSINGS) ×1 IMPLANT
BLADE SAGITTAL 25.0X1.19X90 (BLADE) IMPLANT
BLADE SAW SGTL 13.0X1.19X90.0M (BLADE) IMPLANT
BLADE SURG ROTATE 9660 (MISCELLANEOUS) IMPLANT
BNDG CMPR 9X6 STRL LF SNTH (GAUZE/BANDAGES/DRESSINGS) ×1
BNDG CMPR MED 10X6 ELC LF (GAUZE/BANDAGES/DRESSINGS) ×1
BNDG ELASTIC 6X10 VLCR STRL LF (GAUZE/BANDAGES/DRESSINGS) ×2 IMPLANT
BNDG ESMARK 6X9 LF (GAUZE/BANDAGES/DRESSINGS) ×2
BNDG GAUZE ELAST 4 BULKY (GAUZE/BANDAGES/DRESSINGS) ×3 IMPLANT
BOWL SMART MIX CTS (DISPOSABLE) ×2 IMPLANT
CAP KNEE TOTAL 3 SIGMA ×1 IMPLANT
CEMENT HV SMART SET (Cement) ×4 IMPLANT
CLSR STERI-STRIP ANTIMIC 1/2X4 (GAUZE/BANDAGES/DRESSINGS) ×1 IMPLANT
COVER SURGICAL LIGHT HANDLE (MISCELLANEOUS) ×3 IMPLANT
CUFF TOURNIQUET SINGLE 34IN LL (TOURNIQUET CUFF) ×2 IMPLANT
CUFF TOURNIQUET SINGLE 44IN (TOURNIQUET CUFF) IMPLANT
DECANTER SPIKE VIAL GLASS SM (MISCELLANEOUS) ×2 IMPLANT
DRAPE EXTREMITY T 121X128X90 (DRAPE) ×2 IMPLANT
DRAPE PROXIMA HALF (DRAPES) ×2 IMPLANT
DRAPE U-SHAPE 47X51 STRL (DRAPES) ×2 IMPLANT
DRSG ADAPTIC 3X8 NADH LF (GAUZE/BANDAGES/DRESSINGS) ×2 IMPLANT
DRSG PAD ABDOMINAL 8X10 ST (GAUZE/BANDAGES/DRESSINGS) ×2 IMPLANT
DURAPREP 26ML APPLICATOR (WOUND CARE) ×2 IMPLANT
ELECT REM PT RETURN 9FT ADLT (ELECTROSURGICAL) ×2
ELECTRODE REM PT RTRN 9FT ADLT (ELECTROSURGICAL) ×1 IMPLANT
GAUZE SPONGE 4X4 12PLY STRL (GAUZE/BANDAGES/DRESSINGS) ×2 IMPLANT
GLOVE BIO SURGEON STRL SZ8 (GLOVE) ×4 IMPLANT
GLOVE BIOGEL PI IND STRL 8 (GLOVE) ×2 IMPLANT
GLOVE BIOGEL PI INDICATOR 8 (GLOVE) ×2
GOWN STRL REUS W/ TWL LRG LVL3 (GOWN DISPOSABLE) ×1 IMPLANT
GOWN STRL REUS W/ TWL XL LVL3 (GOWN DISPOSABLE) ×2 IMPLANT
GOWN STRL REUS W/TWL LRG LVL3 (GOWN DISPOSABLE) ×2
GOWN STRL REUS W/TWL XL LVL3 (GOWN DISPOSABLE) ×4
HANDPIECE INTERPULSE COAX TIP (DISPOSABLE) ×2
HOOD PEEL AWAY FACE SHEILD DIS (HOOD) ×4 IMPLANT
IMMOBILIZER KNEE 22 UNIV (SOFTGOODS) ×2 IMPLANT
KIT BASIN OR (CUSTOM PROCEDURE TRAY) ×2 IMPLANT
KIT ROOM TURNOVER OR (KITS) ×2 IMPLANT
MANIFOLD NEPTUNE II (INSTRUMENTS) ×2 IMPLANT
NDL HYPO 21X1 ECLIPSE (NEEDLE) ×1 IMPLANT
NEEDLE HYPO 21X1 ECLIPSE (NEEDLE) ×2 IMPLANT
NS IRRIG 1000ML POUR BTL (IV SOLUTION) ×2 IMPLANT
PACK TOTAL JOINT (CUSTOM PROCEDURE TRAY) ×2 IMPLANT
PACK UNIVERSAL I (CUSTOM PROCEDURE TRAY) ×2 IMPLANT
PAD ABD 8X10 STRL (GAUZE/BANDAGES/DRESSINGS) ×1 IMPLANT
PAD ARMBOARD 7.5X6 YLW CONV (MISCELLANEOUS) ×4 IMPLANT
PAD CAST 4YDX4 CTTN HI CHSV (CAST SUPPLIES) IMPLANT
PADDING CAST COTTON 4X4 STRL (CAST SUPPLIES) ×2
PADDING CAST COTTON 6X4 STRL (CAST SUPPLIES) ×1 IMPLANT
SET HNDPC FAN SPRY TIP SCT (DISPOSABLE) ×1 IMPLANT
SPONGE GAUZE 4X4 12PLY STER LF (GAUZE/BANDAGES/DRESSINGS) ×1 IMPLANT
STAPLER VISISTAT 35W (STAPLE) IMPLANT
STRIP CLOSURE SKIN 1/2X4 (GAUZE/BANDAGES/DRESSINGS) ×2 IMPLANT
SUCTION FRAZIER HANDLE 10FR (MISCELLANEOUS)
SUCTION TUBE FRAZIER 10FR DISP (MISCELLANEOUS) IMPLANT
SUT MNCRL AB 3-0 PS2 18 (SUTURE) IMPLANT
SUT VIC AB 0 CT1 27 (SUTURE) ×4
SUT VIC AB 0 CT1 27XBRD ANBCTR (SUTURE) ×2 IMPLANT
SUT VIC AB 2-0 CT1 27 (SUTURE) ×4
SUT VIC AB 2-0 CT1 TAPERPNT 27 (SUTURE) ×2 IMPLANT
SUT VLOC 180 0 24IN GS25 (SUTURE) ×2 IMPLANT
SYR 50ML LL SCALE MARK (SYRINGE) ×2 IMPLANT
TOWEL OR 17X24 6PK STRL BLUE (TOWEL DISPOSABLE) ×2 IMPLANT
TOWEL OR 17X26 10 PK STRL BLUE (TOWEL DISPOSABLE) ×2 IMPLANT
TRAY FOLEY CATH 14FR (SET/KITS/TRAYS/PACK) IMPLANT
TRAY FOLEY CATH 16FR SILVER (SET/KITS/TRAYS/PACK) ×1 IMPLANT
WATER STERILE IRR 1000ML POUR (IV SOLUTION) ×4 IMPLANT

## 2016-07-21 NOTE — Progress Notes (Signed)
Orthopedic Tech Progress Note Patient Details:  Nathan Gomez 1941/08/07 [id number]  Patient ID: [name], male   DOB: 11-04-1941, 75 y.o.   MRN: [id number]   Braulio Bosch 07/21/2016, 6:47 PM Ortho visit put on cpm at [facility]

## 2016-07-21 NOTE — Op Note (Signed)
PREOP DIAGNOSIS: DJD RIGHT KNEE POSTOP DIAGNOSIS: same PROCEDURE: RIGHT TKR ANESTHESIA: Spinal and MAC ATTENDING SURGEON: Kaniah Rizzolo Gomez ASSISTANT: Loni Dolly PA  INDICATIONS FOR PROCEDURE: Nathan Gomez is a 75 y.o. male who has struggled for a long time with pain due to degenerative arthritis of the right knee.  The patient has failed many conservative non-operative measures and at this point has pain which limits the ability to sleep and walk.  The patient is offered total knee replacement.  Informed operative consent was obtained after discussion of possible risks of anesthesia, infection, neurovascular injury, DVT, and death.  The importance of the post-operative rehabilitation protocol to optimize result was stressed extensively with the patient.  SUMMARY OF FINDINGS AND PROCEDURE:  Nathan Gomez was taken to the operative suite where under the above anesthesia a right knee replacement was performed.  There were advanced degenerative changes and the bone quality was good.  We used the DePuy LCS system and placed size standard plus femur, 4MBT revision tibia, 38 mm all polyethylene patella, and a size 10 mm spacer.  Loni Dolly PA-C assisted throughout and was invaluable to the completion of the case in that he helped retract and maintain exposure while I placed components.  He also helped close thereby minimizing OR time.  The patient was admitted for appropriate post-op care to include perioperative antibiotics and mechanical and pharmacologic measures for DVT prophylaxis.  DESCRIPTION OF PROCEDURE:  Nathan Gomez was taken to the operative suite where the above anesthesia was applied.  The patient was positioned supine and prepped and draped in normal sterile fashion.  An appropriate time out was performed.  After the administration of kefzol pre-op antibiotic the leg was elevated and exsanguinated and a tourniquet inflated. A standard longitudinal incision was made on the anterior  knee.  Dissection was carried down to the extensor mechanism.  All appropriate anti-infective measures were used including the pre-operative antibiotic, betadine impregnated drape, and closed hooded exhaust systems for each member of the surgical team.  A medial parapatellar incision was made in the extensor mechanism and the knee cap flipped and the knee flexed.  Some residual meniscal tissues were removed along with any remaining ACL/PCL tissue.  A guide was placed on the tibia and a flat cut was made on it's superior surface.  An intramedullary guide was placed in the femur and was utilized to make anterior and posterior cuts creating an appropriate flexion gap.  A second intramedullary guide was placed in the femur to make a distal cut properly balancing the knee with an extension gap equal to the flexion gap.  The three bones sized to the above mentioned sizes and the appropriate guides were placed and utilized.  A trial reduction was done and the knee easily came to full extension and the patella tracked well on flexion.  The trial components were removed and all bones were cleaned with pulsatile lavage and then dried thoroughly.  Cement was mixed and was pressurized onto the bones followed by placement of the aforementioned components.  Excess cement was trimmed and pressure was held on the components until the cement had hardened.  The tourniquet was deflated and a small amount of bleeding was controlled with cautery and pressure.  The knee was irrigated thoroughly.  The extensor mechanism was re-approximated with V-loc suture in running fashion.  The knee was flexed and the repair was solid.  The subcutaneous tissues were re-approximated with #0 and #2-0 vicryl and the skin closed  with a subcuticular stitch and steristrips.  A sterile dressing was applied.  Intraoperative fluids, EBL, and tourniquet time can be obtained from anesthesia records.  DISPOSITION:  The patient was taken to recovery room in  stable condition and admitted for appropriate post-op care to include peri-operative antibiotic and DVT prophylaxis with mechanical and pharmacologic measures.  Nathan Gomez 07/21/2016, 1:34 PM

## 2016-07-21 NOTE — Anesthesia Procedure Notes (Signed)
Procedure Name: MAC Date/Time: 07/21/2016 12:00 PM Performed by: Huey Romans ANN Pre-anesthesia Checklist: Patient identified, Emergency Drugs available, Suction available and Patient being monitored Oxygen Delivery Method: Simple face mask Intubation Type: IV induction

## 2016-07-21 NOTE — Anesthesia Postprocedure Evaluation (Signed)
Anesthesia Post Note  Patient: Nathan Gomez  Procedure(s) Performed: Procedure(s) (LRB): RIGHT TOTAL KNEE ARTHROPLASTY (Right)  Patient location during evaluation: PACU Anesthesia Type: Spinal Level of consciousness: oriented and awake and alert Pain management: pain level controlled Vital Signs Assessment: post-procedure vital signs reviewed and stable Respiratory status: spontaneous breathing, respiratory function stable and patient connected to nasal cannula oxygen Cardiovascular status: blood pressure returned to baseline and stable Postop Assessment: no headache and no backache Anesthetic complications: no    Last Vitals:  Vitals:   07/21/16 1406 07/21/16 1407  BP: (!) 107/59   Pulse: 87   Resp: 17   Temp:  36.4 C    Last Pain:  Vitals:   07/21/16 1435  TempSrc:   PainSc: 6                  Zenaida Deed

## 2016-07-21 NOTE — Transfer of Care (Signed)
Immediate Anesthesia Transfer of Care Note  Patient: Nathan Gomez  Procedure(s) Performed: Procedure(s): RIGHT TOTAL KNEE ARTHROPLASTY (Right)  Patient Location: PACU  Anesthesia Type:Spinal  Level of Consciousness: awake, alert  and oriented  Airway & Oxygen Therapy: Patient Spontanous Breathing  Post-op Assessment: Report given to RN and Post -op Vital signs reviewed and stable  Post vital signs: Reviewed and stable  Last Vitals:  Vitals:   07/21/16 1026  BP: (!) 141/71  Pulse: 79  Resp: 18  Temp: 36.7 C    Last Pain:  Vitals:   07/21/16 1026  TempSrc: Oral      Patients Stated Pain Goal: 6 ([phone] [phone])  Complications: No apparent anesthesia complications

## 2016-07-21 NOTE — Anesthesia Procedure Notes (Signed)
Spinal  Staffing Anesthesiologist: Eswin Worrell Performed: anesthesiologist  Preanesthetic Checklist Completed: patient identified, site marked, surgical consent, pre-op evaluation, timeout performed, IV checked, risks and benefits discussed and monitors and equipment checked Spinal Block Patient position: sitting Prep: DuraPrep Patient monitoring: blood pressure, continuous pulse ox, cardiac monitor and heart rate Approach: midline Location: L4-5 Injection technique: single-shot Needle Needle type: Pencan  Needle gauge: 24 G Assessment Sensory level: T6 Additional Notes Functioning IV was confirmed and monitors were applied. Sterile prep and drape, including hand hygiene, mask and sterile gloves were used. The patient was positioned and the spine was prepped. The skin was anesthetized with lidocaine.  Free flow of clear CSF was obtained prior to injecting local anesthetic into the CSF.  The spinal needle aspirated freely following injection.  The needle was carefully withdrawn.  The patient tolerated the procedure well. Consent was obtained prior to procedure with all questions answered and concerns addressed.  [place] Pink, MD

## 2016-07-21 NOTE — Progress Notes (Signed)
Orthopedic Tech Progress Note Patient Details:  Nathan Gomez 1941/09/20 [id number]  CPM Right Knee CPM Right Knee: On Right Knee Flexion (Degrees): 90 Right Knee Extension (Degrees): 0 Additional Comments: Trapeze bar   [place] Pink 07/21/2016, 3:14 PM

## 2016-07-21 NOTE — Progress Notes (Signed)
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  Clinical Impression  Pt admitted with/for TKA.  Pt currently limited functionally due to the problems listed below.  (see problems list.)  Pt will benefit from PT to maximize function and safety to be able to get home safely with available assist of family.  At this time, pt is at min guard to supervision level.     Follow Up Recommendations Home health PT    Equipment Recommendations  None recommended by PT    Recommendations for Other Services       Precautions / Restrictions Precautions Precautions: Fall;Knee Required Braces or Orthoses: Knee Immobilizer - Right Knee Immobilizer - Right: On when out of bed or walking Restrictions Weight Bearing Restrictions: Yes RLE Weight Bearing: Weight bearing as tolerated      Mobility  Bed Mobility Overal bed mobility: Needs Assistance Bed Mobility: Supine to Sit     Supine to sit: Min assist     General bed mobility comments: cues for best technque  Transfers Overall transfer level: Needs assistance Equipment used: Rolling walker (2 wheeled) Transfers: Sit to/from Stand Sit to Stand: Min assist         General transfer comment: cues for hand placement and stability assist only  Ambulation/Gait Ambulation/Gait assistance: Min guard Ambulation Distance (Feet): 130 Feet Assistive device: Rolling walker (2 wheeled) Gait Pattern/deviations: Step-through pattern Gait velocity: slower Gait velocity interpretation: Below normal speed for age/gender General Gait Details: steady with RW  Stairs            Wheelchair Mobility    Modified Rankin (Stroke Patients Only)       Balance Overall balance assessment: No apparent balance  deficits (not formally assessed)                                           Pertinent Vitals/Pain Pain Assessment: 0-10 Pain Score: 5  Pain Location: R knee Pain Descriptors / Indicators: Sore Pain Intervention(s): Monitored during session;Repositioned    Home Living Family/patient expects to be discharged to:: Private residence Living Arrangements: Spouse/significant other Available Help at Discharge: Family;Available 24 hours/day Type of Home: House Home Access: Stairs to enter Entrance Stairs-Rails:  (something to hold) Entrance Stairs-Number of Steps: 2 Home Layout: Two level Home Equipment: Walker - 2 wheels;Cane - single point;Bedside commode      Prior Function Level of Independence: Independent;Independent with assistive device(s) (sometimes used cane)               Hand Dominance        Extremity/Trunk Assessment               Lower Extremity Assessment: RLE deficits/detail RLE Deficits / Details: lifted against gravity, Knee AAROM to 105* flexion, -5*ext       Communication   Communication: No difficulties  Cognition Arousal/Alertness: Awake/alert Behavior During Therapy: WFL for tasks assessed/performed Overall Cognitive Status: Within Functional Limits for tasks assessed                      General Comments      Exercises Total Joint Exercises Ankle Circles/Pumps:  AROM;Other (comment);Both (30) Quad Sets: AROM;Both;10 reps;Supine Gluteal Sets: AROM;Both;10 reps;Supine Heel Slides: AROM;Strengthening;10 reps;Supine Goniometric ROM: 105*      Assessment/Plan    PT Assessment Patient needs continued PT services  PT Diagnosis Acute pain   PT Problem List Decreased strength;Decreased activity tolerance;Decreased mobility;Decreased knowledge of use of DME;Pain  PT Treatment Interventions Gait training;DME instruction;Stair training;Functional mobility training;Therapeutic activities;Therapeutic  exercise;Patient/family education   PT Goals (Current goals can be found in the Care Plan section) Acute Rehab PT Goals Patient Stated Goal: independent PT Goal Formulation: With patient Time For Goal Achievement: 07/28/16 Potential to Achieve Goals: Good    Frequency 7X/week   Barriers to discharge        Co-evaluation               End of Session   Activity Tolerance: Patient tolerated treatment well Patient left: in bed;in CPM;with call bell/phone within reach;with family/visitor present Nurse Communication: Mobility status         Time: 1610-1640 PT Time Calculation (min) (ACUTE ONLY): 30 min   Charges:   PT Evaluation $PT Eval Low Complexity: 1 Procedure PT Treatments $Gait Training: 8-22 mins   PT G Codes:        Marcell Pfeifer, Tessie Fass 07/21/2016, 4:52 PM 07/21/2016  Donnella Sham, PT [phone]  (pager)

## 2016-07-21 NOTE — Anesthesia Preprocedure Evaluation (Signed)
Anesthesia Evaluation  Patient identified by MRN, date of birth, ID band  History of Anesthesia Complications (+) PONV and history of anesthetic complications  Airway Mallampati: I       Dental  (+) Teeth Intact   Pulmonary former smoker,  breath sounds clear to auscultation        Cardiovascular hypertension, Rhythm:Regular Rate:Normal     Neuro/Psych    GI/Hepatic GERD-  ,  Endo/Other  diabetes  Renal/GU      Musculoskeletal  (+) Arthritis -,   Abdominal   Peds  Hematology   Anesthesia Other Findings   Reproductive/Obstetrics                             Anesthesia Physical Anesthesia Plan  ASA: II  Anesthesia Plan: Spinal   Post-op Pain Management:    Induction: Intravenous  Airway Management Planned: Natural Airway  Additional Equipment:   Intra-op Plan:   Post-operative Plan:   Informed Consent: I have reviewed the patients History and Physical, chart, labs and discussed the procedure including the risks, benefits and alternatives for the proposed anesthesia with the patient or authorized representative who has indicated his/her understanding and acceptance.     Plan Discussed with:   Anesthesia Plan Comments:         Anesthesia Quick Evaluation  

## 2016-07-21 NOTE — Interval H&P Note (Signed)
History and Physical Interval Note:  07/21/2016 11:37 AM  Nathan Gomez  has presented today for surgery, with the diagnosis of RIGHT KNEE DEGENERATIVE JOINT DISEASE  The various methods of treatment have been discussed with the patient and family. After consideration of risks, benefits and other options for treatment, the patient has consented to  Procedure(s): RIGHT TOTAL KNEE ARTHROPLASTY (Right) as a surgical intervention .  The patient's history has been reviewed, patient examined, no change in status, stable for surgery.  I have reviewed the patient's chart and labs.  Questions were answered to the patient's satisfaction.     [name]

## 2016-07-22 ENCOUNTER — Encounter (HOSPITAL_COMMUNITY): Payer: Self-pay | Admitting: Orthopaedic Surgery

## 2016-07-22 LAB — GLUCOSE, CAPILLARY
Glucose-Capillary: 182 mg/dL — ABNORMAL HIGH (ref 65–99)
Glucose-Capillary: 188 mg/dL — ABNORMAL HIGH (ref 65–99)
Glucose-Capillary: 181 mg/dL — ABNORMAL HIGH (ref 65–99)
Glucose-Capillary: 179 mg/dL — ABNORMAL HIGH (ref 65–99)

## 2016-07-22 MED ORDER — HYDROCODONE-ACETAMINOPHEN 7.5-325 MG PO TABS
1.0000 | ORAL_TABLET | ORAL | Status: DC | PRN
  Administered 2016-07-22 – 2016-07-23 (×6): 2 via ORAL
  Filled 2016-07-22 (×6): qty 2

## 2016-07-22 MED ORDER — INSULIN ASPART 100 UNIT/ML ~~LOC~~ SOLN
0.0000 [IU] | Freq: Three times a day (TID) | SUBCUTANEOUS | Status: DC
Start: 2016-07-22 — End: 2016-07-23
  Administered 2016-07-22 – 2016-07-23 (×4): 4 [IU] via SUBCUTANEOUS

## 2016-07-22 MED ORDER — METHOCARBAMOL 750 MG PO TABS
750.0000 mg | ORAL_TABLET | Freq: Four times a day (QID) | ORAL | Status: DC | PRN
  Administered 2016-07-23 (×2): 750 mg via ORAL
  Filled 2016-07-22 (×2): qty 1

## 2016-07-22 MED ORDER — METHOCARBAMOL 1000 MG/10ML IJ SOLN
500.0000 mg | Freq: Four times a day (QID) | INTRAVENOUS | Status: DC | PRN
  Filled 2016-07-22: qty 5

## 2016-07-22 NOTE — Progress Notes (Signed)
Orthopedic Tech Progress Note Patient Details:  Nathan Gomez [birth year] [id number]  Patient ID: Nathan Gomez, male   DOB: [birth year], 75 y.o.   MRN: [id number] Applied cpm 0-50  Karolee Stamps 07/22/2016, 6:12 AM

## 2016-07-22 NOTE — Progress Notes (Signed)
Called Dr Jerald Kief office at 1527 then again at 1625 about patients pain and pain medications, no return call the person on the phone at the office said it may take til tomorrow to get the message to the physcican. RN asked that there is not anything set up for the patients who are in the hospital that had surgery. She stated she would let the Medical assist know. Finally RN Nickolas Madrid RN at the office, and she stated she would let Mitzi Hansen know. He returned the call and new orders were received.

## 2016-07-22 NOTE — Progress Notes (Signed)
OT Cancellation Note  Patient Details Name: LORIK BOYEA MRN: [id number] DOB: 1941-11-09   Cancelled Treatment:    Reason Eval/Treat Not Completed: Patient declined, no reason specified;Other (comment) (Pt in CPM, requested OT return tomorrow)   Merri Ray Fenton Candee 07/22/2016, 3:18 PM  Hulda Humphrey OTR/L [phone]

## 2016-07-22 NOTE — NC FL2 (Signed)
[place] LEVEL OF CARE SCREENING TOOL     IDENTIFICATION  Patient Name: Nathan Gomez Birthdate: June 18, 1941 Sex: male Admission Date (Current Location): 07/21/2016  [facility] and [place] Number:  Herbalist and Address:  [facility], [place] [address] [place], [place] [address]      Provider Number: [id number]  Attending Physician Name and Address:  Melrose Nakayama, MD  Relative Name and Phone Number:       Current Level of Care: Hospital Recommended Level of Care: [place] Prior Approval Number:    Date Approved/Denied:   PASRR Number:    Discharge Plan: SNF    Current Diagnoses: Patient Active Problem List   Diagnosis Date Noted  . B12 deficiency 01/30/2016  . Urinary retention 07/30/2015  . Primary osteoarthritis of right knee 07/09/2015  . Personal history of colonic adenomas 01/16/2013  . Pernicious anemia 02/22/2012  . Anemia   . Diabetes mellitus type 2, controlled ([place]) 01/05/2011  . Hyperlipidemia 01/05/2011  . Essential hypertension 01/05/2011  . Osteoarthritis 01/05/2011    Orientation RESPIRATION BLADDER Height & Weight     Self, Time, Situation, Place  O2 Continent Weight: 198 lb 4.8 oz (89.9 kg) Height:  6' (182.9 cm)  BEHAVIORAL SYMPTOMS/MOOD NEUROLOGICAL BOWEL NUTRITION STATUS      Continent    AMBULATORY STATUS COMMUNICATION OF NEEDS Skin   Extensive Assist Verbally Normal                       Personal Care Assistance Level of Assistance  Bathing, Dressing Bathing Assistance: Maximum assistance   Dressing Assistance: Maximum assistance     Functional Limitations Info             SPECIAL CARE FACTORS FREQUENCY                       Contractures      Additional Factors Info   (fULL)               Current Medications (07/22/2016):  This is the current hospital active medication list Current Facility-Administered Medications  Medication Dose Route  Frequency Provider Last Rate Last Dose  . acetaminophen (TYLENOL) tablet 650 mg  650 mg Oral Q6H PRN Loni Dolly, PA-C   650 mg at 07/22/16 0216   Or  . acetaminophen (TYLENOL) suppository 650 mg  650 mg Rectal Q6H PRN Loni Dolly, PA-C      . alum & mag hydroxide-simeth (MAALOX/MYLANTA) 200-200-20 MG/5ML suspension 30 mL  30 mL Oral Q4H PRN Loni Dolly, PA-C      . amLODipine (NORVASC) tablet 10 mg  10 mg Oral Daily Loni Dolly, PA-C   10 mg at 07/22/16 0805  . aspirin EC tablet 325 mg  325 mg Oral BID PC Loni Dolly, PA-C   325 mg at 07/22/16 0805  . atorvastatin (LIPITOR) tablet 20 mg  20 mg Oral Q2200 Loni Dolly, PA-C   20 mg at 07/21/16 2200  . bisacodyl (DULCOLAX) EC tablet 5 mg  5 mg Oral Daily PRN Loni Dolly, PA-C      . diphenhydrAMINE (BENADRYL) 12.5 MG/5ML elixir 12.5-25 mg  12.5-25 mg Oral Q4H PRN Loni Dolly, PA-C      . docusate sodium (COLACE) capsule 100 mg  100 mg Oral BID Loni Dolly, PA-C   100 mg at 07/22/16 0807  . fluticasone (FLONASE) 50 MCG/ACT nasal spray 1 spray  1  spray Each Nare Daily Loni Dolly, PA-C   1 spray at 07/22/16 [phone]  . hydrochlorothiazide (HYDRODIURIL) tablet 25 mg  25 mg Oral q morning - 10a Loni Dolly, PA-C   25 mg at 07/22/16 0806  . HYDROcodone-acetaminophen (NORCO) 7.5-325 MG per tablet 1-2 tablet  1-2 tablet Oral Q3H PRN Loni Dolly, PA-C      . HYDROmorphone (DILAUDID) injection 0.5-1 mg  0.5-1 mg Intravenous Q3H PRN Loni Dolly, PA-C   1 mg at 07/22/16 1609  . insulin aspart (novoLOG) injection 0-20 Units  0-20 Units Subcutaneous TID WC Loni Dolly, PA-C   4 Units at 07/22/16 1257  . lactated ringers infusion   Intravenous Continuous Loni Dolly, PA-C      . lisinopril (PRINIVIL,ZESTRIL) tablet 20 mg  20 mg Oral Daily Loni Dolly, PA-C   20 mg at 07/22/16 [id number]  . loratadine (CLARITIN) tablet 10 mg  10 mg Oral Daily PRN Loni Dolly, PA-C   10 mg at 07/22/16 0352  . menthol-cetylpyridinium (CEPACOL) lozenge 3 mg  1 lozenge Oral PRN Loni Dolly,  PA-C       Or  . phenol (CHLORASEPTIC) mouth spray 1 spray  1 spray Mouth/Throat PRN Loni Dolly, PA-C      . metFORMIN (GLUCOPHAGE-XR) 24 hr tablet 1,000 mg  1,000 mg Oral BID WC Loni Dolly, PA-C   1,000 mg at 07/22/16 0807  . methocarbamol (ROBAXIN) tablet 750 mg  750 mg Oral Q6H PRN Loni Dolly, PA-C       Or  . methocarbamol (ROBAXIN) 500 mg in dextrose 5 % 50 mL IVPB  500 mg Intravenous Q6H PRN Loni Dolly, PA-C      . metoCLOPramide (REGLAN) tablet 5-10 mg  5-10 mg Oral Q8H PRN Loni Dolly, PA-C       Or  . metoCLOPramide (REGLAN) injection 5-10 mg  5-10 mg Intravenous Q8H PRN Loni Dolly, PA-C      . niacin (SLO-NIACIN) CR tablet   Oral QHS Loni Dolly, PA-C      . ondansetron [facility] tablet 4 mg  4 mg Oral Q6H PRN Loni Dolly, PA-C       Or  . ondansetron [facility]) injection 4 mg  4 mg Intravenous Q6H PRN Loni Dolly, PA-C      . pantoprazole (PROTONIX) EC tablet 40 mg  40 mg Oral Daily Loni Dolly, PA-C   40 mg at 07/22/16 0806  . tamsulosin (FLOMAX) capsule 0.8 mg  0.8 mg Oral QHS Loni Dolly, PA-C   0.8 mg at 07/21/16 2200  . tranexamic acid (CYKLOKAPRON) 1,000 mg in sodium chloride 0.9 % 100 mL IVPB  1,000 mg Intravenous Once Loni Dolly, PA-C         Discharge Medications: Please see discharge summary for a list of discharge medications.  Relevant Imaging Results:  Relevant Lab Results:   Additional Information    Venetia Maxon, [place] R

## 2016-07-22 NOTE — Progress Notes (Signed)
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    PT Comments    Pt follows all cues well, but indicates increased pain as compared to yesterday.  Pt would benefit from continued therapies at a SNF level to maximize independence and decrease overall burden of care.  Will continue to follow.    Follow Up Recommendations  SNF     Equipment Recommendations  None recommended by PT    Recommendations for Other Services       Precautions / Restrictions Precautions Precautions: Fall;Knee Precaution Comments: pt ed on positioning of knee. Required Braces or Orthoses: Knee Immobilizer - Right Knee Immobilizer - Right: Discontinue once straight leg raise with < 10 degree lag Restrictions Weight Bearing Restrictions: Yes RLE Weight Bearing: Weight bearing as tolerated    Mobility  Bed Mobility Overal bed mobility: Needs Assistance Bed Mobility: Supine to Sit     Supine to sit: Min guard;HOB elevated     General bed mobility comments: pt with heavy use of UEs on bed rails to A with coming to sitting.  No cues needed.    Transfers Overall transfer level: Needs assistance Equipment used: Rolling walker (2 wheeled) Transfers: Sit to/from Stand Sit to Stand: Min assist         General transfer comment: cues for UE use and A for balance only.    Ambulation/Gait Ambulation/Gait assistance: Min guard Ambulation Distance (Feet): 80 Feet Assistive device: Rolling walker (2 wheeled) Gait Pattern/deviations: Step-to pattern;Step-through pattern;Decreased step length - left;Decreased stance time - right;Decreased stride length;Trunk flexed     General Gait Details: pt attempts step-through gait pattern, but is unable to maintain throughout  ambulation.  pt indicates increased pain today limiting distance today.     Stairs            Wheelchair Mobility    Modified Rankin (Stroke Patients Only)       Balance Overall balance assessment: Needs assistance Sitting-balance support: No upper extremity supported;Feet supported Sitting balance-Leahy Scale: Good     Standing balance support: Single extremity supported;Bilateral upper extremity supported;During functional activity Standing balance-Leahy Scale: Poor                      Cognition Arousal/Alertness: Awake/alert Behavior During Therapy: WFL for tasks assessed/performed Overall Cognitive Status: Within Functional Limits for tasks assessed                      Exercises Total Joint Exercises Ankle Circles/Pumps: AROM;Both;10 reps Quad Sets: AROM;Both;10 reps Long Arc Quad: AAROM;Right;10 reps Knee Flexion: AAROM;Right;10 reps Goniometric ROM: AAROM 5 - 45    General Comments        Pertinent Vitals/Pain Pain Assessment: 0-10 Pain Score: 7  Pain Location: R knee Pain Descriptors / Indicators: Grimacing;Guarding;Burning Pain Intervention(s): Monitored during session;Premedicated before session;Repositioned    Home Living                      Prior Function            PT Goals (current goals can now be found in the care plan section) Acute Rehab PT Goals Patient Stated Goal: independent PT Goal Formulation: With patient Time For Goal Achievement: 07/28/16  Potential to Achieve Goals: Good Progress towards PT goals: Progressing toward goals    Frequency  7X/week    PT Plan Discharge plan needs to be updated    Co-evaluation             End of Session Equipment Utilized During Treatment: Gait belt;Right knee immobilizer Activity Tolerance: Patient tolerated treatment well Patient left: in chair;with call bell/phone within reach;with family/visitor present     Time: [id number] PT Time Calculation  (min) (ACUTE ONLY): 35 min  Charges:  $Gait Training: 8-22 mins $Therapeutic Exercise: 8-22 mins                    G CodesCatarina Hartshorn, Rhea 07/22/2016, 11:23 AM

## 2016-07-22 NOTE — Progress Notes (Signed)
Subjective: 1 Day Post-Op Procedure(s) (LRB): RIGHT TOTAL KNEE ARTHROPLASTY (Right)  Activity level:  wbat Diet tolerance:  ok Voiding:  Foley out this morning Patient reports pain as mild and moderate.    Objective: Vital signs in last 24 hours: Temp:  [97.5 F (36.4 C)-98.1 F (36.7 C)] 98 F (36.7 C) (09/06 0539) Pulse Rate:  [72-87] 81 (09/06 0539) Resp:  [14-18] 15 (09/06 0539) BP: (95-141)/(59-74) 126/63 (09/06 0539) SpO2:  [90 %-98 %] 98 % (09/06 0539) Weight:  [89.9 kg (198 lb 4.8 oz)] 89.9 kg (198 lb 4.8 oz) (09/05 1026)  Labs: No results for input(s): HGB in the last 72 hours. No results for input(s): WBC, RBC, HCT, PLT in the last 72 hours. No results for input(s): NA, K, CL, CO2, BUN, CREATININE, GLUCOSE, CALCIUM in the last 72 hours. No results for input(s): LABPT, INR in the last 72 hours.  Physical Exam:  Neurologically intact ABD soft Neurovascular intact Sensation intact distally Intact pulses distally Dorsiflexion/Plantar flexion intact Incision: dressing C/D/I and no drainage No cellulitis present Compartment soft  Assessment/Plan:  1 Day Post-Op Procedure(s) (LRB): RIGHT TOTAL KNEE ARTHROPLASTY (Right) Advance diet Up with therapy D/C IV fluids Plan for discharge tomorrow Discharge to SNF tomorrow if doing well and cleared by PT. Follow up in office 2 weeks post op. Continue on 325mg  ASA BID x 2 weeks.   Nathan Gomez, Nathan Gomez 07/22/2016, 7:33 AM

## 2016-07-23 DIAGNOSIS — R011 Cardiac murmur, unspecified: Secondary | ICD-10-CM | POA: Diagnosis not present

## 2016-07-23 DIAGNOSIS — M722 Plantar fascial fibromatosis: Secondary | ICD-10-CM | POA: Diagnosis not present

## 2016-07-23 DIAGNOSIS — E785 Hyperlipidemia, unspecified: Secondary | ICD-10-CM | POA: Diagnosis not present

## 2016-07-23 DIAGNOSIS — M1711 Unilateral primary osteoarthritis, right knee: Secondary | ICD-10-CM | POA: Diagnosis not present

## 2016-07-23 DIAGNOSIS — R339 Retention of urine, unspecified: Secondary | ICD-10-CM | POA: Diagnosis not present

## 2016-07-23 DIAGNOSIS — E538 Deficiency of other specified B group vitamins: Secondary | ICD-10-CM | POA: Diagnosis not present

## 2016-07-23 DIAGNOSIS — D510 Vitamin B12 deficiency anemia due to intrinsic factor deficiency: Secondary | ICD-10-CM | POA: Diagnosis not present

## 2016-07-23 DIAGNOSIS — R262 Difficulty in walking, not elsewhere classified: Secondary | ICD-10-CM | POA: Diagnosis not present

## 2016-07-23 DIAGNOSIS — Z96651 Presence of right artificial knee joint: Secondary | ICD-10-CM | POA: Diagnosis not present

## 2016-07-23 DIAGNOSIS — Z471 Aftercare following joint replacement surgery: Secondary | ICD-10-CM | POA: Diagnosis not present

## 2016-07-23 DIAGNOSIS — K219 Gastro-esophageal reflux disease without esophagitis: Secondary | ICD-10-CM | POA: Diagnosis not present

## 2016-07-23 DIAGNOSIS — G894 Chronic pain syndrome: Secondary | ICD-10-CM | POA: Diagnosis not present

## 2016-07-23 DIAGNOSIS — I10 Essential (primary) hypertension: Secondary | ICD-10-CM | POA: Diagnosis not present

## 2016-07-23 DIAGNOSIS — M25569 Pain in unspecified knee: Secondary | ICD-10-CM | POA: Diagnosis not present

## 2016-07-23 DIAGNOSIS — E119 Type 2 diabetes mellitus without complications: Secondary | ICD-10-CM | POA: Diagnosis not present

## 2016-07-23 DIAGNOSIS — Z86010 Personal history of colonic polyps: Secondary | ICD-10-CM | POA: Diagnosis not present

## 2016-07-23 LAB — GLUCOSE, CAPILLARY
Glucose-Capillary: 162 mg/dL — ABNORMAL HIGH (ref 65–99)
Glucose-Capillary: 199 mg/dL — ABNORMAL HIGH (ref 65–99)

## 2016-07-23 MED ORDER — HYDROCODONE-ACETAMINOPHEN 10-325 MG PO TABS: 1.0000 | ORAL_TABLET | ORAL | 0 refills | Status: DC | PRN

## 2016-07-23 MED ORDER — ASPIRIN 325 MG PO TBEC: 325.0000 mg | DELAYED_RELEASE_TABLET | Freq: Two times a day (BID) | ORAL | 0 refills | Status: DC

## 2016-07-23 MED ORDER — METHOCARBAMOL 750 MG PO TABS: 750.0000 mg | ORAL_TABLET | Freq: Four times a day (QID) | ORAL | 0 refills | Status: DC | PRN

## 2016-07-23 NOTE — Progress Notes (Signed)
Orthopedic Tech Progress Note Patient Details:  Nathan Gomez 20-Nov-1940 [id number]  Patient ID: [name], male   DOB: [birth year], 75 y.o.   MRN: [id number] Applied cpm 0-50  Karolee Stamps 07/23/2016, 6:29 AM

## 2016-07-23 NOTE — Progress Notes (Signed)
OT Cancellation Note  Patient Details Name: Nathan Gomez MRN: [id number] DOB: 11-Jan-1941   Cancelled Treatment:    Reason Eval/Treat Not Completed:  Pt with plans to d/c to SNF for ST rehab. Will defer OT to SNF.  [name] 07/23/2016, 2:38 PM  [phone]

## 2016-07-23 NOTE — Progress Notes (Signed)
Pt d/c'ed to [place] today. Transported by [facility]. Report called to Joshua at 1531. Wife collected patient's belongings. IV removed with no complications. VSS. Discharge teaching and medication education given and all questions answered.   Jaymes Graff, RN

## 2016-07-23 NOTE — Discharge Summary (Signed)
Patient ID: SHIVEN JUNIOUS MRN: [id number] DOB/AGE: 1941-05-09 75 y.o.  Admit date: 07/21/2016 Discharge date: 07/23/2016  Admission Diagnoses:  Principal Problem:   Primary osteoarthritis of right knee Active Problems:   Diabetes mellitus type 2, controlled [facility])   Discharge Diagnoses:  Same  Past Medical History:  Diagnosis Date  . Achilles tendinitis   . Complication of anesthesia    urinary retention  . 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 Diabetes mellitus, type 2 ([place])   . Dyslipidemia   . GERD (gastroesophageal reflux disease)   . Heart murmur    child  . Hypertension   . Osteoarthritis    right knee  . Personal history of colonic adenomas 01/16/2013  . Plantar fasciitis     Surgeries: Procedure(s): RIGHT TOTAL KNEE ARTHROPLASTY on 07/21/2016   Consultants:   Discharged Condition: Improved  Hospital Course: MANCE VALLEJO is an 75 y.o. male who was admitted 07/21/2016 for operative treatment ofPrimary osteoarthritis of right knee. Patient has severe unremitting pain that affects sleep, daily activities, and work/hobbies. After pre-op clearance the patient was taken to the operating room on 07/21/2016 and underwent  Procedure(s): RIGHT TOTAL KNEE ARTHROPLASTY.    Patient was given perioperative antibiotics: Anti-infectives    Start     Dose/Rate Route Frequency Ordered Stop   07/21/16 1730  ceFAZolin (ANCEF) IVPB 2g/100 mL premix     2 g 200 mL/hr over 30 Minutes Intravenous Every 6 hours 07/21/16 1519 07/22/16 0529   07/21/16 1042  ceFAZolin (ANCEF) IVPB 2g/100 mL premix     2 g 200 mL/hr over 30 Minutes Intravenous On call to O.R. 07/21/16 1042 07/21/16 1208       Patient was given sequential compression devices, early ambulation, and chemoprophylaxis to prevent DVT.  Patient benefited maximally from hospital stay and there were no complications.    Recent vital signs: Patient Vitals for the past 24 hrs:  BP Temp Temp src Pulse Resp SpO2   07/23/16 0639 (!) 114/59 98.2 F (36.8 C) Oral 81 16 94 %  07/22/16 2146 129/63 98.6 F (37 C) Oral 85 16 95 %     Recent laboratory studies: No results for input(s): WBC, HGB, HCT, PLT, NA, K, CL, CO2, BUN, CREATININE, GLUCOSE, INR, CALCIUM in the last 72 hours.  Invalid input(s): PT, 2   Discharge Medications:     Medication List    STOP taking these medications   celecoxib 200 MG capsule Commonly known as:  CELEBREX     TAKE these medications   amLODipine 10 MG tablet Commonly known as:  NORVASC Take 1 tablet (10 mg total) by mouth daily.   aspirin 325 MG EC tablet Take 1 tablet (325 mg total) by mouth 2 (two) times daily.   atorvastatin 40 MG tablet Commonly known as:  LIPITOR Take 0.5 tablets (20 mg total) by mouth daily.   blood glucose meter kit and supplies Kit Use to test blood sugar up to twice a day. DX E11.09   GLUCOSAMINE SULFATE PO Take 1 tablet by mouth daily.   glucose blood test strip Use as instructed to test blood sugar up to twice a day. DX:  E11.09   hydrochlorothiazide 25 MG tablet Commonly known as:  HYDRODIURIL Take 1 tablet (25 mg total) by mouth every morning.   HYDROcodone-acetaminophen 10-325 MG tablet Commonly known as:  NORCO Take 1-2 tablets by mouth every 4 (four) hours as needed.   Krill Oil 300 MG Caps Take  500 mg by mouth daily.   Lancets 22E Misc Delica Fine Lancets. Use to test blood sugar up to twice a day. DX E11.09   lisinopril 20 MG tablet Commonly known as:  PRINIVIL,ZESTRIL Take 1 tablet (20 mg total) by mouth daily.   loratadine 10 MG tablet Commonly known as:  CLARITIN Take 1 tablet (10 mg total) by mouth daily. What changed:  when to take this  reasons to take this   magnesium oxide 400 MG tablet Commonly known as:  MAG-OX Take 1 tablet (400 mg total) by mouth 2 (two) times daily.   metFORMIN 500 MG 24 hr tablet Commonly known as:  GLUCOPHAGE-XR TAKE 2 TABLETS (1,000 MG TOTAL) BY MOUTH 2 (TWO)  TIMES DAILY.   methocarbamol 750 MG tablet Commonly known as:  ROBAXIN Take 1 tablet (750 mg total) by mouth every 6 (six) hours as needed for muscle spasms.   NASACORT AQ NA Place 1 spray into the nose daily as needed (allergies).   NIACIN FLUSH FREE 500 MG Caps Generic drug:  Inositol Niacinate Take 1 capsule by mouth at bedtime.   omeprazole 20 MG capsule Commonly known as:  PRILOSEC Take 1 capsule (20 mg total) by mouth at bedtime.   PROBIOTIC PO Take 1 capsule by mouth daily.   SENIOR MULTIVITAMIN PLUS Tabs Take 1 tablet by mouth daily.   SOLUBLE FIBER/PROBIOTICS Chew Chew 2 tablets by mouth daily.   tamsulosin 0.4 MG Caps capsule Commonly known as:  FLOMAX Take 1 capsule (0.4 mg total) by mouth at bedtime. What changed:  how much to take  when to take this  additional instructions   VITAMIN B12 PO Take 1 tablet by mouth daily.   Vitamin D 2000 units Caps Take 1 capsule (2,000 Units total) by mouth every morning.       Diagnostic Studies: Dg Chest 2 View  Result Date: 07/10/2016 CLINICAL DATA:  Preop total knee replacement. EXAM: CHEST  2 VIEW COMPARISON:  06/28/2015 FINDINGS: The heart size and mediastinal contours are within normal limits. Both lungs are clear. The visualized skeletal structures are unremarkable. IMPRESSION: No active cardiopulmonary disease. Electronically Signed   By: Rolm Baptise M.D.   On: 07/10/2016 10:58    Disposition: 01-Home or Self Care  Discharge Instructions    Call MD / Call 911    Complete by:  As directed   If you experience chest pain or shortness of breath, CALL 911 and be transported to the hospital emergency room.  If you develope a fever above 101 F, pus (white drainage) or increased drainage or redness at the wound, or calf pain, call your surgeon's office.   Constipation Prevention    Complete by:  As directed   Drink plenty of fluids.  Prune juice may be helpful.  You may use a stool softener, such as Colace (over  the counter) 100 mg twice a day.  Use MiraLax (over the counter) for constipation as needed.   Diet - low sodium heart healthy    Complete by:  As directed   Discharge instructions    Complete by:  As directed   INSTRUCTIONS AFTER JOINT REPLACEMENT   Remove items at home which could result in a fall. This includes throw rugs or furniture in walking pathways ICE to the affected joint every three hours while awake for 30 minutes at a time, for at least the first 3-5 days, and then as needed for pain and swelling.  Continue to use ice for  pain and swelling. You may notice swelling that will progress down to the foot and ankle.  This is normal after surgery.  Elevate your leg when you are not up walking on it.   Continue to use the breathing machine you got in the hospital (incentive spirometer) which will help keep your temperature down.  It is common for your temperature to cycle up and down following surgery, especially at night when you are not up moving around and exerting yourself.  The breathing machine keeps your lungs expanded and your temperature down.   DIET:  As you were doing prior to hospitalization, we recommend a well-balanced diet.  DRESSING / WOUND CARE / SHOWERING  You may shower 3 days after surgery, but keep the wounds dry during showering.  You may use an occlusive plastic wrap (Press'n Seal for example), NO SOAKING/SUBMERGING IN THE BATHTUB.  If the bandage gets wet, change with a clean dry gauze.  If the incision gets wet, pat the wound dry with a clean towel.  ACTIVITY  Increase activity slowly as tolerated, but follow the weight bearing instructions below.   No driving for 6 weeks or until further direction given by your physician.  You cannot drive while taking narcotics.  No lifting or carrying greater than 10 lbs. until further directed by your surgeon. Avoid periods of inactivity such as sitting longer than an hour when not asleep. This helps prevent blood clots.  You  may return to work once you are authorized by your doctor.     WEIGHT BEARING   Weight bearing as tolerated with assist device (walker, cane, etc) as directed, use it as long as suggested by your surgeon or therapist, typically at least 4-6 weeks.   EXERCISES  Results after joint replacement surgery are often greatly improved when you follow the exercise, range of motion and muscle strengthening exercises prescribed by your doctor. Safety measures are also important to protect the joint from further injury. Any time any of these exercises cause you to have increased pain or swelling, decrease what you are doing until you are comfortable again and then slowly increase them. If you have problems or questions, call your caregiver or physical therapist for advice.   Rehabilitation is important following a joint replacement. After just a few days of immobilization, the muscles of the leg can become weakened and shrink (atrophy).  These exercises are designed to build up the tone and strength of the thigh and leg muscles and to improve motion. Often times heat used for twenty to thirty minutes before working out will loosen up your tissues and help with improving the range of motion but do not use heat for the first two weeks following surgery (sometimes heat can increase post-operative swelling).   These exercises can be done on a training (exercise) mat, on the floor, on a table or on a bed. Use whatever works the best and is most comfortable for you.    Use music or television while you are exercising so that the exercises are a pleasant break in your day. This will make your life better with the exercises acting as a break in your routine that you can look forward to.   Perform all exercises about fifteen times, three times per day or as directed.  You should exercise both the operative leg and the other leg as well.   Exercises include:   Quad Sets - Tighten up the muscle on the front of the thigh  [employer])  and hold for 5-10 seconds.   Straight Leg Raises - With your knee straight (if you were given a brace, keep it on), lift the leg to 60 degrees, hold for 3 seconds, and slowly lower the leg.  Perform this exercise against resistance later as your leg gets stronger.  Leg Slides: Lying on your back, slowly slide your foot toward your buttocks, bending your knee up off the floor (only go as far as is comfortable). Then slowly slide your foot back down until your leg is flat on the floor again.  Angel Wings: Lying on your back spread your legs to the side as far apart as you can without causing discomfort.  Hamstring Strength:  Lying on your back, push your heel against the floor with your leg straight by tightening up the muscles of your buttocks.  Repeat, but this time bend your knee to a comfortable angle, and push your heel against the floor.  You may put a pillow under the heel to make it more comfortable if necessary.   A rehabilitation program following joint replacement surgery can speed recovery and prevent re-injury in the future due to weakened muscles. Contact your doctor or a physical therapist for more information on knee rehabilitation.    CONSTIPATION  Constipation is defined medically as fewer than three stools per week and severe constipation as less than one stool per week.  Even if you have a regular bowel pattern at home, your normal regimen is likely to be disrupted due to multiple reasons following surgery.  Combination of anesthesia, postoperative narcotics, change in appetite and fluid intake all can affect your bowels.   YOU MUST use at least one of the following options; they are listed in order of increasing strength to get the job done.  They are all available over the counter, and you may need to use some, POSSIBLY even all of these options:    Drink plenty of fluids (prune juice may be helpful) and high fiber foods Colace 100 mg by mouth twice a day  Senokot for  constipation as directed and as needed Dulcolax (bisacodyl), take with full glass of water  Miralax (polyethylene glycol) once or twice a day as needed.  If you have tried all these things and are unable to have a bowel movement in the first 3-4 days after surgery call either your surgeon or your primary doctor.    If you experience loose stools or diarrhea, hold the medications until you stool forms back up.  If your symptoms do not get better within 1 week or if they get worse, check with your doctor.  If you experience "the worst abdominal pain ever" or develop nausea or vomiting, please contact the office immediately for further recommendations for treatment.   ITCHING:  If you experience itching with your medications, try taking only a single pain pill, or even half a pain pill at a time.  You can also use Benadryl over the counter for itching or also to help with sleep.   TED HOSE STOCKINGS:  Use stockings on both legs until for at least 2 weeks or as directed by physician office. They may be removed at night for sleeping.  MEDICATIONS:  See your medication summary on the "After Visit Summary" that nursing will review with you.  You may have some home medications which will be placed on hold until you complete the course of blood thinner medication.  It is important for you to complete the blood thinner  medication as prescribed.  PRECAUTIONS:  If you experience chest pain or shortness of breath - call 911 immediately for transfer to the hospital emergency department.   If you develop a fever greater that 101 F, purulent drainage from wound, increased redness or drainage from wound, foul odor from the wound/dressing, or calf pain - CONTACT YOUR SURGEON.                                                   FOLLOW-UP APPOINTMENTS:  If you do not already have a post-op appointment, please call the office for an appointment to be seen by your surgeon.  Guidelines for how soon to be seen are listed in  your "After Visit Summary", but are typically between 1-4 weeks after surgery.  OTHER INSTRUCTIONS:   Knee Replacement:  Do not place pillow under knee, focus on keeping the knee straight while resting. CPM instructions: 0-90 degrees, 2 hours in the morning, 2 hours in the afternoon, and 2 hours in the evening. Place foam block, curve side up under heel at all times except when in CPM or when walking.  DO NOT modify, tear, cut, or change the foam block in any way.  MAKE SURE YOU:  Understand these instructions.  Get help right away if you are not doing well or get worse.    Thank you for letting us be a part of your medical care team.  It is a privilege we respect greatly.  We hope these instructions will help you stay on track for a fast and full recovery!   Increase activity slowly as tolerated    Complete by:  As directed      Follow-up Information    DALLDORF,PETER G, MD. Schedule an appointment as soon as possible for a visit in 2 weeks.   Specialty:  Orthopedic Surgery Contact information: [address] [place] [address] [phone]            Signed: Rich Fuchs 07/23/2016, 3:08 PM

## 2016-07-23 NOTE — Progress Notes (Signed)
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    PT Comments    Pt performed increased mobility and set for d/c to snf today.  Pt remains to require min guard assist for all mobility as he remains unsteady.    Follow Up Recommendations  SNF     Equipment Recommendations  None recommended by PT    Recommendations for Other Services       Precautions / Restrictions Precautions Precautions: Fall;Knee Precaution Comments: pt ed on positioning of knee. Required Braces or Orthoses: Knee Immobilizer - Right Knee Immobilizer - Right: Discontinue once straight leg raise with < 10 degree lag Restrictions Weight Bearing Restrictions: Yes RLE Weight Bearing: Weight bearing as tolerated    Mobility  Bed Mobility Overal bed mobility: Needs Assistance Bed Mobility: Supine to Sit     Supine to sit: Min guard;HOB elevated     General bed mobility comments: pt with heavy use of UEs on bed rails to A with coming to sitting.  No cues needed.    Transfers Overall transfer level: Needs assistance Equipment used: Rolling walker (2 wheeled) Transfers: Sit to/from Stand Sit to Stand: Min guard         General transfer comment: cues for UE use and A for balance only.    Ambulation/Gait Ambulation/Gait assistance: Min guard Ambulation Distance (Feet): 160 Feet Assistive device: Rolling walker (2 wheeled) Gait Pattern/deviations: Step-to pattern;Step-through pattern;Trunk flexed;Decreased stance time - right;Decreased stride length;Antalgic Gait velocity: slower Gait velocity interpretation: Below normal speed for age/gender General Gait Details: Cues for sequencing, R knee flexion and extension, Upper trunk control, and relaxing B shoulders.      Stairs            Wheelchair Mobility    Modified Rankin (Stroke Patients Only)       Balance     Sitting balance-Leahy Scale: Good       Standing balance-Leahy Scale: Fair                      Cognition Arousal/Alertness: Awake/alert Behavior During Therapy: WFL for tasks assessed/performed Overall Cognitive Status: Within Functional Limits for tasks assessed                      Exercises Total Joint Exercises Ankle Circles/Pumps: AROM;Both;10 reps;Supine Quad Sets: AROM;10 reps;Supine;Right Short Arc Quad: AROM;Right;10 reps;Supine Heel Slides: Right;10 reps;Supine;AAROM Hip ABduction/ADduction: AROM;Right;10 reps;Supine Straight Leg Raises: AROM;Right;10 reps;Supine Goniometric ROM: 5-63 degrees.      General Comments        Pertinent Vitals/Pain Pain Assessment: 0-10 Pain Score: 8  Pain Location: R knee Pain Descriptors / Indicators: Grimacing;Guarding Pain Intervention(s): Monitored during session;Repositioned    Home Living                      Prior Function            PT Goals (current goals can now be found in the care plan section) Acute Rehab PT Goals Patient Stated Goal: independent Potential to Achieve Goals: Good Progress towards PT goals: Progressing toward goals    Frequency  7X/week    PT Plan Current plan remains appropriate    Co-evaluation  End of Session Equipment Utilized During Treatment: Gait belt (did not use knee immobilizer as patient able to perform 10 SLRs.  ) Activity Tolerance: Patient tolerated treatment well Patient left: in chair;with call bell/phone within reach;with family/visitor present     Time: 1351-1420 PT Time Calculation (min) (ACUTE ONLY): 29 min  Charges:  $Gait Training: 8-22 mins $Therapeutic Exercise: 8-22 mins                    G Codes:      Cristela Blue 2016-07-28, 2:29 PM Governor Rooks, PTA pager [phone]

## 2016-07-23 NOTE — Plan of Care (Signed)
Problem: Pain Managment: Goal: General experience of comfort will improve Outcome: Progressing Medicated frequently for pain  Problem: Bowel/Gastric: Goal: Will not experience complications related to bowel motility Outcome: Progressing No bowel issues

## 2016-07-23 NOTE — Clinical Social Work Placement (Signed)
   CLINICAL SOCIAL WORK PLACEMENT  NOTE  Date:  07/23/2016  Patient Details  Name: Nathan Gomez MRN: [id number] Date of Birth: 25-Apr-1941  Clinical Social Work is seeking post-discharge placement for this patient at the [place] level of care (*CSW will initial, date and re-position this form in  chart as items are completed):      Patient/family provided with [facility] Work Department's list of facilities offering this level of care within the geographic area requested by the patient (or if unable, by the patient's family).  Yes   Patient/family informed of their freedom to choose among providers that offer the needed level of care, that participate in Medicare, Medicaid or managed care program needed by the patient, have an available bed and are willing to accept the patient.  Yes   Patient/family informed of [facility]'s ownership interest in [facility] and [facility], as well as of the fact that they are under no obligation to receive care at these facilities.  PASRR submitted to EDS on       PASRR number received on       Existing PASRR number confirmed on       FL2 transmitted to all facilities in geographic area requested by pt/family on       FL2 transmitted to all facilities within larger geographic area on       Patient informed that his/her managed care company has contracts with or will negotiate with certain facilities, including the following:        Yes   Patient/family informed of bed offers received.  Patient [facility] bed at  [facility])     Physician recommends and patient chooses bed at      Patient to be transferred to  [facility]) on 07/23/16.  Patient to be transferred to facility by  [facility])     Patient family notified on   of transfer.  Name of family member notified:        PHYSICIAN       Additional Comment:    _______________________________________________ Bernita Buffy 07/23/2016, 1:57  PM

## 2016-07-23 NOTE — Plan of Care (Signed)
Problem: Nutrition: Goal: Adequate nutrition will be maintained Outcome: Progressing Pt eating 50-100% of his meal trays

## 2016-07-23 NOTE — Progress Notes (Signed)
SW obtained PASRR for pt : [id number]   Tilda Burrow, MSW [phone]

## 2016-07-23 NOTE — Clinical Social Work Note (Signed)
Clinical Social Work Assessment  Patient Details  Name: Nathan Gomez MRN: [id number] Date of Birth: [birth year]  Date of referral:  07/23/16               Reason for consult:  Facility Placement                Permission sought to share information with:   (fACILITY) Permission granted to share information::   Acupuncturist)  Name::        Agency::     Relationship::     Contact Information:     Housing/Transportation Living arrangements for the past 2 months:  Single Family Home (From home with wife) Source of Information:  Patient Patient Interpreter Needed:  None Criminal Activity/Legal Involvement Pertinent to Current Situation/Hospitalization:  No - Comment as needed Significant Relationships:  Spouse Lives with:  Spouse Do you feel safe going back to the place where you live?   (fACILITY) Need for family participation in patient care:  Yes (Comment)  Care giving concerns:  Needs assistance with ADLs.   Social Worker assessment / plan:  SW met with patient at bedside. Patient was alert and oriented. There was no family present. Patient confirms that he would like to go to a SNF. Patient has been referred out. Patient wants to go to [facility].  Employment status:  Retired Forensic scientist:  Medicare PT Recommendations:  [place] / Referral to community resources:   (snf)  Patient/Family's Response to care:  Appropriate.  Patient/Family's Understanding of and Emotional Response to Diagnosis, Current Treatment, and Prognosis:  No questions for SW.  Emotional Assessment Appearance:  Appears stated age Attitude/Demeanor/Rapport:   (Appropriate) Affect (typically observed):  Accepting Orientation:  Oriented to Self, Oriented to Place, Oriented to  Time, Oriented to Situation Alcohol / Substance use:  Not Applicable Psych involvement (Current and /or in the community):  No (Comment)  Discharge Needs  Concerns to be addressed:  No discharge  needs identified Readmission within the last 30 days:  No Current discharge risk:  None Barriers to Discharge:  No Barriers Identified   Venetia Maxon, Jariah Tarkowski R 07/23/2016, 2:00 PM

## 2016-07-23 NOTE — Plan of Care (Signed)
Problem: Tissue Perfusion: Goal: Risk factors for ineffective tissue perfusion will decrease Outcome: Progressing Pt up in chair today, walking with PT  Problem: Activity: Goal: Risk for activity intolerance will decrease Outcome: Progressing Pt up in chair today, walking with PT

## 2016-07-24 ENCOUNTER — Telehealth: Payer: Self-pay | Admitting: *Deleted

## 2016-07-24 DIAGNOSIS — Z471 Aftercare following joint replacement surgery: Secondary | ICD-10-CM | POA: Diagnosis not present

## 2016-07-24 DIAGNOSIS — R262 Difficulty in walking, not elsewhere classified: Secondary | ICD-10-CM | POA: Diagnosis not present

## 2016-07-24 DIAGNOSIS — I10 Essential (primary) hypertension: Secondary | ICD-10-CM | POA: Diagnosis not present

## 2016-07-24 DIAGNOSIS — M1711 Unilateral primary osteoarthritis, right knee: Secondary | ICD-10-CM | POA: Diagnosis not present

## 2016-07-24 NOTE — Telephone Encounter (Signed)
Pt was on TCM list he was admitted for osteoarthritis in (R) knee. He had a RIGHT TOTAL KNEE ARTHROPLASTY on 07/21/2016. He was D/C 9/7, and will f/u w/specialist Dr. Rhona Raider in 2 weeks...Nathan Gomez

## 2016-07-29 ENCOUNTER — Encounter: Payer: Medicare Other | Admitting: Internal Medicine

## 2016-07-31 DIAGNOSIS — M25661 Stiffness of right knee, not elsewhere classified: Secondary | ICD-10-CM | POA: Diagnosis not present

## 2016-07-31 DIAGNOSIS — M25561 Pain in right knee: Secondary | ICD-10-CM | POA: Diagnosis not present

## 2016-07-31 DIAGNOSIS — M6281 Muscle weakness (generalized): Secondary | ICD-10-CM | POA: Diagnosis not present

## 2016-07-31 DIAGNOSIS — Z96651 Presence of right artificial knee joint: Secondary | ICD-10-CM | POA: Diagnosis not present

## 2016-07-31 DIAGNOSIS — M1711 Unilateral primary osteoarthritis, right knee: Secondary | ICD-10-CM | POA: Diagnosis not present

## 2016-07-31 DIAGNOSIS — Z471 Aftercare following joint replacement surgery: Secondary | ICD-10-CM | POA: Diagnosis not present

## 2016-08-03 DIAGNOSIS — M25661 Stiffness of right knee, not elsewhere classified: Secondary | ICD-10-CM | POA: Diagnosis not present

## 2016-08-03 DIAGNOSIS — M25561 Pain in right knee: Secondary | ICD-10-CM | POA: Diagnosis not present

## 2016-08-03 DIAGNOSIS — M6281 Muscle weakness (generalized): Secondary | ICD-10-CM | POA: Diagnosis not present

## 2016-08-03 DIAGNOSIS — Z96651 Presence of right artificial knee joint: Secondary | ICD-10-CM | POA: Diagnosis not present

## 2016-08-07 DIAGNOSIS — M25661 Stiffness of right knee, not elsewhere classified: Secondary | ICD-10-CM | POA: Diagnosis not present

## 2016-08-07 DIAGNOSIS — M25561 Pain in right knee: Secondary | ICD-10-CM | POA: Diagnosis not present

## 2016-08-07 DIAGNOSIS — M6281 Muscle weakness (generalized): Secondary | ICD-10-CM | POA: Diagnosis not present

## 2016-08-07 DIAGNOSIS — Z96651 Presence of right artificial knee joint: Secondary | ICD-10-CM | POA: Diagnosis not present

## 2016-08-10 ENCOUNTER — Other Ambulatory Visit: Payer: Self-pay | Admitting: Emergency Medicine

## 2016-08-10 MED ORDER — LISINOPRIL 20 MG PO TABS: 20.0000 mg | ORAL_TABLET | Freq: Every day | ORAL | 3 refills | Status: DC

## 2016-08-11 DIAGNOSIS — Z96651 Presence of right artificial knee joint: Secondary | ICD-10-CM | POA: Diagnosis not present

## 2016-08-11 DIAGNOSIS — M6281 Muscle weakness (generalized): Secondary | ICD-10-CM | POA: Diagnosis not present

## 2016-08-11 DIAGNOSIS — M25561 Pain in right knee: Secondary | ICD-10-CM | POA: Diagnosis not present

## 2016-08-11 DIAGNOSIS — M25661 Stiffness of right knee, not elsewhere classified: Secondary | ICD-10-CM | POA: Diagnosis not present

## 2016-08-13 DIAGNOSIS — M25661 Stiffness of right knee, not elsewhere classified: Secondary | ICD-10-CM | POA: Diagnosis not present

## 2016-08-13 DIAGNOSIS — M25561 Pain in right knee: Secondary | ICD-10-CM | POA: Diagnosis not present

## 2016-08-13 DIAGNOSIS — Z96651 Presence of right artificial knee joint: Secondary | ICD-10-CM | POA: Diagnosis not present

## 2016-08-13 DIAGNOSIS — M6281 Muscle weakness (generalized): Secondary | ICD-10-CM | POA: Diagnosis not present

## 2016-08-14 DIAGNOSIS — M25561 Pain in right knee: Secondary | ICD-10-CM | POA: Diagnosis not present

## 2016-08-19 DIAGNOSIS — Z96651 Presence of right artificial knee joint: Secondary | ICD-10-CM | POA: Diagnosis not present

## 2016-08-19 DIAGNOSIS — M25661 Stiffness of right knee, not elsewhere classified: Secondary | ICD-10-CM | POA: Diagnosis not present

## 2016-08-19 DIAGNOSIS — M6281 Muscle weakness (generalized): Secondary | ICD-10-CM | POA: Diagnosis not present

## 2016-08-19 DIAGNOSIS — M25561 Pain in right knee: Secondary | ICD-10-CM | POA: Diagnosis not present

## 2016-08-21 DIAGNOSIS — M6281 Muscle weakness (generalized): Secondary | ICD-10-CM | POA: Diagnosis not present

## 2016-08-21 DIAGNOSIS — M25561 Pain in right knee: Secondary | ICD-10-CM | POA: Diagnosis not present

## 2016-08-21 DIAGNOSIS — M25661 Stiffness of right knee, not elsewhere classified: Secondary | ICD-10-CM | POA: Diagnosis not present

## 2016-08-21 DIAGNOSIS — Z96651 Presence of right artificial knee joint: Secondary | ICD-10-CM | POA: Diagnosis not present

## 2016-08-24 DIAGNOSIS — Z96651 Presence of right artificial knee joint: Secondary | ICD-10-CM | POA: Diagnosis not present

## 2016-08-24 DIAGNOSIS — M6281 Muscle weakness (generalized): Secondary | ICD-10-CM | POA: Diagnosis not present

## 2016-08-24 DIAGNOSIS — M25661 Stiffness of right knee, not elsewhere classified: Secondary | ICD-10-CM | POA: Diagnosis not present

## 2016-08-24 DIAGNOSIS — M25561 Pain in right knee: Secondary | ICD-10-CM | POA: Diagnosis not present

## 2016-08-25 ENCOUNTER — Other Ambulatory Visit (INDEPENDENT_AMBULATORY_CARE_PROVIDER_SITE_OTHER): Payer: Medicare Other

## 2016-08-25 ENCOUNTER — Ambulatory Visit (INDEPENDENT_AMBULATORY_CARE_PROVIDER_SITE_OTHER): Payer: Medicare Other | Admitting: Internal Medicine

## 2016-08-25 ENCOUNTER — Encounter: Payer: Self-pay | Admitting: Internal Medicine

## 2016-08-25 VITALS — BP 130/84 | HR 75 | Temp 97.8°F | Resp 16 | Ht 72.0 in | Wt 190.0 lb

## 2016-08-25 DIAGNOSIS — R972 Elevated prostate specific antigen [PSA]: Secondary | ICD-10-CM | POA: Insufficient documentation

## 2016-08-25 DIAGNOSIS — E119 Type 2 diabetes mellitus without complications: Secondary | ICD-10-CM

## 2016-08-25 DIAGNOSIS — I10 Essential (primary) hypertension: Secondary | ICD-10-CM

## 2016-08-25 DIAGNOSIS — Z23 Encounter for immunization: Secondary | ICD-10-CM | POA: Diagnosis not present

## 2016-08-25 DIAGNOSIS — E538 Deficiency of other specified B group vitamins: Secondary | ICD-10-CM | POA: Diagnosis not present

## 2016-08-25 DIAGNOSIS — E7800 Pure hypercholesterolemia, unspecified: Secondary | ICD-10-CM

## 2016-08-25 DIAGNOSIS — D649 Anemia, unspecified: Secondary | ICD-10-CM

## 2016-08-25 LAB — CBC WITH DIFFERENTIAL/PLATELET
Basophils Absolute: 0 10*3/uL (ref 0.0–0.1)
Basophils Relative: 0.4 % (ref 0.0–3.0)
Eosinophils Absolute: 0.2 10*3/uL (ref 0.0–0.7)
Eosinophils Relative: 3.5 % (ref 0.0–5.0)
HCT: 33.6 % — ABNORMAL LOW (ref 39.0–52.0)
Hemoglobin: 11.4 g/dL — ABNORMAL LOW (ref 13.0–17.0)
Lymphocytes Relative: 21.8 % (ref 12.0–46.0)
Lymphs Abs: 1.2 10*3/uL (ref 0.7–4.0)
MCHC: 33.8 g/dL (ref 30.0–36.0)
MCV: 91.4 fl (ref 78.0–100.0)
Monocytes Absolute: 0.4 10*3/uL (ref 0.1–1.0)
Monocytes Relative: 7.6 % (ref 3.0–12.0)
Neutro Abs: 3.8 10*3/uL (ref 1.4–7.7)
Neutrophils Relative %: 66.7 % (ref 43.0–77.0)
Platelets: 205 10*3/uL (ref 150.0–400.0)
RBC: 3.67 Mil/uL — ABNORMAL LOW (ref 4.22–5.81)
RDW: 13.7 % (ref 11.5–15.5)
WBC: 5.7 10*3/uL (ref 4.0–10.5)

## 2016-08-25 LAB — COMPREHENSIVE METABOLIC PANEL
ALT: 8 U/L (ref 0–53)
AST: 10 U/L (ref 0–37)
Albumin: 4 g/dL (ref 3.5–5.2)
Alkaline Phosphatase: 85 U/L (ref 39–117)
BUN: 29 mg/dL — ABNORMAL HIGH (ref 6–23)
CO2: 25 mEq/L (ref 19–32)
Calcium: 9.6 mg/dL (ref 8.4–10.5)
Chloride: 105 mEq/L (ref 96–112)
Creatinine, Ser: 1.56 mg/dL — ABNORMAL HIGH (ref 0.40–1.50)
GFR: 46.06 mL/min — ABNORMAL LOW (ref >60.00)
Glucose, Bld: 137 mg/dL — ABNORMAL HIGH (ref 70–99)
Potassium: 4.8 mEq/L (ref 3.5–5.1)
Sodium: 140 mEq/L (ref 135–145)
Total Bilirubin: 0.4 mg/dL (ref 0.2–1.2)
Total Protein: 6.9 g/dL (ref 6.0–8.3)

## 2016-08-25 LAB — HEMOGLOBIN A1C: Hgb A1c MFr Bld: 6.8 % — ABNORMAL HIGH (ref 4.6–6.5)

## 2016-08-25 LAB — IRON: Iron: 63 ug/dL (ref 42–165)

## 2016-08-25 LAB — VITAMIN B12: Vitamin B-12: 344 pg/mL (ref 211–911)

## 2016-08-25 LAB — FERRITIN: Ferritin: 79 ng/mL (ref 22.0–322.0)

## 2016-08-25 NOTE — Assessment & Plan Note (Signed)
Check cbc, iron/ferritin 

## 2016-08-25 NOTE — Assessment & Plan Note (Signed)
BP well controlled Current regimen effective and well tolerated Continue current medications at current doses cmp  

## 2016-08-25 NOTE — Assessment & Plan Note (Signed)
B!2 level

## 2016-08-25 NOTE — Assessment & Plan Note (Addendum)
Lab Results  Component Value Date   HGBA1C 6.9 (H) 07/10/2016    Sugars well controlled Continue current dose of metformin Check a1c

## 2016-08-25 NOTE — Assessment & Plan Note (Signed)
Continue statin. 

## 2016-08-25 NOTE — Progress Notes (Signed)
Pre visit review using our clinic review tool, if applicable. No additional management support is needed unless otherwise documented below in the visit note. 

## 2016-08-25 NOTE — Progress Notes (Signed)
Subjective:    Patient ID: Nathan Gomez, male    DOB: Dec 12, 1940, 75 y.o.   MRN: [id number]  HPI The patient is here for follow up.  Diabetes: He is taking his medication daily as prescribed. He is compliant with a diabetic diet. He is exercising regularly- PT for knee. He monitors his sugars and they have been running 120-150.   Hypertension: He is taking his medication daily. He is compliant with a low sodium diet.  He denies chest pain, palpitations, edema, shortness of breath and regular headaches. He is exercising regularly.   Hyperlipidemia: He is taking his medication daily. He is compliant with a low fat/cholesterol diet. He is exercising regularly. He denies myalgias.   He takes occasional nsaids.  He drinks a lot of water during the day.    Medications and allergies reviewed with patient and updated if appropriate.  Patient Active Problem List   Diagnosis Date Noted  . B12 deficiency 01/30/2016  . Urinary retention 07/30/2015  . Primary osteoarthritis of right knee 07/09/2015  . Personal history of colonic adenomas 01/16/2013  . Pernicious anemia 02/22/2012  . Anemia   . Diabetes mellitus type 2, controlled ([place]) 01/05/2011  . Hyperlipidemia 01/05/2011  . Essential hypertension 01/05/2011  . Osteoarthritis 01/05/2011    Current Outpatient Prescriptions on File Prior to Visit  Medication Sig Dispense Refill  . amLODipine (NORVASC) 10 MG tablet Take 1 tablet (10 mg total) by mouth daily. 90 tablet 1  . atorvastatin (LIPITOR) 40 MG tablet Take 0.5 tablets (20 mg total) by mouth daily. 135 tablet 1  . blood glucose meter kit and supplies KIT Use to test blood sugar up to twice a day. DX E11.09 1 each 0  . Cholecalciferol (VITAMIN D) 2000 UNITS CAPS Take 1 capsule (2,000 Units total) by mouth every morning. 90 capsule 3  . Cyanocobalamin (VITAMIN B12 PO) Take 1 tablet by mouth daily.    Marland Kitchen GLUCOSAMINE SULFATE PO Take 1 tablet by mouth daily.    Marland Kitchen glucose blood test  strip Use as instructed to test blood sugar up to twice a day. DX:  E11.09 200 each 3  . hydrochlorothiazide (HYDRODIURIL) 25 MG tablet Take 1 tablet (25 mg total) by mouth every morning. 90 tablet 1  . HYDROcodone-acetaminophen (NORCO) 10-325 MG tablet Take 1-2 tablets by mouth every 4 (four) hours as needed. 50 tablet 0  . Inositol Niacinate (NIACIN FLUSH FREE) 500 MG CAPS Take 1 capsule by mouth at bedtime.      Javier Docker Oil 300 MG CAPS Take 500 mg by mouth daily.     . Lancets 81P MISC Delica Fine Lancets. Use to test blood sugar up to twice a day. DX E11.09 200 each 3  . lisinopril (PRINIVIL,ZESTRIL) 20 MG tablet Take 1 tablet (20 mg total) by mouth daily. 90 tablet 3  . loratadine (CLARITIN) 10 MG tablet Take 1 tablet (10 mg total) by mouth daily. (Patient taking differently: Take 10 mg by mouth daily as needed for allergies. ) 90 tablet 3  . magnesium oxide (MAG-OX) 400 MG tablet Take 1 tablet (400 mg total) by mouth 2 (two) times daily. 180 tablet 3  . metFORMIN (GLUCOPHAGE-XR) 500 MG 24 hr tablet TAKE 2 TABLETS (1,000 MG TOTAL) BY MOUTH 2 (TWO) TIMES DAILY. 120 tablet 2  . methocarbamol (ROBAXIN) 750 MG tablet Take 1 tablet (750 mg total) by mouth every 6 (six) hours as needed for muscle spasms. 50 tablet 0  .  Multiple Vitamins-Minerals (SENIOR MULTIVITAMIN PLUS) TABS Take 1 tablet by mouth daily.      Marland Kitchen omeprazole (PRILOSEC) 20 MG capsule Take 1 capsule (20 mg total) by mouth at bedtime. 90 capsule 3  . Probiotic Product (PROBIOTIC PO) Take 1 capsule by mouth daily.    . Probiotic Product (SOLUBLE FIBER/PROBIOTICS) CHEW Chew 2 tablets by mouth daily.    . tamsulosin (FLOMAX) 0.4 MG CAPS capsule Take 1 capsule (0.4 mg total) by mouth at bedtime. (Patient taking differently: Take 0.4-0.8 mg by mouth See admin instructions. Take 0.4 mg by mouth daily. Starting 7 days before procedure take 0.20m by mouth daily) 14 capsule 0  . Triamcinolone Acetonide (NASACORT AQ NA) Place 1 spray into the nose  daily as needed (allergies).    .Marland Kitchenaspirin 325 MG EC tablet Take 1 tablet (325 mg total) by mouth 2 (two) times daily. (Patient not taking: Reported on 08/25/2016) 30 tablet 0  . [DISCONTINUED] tadalafil (CIALIS) 20 MG tablet Take 0.5-1 tablets (10-20 mg total) by mouth every other day as needed for erectile dysfunction. (Patient not taking: Reported on 06/28/2015) 30 tablet 0   No current facility-administered medications on file prior to visit.     Past Medical History:  Diagnosis Date  . Achilles tendinitis   . Complication of anesthesia    urinary retention  . 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 Diabetes [place] type 2 [place]   . Dyslipidemia   . GERD (gastroesophageal reflux disease)   . Heart murmur    child  . Hypertension   . Osteoarthritis    right knee  . Personal history of colonic adenomas 01/16/2013  . Plantar fasciitis     Past Surgical History:  Procedure Laterality Date  . CATARACT EXTRACTION  08/2012 L, 09/2012 R  . COLONOSCOPY    . TONSILLECTOMY  1945  . TONSILLECTOMY    . TOTAL HIP ARTHROPLASTY  2011   Left  . TOTAL HIP ARTHROPLASTY Right 07/09/2015   Procedure: TOTAL HIP ARTHROPLASTY ANTERIOR APPROACH;  Surgeon: PMelrose Nakayama MD;  [facility]  Service: Orthopedics;  Laterality: Right;  . TOTAL KNEE ARTHROPLASTY Right 07/21/2016   Procedure: RIGHT TOTAL KNEE ARTHROPLASTY;  Surgeon: PMelrose Nakayama MD;  [facility]: [facility]  Service: Orthopedics;  Laterality: Right;    Social History   Social History  . Marital status: Married    Spouse name: N/A  . Number of children: N/A  . Years of education: N/A   Social History Main Topics  . Smoking status: Former Smoker    Packs/day: 1.00    Years: 20.00    Quit date: 11/16/1986  . Smokeless tobacco: Never Used  . Alcohol use 0.6 - 1.2 oz/week    1 - 2 Glasses of wine per week  . Drug use: No  . Sexual activity: Not on file   Other Topics Concern  . Not on file   Social History Narrative   . No narrative on file    Family History  Problem Relation Age of Onset  . Ovarian cancer Mother   . Hypertension Father   . Diabetes Maternal Grandfather   . Pneumonia Paternal Grandfather     Review of Systems  Constitutional: Negative for chills and fever.  Respiratory: Negative for cough, shortness of breath and wheezing.   Cardiovascular: Positive for leg swelling (due to knee OA b/l, worse at end of day). Negative for chest pain and palpitations.  Neurological: Negative for light-headedness, numbness and  headaches.       Objective:   Vitals:   08/25/16 0834  BP: 130/84  Pulse: 75  Resp: 16  Temp: 97.8 F (36.6 C)   Filed Weights   08/25/16 0834  Weight: 190 lb (86.2 kg)   Body mass index is 25.77 kg/m.   Physical Exam    Constitutional: Appears well-developed and well-nourished. No distress.  HENT:  Head: Normocephalic and atraumatic.  Neck: Neck supple. No tracheal deviation present. No thyromegaly present.  No cervical lymphadenopathy Cardiovascular: Normal rate, regular rhythm and normal heart sounds.   No murmur heard. No carotid bruit .  Mild B/L LE edema R > L Pulmonary/Chest: Effort normal and breath sounds normal. No respiratory distress. No has no wheezes. No rales.  Skin: Skin is warm and dry. Not diaphoretic.  Psychiatric: Normal mood and affect. Behavior is normal.      Assessment & Plan:    See Problem List for Assessment and Plan of chronic medical problems.

## 2016-08-25 NOTE — Patient Instructions (Addendum)
  Test(s) ordered today. Your results will be released to MyChart (or called to you) after review, usually within 72hours after test completion. If any changes need to be made, you will be notified at that same time.  All other Health Maintenance issues reviewed.   All recommended immunizations and age-appropriate screenings are up-to-date or discussed.  Flu vaccine administered today.   Medications reviewed and updated.  No changes recommended at this time.    Please followup in 6 months   

## 2016-08-26 DIAGNOSIS — M25661 Stiffness of right knee, not elsewhere classified: Secondary | ICD-10-CM | POA: Diagnosis not present

## 2016-08-26 DIAGNOSIS — Z96651 Presence of right artificial knee joint: Secondary | ICD-10-CM | POA: Diagnosis not present

## 2016-08-26 DIAGNOSIS — M25561 Pain in right knee: Secondary | ICD-10-CM | POA: Diagnosis not present

## 2016-08-26 DIAGNOSIS — M6281 Muscle weakness (generalized): Secondary | ICD-10-CM | POA: Diagnosis not present

## 2016-08-27 ENCOUNTER — Encounter: Payer: Self-pay | Admitting: Internal Medicine

## 2016-08-27 DIAGNOSIS — M6281 Muscle weakness (generalized): Secondary | ICD-10-CM | POA: Diagnosis not present

## 2016-08-27 DIAGNOSIS — Z96651 Presence of right artificial knee joint: Secondary | ICD-10-CM | POA: Diagnosis not present

## 2016-08-27 DIAGNOSIS — M25661 Stiffness of right knee, not elsewhere classified: Secondary | ICD-10-CM | POA: Diagnosis not present

## 2016-08-27 DIAGNOSIS — M25561 Pain in right knee: Secondary | ICD-10-CM | POA: Diagnosis not present

## 2016-08-28 DIAGNOSIS — Z96651 Presence of right artificial knee joint: Secondary | ICD-10-CM | POA: Diagnosis not present

## 2016-08-28 DIAGNOSIS — M25561 Pain in right knee: Secondary | ICD-10-CM | POA: Diagnosis not present

## 2016-08-28 DIAGNOSIS — Z471 Aftercare following joint replacement surgery: Secondary | ICD-10-CM | POA: Diagnosis not present

## 2016-08-30 ENCOUNTER — Encounter: Payer: Self-pay | Admitting: Internal Medicine

## 2016-08-31 ENCOUNTER — Encounter: Payer: Self-pay | Admitting: Internal Medicine

## 2016-08-31 NOTE — Telephone Encounter (Signed)
Please advise 

## 2016-09-01 DIAGNOSIS — Z96651 Presence of right artificial knee joint: Secondary | ICD-10-CM | POA: Diagnosis not present

## 2016-09-01 DIAGNOSIS — M25661 Stiffness of right knee, not elsewhere classified: Secondary | ICD-10-CM | POA: Diagnosis not present

## 2016-09-01 DIAGNOSIS — M25561 Pain in right knee: Secondary | ICD-10-CM | POA: Diagnosis not present

## 2016-09-01 DIAGNOSIS — M6281 Muscle weakness (generalized): Secondary | ICD-10-CM | POA: Diagnosis not present

## 2016-09-03 DIAGNOSIS — M25661 Stiffness of right knee, not elsewhere classified: Secondary | ICD-10-CM | POA: Diagnosis not present

## 2016-09-03 DIAGNOSIS — M6281 Muscle weakness (generalized): Secondary | ICD-10-CM | POA: Diagnosis not present

## 2016-09-03 DIAGNOSIS — Z96651 Presence of right artificial knee joint: Secondary | ICD-10-CM | POA: Diagnosis not present

## 2016-09-03 DIAGNOSIS — M25561 Pain in right knee: Secondary | ICD-10-CM | POA: Diagnosis not present

## 2016-09-05 ENCOUNTER — Other Ambulatory Visit: Payer: Self-pay | Admitting: Internal Medicine

## 2016-09-07 DIAGNOSIS — M25561 Pain in right knee: Secondary | ICD-10-CM | POA: Diagnosis not present

## 2016-09-07 DIAGNOSIS — Z96651 Presence of right artificial knee joint: Secondary | ICD-10-CM | POA: Diagnosis not present

## 2016-09-07 DIAGNOSIS — M25661 Stiffness of right knee, not elsewhere classified: Secondary | ICD-10-CM | POA: Diagnosis not present

## 2016-09-07 DIAGNOSIS — M6281 Muscle weakness (generalized): Secondary | ICD-10-CM | POA: Diagnosis not present

## 2016-09-10 DIAGNOSIS — Z96651 Presence of right artificial knee joint: Secondary | ICD-10-CM | POA: Diagnosis not present

## 2016-09-10 DIAGNOSIS — M25661 Stiffness of right knee, not elsewhere classified: Secondary | ICD-10-CM | POA: Diagnosis not present

## 2016-09-11 DIAGNOSIS — Z96651 Presence of right artificial knee joint: Secondary | ICD-10-CM | POA: Diagnosis not present

## 2016-09-11 DIAGNOSIS — M6281 Muscle weakness (generalized): Secondary | ICD-10-CM | POA: Diagnosis not present

## 2016-09-11 DIAGNOSIS — M25661 Stiffness of right knee, not elsewhere classified: Secondary | ICD-10-CM | POA: Diagnosis not present

## 2016-09-11 DIAGNOSIS — M25561 Pain in right knee: Secondary | ICD-10-CM | POA: Diagnosis not present

## 2016-09-14 DIAGNOSIS — M25661 Stiffness of right knee, not elsewhere classified: Secondary | ICD-10-CM | POA: Diagnosis not present

## 2016-09-14 DIAGNOSIS — M25561 Pain in right knee: Secondary | ICD-10-CM | POA: Diagnosis not present

## 2016-09-14 DIAGNOSIS — Z96651 Presence of right artificial knee joint: Secondary | ICD-10-CM | POA: Diagnosis not present

## 2016-09-14 DIAGNOSIS — M6281 Muscle weakness (generalized): Secondary | ICD-10-CM | POA: Diagnosis not present

## 2016-09-16 DIAGNOSIS — Z96651 Presence of right artificial knee joint: Secondary | ICD-10-CM | POA: Diagnosis not present

## 2016-09-16 DIAGNOSIS — M6281 Muscle weakness (generalized): Secondary | ICD-10-CM | POA: Diagnosis not present

## 2016-09-16 DIAGNOSIS — M25561 Pain in right knee: Secondary | ICD-10-CM | POA: Diagnosis not present

## 2016-09-16 DIAGNOSIS — M25661 Stiffness of right knee, not elsewhere classified: Secondary | ICD-10-CM | POA: Diagnosis not present

## 2016-09-18 DIAGNOSIS — M25661 Stiffness of right knee, not elsewhere classified: Secondary | ICD-10-CM | POA: Diagnosis not present

## 2016-09-18 DIAGNOSIS — Z96651 Presence of right artificial knee joint: Secondary | ICD-10-CM | POA: Diagnosis not present

## 2016-09-18 DIAGNOSIS — M6281 Muscle weakness (generalized): Secondary | ICD-10-CM | POA: Diagnosis not present

## 2016-09-18 DIAGNOSIS — M25561 Pain in right knee: Secondary | ICD-10-CM | POA: Diagnosis not present

## 2016-09-21 DIAGNOSIS — M25661 Stiffness of right knee, not elsewhere classified: Secondary | ICD-10-CM | POA: Diagnosis not present

## 2016-09-21 DIAGNOSIS — M25561 Pain in right knee: Secondary | ICD-10-CM | POA: Diagnosis not present

## 2016-09-21 DIAGNOSIS — Z96651 Presence of right artificial knee joint: Secondary | ICD-10-CM | POA: Diagnosis not present

## 2016-09-21 DIAGNOSIS — M6281 Muscle weakness (generalized): Secondary | ICD-10-CM | POA: Diagnosis not present

## 2016-09-23 DIAGNOSIS — M25661 Stiffness of right knee, not elsewhere classified: Secondary | ICD-10-CM | POA: Diagnosis not present

## 2016-09-23 DIAGNOSIS — Z96651 Presence of right artificial knee joint: Secondary | ICD-10-CM | POA: Diagnosis not present

## 2016-09-23 DIAGNOSIS — M25561 Pain in right knee: Secondary | ICD-10-CM | POA: Diagnosis not present

## 2016-09-23 DIAGNOSIS — M6281 Muscle weakness (generalized): Secondary | ICD-10-CM | POA: Diagnosis not present

## 2016-09-28 DIAGNOSIS — M25661 Stiffness of right knee, not elsewhere classified: Secondary | ICD-10-CM | POA: Diagnosis not present

## 2016-09-28 DIAGNOSIS — M25561 Pain in right knee: Secondary | ICD-10-CM | POA: Diagnosis not present

## 2016-09-28 DIAGNOSIS — M6281 Muscle weakness (generalized): Secondary | ICD-10-CM | POA: Diagnosis not present

## 2016-09-28 DIAGNOSIS — Z96651 Presence of right artificial knee joint: Secondary | ICD-10-CM | POA: Diagnosis not present

## 2016-09-30 DIAGNOSIS — M6281 Muscle weakness (generalized): Secondary | ICD-10-CM | POA: Diagnosis not present

## 2016-09-30 DIAGNOSIS — Z96651 Presence of right artificial knee joint: Secondary | ICD-10-CM | POA: Diagnosis not present

## 2016-09-30 DIAGNOSIS — M25661 Stiffness of right knee, not elsewhere classified: Secondary | ICD-10-CM | POA: Diagnosis not present

## 2016-09-30 DIAGNOSIS — M25561 Pain in right knee: Secondary | ICD-10-CM | POA: Diagnosis not present

## 2016-10-13 DIAGNOSIS — M6281 Muscle weakness (generalized): Secondary | ICD-10-CM | POA: Diagnosis not present

## 2016-10-13 DIAGNOSIS — M25661 Stiffness of right knee, not elsewhere classified: Secondary | ICD-10-CM | POA: Diagnosis not present

## 2016-10-13 DIAGNOSIS — Z96651 Presence of right artificial knee joint: Secondary | ICD-10-CM | POA: Diagnosis not present

## 2016-10-13 DIAGNOSIS — M25561 Pain in right knee: Secondary | ICD-10-CM | POA: Diagnosis not present

## 2016-10-16 DIAGNOSIS — Z96651 Presence of right artificial knee joint: Secondary | ICD-10-CM | POA: Diagnosis not present

## 2016-10-16 DIAGNOSIS — M25561 Pain in right knee: Secondary | ICD-10-CM | POA: Diagnosis not present

## 2016-11-18 DIAGNOSIS — M79671 Pain in right foot: Secondary | ICD-10-CM | POA: Diagnosis not present

## 2016-11-18 DIAGNOSIS — M1712 Unilateral primary osteoarthritis, left knee: Secondary | ICD-10-CM | POA: Diagnosis not present

## 2016-11-18 DIAGNOSIS — M79672 Pain in left foot: Secondary | ICD-10-CM | POA: Diagnosis not present

## 2016-11-20 DIAGNOSIS — M79671 Pain in right foot: Secondary | ICD-10-CM | POA: Diagnosis not present

## 2016-11-23 DIAGNOSIS — M79671 Pain in right foot: Secondary | ICD-10-CM | POA: Diagnosis not present

## 2016-11-25 DIAGNOSIS — M1712 Unilateral primary osteoarthritis, left knee: Secondary | ICD-10-CM | POA: Diagnosis not present

## 2016-11-26 DIAGNOSIS — M79671 Pain in right foot: Secondary | ICD-10-CM | POA: Diagnosis not present

## 2016-11-30 DIAGNOSIS — M79671 Pain in right foot: Secondary | ICD-10-CM | POA: Diagnosis not present

## 2016-12-02 DIAGNOSIS — M1712 Unilateral primary osteoarthritis, left knee: Secondary | ICD-10-CM | POA: Diagnosis not present

## 2016-12-07 DIAGNOSIS — M79671 Pain in right foot: Secondary | ICD-10-CM | POA: Diagnosis not present

## 2016-12-08 DIAGNOSIS — M79671 Pain in right foot: Secondary | ICD-10-CM | POA: Diagnosis not present

## 2016-12-09 DIAGNOSIS — M1712 Unilateral primary osteoarthritis, left knee: Secondary | ICD-10-CM | POA: Diagnosis not present

## 2016-12-10 DIAGNOSIS — M79671 Pain in right foot: Secondary | ICD-10-CM | POA: Diagnosis not present

## 2016-12-14 DIAGNOSIS — M79671 Pain in right foot: Secondary | ICD-10-CM | POA: Diagnosis not present

## 2016-12-16 DIAGNOSIS — M79671 Pain in right foot: Secondary | ICD-10-CM | POA: Diagnosis not present

## 2016-12-16 DIAGNOSIS — M1712 Unilateral primary osteoarthritis, left knee: Secondary | ICD-10-CM | POA: Diagnosis not present

## 2016-12-17 DIAGNOSIS — M79671 Pain in right foot: Secondary | ICD-10-CM | POA: Diagnosis not present

## 2017-01-12 IMAGING — CR DG CHEST 2V
2 series · 2 of 2 positions shown · non-contrast
Comparison: 02/22/2012.

CLINICAL DATA: Hypertension.

EXAM:
CHEST  2 VIEW

[w chest pa]
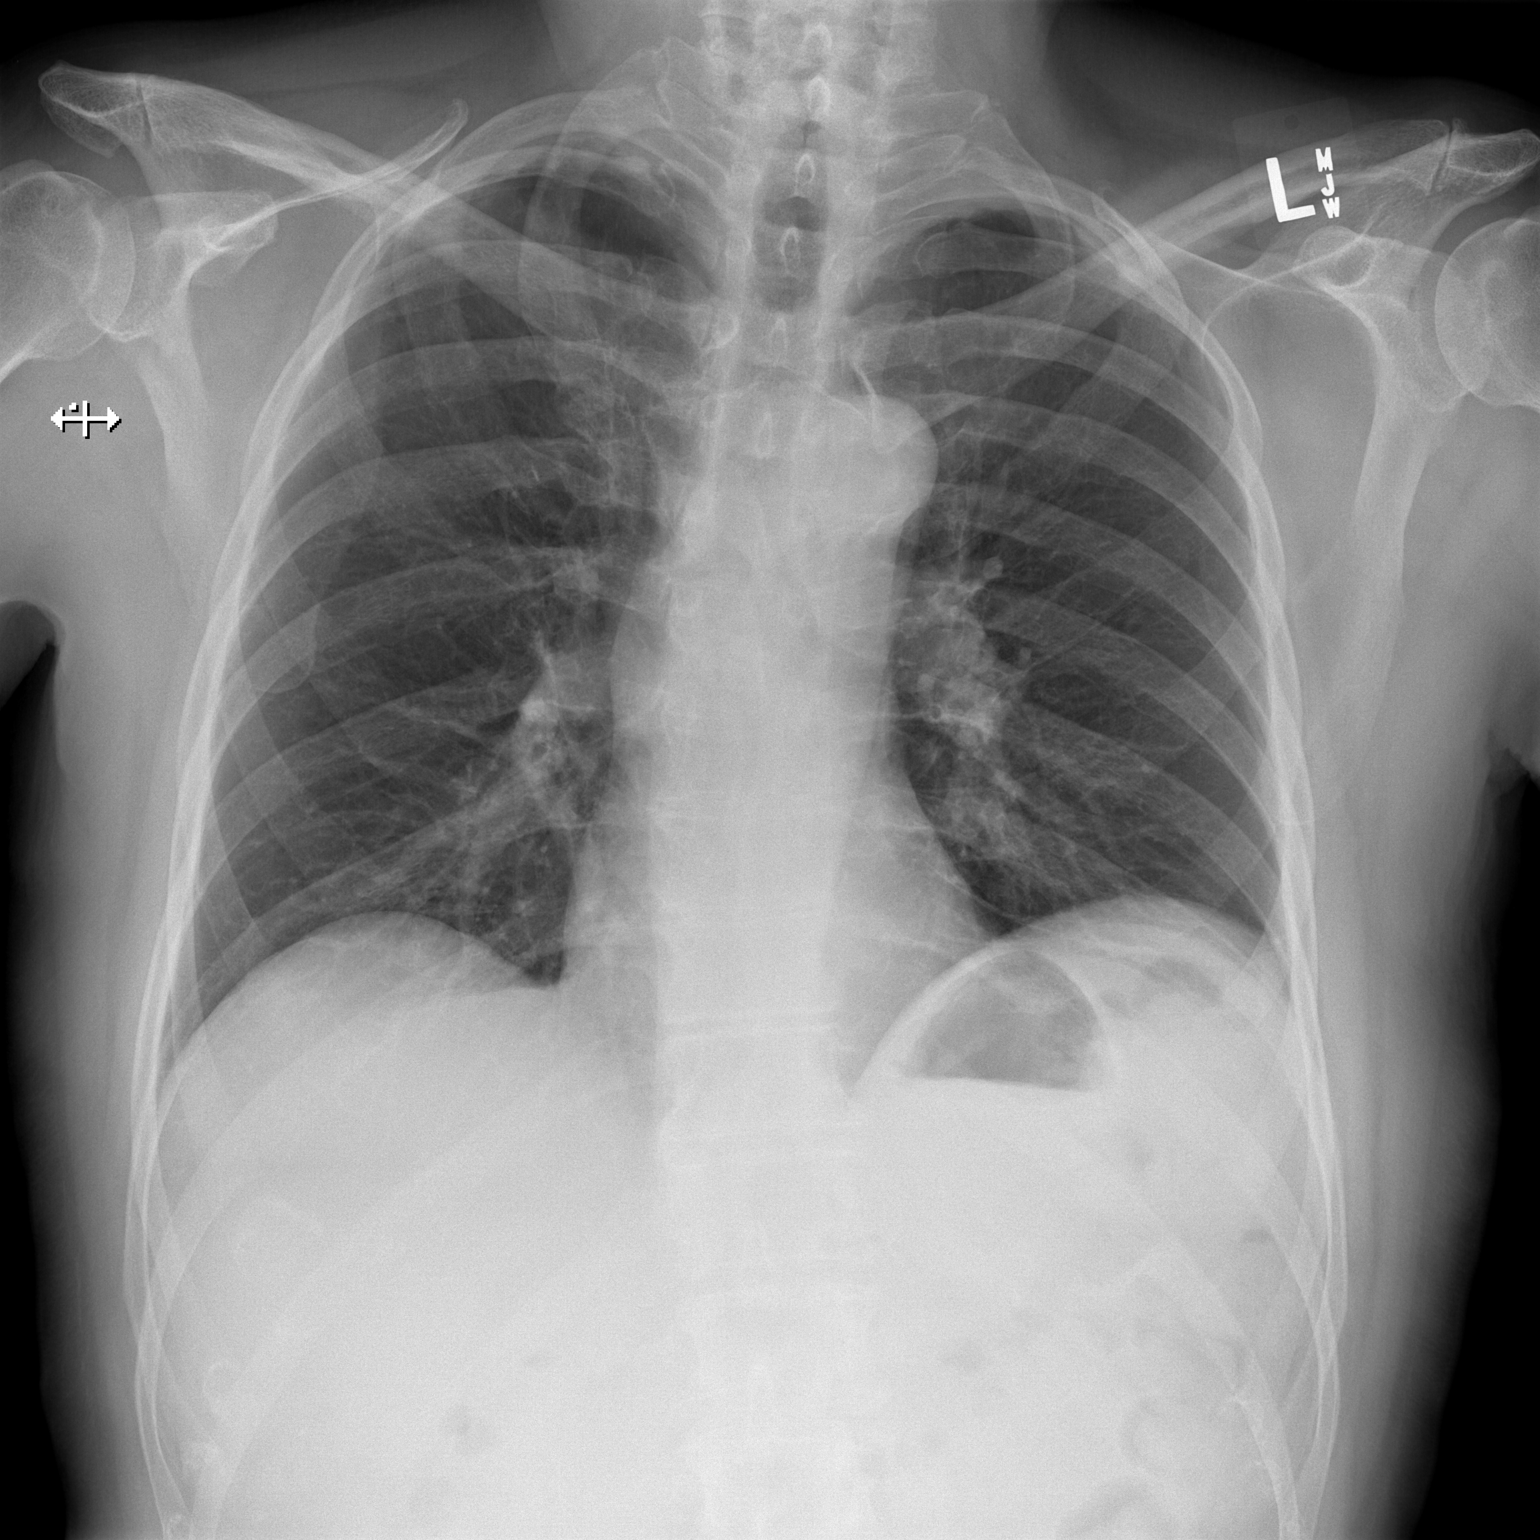

[w chest lat]
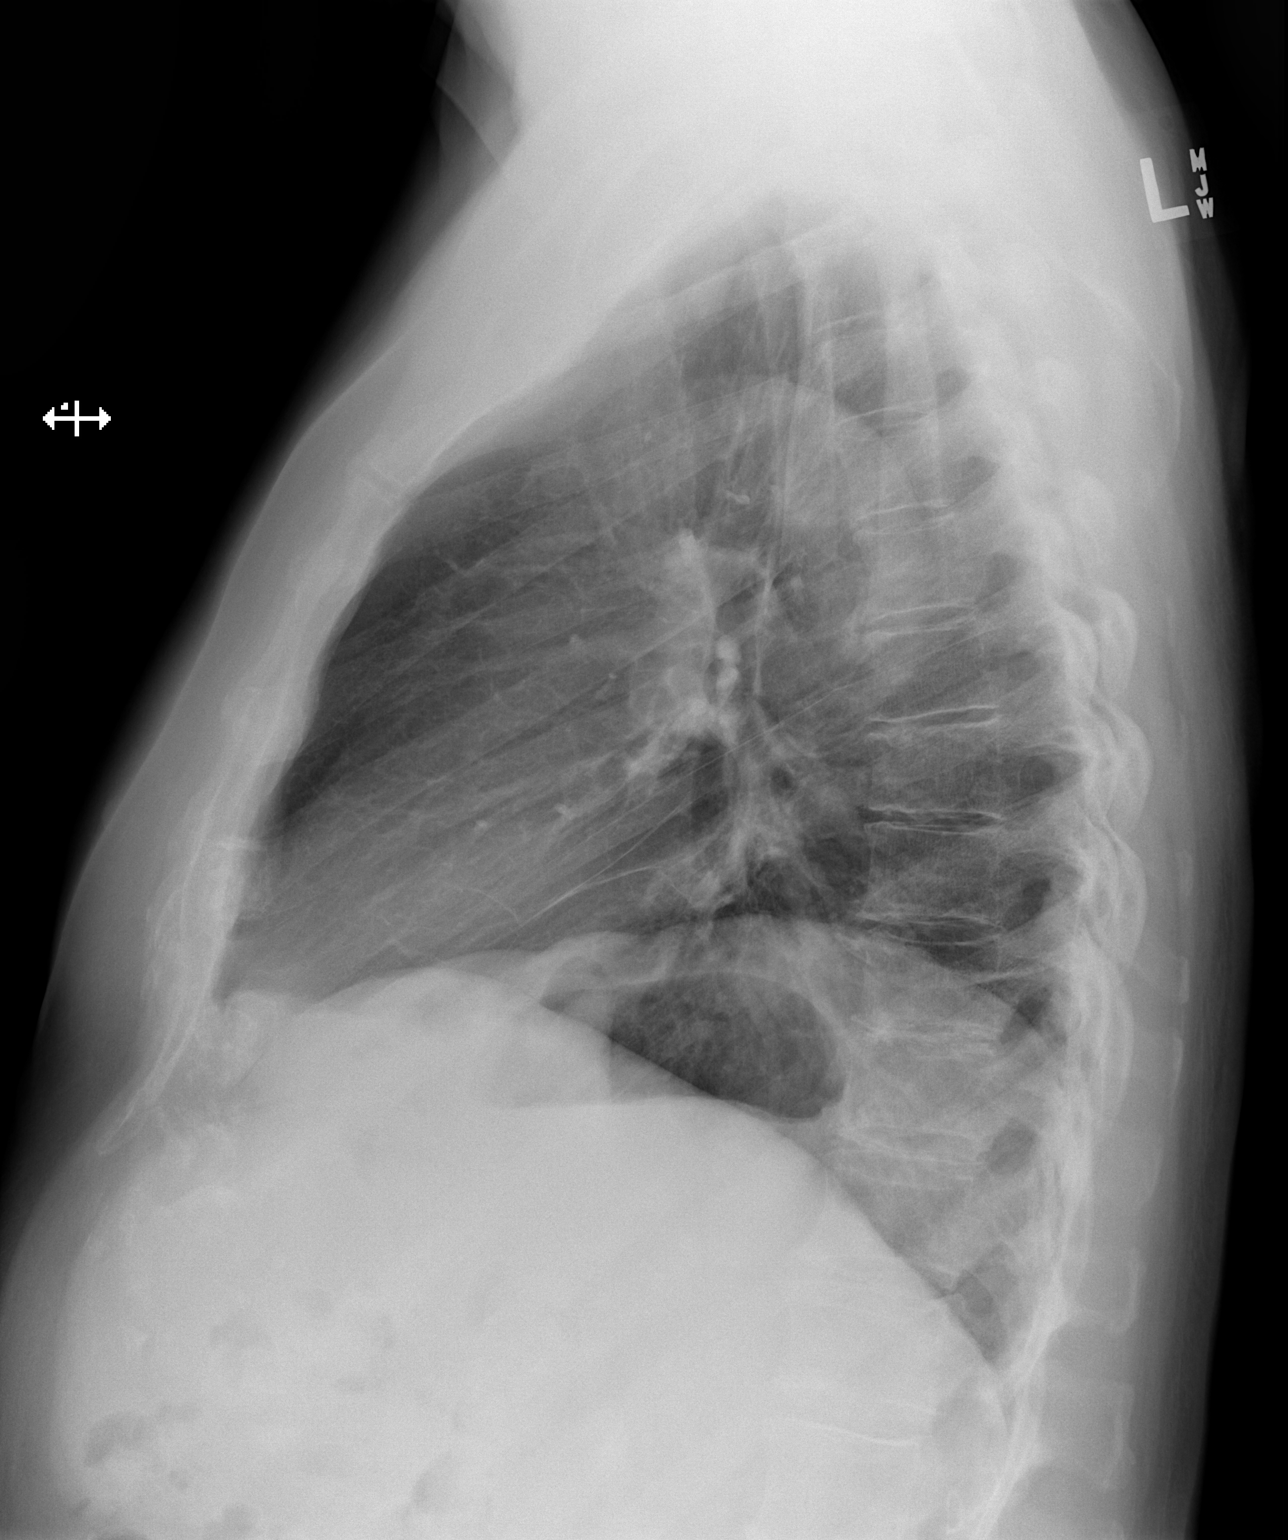

[2 of 2 positions shown; findings below may reference images not displayed]

FINDINGS: Mediastinum and hilar structures are normal. Mild bibasilar
subsegmental atelectasis and/or scarring. Minimal basilar
infiltrates cannot be excluded . Similar findings noted on prior
exam P No pleural effusion or pneumothorax. Degenerative changes
thoracic spine .
IMPRESSION: Mild bibasilar subsegmental atelectasis and/or scarring. Minimal
basilar infiltrates cannot be excluded. Similar findings noted on
02/22/2012.

## 2017-01-19 ENCOUNTER — Other Ambulatory Visit: Payer: Self-pay | Admitting: Internal Medicine

## 2017-01-22 DIAGNOSIS — R972 Elevated prostate specific antigen [PSA]: Secondary | ICD-10-CM | POA: Diagnosis not present

## 2017-01-23 IMAGING — RF DG HIP (WITH PELVIS) OPERATIVE*R*
1 series · 2 of 2 positions shown · non-contrast
Comparison: 08/11/2010

CLINICAL DATA: Right total hip replacement.

EXAM:
OPERATIVE right HIP (WITH PELVIS IF PERFORMED) 2 VIEWS
TECHNIQUE: Fluoroscopic spot image(s) were submitted for interpretation
post-operatively.
FLUOROSCOPY TIME:  0 minutes 25 seconds.

[Series 1: run · 2 of 2 slices shown]
[im 1/2]
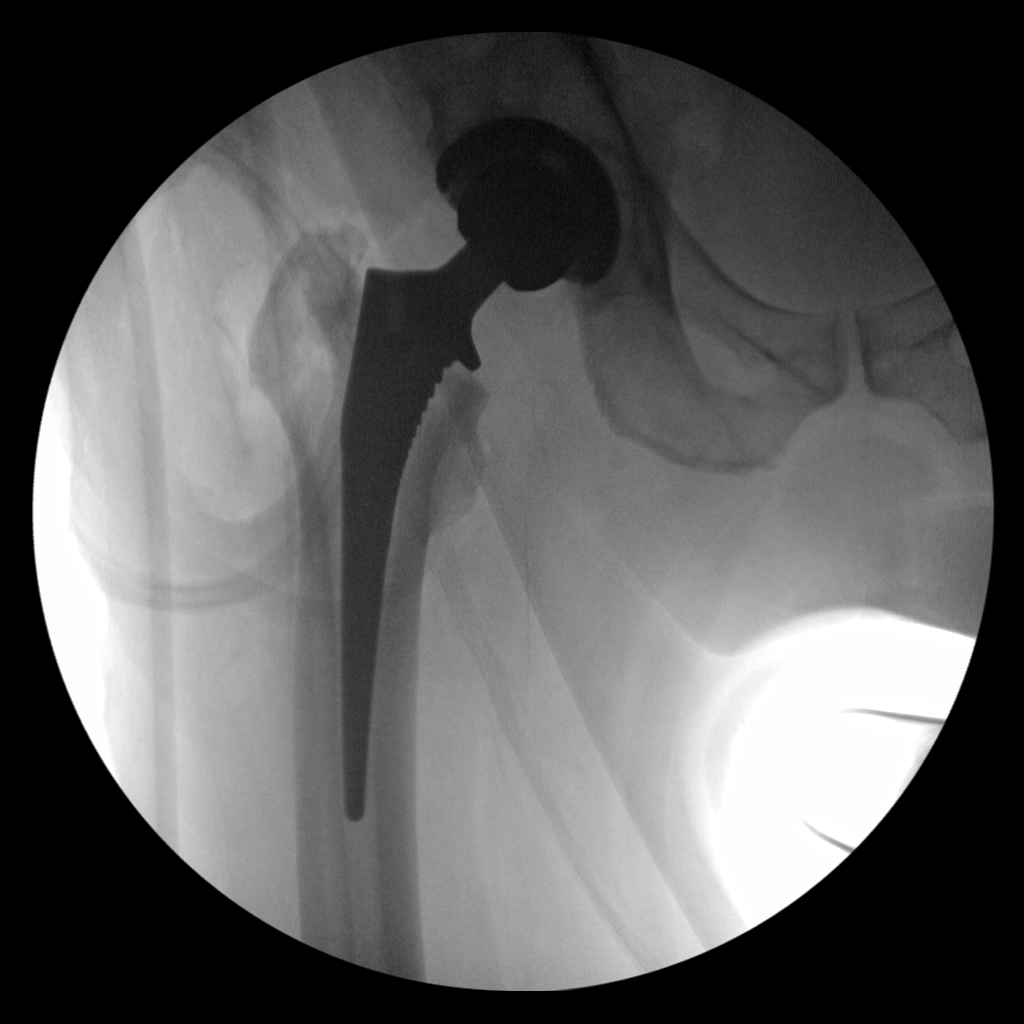
[im 2/2]
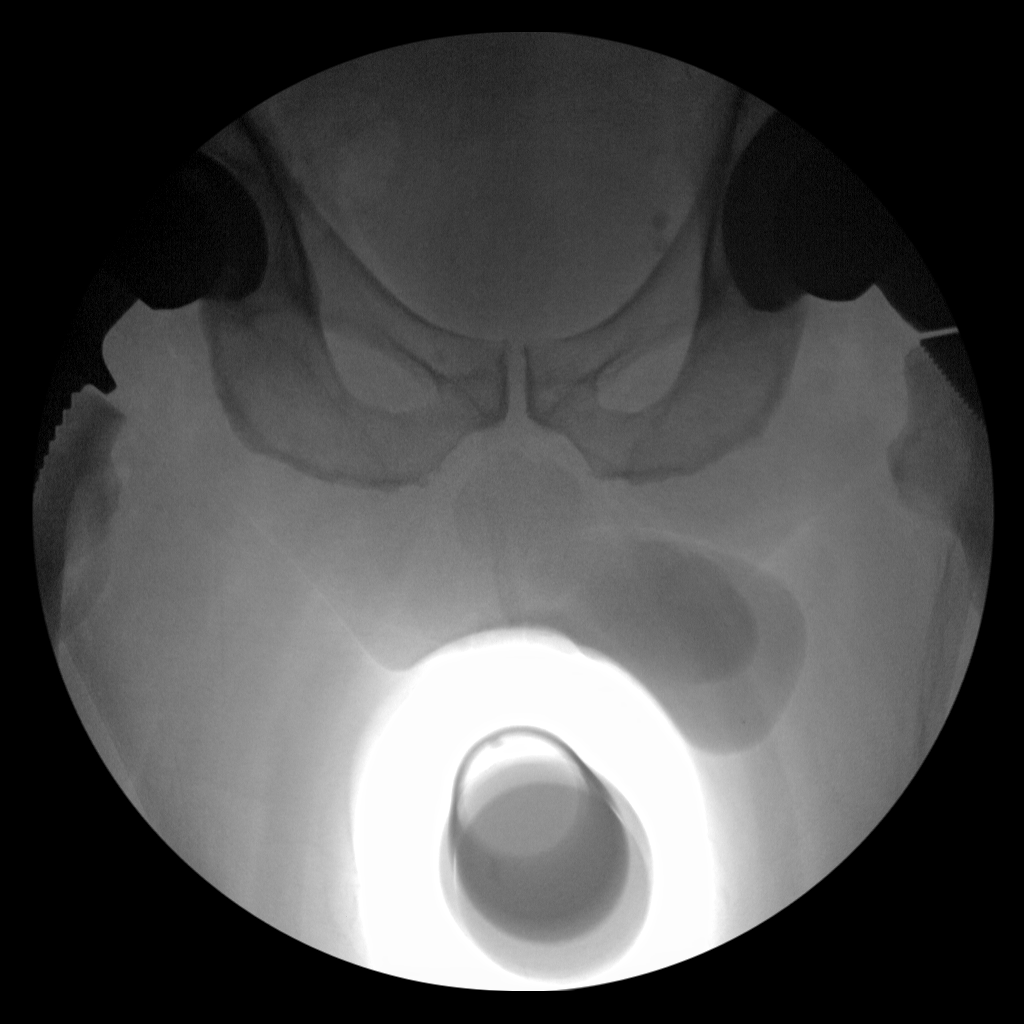

[2 of 2 positions shown; findings below may reference images not displayed]

FINDINGS: Examination demonstrates evidence of a right total hip arthroplasty
with prosthetic components normally located and intact. Left hip
arthroplasty unchanged. Recommend correlation with findings at the
time of the procedure.
IMPRESSION: Evidence of patient's recent right total hip arthroplasty without
complicating features.

## 2017-01-25 DIAGNOSIS — M25561 Pain in right knee: Secondary | ICD-10-CM | POA: Diagnosis not present

## 2017-01-25 DIAGNOSIS — M25562 Pain in left knee: Secondary | ICD-10-CM | POA: Diagnosis not present

## 2017-01-27 DIAGNOSIS — R972 Elevated prostate specific antigen [PSA]: Secondary | ICD-10-CM | POA: Diagnosis not present

## 2017-01-27 DIAGNOSIS — N5201 Erectile dysfunction due to arterial insufficiency: Secondary | ICD-10-CM | POA: Diagnosis not present

## 2017-02-03 DIAGNOSIS — M25561 Pain in right knee: Secondary | ICD-10-CM | POA: Diagnosis not present

## 2017-02-03 DIAGNOSIS — M79671 Pain in right foot: Secondary | ICD-10-CM | POA: Diagnosis not present

## 2017-02-03 DIAGNOSIS — Z96651 Presence of right artificial knee joint: Secondary | ICD-10-CM | POA: Diagnosis not present

## 2017-02-09 DIAGNOSIS — M25561 Pain in right knee: Secondary | ICD-10-CM | POA: Diagnosis not present

## 2017-02-09 DIAGNOSIS — M79671 Pain in right foot: Secondary | ICD-10-CM | POA: Diagnosis not present

## 2017-02-09 DIAGNOSIS — Z96651 Presence of right artificial knee joint: Secondary | ICD-10-CM | POA: Diagnosis not present

## 2017-02-11 ENCOUNTER — Telehealth: Payer: Self-pay | Admitting: Internal Medicine

## 2017-02-11 DIAGNOSIS — M25561 Pain in right knee: Secondary | ICD-10-CM | POA: Diagnosis not present

## 2017-02-11 DIAGNOSIS — Z96651 Presence of right artificial knee joint: Secondary | ICD-10-CM | POA: Diagnosis not present

## 2017-02-11 DIAGNOSIS — M79671 Pain in right foot: Secondary | ICD-10-CM | POA: Diagnosis not present

## 2017-02-11 NOTE — Telephone Encounter (Signed)
Called patient to schedule awv. Lvm for patient to call office to schedule appt.  °

## 2017-02-22 DIAGNOSIS — M25561 Pain in right knee: Secondary | ICD-10-CM | POA: Diagnosis not present

## 2017-02-22 DIAGNOSIS — N183 Chronic kidney disease, stage 3 unspecified: Secondary | ICD-10-CM | POA: Insufficient documentation

## 2017-02-22 DIAGNOSIS — E1122 Type 2 diabetes mellitus with diabetic chronic kidney disease: Secondary | ICD-10-CM | POA: Insufficient documentation

## 2017-02-22 DIAGNOSIS — I129 Hypertensive chronic kidney disease with stage 1 through stage 4 chronic kidney disease, or unspecified chronic kidney disease: Secondary | ICD-10-CM | POA: Insufficient documentation

## 2017-02-22 DIAGNOSIS — M79671 Pain in right foot: Secondary | ICD-10-CM | POA: Diagnosis not present

## 2017-02-22 DIAGNOSIS — Z96651 Presence of right artificial knee joint: Secondary | ICD-10-CM | POA: Diagnosis not present

## 2017-02-22 NOTE — Progress Notes (Signed)
Subjective:    Patient ID: Nathan Gomez, male    DOB: 1941-08-19, 76 y.o.   MRN: [id number]  HPI The patient is here for follow up.  Hypertension: He is taking his medication daily. He is compliant with a low sodium diet.  He denies chest pain, palpitations, edema, shortness of breath and regular headaches. He is exercising regularly, but mostly stretching - he is limited in walking due to b/l knee pain.      Diabetes: He is taking his medication daily as prescribed. He is compliant with a diabetic diet. He is exercising regularly. He monitors his sugars and they have been running 120-125. He checks his feet daily and denies foot lesions. He is up-to-date with an ophthalmology examination.   Hyperlipidemia: He is taking his medication daily. He is compliant with a low fat/cholesterol diet. He is exercising regularly. He denies myalgias.   CKD: he is taking celebrex.  He takes advil on occasion and is unsure how much it helps.  He probably does not drink enough water daily.   Medications and allergies reviewed with patient and updated if appropriate.  Patient Active Problem List   Diagnosis Date Noted  . CKD (chronic kidney disease) 02/22/2017  . Elevated PSA 08/25/2016  . B12 deficiency 01/30/2016  . Urinary retention 07/30/2015  . Primary osteoarthritis of right knee 07/09/2015  . Personal history of colonic adenomas 01/16/2013  . Pernicious anemia 02/22/2012  . Anemia   . Diabetes mellitus type 2, controlled ([place]) 01/05/2011  . Hyperlipidemia 01/05/2011  . Essential hypertension 01/05/2011  . Osteoarthritis 01/05/2011    Current Outpatient Prescriptions on File Prior to Visit  Medication Sig Dispense Refill  . amLODipine (NORVASC) 10 MG tablet Take 1 tablet (10 mg total) by mouth daily. 90 tablet 1  . atorvastatin (LIPITOR) 40 MG tablet TAKE 1/2 TABLET (20 MG     TOTAL) DAILY 45 tablet 2  . blood glucose meter kit and supplies KIT Use to test blood sugar up to twice a  day. DX E11.09 1 each 0  . Cholecalciferol (VITAMIN D) 2000 UNITS CAPS Take 1 capsule (2,000 Units total) by mouth every morning. 90 capsule 3  . Cyanocobalamin (VITAMIN B12 PO) Take 1 tablet by mouth daily.    Marland Kitchen GLUCOSAMINE SULFATE PO Take 1 tablet by mouth daily.    Marland Kitchen glucose blood test strip Use as instructed to test blood sugar up to twice a day. DX:  E11.09 200 each 3  . HYDROcodone-acetaminophen (NORCO) 10-325 MG tablet Take 1-2 tablets by mouth every 4 (four) hours as needed. 50 tablet 0  . Inositol Niacinate (NIACIN FLUSH FREE) 500 MG CAPS Take 1 capsule by mouth at bedtime.      Javier Docker Oil 300 MG CAPS Take 500 mg by mouth daily.     . Lancets 18M MISC Delica Fine Lancets. Use to test blood sugar up to twice a day. DX E11.09 200 each 3  . lisinopril (PRINIVIL,ZESTRIL) 20 MG tablet Take 1 tablet (20 mg total) by mouth daily. 90 tablet 3  . loratadine (CLARITIN) 10 MG tablet Take 1 tablet (10 mg total) by mouth daily. (Patient taking differently: Take 10 mg by mouth daily as needed for allergies. ) 90 tablet 3  . magnesium oxide (MAG-OX) 400 MG tablet Take 1 tablet (400 mg total) by mouth 2 (two) times daily. 180 tablet 3  . metFORMIN (GLUCOPHAGE-XR) 500 MG 24 hr tablet TAKE 2 TABLETS (=1,000MG)  TWO TIMES A  DAY 360 tablet 3  . Multiple Vitamins-Minerals (SENIOR MULTIVITAMIN PLUS) TABS Take 1 tablet by mouth daily.      Marland Kitchen omeprazole (PRILOSEC) 20 MG capsule Take 1 capsule (20 mg total) by mouth at bedtime. 90 capsule 3  . Probiotic Product (PROBIOTIC PO) Take 1 capsule by mouth daily.    . Probiotic Product (SOLUBLE FIBER/PROBIOTICS) CHEW Chew 2 tablets by mouth daily.    . tamsulosin (FLOMAX) 0.4 MG CAPS capsule Take 1 capsule (0.4 mg total) by mouth at bedtime. (Patient taking differently: Take 0.4-0.8 mg by mouth See admin instructions. Take 0.4 mg by mouth daily. Starting 7 days before procedure take 0.64m by mouth daily) 14 capsule 0  . Triamcinolone Acetonide (NASACORT AQ NA) Place 1  spray into the nose daily as needed (allergies).    . [DISCONTINUED] tadalafil (CIALIS) 20 MG tablet Take 0.5-1 tablets (10-20 mg total) by mouth every other day as needed for erectile dysfunction. (Patient not taking: Reported on 06/28/2015) 30 tablet 0   No current facility-administered medications on file prior to visit.     Past Medical History:  Diagnosis Date  . Achilles tendinitis   . Complication of anesthesia    urinary retention  . 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 Diabetes mellitus, type 2 ([place]   . Dyslipidemia   . GERD (gastroesophageal reflux disease)   . Heart murmur    child  . Hypertension   . Osteoarthritis    right knee  . Personal history of colonic adenomas 01/16/2013  . Plantar fasciitis     Past Surgical History:  Procedure Laterality Date  . CATARACT EXTRACTION  08/2012 L, 09/2012 R  . COLONOSCOPY    . TONSILLECTOMY  1945  . TONSILLECTOMY    . TOTAL HIP ARTHROPLASTY  2011   Left  . TOTAL HIP ARTHROPLASTY Right 07/09/2015   Procedure: TOTAL HIP ARTHROPLASTY ANTERIOR APPROACH;  Surgeon: PMelrose Nakayama MD;  [facility]  Service: Orthopedics;  Laterality: Right;  . TOTAL KNEE ARTHROPLASTY Right 07/21/2016   Procedure: RIGHT TOTAL KNEE ARTHROPLASTY;  Surgeon: PMelrose Nakayama MD;  [facility]  Service: Orthopedics;  Laterality: Right;    Social History   Social History  . Marital status: Married    Spouse name: N/A  . Number of children: N/A  . Years of education: N/A   Social History Main Topics  . Smoking status: Former Smoker    Packs/day: 1.00    Years: 20.00    Quit date: 11/16/1986  . Smokeless tobacco: Never Used  . Alcohol use 0.6 - 1.2 oz/week    1 - 2 Glasses of wine per week  . Drug use: No  . Sexual activity: Not on file   Other Topics Concern  . Not on file   Social History Narrative  . No narrative on file    Family History  Problem Relation Age of Onset  . Ovarian cancer Mother   . Hypertension  Father   . Diabetes Maternal Grandfather   . Pneumonia Paternal Grandfather     Review of Systems  Constitutional: Negative for chills and fever.  Respiratory: Negative for cough, shortness of breath and wheezing.   Cardiovascular: Positive for leg swelling. Negative for chest pain and palpitations.  Neurological: Negative for light-headedness and headaches.       Objective:   Vitals:   02/23/17 0806  BP: 120/66  Pulse: 71  Resp: 16  Temp: 98 F (36.7 C)   Wt  Readings from Last 3 Encounters:  02/23/17 190 lb (86.2 kg)  08/25/16 190 lb (86.2 kg)  07/21/16 198 lb 4.8 oz (89.9 kg)   Body mass index is 25.77 kg/m.   Physical Exam    Constitutional: Appears well-developed and well-nourished. No distress.  HENT:  Head: Normocephalic and atraumatic.  Neck: Neck supple. No tracheal deviation present. No thyromegaly present.  No cervical lymphadenopathy Cardiovascular: Normal rate, regular rhythm and normal heart sounds.   No murmur heard. No carotid bruit .  No edema Pulmonary/Chest: Effort normal and breath sounds normal. No respiratory distress. No has no wheezes. No rales.  Skin: Skin is warm and dry. Not diaphoretic.  Psychiatric: Normal mood and affect. Behavior is normal.      Assessment & Plan:    See Problem List for Assessment and Plan of chronic medical problems.

## 2017-02-23 ENCOUNTER — Ambulatory Visit (INDEPENDENT_AMBULATORY_CARE_PROVIDER_SITE_OTHER): Payer: Medicare Other | Admitting: Internal Medicine

## 2017-02-23 ENCOUNTER — Encounter: Payer: Self-pay | Admitting: Internal Medicine

## 2017-02-23 ENCOUNTER — Other Ambulatory Visit (INDEPENDENT_AMBULATORY_CARE_PROVIDER_SITE_OTHER): Payer: Medicare Other

## 2017-02-23 VITALS — BP 120/66 | HR 71 | Temp 98.0°F | Resp 16 | Ht 72.0 in | Wt 190.0 lb

## 2017-02-23 DIAGNOSIS — E538 Deficiency of other specified B group vitamins: Secondary | ICD-10-CM | POA: Diagnosis not present

## 2017-02-23 DIAGNOSIS — I10 Essential (primary) hypertension: Secondary | ICD-10-CM | POA: Diagnosis not present

## 2017-02-23 DIAGNOSIS — N189 Chronic kidney disease, unspecified: Secondary | ICD-10-CM | POA: Diagnosis not present

## 2017-02-23 DIAGNOSIS — E119 Type 2 diabetes mellitus without complications: Secondary | ICD-10-CM

## 2017-02-23 DIAGNOSIS — E7800 Pure hypercholesterolemia, unspecified: Secondary | ICD-10-CM

## 2017-02-23 LAB — COMPREHENSIVE METABOLIC PANEL
ALT: 11 U/L (ref 0–53)
AST: 12 U/L (ref 0–37)
Albumin: 4.2 g/dL (ref 3.5–5.2)
Alkaline Phosphatase: 92 U/L (ref 39–117)
BUN: 25 mg/dL — ABNORMAL HIGH (ref 6–23)
CO2: 24 mEq/L (ref 19–32)
Calcium: 8.6 mg/dL (ref 8.4–10.5)
Chloride: 108 mEq/L (ref 96–112)
Creatinine, Ser: 1.23 mg/dL (ref 0.40–1.50)
GFR: 60.51 mL/min (ref >60.00)
Glucose, Bld: 128 mg/dL — ABNORMAL HIGH (ref 70–99)
Potassium: 4.7 mEq/L (ref 3.5–5.1)
Sodium: 140 mEq/L (ref 135–145)
Total Bilirubin: 0.4 mg/dL (ref 0.2–1.2)
Total Protein: 6.5 g/dL (ref 6.0–8.3)

## 2017-02-23 LAB — HEMOGLOBIN A1C: Hgb A1c MFr Bld: 6.7 % — ABNORMAL HIGH (ref 4.6–6.5)

## 2017-02-23 LAB — LIPID PANEL
Cholesterol: 115 mg/dL (ref 0–200)
HDL: 30 mg/dL — ABNORMAL LOW (ref >39.00)
LDL Cholesterol: 56 mg/dL (ref 0–99)
NonHDL: 84.72
Total CHOL/HDL Ratio: 4
Triglycerides: 142 mg/dL (ref 0.0–149.0)
VLDL: 28.4 mg/dL (ref 0.0–40.0)

## 2017-02-23 LAB — MAGNESIUM: Magnesium: 1.1 mg/dL — ABNORMAL LOW (ref 1.5–2.5)

## 2017-02-23 LAB — VITAMIN B12: Vitamin B-12: 495 pg/mL (ref 211–911)

## 2017-02-23 NOTE — Patient Instructions (Addendum)
  Test(s) ordered today. Your results will be released to [facility] (or called to you) after review, usually within 72hours after test completion. If any changes need to be made, you will be notified at that same time.  All other Health Maintenance issues reviewed.   All recommended immunizations and age-appropriate screenings are up-to-date or discussed.  No immunizations administered today.   Medications reviewed and updated.  Changes include avoid celebrex and other anti-inflammatories (advil, aleve)    Please followup in 6 months

## 2017-02-23 NOTE — Assessment & Plan Note (Signed)
BP well controlled Current regimen effective and well tolerated Continue current medications at current doses cmp  

## 2017-02-23 NOTE — Assessment & Plan Note (Signed)
Check a1c Sugars well controlled at home Continue current medication

## 2017-02-23 NOTE — Assessment & Plan Note (Signed)
Taking b12 Check level 

## 2017-02-23 NOTE — Assessment & Plan Note (Signed)
cmp today Avoid nsaids Increase water intake

## 2017-02-23 NOTE — Assessment & Plan Note (Signed)
Check lipid panel  Continue daily statin Regular exercise and healthy diet encouraged  

## 2017-02-24 ENCOUNTER — Ambulatory Visit: Payer: Medicare Other | Admitting: Family Medicine

## 2017-02-25 DIAGNOSIS — M25561 Pain in right knee: Secondary | ICD-10-CM | POA: Diagnosis not present

## 2017-02-25 DIAGNOSIS — Z96651 Presence of right artificial knee joint: Secondary | ICD-10-CM | POA: Diagnosis not present

## 2017-02-25 DIAGNOSIS — M79671 Pain in right foot: Secondary | ICD-10-CM | POA: Diagnosis not present

## 2017-02-26 ENCOUNTER — Encounter: Payer: Self-pay | Admitting: Internal Medicine

## 2017-02-26 ENCOUNTER — Other Ambulatory Visit: Payer: Self-pay | Admitting: Internal Medicine

## 2017-02-27 ENCOUNTER — Encounter: Payer: Self-pay | Admitting: Internal Medicine

## 2017-03-01 DIAGNOSIS — Z96651 Presence of right artificial knee joint: Secondary | ICD-10-CM | POA: Diagnosis not present

## 2017-03-01 DIAGNOSIS — M25571 Pain in right ankle and joints of right foot: Secondary | ICD-10-CM | POA: Diagnosis not present

## 2017-03-01 DIAGNOSIS — M79671 Pain in right foot: Secondary | ICD-10-CM | POA: Diagnosis not present

## 2017-03-01 DIAGNOSIS — M25561 Pain in right knee: Secondary | ICD-10-CM | POA: Diagnosis not present

## 2017-03-04 DIAGNOSIS — M79671 Pain in right foot: Secondary | ICD-10-CM | POA: Diagnosis not present

## 2017-03-04 DIAGNOSIS — Z96651 Presence of right artificial knee joint: Secondary | ICD-10-CM | POA: Diagnosis not present

## 2017-03-04 DIAGNOSIS — M25561 Pain in right knee: Secondary | ICD-10-CM | POA: Diagnosis not present

## 2017-03-16 DIAGNOSIS — Z96651 Presence of right artificial knee joint: Secondary | ICD-10-CM | POA: Diagnosis not present

## 2017-03-16 DIAGNOSIS — M79671 Pain in right foot: Secondary | ICD-10-CM | POA: Diagnosis not present

## 2017-03-16 DIAGNOSIS — M25561 Pain in right knee: Secondary | ICD-10-CM | POA: Diagnosis not present

## 2017-03-18 DIAGNOSIS — M79671 Pain in right foot: Secondary | ICD-10-CM | POA: Diagnosis not present

## 2017-03-18 DIAGNOSIS — M25561 Pain in right knee: Secondary | ICD-10-CM | POA: Diagnosis not present

## 2017-03-18 DIAGNOSIS — Z96651 Presence of right artificial knee joint: Secondary | ICD-10-CM | POA: Diagnosis not present

## 2017-03-22 DIAGNOSIS — Z96651 Presence of right artificial knee joint: Secondary | ICD-10-CM | POA: Diagnosis not present

## 2017-03-22 DIAGNOSIS — M25561 Pain in right knee: Secondary | ICD-10-CM | POA: Diagnosis not present

## 2017-03-22 DIAGNOSIS — M79671 Pain in right foot: Secondary | ICD-10-CM | POA: Diagnosis not present

## 2017-04-05 DIAGNOSIS — M79671 Pain in right foot: Secondary | ICD-10-CM | POA: Diagnosis not present

## 2017-04-05 DIAGNOSIS — M25561 Pain in right knee: Secondary | ICD-10-CM | POA: Diagnosis not present

## 2017-04-05 DIAGNOSIS — Z96651 Presence of right artificial knee joint: Secondary | ICD-10-CM | POA: Diagnosis not present

## 2017-04-08 ENCOUNTER — Ambulatory Visit (INDEPENDENT_AMBULATORY_CARE_PROVIDER_SITE_OTHER): Payer: Medicare Other | Admitting: Family Medicine

## 2017-04-08 ENCOUNTER — Encounter: Payer: Self-pay | Admitting: Family Medicine

## 2017-04-08 VITALS — BP 106/66 | HR 70 | Temp 98.8°F | Ht 72.0 in | Wt 194.0 lb

## 2017-04-08 DIAGNOSIS — S86011S Strain of right Achilles tendon, sequela: Secondary | ICD-10-CM | POA: Insufficient documentation

## 2017-04-08 DIAGNOSIS — D649 Anemia, unspecified: Secondary | ICD-10-CM

## 2017-04-08 DIAGNOSIS — D510 Vitamin B12 deficiency anemia due to intrinsic factor deficiency: Secondary | ICD-10-CM | POA: Diagnosis not present

## 2017-04-08 DIAGNOSIS — N189 Chronic kidney disease, unspecified: Secondary | ICD-10-CM

## 2017-04-08 DIAGNOSIS — E7800 Pure hypercholesterolemia, unspecified: Secondary | ICD-10-CM | POA: Diagnosis not present

## 2017-04-08 DIAGNOSIS — R972 Elevated prostate specific antigen [PSA]: Secondary | ICD-10-CM

## 2017-04-08 DIAGNOSIS — I10 Essential (primary) hypertension: Secondary | ICD-10-CM | POA: Diagnosis not present

## 2017-04-08 DIAGNOSIS — E119 Type 2 diabetes mellitus without complications: Secondary | ICD-10-CM

## 2017-04-08 DIAGNOSIS — M1711 Unilateral primary osteoarthritis, right knee: Secondary | ICD-10-CM

## 2017-04-08 LAB — POCT UA - MICROALBUMIN
Albumin/Creatinine Ratio, Urine, POC: <30
Creatinine, POC: 300 mg/dL
Microalbumin Ur, POC: 10 mg/L

## 2017-04-08 MED ORDER — MELOXICAM 15 MG PO TABS: 15.0000 mg | ORAL_TABLET | Freq: Every day | ORAL | 0 refills | Status: DC

## 2017-04-08 NOTE — Assessment & Plan Note (Signed)
Per u rology 

## 2017-04-08 NOTE — Progress Notes (Signed)
New patient office visit note:  Impression and Recommendations:    1. Controlled type 2 diabetes mellitus without complication, without long-term current use of insulin ([facility])   2. Chronic kidney disease, unspecified CKD stage   3. Elevated PSA   4. Ruptured, tendon, Achilles, right, sequela   5. Pure hypercholesterolemia   6. Essential hypertension   7. Primary osteoarthritis of right knee   8. Anemia, unspecified type   9. Pernicious anemia   10. Hypomagnesemia     When dealing with chronic kidney disease due to diabetes, or blood pressure problems or age related changes, the best thing you could do a slow the progression of kidney disease.  The best thing to do is drink half of your weight in ounces of water per day-keep hydrated.  Also try to exercise to improve blood flow to the kidneys.  This includes 30 minutes of speed walking 5 days a week or more.  Also it's very important to control your blood pressure and blood sugars as when does get worse your kidneys will get worse.   Try to take your magnesium with food every time.  Also cut the tablet in half and take half of it in the morning and half of it in the evening.  I also recommend you try to go somewhere like whole foods or a natural foods store to buy a higher quality of magnesium supplement as well.   Once you are consistently taking it--> reck in 6wks.    Diabetes mellitus type 2, controlled ([facility]) Urine micral Biancardi ratio obtained today.  Reviewed recent CMP results; and A1c.- Well-controlled.  Continue to monitor, prudent diet, exercise to goal 150 minutes weekly.  Bring in blood sugar log next office visit.    Hyperlipidemia Lipids recently checked about a month ago.    Well controlled.LDL at goal 56.  Essential hypertension Very well controlled.  Patient is a symptomatic.  Continue lisinopril for renovascular protection effects.    CKD (chronic kidney disease) Reviewed with patient that serum  creatinine was 1.21 back in 2011, and only 1.23 today.  This is very good.  Explained to him that recent surgery 79 months ago were likely the cause of raising his serum creatinine over 1.5.  GFR was diminished at that time and now over 60 and last blood work done approximately one month ago.  Obtain CMP with GFR in 2 months.  Osteoarthritis Debilitating pain is making patient unable to exercise.  Patient thinks that Advil and NSAIDs were not reason for his renal dysfunction recently,   will trial mobic  Recheck CMP 2 months.  Ruptured, tendon, Achilles, right, sequela Per Ortho  Hypomagnesemia Discussed to take magnesium supplements with food.  Handouts provided regarding other issues that can be causing diarrhea with his magnesium supplements.  Anemia CBC stable last checked, will check 2 mo  Pernicious anemia On B12-- recently checked in April, labs stable.    Continue supplementation.  Primary osteoarthritis of right knee Encouraged patient to go to the [facility] and do water aerobics and/or biking rather than weightbearing exercises.  Elevated PSA Per urology.   The patient was counseled, risk factors were discussed, anticipatory guidance given.   Meds ordered this encounter  Medications  . meloxicam (MOBIC) 15 MG tablet    Sig: Take 1 tablet (15 mg total) by mouth daily.    Dispense:  30 tablet    Refill:  0    Orders Placed This Encounter  Procedures  .  CBC with Differential/Platelet  . Comprehensive metabolic panel  . Hemoglobin A1c  . Magnesium  . Phosphorus  . T4, free  . TSH  . VITAMIN D 25 Hydroxy (Vit-D Deficiency, Fractures)  . POCT UA - Microalbumin     Gross side effects, risk and benefits, and alternatives of medications discussed with patient.  Patient is aware that all medications have potential side effects and we are unable to predict every side effect or drug-drug interaction that may occur.  Expresses verbal understanding and consents to  current therapy plan and treatment regimen.  Return in about 8 weeks (around 06/03/2017) for f/up 2 mo or so--.  Please see AVS handed out to patient at the end of our visit for further patient instructions/ counseling done pertaining to today's office visit.    Note: This document was prepared using Dragon voice recognition software and may include unintentional dictation errors.  ----------------------------------------------------------------------------------------------------------------------    Subjective:    Chief complaint:   Chief Complaint  Patient presents with  . Establish Care  . Magnesium Deficiency    diarrhea  . Chronic Kidney Disease    needs referral     HPI: [facility] is a pleasant 76 y.o. male who presents to [facility] today to review their medical history with me and establish care.   I asked the patient to review their chronic problem list with me to ensure everything was updated and accurate.    All recent office visits with other providers, any medical records that patient brought in etc  - I reviewed today.     Also asked pt to get me medical records from [facility] providers/ specialists that they had seen within the past 3-5 years- if they are in private practice and/or do not work for a [employer], [facility], [facility], [facility] or [employer] owned practice.  Told them to call their specialists to clarify this if they are not sure.   Goes to [employer].     DM- 20 yrs;    DM HPI:  ---> Pt worried he has a serum Crt =  7 /11 /11  Was 1.2   ;   Now 02/23/17 was 1.23  -  He has been working on diet and exercise for diabetes=- keeping wt down  A1c 6.5--6.7 and well controlled many years.  ONly metformin,  No DM Retinopathy, no DM neuropathy, + proteinuria and renal manifestations   Pt is currently maintained on the following medications for diabetes:   see med list today  Medication compliance -  good  Home glucose  readings range 120-150's FBS   Denies polyuria/polydipsia.  Denies hypo/ hyperglycemia symptoms  Complications:  n polyneuropathy, n proliferative retinopathy, n CAD, n PAD, n TIA n   Pancreatitis:    Never  Last diabetic eye exam was  Lab Results  Component Value Date   HMDIABEYEEXA No Retinopathy 04/20/2016    Foot exam- UTD  Last A1C in the office was:  Lab Results  Component Value Date   HGBA1C 6.7 (H) 02/23/2017   HGBA1C 6.8 (H) 08/25/2016   HGBA1C 6.9 (H) 07/10/2016    Lab Results  Component Value Date   MICROALBUR 10 04/08/2017   LDLCALC 56 02/23/2017   CREATININE 1.23 02/23/2017     Wt Readings from Last 3 Encounters:  04/08/17 194 lb (88 kg)  02/23/17 190 lb (86.2 kg)  08/25/16 190 lb (86.2 kg)   BP Readings from Last 3  Encounters:  04/08/17 106/66  02/23/17 120/66  08/25/16 130/84   Pulse Readings from Last 3 Encounters:  04/08/17 70  02/23/17 71  08/25/16 75   BMI Readings from Last 3 Encounters:  04/08/17 26.31 kg/m  02/23/17 25.77 kg/m  08/25/16 25.77 kg/m    Patient Care Team    Relationship Specialty Notifications Start End  Mellody Dance, DO PCP - General Family Medicine  04/08/17   Gatha Mayer, MD Consulting Physician Gastroenterology  09/28/11   Darleen Crocker, MD  Ophthalmology  09/16/12   Melrose Nakayama, MD  Orthopedic Surgery  12/05/13   Kathie Rhodes, MD  Urology  07/30/15   Katy Apo, MD Consulting Physician Ophthalmology  04/08/17     Patient Active Problem List   Diagnosis Date Noted  . CKD (chronic kidney disease) 02/22/2017    Priority: High  . Diabetes mellitus type 2, controlled ([place]) 01/05/2011    Priority: High  . Hyperlipidemia 01/05/2011    Priority: High  . Essential hypertension 01/05/2011    Priority: High  . Osteoarthritis 01/05/2011    Priority: Low  . Ruptured, tendon, Achilles, right, sequela 04/08/2017  . Elevated PSA 08/25/2016  . Hypomagnesemia 04/08/2016  . B12 deficiency 01/30/2016    . Urinary retention 07/30/2015  . Primary osteoarthritis of right knee 07/09/2015  . Personal history of colonic adenomas 01/16/2013  . Pernicious anemia 02/22/2012  . Anemia      Past Medical History:  Diagnosis Date  . Achilles tendinitis   . Complication of anesthesia    urinary retention  . 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 Diabetes mellitus, type 2 ([place])   . Dyslipidemia   . GERD (gastroesophageal reflux disease)   . Heart murmur    child  . Hypertension   . Osteoarthritis    right knee  . Personal history of colonic adenomas 01/16/2013  . Plantar fasciitis      Past Medical History:  Diagnosis Date  . Achilles tendinitis   . Complication of anesthesia    urinary retention  . 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 Diabetes mellitus, type 2 ([place])   . Dyslipidemia   . GERD (gastroesophageal reflux disease)   . Heart murmur    child  . Hypertension   . Osteoarthritis    right knee  . Personal history of colonic adenomas 01/16/2013  . Plantar fasciitis      Past Surgical History:  Procedure Laterality Date  . CATARACT EXTRACTION  08/2012 L, 09/2012 R  . COLONOSCOPY    . TONSILLECTOMY  1945  . TONSILLECTOMY    . TOTAL HIP ARTHROPLASTY  2011   Left  . TOTAL HIP ARTHROPLASTY Right 07/09/2015   Procedure: TOTAL HIP ARTHROPLASTY ANTERIOR APPROACH;  Surgeon: Melrose Nakayama, MD;  Location: [place];  Service: Orthopedics;  Laterality: Right;  . TOTAL KNEE ARTHROPLASTY Right 07/21/2016   Procedure: RIGHT TOTAL KNEE ARTHROPLASTY;  Surgeon: Melrose Nakayama, MD;  Location: [place];  Service: Orthopedics;  Laterality: Right;     Family History  Problem Relation Age of Onset  . Ovarian cancer Mother   . Hypertension Father   . Diabetes Maternal Grandfather   . Pneumonia Paternal Grandfather      History  Drug Use No     History  Alcohol Use  . 0.6 - 1.2 oz/week  . 1 - 2 Glasses of wine per week     History  Smoking Status  .  Former Smoker  .  Packs/day: 1.00  . Years: 20.00  . Quit date: 11/16/1986  Smokeless Tobacco  . Never Used     Outpatient Encounter Prescriptions as of 04/08/2017  Medication Sig  . amLODipine (NORVASC) 10 MG tablet Take 1 tablet (10 mg total) by mouth daily.  Marland Kitchen atorvastatin (LIPITOR) 40 MG tablet TAKE 1/2 TABLET (20 MG     TOTAL) DAILY  . blood glucose meter kit and supplies KIT Use to test blood sugar up to twice a day. DX E11.09  . Cholecalciferol (VITAMIN D) 2000 UNITS CAPS Take 1 capsule (2,000 Units total) by mouth every morning.  . Cyanocobalamin (VITAMIN B12 PO) Take 1 tablet by mouth daily.  Marland Kitchen GLUCOSAMINE SULFATE PO Take 1 tablet by mouth daily.  Marland Kitchen glucose blood test strip Use as instructed to test blood sugar up to twice a day. DX:  E11.09  . hydrochlorothiazide (HYDRODIURIL) 25 MG tablet Take 25 mg by mouth daily.  . Inositol Niacinate (NIACIN FLUSH FREE) 500 MG CAPS Take 1 capsule by mouth at bedtime.    Javier Docker Oil 300 MG CAPS Take 500 mg by mouth daily.   . Lancets 95J MISC Delica Fine Lancets. Use to test blood sugar up to twice a day. DX E11.09  . lisinopril (PRINIVIL,ZESTRIL) 20 MG tablet Take 1 tablet (20 mg total) by mouth daily.  . magnesium oxide (MAG-OX) 400 MG tablet Take 1 tablet (400 mg total) by mouth 2 (two) times daily.  . metFORMIN (GLUCOPHAGE-XR) 500 MG 24 hr tablet TAKE 2 TABLETS (=1,000MG)  TWO TIMES A DAY  . Multiple Vitamins-Minerals (SENIOR MULTIVITAMIN PLUS) TABS Take 1 tablet by mouth daily.    Marland Kitchen omeprazole (PRILOSEC) 20 MG capsule Take 1 capsule (20 mg total) by mouth at bedtime.  . Probiotic Product (SOLUBLE FIBER/PROBIOTICS) CHEW Chew 2 tablets by mouth daily.  . tamsulosin (FLOMAX) 0.4 MG CAPS capsule Take 1 capsule (0.4 mg total) by mouth at bedtime. (Patient taking differently: Take 0.4-0.8 mg by mouth See admin instructions. Take 0.4 mg by mouth daily. Starting 7 days before procedure take 0.49m by mouth daily)  . Triamcinolone Acetonide (NASACORT  AQ NA) Place 1 spray into the nose daily as needed (allergies).  . loratadine (CLARITIN) 10 MG tablet Take 1 tablet (10 mg total) by mouth daily. (Patient taking differently: Take 10 mg by mouth daily as needed for allergies. )  . meloxicam (MOBIC) 15 MG tablet Take 1 tablet (15 mg total) by mouth daily.   No facility-administered encounter medications on file as of 04/08/2017.     Allergies: Nsaids; Sulfa antibiotics; and Thiazide-type diuretics   Review of Systems  Constitutional: Negative for chills, diaphoresis, fever, malaise/fatigue and weight loss.  HENT: Negative for congestion, sore throat and tinnitus.   Eyes: Negative for blurred vision, double vision and photophobia.  Respiratory: Negative for cough and wheezing.   Cardiovascular: Negative for chest pain and palpitations.  Gastrointestinal: Negative for blood in stool, diarrhea, nausea and vomiting.  Genitourinary: Negative for dysuria, frequency and urgency.  Musculoskeletal: Positive for back pain and joint pain. Negative for myalgias.       Chronic   Skin: Negative for itching and rash.  Neurological: Negative for dizziness, focal weakness, weakness and headaches.  Endo/Heme/Allergies: Negative for environmental allergies and polydipsia. Does not bruise/bleed easily.  Psychiatric/Behavioral: Negative for depression and memory loss. The patient is not nervous/anxious and does not have insomnia.      Objective:   Blood pressure 106/66, pulse 70, temperature 98.8 F (37.1  C), temperature source Oral, height 6' (1.829 m), weight 194 lb (88 kg), SpO2 97 %. Body mass index is 26.31 kg/m. General: Well Developed, well nourished, and in no acute distress.  Neuro: Alert and oriented x3, extra-ocular muscles intact, sensation grossly intact.  HEENT:[place]/AT, PERRLA, neck supple, No carotid bruits Skin: no gross rashes  Cardiac: Regular rate and rhythm Respiratory: Essentially clear to auscultation bilaterally. Not using  accessory muscles, speaking in full sentences.  Abdominal: not grossly distended Musculoskeletal: Ambulates w/o diff, FROM * 4 ext.  Vasc: less 2 sec cap RF, warm and pink, mild 1+ nonpitting edema equal bilaterally. Psych:  No HI/SI, judgement and insight good, Euthymic mood. Full Affect.    Recent Results (from the past 2160 hour(s))  Magnesium     Status: Abnormal   Collection Time: 02/23/17  8:37 AM  Result Value Ref Range   Magnesium 1.1 (L) 1.5 - 2.5 mg/dL  Vitamin B12     Status: None   Collection Time: 02/23/17  8:37 AM  Result Value Ref Range   Vitamin B-12 495 211 - 911 pg/mL  Lipid panel     Status: Abnormal   Collection Time: 02/23/17  8:37 AM  Result Value Ref Range   Cholesterol 115 0 - 200 mg/dL    Comment: ATP III Classification       Desirable:  < 200 mg/dL               Borderline High:  200 - 239 mg/dL          High:  > = 240 mg/dL   Triglycerides 142.0 0.0 - 149.0 mg/dL    Comment: Normal:  <150 mg/dLBorderline High:  150 - 199 mg/dL   HDL 30.00 (L) >39.00 mg/dL   VLDL 28.4 0.0 - 40.0 mg/dL   LDL Cholesterol 56 0 - 99 mg/dL   Total CHOL/HDL Ratio 4     Comment:                Men          Women1/2 Average Risk     3.4          3.3Average Risk          5.0          4.42X Average Risk          9.6          7.13X Average Risk          15.0          11.0                       NonHDL 84.72     Comment: NOTE:  Non-HDL goal should be 30 mg/dL higher than patient's LDL goal (i.e. LDL goal of < 70 mg/dL, would have non-HDL goal of < 100 mg/dL)  Hemoglobin A1c     Status: Abnormal   Collection Time: 02/23/17  8:37 AM  Result Value Ref Range   Hgb A1c MFr Bld 6.7 (H) 4.6 - 6.5 %    Comment: Glycemic Control Guidelines for People with Diabetes:Non Diabetic:  <6%Goal of Therapy: <7%Additional Action Suggested:  >8%   Comprehensive metabolic panel     Status: Abnormal   Collection Time: 02/23/17  8:37 AM  Result Value Ref Range   Sodium 140 135 - 145 mEq/L   Potassium  4.7 3.5 - 5.1 mEq/L   Chloride 108 96 - 112 mEq/L  CO2 24 19 - 32 mEq/L   Glucose, Bld 128 (H) 70 - 99 mg/dL   BUN 25 (H) 6 - 23 mg/dL   Creatinine, Ser 1.23 0.40 - 1.50 mg/dL   Total Bilirubin 0.4 0.2 - 1.2 mg/dL   Alkaline Phosphatase 92 39 - 117 U/L   AST 12 0 - 37 U/L   ALT 11 0 - 53 U/L   Total Protein 6.5 6.0 - 8.3 g/dL   Albumin 4.2 3.5 - 5.2 g/dL   Calcium 8.6 8.4 - 10.5 mg/dL   GFR 60.51 >60.00 mL/min  POCT UA - Microalbumin     Status: Normal   Collection Time: 04/08/17  2:50 PM  Result Value Ref Range   Microalbumin Ur, POC 10 mg/L   Creatinine, POC 300 mg/dL   Albumin/Creatinine Ratio, Urine, POC <30

## 2017-04-08 NOTE — Assessment & Plan Note (Signed)
Urine micral Biancardi ratio obtained today.  Reviewed recent CMP results; and A1c.- Well-controlled.  Continue to monitor, prudent diet, exercise to goal 150 minutes weekly.  Bring in blood sugar log next office visit.

## 2017-04-08 NOTE — Assessment & Plan Note (Signed)
Very well controlled.  Patient is a symptomatic.  Continue lisinopril for renovascular protection effects.

## 2017-04-08 NOTE — Assessment & Plan Note (Signed)
Encouraged patient to go to the [facility] and do water aerobics and/or biking rather than weightbearing exercises.

## 2017-04-08 NOTE — Assessment & Plan Note (Signed)
Per Ortho

## 2017-04-08 NOTE — Patient Instructions (Addendum)
When dealing with chronic kidney disease due to diabetes, or blood pressure problems or age related changes, the best thing you could do a slow the progression of kidney disease.  The best thing to do is drink half of your weight in ounces of water per day-keep hydrated.  Also try to exercise to improve blood flow to the kidneys.  This includes 30 minutes of speed walking 5 days a week or more.  Also it's very important to control your blood pressure and blood sugars as when does get worse your kidneys will get worse.   Try to take your magnesium with food every time.  Also cut the tablet in half and take half of it in the morning and half of it in the evening.  I also recommend you try to go somewhere like whole foods or a natural foods store to buy a higher quality of magnesium supplement as well.   Once you are consistently taking it--> reck in 6wks.        Preventing Chronic Kidney Disease Chronic kidney disease (CKD) occurs when the kidneys are damaged for at least 3 months and do not function effectively. The kidneys are two organs that do many important jobs in the body, including:  Removing waste and extra fluids from the blood.  Regulating hormones, blood pressure, and blood chemistry. At first, you may not notice any signs or symptoms of CKD. However, CKD gets worse over time (is progressive). You can prevent CKD or keep CKD from progressing by making certain changes to your lifestyle and nutrition. Risk factorsfor CKD include:  Being age 10 or older.  Being male.  Having any of the following:  Diabetes.  High blood pressure.  Heart disease.  An autoimmune disease.  Frequent urinary tract infections.  Polycystic kidney disease.  A family history of kidney disease, heart disease, diabetes, or high blood pressure.  Having problems with urine flow that may be caused by:  Cancer.  Having kidney stones more than once.  An enlarged prostate in males.  Being of  African-American, Native American, Hispanic, Asian, or [place] descent.  Being obese.  Long-term use of NSAIDs.  Current or former tobacco use. What types of nutrition changes can I make? A balanced meal plan can help keep your kidneys healthy and prevent CKD. A balanced meal plan may involve:  Limiting salt (sodium) intake. You should have less than 1 tsp (2,300 mg) of sodium per day. If you have heart disease or high blood pressure, you should have less than  tsp (1,500 mg) of sodium per day.  Limiting alcohol intake to no more than 1 drink a day for nonpregnant women and 2 drinks a day for men. One drink equals 12 oz of beer, 5 oz of wine, or 1 oz of hard liquor. If you have diabetes, work with a Financial planner (Firefighter) or a certified diabetes educator to develop a healthy eating plan. What types of lifestyle changes can I make? Lifestyle changes that can help prevent CKD include:  Talking with your health care provider about how much fluid you should drink each day.  Working with your health care provider to manage your blood pressure. This includes healthy eating, regular exercise, and taking any medicines that are prescribed for you.  Exercisingfor at least 30 minutes on five or more days of the week, or as much as told by your health care provider.  Keeping your weight at a healthy level. If you are overweight  or obese, lose weight as told by your health care provider.  Not smoking or using any tobacco products, such as cigarettes, chewing tobacco, and e-cigarettes. If you need help quitting, ask your health care provider.  Having a yearly physical exam.  Learning about your family's medical history. Talk to your relatives and siblings about diabetes, heart disease, and high blood pressure.  Using NSAIDs for pain only when necessary. Ask your health care provider about other pain medicines that do not increase your risk of developing CKD. If you  have diabetes, managing your diabetes with a healthy meal plan and physical activity can help prevent CKD. Work with your health care provider to manage your condition. What can happen if changes are not made? If you do not make lifestyle and nutrition changes to protect your kidneys, you may develop CKD, which can lead to:  A low red blood cell count (anemia).  Heart disease.  Weak bones.  Nerve damage (neuropathy).  Stroke.  Kidney failure and dialysis. What can I do to lower my risk? Talk to your health care provider about your kidney health and your risk factors for CKD. The most important steps to take to lower your risk of CKD are:  Getting high blood pressure down to the target that your health care provider recommends.  Getting blood sugar (glucose) levels down to the target that your health care provider recommends.  Eating less sodium. What are my treatment options for CKD?   Treatment for CKD may include:  Medicines that:  Control high blood pressure, such as ACE inhibitors.  Control blood glucose levels.  Control cholesterol levels.  Help your body get rid of excess water (diuretics).  Help protect your bones.  Lifestyle changes. You may need to:  Eat less salt.  Eat less protein.  Follow a heart-healthy meal plan.  Quit smoking.  Avoid phosphorus. Phosphorous is found in dark-colored sodas and canned ice teas.  Avoid potassium. Potassium is found in some juices, especially orange juice.  Avoid alcohol.  Dialysis. This is when a machine filters your blood if your kidney fails.  Kidney transplant, if your kidney fails. Where can I get more information? Learn more about CKD and how to prevent CKD from:  [facility] [place]: www.kidney.org  The American Association of Kidney Patients: BombTimer.gl  The American Diabetes Association: www.diabetes.org Summary You may not notice symptoms of CKD until your kidneys have already been  damaged. The best way to prevent kidney damage is to know your risk factors and make nutrition and lifestyle changes before you develop symptoms of CKD. This information is not intended to replace advice given to you by your health care provider. Make sure you discuss any questions you have with your health care provider. Document Released: 11/29/2015 Document Revised: 07/15/2016 Document Reviewed: 09/30/2015 Elsevier Interactive Patient Education  2017 [place].       Please realize, EXERCISE IS MEDICINE!  -  American Heart Association ( AHA) guidelines for exercise : If you are in good health, without any medical conditions, you should engage in 150 minutes of moderate intensity aerobic activity per week.  This means you should be huffing and puffing throughout your workout.   Engaging in regular exercise will improve brain function and memory, as well as improve mood, boost immune system and help with weight management.  As well as the other, more well-known effects of exercise such as decreasing blood sugar levels, decreasing blood pressure,  and decreasing bad cholesterol levels/  increasing good cholesterol levels.     -  The AHA strongly endorses consumption of a diet that contains a variety of foods from all the food categories with an emphasis on fruits and vegetables; fat-free and low-fat dairy products; cereal and grain products; legumes and nuts; and fish, poultry, and/or extra lean meats.    Excessive food intake, especially of foods high in saturated and trans fats, sugar, and salt, should be avoided.    Adequate water intake of roughly 1/2 of your weight in pounds, should equal the ounces of water per day you should drink.  So for instance, if you're 200 pounds, that would be 100 ounces of water per day.         Mediterranean Diet  Why follow it? Research shows. . Those who follow the Mediterranean diet have a reduced risk of heart disease  . The diet is associated with a  reduced incidence of Parkinson's and Alzheimer's diseases . People following the diet may have longer life expectancies and lower rates of chronic diseases  . The Dietary Guidelines for Americans recommends the Mediterranean diet as an eating plan to promote health and prevent disease  What Is the Mediterranean Diet?  . Healthy eating plan based on typical foods and recipes of Mediterranean-style cooking . The diet is primarily a plant based diet; these foods should make up a majority of meals   Starches - Plant based foods should make up a majority of meals - They are an important sources of vitamins, minerals, energy, antioxidants, and fiber - Choose whole grains, foods high in fiber and minimally processed items  - Typical grain sources include wheat, oats, barley, corn, brown rice, bulgar, farro, millet, polenta, couscous  - Various types of beans include chickpeas, lentils, fava beans, black beans, white beans   Fruits  Veggies - Large quantities of antioxidant rich fruits & veggies; 6 or more servings  - Vegetables can be eaten raw or lightly drizzled with oil and cooked  - Vegetables common to the traditional Mediterranean Diet include: artichokes, arugula, beets, broccoli, brussel sprouts, cabbage, carrots, celery, collard greens, cucumbers, eggplant, kale, leeks, lemons, lettuce, mushrooms, okra, onions, peas, peppers, potatoes, pumpkin, radishes, rutabaga, shallots, spinach, sweet potatoes, turnips, zucchini - Fruits common to the Mediterranean Diet include: apples, apricots, avocados, cherries, clementines, dates, figs, grapefruits, grapes, melons, nectarines, oranges, peaches, pears, pomegranates, strawberries, tangerines  Fats - Replace butter and margarine with healthy oils, such as olive oil, canola oil, and tahini  - Limit nuts to no more than a handful a day  - Nuts include walnuts, almonds, pecans, pistachios, pine nuts  - Limit or avoid candied, honey roasted or heavily salted  nuts - Olives are central to the [employer] - can be eaten whole or used in a variety of dishes   Meats Protein - Limiting red meat: no more than a few times a month - When eating red meat: choose lean cuts and keep the portion to the size of deck of cards - Eggs: approx. 0 to 4 times a week  - Fish and lean poultry: at least 2 a week  - Healthy protein sources include, chicken, [place], lean beef, lamb - Increase intake of seafood such as tuna, salmon, trout, mackerel, shrimp, scallops - Avoid or limit high fat processed meats such as sausage and bacon  Dairy - Include moderate amounts of low fat dairy products  - Focus on healthy dairy such as fat free yogurt, skim milk, low or  reduced fat cheese - Limit dairy products higher in fat such as whole or 2% milk, cheese, ice cream  Alcohol - Moderate amounts of red wine is ok  - No more than 5 oz daily for women (all ages) and men older than age 35  - No more than 10 oz of wine daily for men younger than 72  Other - Limit sweets and other desserts  - Use herbs and spices instead of salt to flavor foods  - Herbs and spices common to the traditional Mediterranean Diet include: basil, bay leaves, chives, cloves, cumin, fennel, garlic, lavender, marjoram, mint, oregano, parsley, pepper, rosemary, sage, savory, sumac, tarragon, thyme   It's not just a diet, it's a lifestyle:  . The Mediterranean diet includes lifestyle factors typical of those in the region  . Foods, drinks and meals are best eaten with others and savored . Daily physical activity is important for overall good health . This could be strenuous exercise like running and aerobics . This could also be more leisurely activities such as walking, housework, yard-work, or taking the stairs . Moderation is the key; a balanced and healthy diet accommodates most foods and drinks . Consider portion sizes and frequency of consumption of certain foods   Meal Ideas & Options:   . Breakfast:  o Whole wheat toast or whole wheat English muffins with peanut butter & hard boiled egg o Steel cut oats topped with apples & cinnamon and skim milk  o Fresh fruit: banana, strawberries, melon, berries, peaches  o Smoothies: strawberries, bananas, greek yogurt, peanut butter o Low fat greek yogurt with blueberries and granola  o Egg white omelet with spinach and mushrooms o Breakfast couscous: whole wheat couscous, apricots, skim milk, cranberries  . Sandwiches:  o Hummus and grilled vegetables (peppers, zucchini, squash) on whole wheat bread   o Grilled chicken on whole wheat pita with lettuce, tomatoes, cucumbers or tzatziki  o Tuna salad on whole wheat bread: tuna salad made with greek yogurt, olives, red peppers, capers, green onions o Garlic rosemary lamb pita: lamb sauted with garlic, rosemary, salt & pepper; add lettuce, cucumber, greek yogurt to pita - flavor with lemon juice and black pepper  . Seafood:  o Mediterranean grilled salmon, seasoned with garlic, basil, parsley, lemon juice and black pepper o Shrimp, lemon, and spinach whole-grain pasta salad made with low fat greek yogurt  o Seared scallops with lemon orzo  o Seared tuna steaks seasoned salt, pepper, coriander topped with tomato mixture of olives, tomatoes, olive oil, minced garlic, parsley, green onions and cappers  . Meats:  o Herbed greek chicken salad with kalamata olives, cucumber, feta  o Red bell peppers stuffed with spinach, bulgur, lean ground beef (or lentils) & topped with feta   o Kebabs: skewers of chicken, tomatoes, onions, zucchini, squash  o [place] burgers: made with red onions, mint, dill, lemon juice, feta cheese topped with roasted red peppers . Vegetarian o Cucumber salad: cucumbers, artichoke hearts, celery, red onion, feta cheese, tossed in olive oil & lemon juice  o Hummus and whole grain pita points with a greek salad (lettuce, tomato, feta, olives, cucumbers, red onion) o Lentil  soup with celery, carrots made with vegetable broth, garlic, salt and pepper  o Tabouli salad: parsley, bulgur, mint, scallions, cucumbers, tomato, radishes, lemon juice, olive oil, salt and pepper.     Blood Glucose Monitoring, Adult Monitoring your blood sugar (glucose) helps you manage your diabetes. It also helps you and your  health care provider determine how well your diabetes management plan is working. Blood glucose monitoring involves checking your blood glucose as often as directed, and keeping a record (log) of your results over time. Why should I monitor my blood glucose? Checking your blood glucose regularly can:  Help you understand how food, exercise, illnesses, and medicines affect your blood glucose.  Let you know what your blood glucose is at any time. You can quickly tell if you are having low blood glucose (hypoglycemia) or high blood glucose (hyperglycemia).  Help you and your health care provider adjust your medicines as needed. When should I check my blood glucose? Follow instructions from your health care provider about how often to check your blood glucose. This may depend on:  The type of diabetes you have.  How well-controlled your diabetes is.  Medicines you are taking. If you have type 1 diabetes:   Check your blood glucose at least 2 times a day.  Also check your blood glucose:  Before every insulin injection.  Before and after exercise.  Between meals.  2 hours after a meal.  Occasionally between 2:00 a.m. and 3:00 a.m., as directed.  Before potentially dangerous tasks, like driving or using heavy machinery.  At bedtime.  You may need to check your blood glucose more often, up to 6-10 times a day:  If you use an insulin pump.  If you need multiple daily injections (MDI).  If your diabetes is not well-controlled.  If you are ill.  If you have a history of severe hypoglycemia.  If you have a history of not knowing when your blood  glucose is getting low (hypoglycemia unawareness). If you have type 2 diabetes:   If you take insulin or other diabetes medicines, check your blood glucose at least 2 times a day.  If you are on intensive insulin therapy, check your blood glucose at least 4 times a day. Occasionally, you may also need to check between 2:00 a.m. and 3:00 a.m., as directed.  Also check your blood glucose:  Before and after exercise.  Before potentially dangerous tasks, like driving or using heavy machinery.  You may need to check your blood glucose more often if:  Your medicine is being adjusted.  Your diabetes is not well-controlled.  You are ill. What is a blood glucose log?  A blood glucose log is a record of your blood glucose readings. It helps you and your health care provider:  Look for patterns in your blood glucose over time.  Adjust your diabetes management plan as needed.  Every time you check your blood glucose, write down your result and notes about things that may be affecting your blood glucose, such as your diet and exercise for the day.  Most glucose meters store a record of glucose readings in the meter. Some meters allow you to download your records to a computer. How do I check my blood glucose? Follow these steps to get accurate readings of your blood glucose: Supplies needed    Blood glucose meter.  Test strips for your meter. Each meter has its own strips. You must use the strips that come with your meter.  A needle to prick your finger (lancet). Do not use lancets more than once.  A device that holds the lancet (lancing device).  A journal or log book to write down your results. Procedure   Wash your hands with soap and water.  Prick the side of your finger (not the tip) with  the lancet. Use a different finger each time.  Gently rub the finger until a small drop of blood appears.  Follow instructions that come with your meter for inserting the test strip,  applying blood to the strip, and using your blood glucose meter.  Write down your result and any notes. Alternative testing sites   Some meters allow you to use areas of your body other than your finger (alternative sites) to test your blood.  If you think you may have hypoglycemia, or if you have hypoglycemia unawareness, do not use alternative sites. Use your finger instead.  Alternative sites may not be as accurate as the fingers, because blood flow is slower in these areas. This means that the result you get may be delayed, and it may be different from the result that you would get from your finger.  The most common alternative sites are:  Forearm.  Thigh.  Palm of the hand. Additional tips   Always keep your supplies with you.  If you have questions or need help, all blood glucose meters have a 24-hour "hotline" number that you can call. You may also contact your health care provider.  After you use a few boxes of test strips, adjust (calibrate) your blood glucose meter by following instructions that came with your meter. This information is not intended to replace advice given to you by your health care provider. Make sure you discuss any questions you have with your health care provider. Document Released: 11/05/2003 Document Revised: 05/22/2016 Document Reviewed: 04/13/2016 Elsevier Interactive Patient Education  2017 [employer].

## 2017-04-08 NOTE — Assessment & Plan Note (Signed)
Lipids recently checked about a month ago.    Well controlled.LDL at goal 56.

## 2017-04-08 NOTE — Assessment & Plan Note (Signed)
On B12-- recently checked in April, labs stable.    Continue supplementation.

## 2017-04-08 NOTE — Assessment & Plan Note (Signed)
CBC stable last checked, will check 2 mo

## 2017-04-08 NOTE — Assessment & Plan Note (Signed)
Discussed to take magnesium supplements with food.  Handouts provided regarding other issues that can be causing diarrhea with his magnesium supplements.

## 2017-04-08 NOTE — Assessment & Plan Note (Signed)
Debilitating pain is making patient unable to exercise.  Patient thinks that Advil and NSAIDs were not reason for his renal dysfunction recently,   will trial mobic  Recheck CMP 2 months.

## 2017-04-08 NOTE — Assessment & Plan Note (Signed)
Reviewed with patient that serum creatinine was 1.21 back in 2011, and only 1.23 today.  This is very good.  Explained to him that recent surgery 79 months ago were likely the cause of raising his serum creatinine over 1.5.  GFR was diminished at that time and now over 60 and last blood work done approximately one month ago.  Obtain CMP with GFR in 2 months.

## 2017-05-03 ENCOUNTER — Telehealth: Payer: Self-pay | Admitting: Family Medicine

## 2017-05-03 DIAGNOSIS — M1711 Unilateral primary osteoarthritis, right knee: Secondary | ICD-10-CM

## 2017-05-03 MED ORDER — MELOXICAM 15 MG PO TABS: 15.0000 mg | ORAL_TABLET | Freq: Every day | ORAL | 1 refills | Status: DC

## 2017-05-03 NOTE — Telephone Encounter (Signed)
Meloxicam refilled.  Informed patient.

## 2017-05-03 NOTE — Telephone Encounter (Signed)
Pt called states he is unsure if  Dr. Raliegh Scarlet will do a fasting blood draw on next visit 06/03/17 & he wants to know --  pls advise. --glh

## 2017-05-03 NOTE — Telephone Encounter (Signed)
Pt called states Meloxicam gvn for only 30 dys per Rx, his Dr Ok Edwards scheduled for

## 2017-05-03 NOTE — Telephone Encounter (Signed)
Pt called states he is about to run out of Meloxicam 15 MG, Rx gvn was only for 30dys with no refills-- Pt states he has a Appt. W/ provider in July but will be out of meds by then, unsure if this was providers plan just to check him for the affect of the meds in his system--Pls advise if addt'l med will be issued. --glh

## 2017-05-05 NOTE — Telephone Encounter (Signed)
Spoke with patient regarding lab request.  Appointments scheduled.  Patient understands.

## 2017-05-07 ENCOUNTER — Other Ambulatory Visit: Payer: Self-pay | Admitting: Family Medicine

## 2017-05-07 DIAGNOSIS — H52203 Unspecified astigmatism, bilateral: Secondary | ICD-10-CM | POA: Diagnosis not present

## 2017-05-07 DIAGNOSIS — Z961 Presence of intraocular lens: Secondary | ICD-10-CM | POA: Diagnosis not present

## 2017-05-07 DIAGNOSIS — E119 Type 2 diabetes mellitus without complications: Secondary | ICD-10-CM | POA: Diagnosis not present

## 2017-05-07 DIAGNOSIS — M1711 Unilateral primary osteoarthritis, right knee: Secondary | ICD-10-CM

## 2017-05-07 LAB — HM DIABETES EYE EXAM

## 2017-05-13 ENCOUNTER — Encounter: Payer: Self-pay | Admitting: Family Medicine

## 2017-05-13 ENCOUNTER — Ambulatory Visit (INDEPENDENT_AMBULATORY_CARE_PROVIDER_SITE_OTHER): Payer: Medicare Other | Admitting: Family Medicine

## 2017-05-13 VITALS — BP 110/69 | HR 65 | Resp 18 | Ht 72.0 in | Wt 194.0 lb

## 2017-05-13 DIAGNOSIS — Z1283 Encounter for screening for malignant neoplasm of skin: Secondary | ICD-10-CM

## 2017-05-13 DIAGNOSIS — Z0000 Encounter for general adult medical examination without abnormal findings: Secondary | ICD-10-CM

## 2017-05-13 MED ORDER — ZOSTER VAC RECOMB ADJUVANTED 50 MCG/0.5ML IM SUSR: 0.5000 mL | Freq: Once | INTRAMUSCULAR | 0 refills | Status: AC

## 2017-05-13 NOTE — Patient Instructions (Addendum)
You will be due for a 5 year follow-up for colonoscopy next year in 2019.  You had a done in February.  Dr Carlean Purl- GI.     The quick and dirty--> lower triglyceride levels more through...  1) - Beware of bad fats: Cutting back on saturated fat (in red meat and full-fat dairy foods) and trans fats (in restaurant fried foods and commercially prepared baked goods) can lower triglycerides.  2) - Go for good carbs: Easily digested carbohydrates (such as white bread, white rice, cornflakes, and sugary sodas) give triglycerides a definite boost.   3) - Eating whole grains and cutting back on soda can help control triglycerides.  4) - Check your alcohol use. In some people, alcohol dramatically boosts triglycerides. The only way to know if this is true for you is to avoid alcohol for a few weeks and have your triglycerides tested again.  5) - Go fish. Omega-3 fats in salmon, tuna, sardines, and other fatty fish can lower triglycerides. Having fish twice a week is fine.  6) - Aim for a healthy weight. If you are overweight, losing just 5% to 10% of your weight can help drive down triglycerides.  7) - Get moving. Exercise lowers triglycerides and boosts heart-healthy HDL cholesterol.  8) - quit smoking if you do  --> for more information, see below; or go to  www.heart.org  and do a search for desired topics   For those diagnosed with high triglycerides, it's important to take action to lower your levels and improve your heart health.  Triglyceride is just a fancy word for fat - the fat in our bodies is stored in the form of triglycerides. Triglycerides are found in foods and manufactured in our bodies.  Normal triglyceride levels are defined as less than 150 mg/dL; 150 to 199 is considered borderline high; 200 to 499 is high; and 500 or higher is officially called very high. To me, anything over 150 is a red flag indicating my patient needs to take immediate steps to get the situation under  control.   What is the significance of high triglycerides? High triglyceride levels make blood thicker and stickier, which means that it is more likely to form clots. Studies have shown that triglyceride levels are associated with increased risks of cardiovascular disease and stroke - in both men and women - alone or in combination with other risk factors (high triglycerides combined with high LDL cholesterol can be a particularly deadly combination). For example, in one ground-breaking study, high triglycerides alone increased the risk of cardiovascular disease by 14 percent in men, and by 82 percent in women. But when the test subjects also had low HDL cholesterol (that's the good cholesterol) and other risk factors, high triglycerides increased the risk of disease by 32 percent in men and 76 percent in women.   Fortunately, triglycerides can sometimes be controlled with several diet and lifestyle changes.    What Factors Can Increase Triglycerides? As with cholesterol, eating too much of the wrong kinds of fats will raise your blood triglycerides.  Therefore, it's important to restrict the amounts of saturated fats and trans fats you allow into your diet.  Triglyceride levels can also shoot up after eating foods that are high in carbohydrates or after drinking alcohol.  That's why triglyceride blood tests require an overnight fast.  If you have elevated triglycerides, it's especially important to avoid sugary and refined carbohydrates, including sugar, honey, and other sweeteners, soda and other sugary drinks, candy,  baked goods, and anything made with white (refined or enriched) flour, including white bread, rolls, cereals, buns, pastries, regular pasta, and white rice.  You'll also want to limit dried fruit and fruit juice since they're dense in simple sugar.  All of these low-quality carbs cause a sudden rise in insulin, which may lead to a spike in triglycerides.  Triglycerides can also become  elevated as a reaction to having diabetes, hypothyroidism, or kidney disease. As with most other heart-related factors, being overweight and inactive also contribute to abnormal triglycerides. And unfortunately, some people have a genetic predisposition that causes them to manufacture way too much triglycerides on their own, no matter how carefully they eat.     How Can You Lower Your Triglyceride Levels? If you are diagnosed with high triglycerides, it's important to take action. There are several things you can do to help lower your triglyceride levels and improve your heart health:  --> Lose weight if you are overweight.  There is a clear correlation between obesity and high triglycerides - the heavier people are, the higher their triglyceride levels are likely to be. The good news is that losing weight can significantly lower triglycerides. In a large study of individuals with type 2 diabetes, those assigned to the "lifestyle intervention group" - which involved counseling, a low-calorie meal plan, and customized exercise program - lost 8.6% of their body weight and lowered their triglyceride levels by more than 16%. If you're overweight, find a weight loss plan that works for you and commit to shedding the pounds and getting healthier.  --> Reduce the amount of saturated fat and trans fat in your diet.  Start by avoiding or dramatically limiting butter, cream cheese, lard, sour cream, doughnuts, cakes, cookies, candy bars, regular ice cream, fried foods, pizza, cheese sauce, cream-based sauces and salad dressings, high-fat meats (including fatty hamburgers, bologna, pepperoni, sausage, bacon, salami, pastrami, spareribs, and hot dogs), high-fat cuts of beef and pork, and whole-milk dairy products.   Other ways to cut back: Choose lean meats only (including skinless chicken and [place], lean beef, lean pork), fish, and reduced-fat or fat-free dairy products.   Experiment with adding whole soy  foods to your diet. Although soy itself may not reduce risk of heart disease, it replaces hazardous animal fats with healthier proteins. Choose high-quality soy foods, such as tofu, tempeh, soy milk, and edamame (whole soybeans).  Always remove skin from poultry.  Prepare foods by baking, roasting, broiling, boiling, poaching, steaming, grilling, or stir-frying in vegetable oil.  Most stick margarines contain trans fats, and trans fats are also found in some packaged baked goods, potato chips, snack foods, fried foods, and fast food that use or create hydrogenated oils.    (All food labels must now list the amount of trans fats, right after the amount of saturated fats - good news for consumers. As a result, many food companies have now reformulated their products to be trans fat free.many, but not all! So it's still just as important to read labels and make sure the packaged foods you buy don't contain trans fats.)     If you use margarine, purchase soft-tub margarine spreads that contain 0 grams trans fats and don't list any partially hydrogenated oils in the ingredients list. By substituting olive oil or vegetable oil for trans fats in just 2 percent of your daily calories, you can reduce your risk of heart disease by 53 percent.   There is no safe amount of trans fats, so  try to keep them as far from your plate as possible.  -->  Avoid foods that are concentrated in sugar (even dried fruit and fruit juice). Sugary foods can elevate triglyceride levels in the blood, so keep them to a bare minimum.  --> Swap out refined carbohydrates for whole grains.  Refined carbohydrates - like white rice, regular pasta, and anything made with white or "enriched" flour (including white bread, rolls, cereals, buns, and crackers) - raise blood sugar and insulin levels more than fiber-rich whole grains. Higher insulin levels, in turn, can lead to a higher rise in triglycerides after a meal. So, make the switch to  whole wheat bread, whole grain pasta, brown or wild rice, and whole grain versions of cereals, crackers, and other bread products. However, it's important to know that individuals with high triglycerides should moderate even their intake of high-quality starches (since all starches raise blood sugar) - I recommend 1 to 2 servings per meal.  --> Cut way back on alcohol.  If you have high triglycerides, alcohol should be considered a rare treat - if you indulge at all, since even small amounts of alcohol can dramatically increase triglyceride levels.  --> Incorporate omega-3 fats.  Heart-healthy fish oils are especially rich in omega-3 fatty acids. In multiple studies over the past two decades, people who ate diets high in omega-3s had 30 to 40 percent reductions in heart disease. Although we don't yet know why fish oil works so well, there are several possibilities. Omega-3s seem to reduce inflammation, reduce high blood pressure, decrease triglycerides, raise HDL cholesterol, and make blood thinner and less sticky so it is less likely to clot. It's as close to a food prescription for heart health as it gets. If you have high triglycerides, I recommend eating at least three servings of one of the omega-3-rich fish every week (fatty fish is the most concentrated food form of omega three fats). If you cannot manage to eat that much fish, speak with your physician about taking fish oil capsules, which offer similar benefits.The best foods for omega-3 fatty acids include wild salmon (fresh, canned), herring, mackerel (not king), sardines, anchovies, rainbow trout, and Pacific oysters. Non-fish sources of omega-3 fats include omega-3-fortified eggs, ground flaxseed, chia seeds, walnuts, butternuts (white walnuts), seaweed, walnut oil, canola oil, and soybeans.  --> Quit smoking.  Smoking causes inflammation, not just in your lungs, but throughout your body. Inflammation can contribute to atherosclerosis, blood  clots, and risk of heart attack. Smoking makes all heart health indicators worse. If you have high cholesterol, high triglycerides, or high blood pressure, smoking magnifies the danger.  --> Become more physically active.  Even moderate exercise can help improve cholesterol, triglycerides, and blood pressure. Aerobic exercise seems to be able to stop the sharp rise of triglycerides after eating, perhaps because of a decrease in the amount of triglyceride released by the liver, or because active muscle clears triglycerides out of the blood stream more quickly than inactive muscle. If you haven't exercised regularly (or at all) for years, I recommend starting slowly, by walking at an easy pace for 15 minutes a day. Then, as you feel more comfortable, increase the amount. Your ultimate goal should be at least 30 minutes of moderate physical activity, at least five days a week.

## 2017-05-13 NOTE — Progress Notes (Signed)
Subjective:   Nathan Gomez is a 76 y.o. male who presents for Medicare Annual/Subsequent preventive examination.  Patient is using a stationary bike for 2 hours per week, additionally he goes to exercise class for 45 minutes 3 days per week and additionally does a tai chi class for 45 minutes once weekly.  He desires to reduce weight and lower triglycerides.  We give him a handout last office visit and he said he is implemented those changes and they are working well for him.  He declines going to a dietitian today.  He also declines starting a program such as Weight Watchers.  Health Maintenance  Topic Date Due  . Complete foot exam   01/29/2016  . Pneumonia vaccines (2 of 2 - PPSV23) 05/13/2018*  . Flu Shot  06/16/2017  . Hemoglobin A1C  08/25/2017  . Colon Cancer Screening  01/09/2018  . Eye exam for diabetics  05/07/2018  . Tetanus Vaccine  05/26/2022  *Topic was postponed. The date shown is not the original due date.     Immunization History  Administered Date(s) Administered  . Influenza Split 09/28/2011  . Influenza, High Dose Seasonal PF 07/12/2014, 07/30/2015, 08/25/2016  . Influenza, Seasonal, Injecte, Preservative Fre 07/17/2013  . Pneumococcal Conjugate-13 01/29/2015  . Td 05/26/2012  . Zoster 03/01/2009    Review of Systems: General:   Denies fever, chills, unexplained weight loss.  Optho/Auditory:   Denies visual changes, blurred vision/LOV Respiratory:   Denies SOB, DOE more than baseline levels.  Cardiovascular:   Denies chest pain, palpitations, new onset peripheral edema  Gastrointestinal:   Denies nausea, vomiting, diarrhea.  Genitourinary: Denies dysuria, freq/ urgency, flank pain or discharge from genitals.  Endocrine:     Denies hot or cold intolerance, polyuria, polydipsia. Musculoskeletal:   Denies unexplained myalgias, joint swelling, unexplained arthralgias, gait problems.  Skin:  Denies rash, suspicious lesions Neurological:     Denies  dizziness, unexplained weakness, numbness  Psychiatric/Behavioral:   Denies mood changes, suicidal or homicidal ideations, hallucinations       Objective:    Vitals: BP 110/69   Pulse 65   Resp 18   Ht 6' (1.829 m)   Wt 194 lb (88 kg)   BMI 26.31 kg/m   Body mass index is 26.31 kg/m.  Tobacco History  Smoking Status  . Former Smoker  . Packs/day: 1.00  . Years: 20.00  . Quit date: 11/16/1986  Smokeless Tobacco  . Never Used       Past Medical History:  Diagnosis Date  . Achilles tendinitis   . Complication of anesthesia    urinary retention  . 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 Diabetes mellitus, type 2 ([place])   . Dyslipidemia   . GERD (gastroesophageal reflux disease)   . Heart murmur    child  . Hypertension   . Osteoarthritis    right knee  . Personal history of colonic adenomas 01/16/2013  . Plantar fasciitis    Past Surgical History:  Procedure Laterality Date  . CATARACT EXTRACTION  08/2012 L, 09/2012 R  . COLONOSCOPY    . TONSILLECTOMY  1945  . TONSILLECTOMY    . TOTAL HIP ARTHROPLASTY  2011   Left  . 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 TOTAL KNEE ARTHROPLASTY Right 07/21/2016   Procedure: RIGHT TOTAL KNEE ARTHROPLASTY;  Surgeon:  Melrose Nakayama, MD;  Location: [facility];  Service: Orthopedics;  Laterality: Right;   Family History  Problem Relation Age of Onset  . Ovarian cancer Mother   . Hypertension Father   . Diabetes Maternal Grandfather   . Pneumonia Paternal Grandfather    History  Sexual Activity  . Sexual activity: Not on file    Outpatient Encounter Prescriptions as of 05/13/2017  Medication Sig  . amLODipine (NORVASC) 10 MG tablet Take 1 tablet (10 mg total) by mouth daily.  Marland Kitchen atorvastatin (LIPITOR) 40 MG tablet TAKE 1/2 TABLET (20 MG     TOTAL) DAILY  . blood glucose  meter kit and supplies KIT Use to test blood sugar up to twice a day. DX E11.09  . Cholecalciferol (VITAMIN D) 2000 UNITS CAPS Take 1 capsule (2,000 Units total) by mouth every morning.  . Cyanocobalamin (VITAMIN B12 PO) Take 1 tablet by mouth daily.  Marland Kitchen GLUCOSAMINE SULFATE PO Take 1 tablet by mouth daily.  Marland Kitchen glucose blood test strip Use as instructed to test blood sugar up to twice a day. DX:  E11.09  . hydrochlorothiazide (HYDRODIURIL) 25 MG tablet Take 25 mg by mouth daily.  . Inositol Niacinate (NIACIN FLUSH FREE) 500 MG CAPS Take 1 capsule by mouth every other day.   Javier Docker Oil 300 MG CAPS Take 500 mg by mouth daily.   . Lancets 16X MISC Delica Fine Lancets. Use to test blood sugar up to twice a day. DX E11.09  . lisinopril (PRINIVIL,ZESTRIL) 20 MG tablet Take 1 tablet (20 mg total) by mouth daily.  Marland Kitchen loratadine (CLARITIN) 10 MG tablet Take 1 tablet (10 mg total) by mouth daily. (Patient taking differently: Take 10 mg by mouth daily as needed for allergies. )  . magnesium oxide (MAG-OX) 400 MG tablet Take 1 tablet (400 mg total) by mouth 2 (two) times daily. (Patient taking differently: Take 400 mg by mouth as directed. Takes 29m in am and 2022min pm)  . meloxicam (MOBIC) 15 MG tablet Take 1 tablet (15 mg total) by mouth daily.  . metFORMIN (GLUCOPHAGE-XR) 500 MG 24 hr tablet TAKE 2 TABLETS (=1,000MG)  TWO TIMES A DAY  . Multiple Vitamins-Minerals (SENIOR MULTIVITAMIN PLUS) TABS Take 1 tablet by mouth daily.    . Marland Kitchenmeprazole (PRILOSEC) 20 MG capsule Take 1 capsule (20 mg total) by mouth at bedtime. (Patient taking differently: Take 20 mg by mouth every other day. )  . Probiotic Product (SOLUBLE FIBER/PROBIOTICS) CHEW Chew 2 tablets by mouth daily.  . tamsulosin (FLOMAX) 0.4 MG CAPS capsule Take 1 capsule (0.4 mg total) by mouth at bedtime. (Patient taking differently: Take 0.4-0.8 mg by mouth See admin instructions. Take 0.4 mg by mouth daily. Starting 7 days before procedure take 0.36m4my  mouth daily)  . Triamcinolone Acetonide (NASACORT AQ NA) Place 1 spray into the nose daily as needed (allergies).  . Zoster Vac Recomb Adjuvanted ([facility]owanjection Inject 0.5 mLs into the muscle once.   No facility-administered encounter medications on file as of 05/13/2017.     Activities of Daily Living In your present state of health, do you have any difficulty performing the following activities: 07/10/2016  Hearing? N  Vision? N  Difficulty concentrating or making decisions? N  Walking or climbing stairs? Y  Dressing or bathing? N  Some recent data might be hidden  Activities of Daily Living In your present state of health, do you have difficulty performing the following activities?  1- Driving - no 2-  Managing money - no 3- Feeding yourself - no 4- Getting from the bed to the chair - no 5- Climbing a flight of stairs - no 6- Preparing food and eating - no 7- Bathing or showering - no 8- Getting dressed - no 9- Getting to the toilet - no 10- Using the toilet - no 11- Moving around from place to place - no    Patient Care Team: Mellody Dance, DO as PCP - General (Family Medicine) Gatha Mayer, MD as Consulting Physician (Gastroenterology) Darleen Crocker, MD (Ophthalmology) Melrose Nakayama, MD (Orthopedic Surgery) Kathie Rhodes, MD (Urology) Katy Apo, MD as Consulting Physician (Ophthalmology)   Assessment:    Skin cancer screening - Plan: Ambulatory referral to Dermatology  Exercise Activities and Dietary recommendations    Goals    None     Fall Risk Fall Risk  05/13/2017 08/25/2016 01/29/2015 12/05/2013  Falls in the past year? No No No No   Depression Screen PHQ 2/9 Scores 08/25/2016 01/29/2015 12/05/2013 09/16/2012  PHQ - 2 Score 0 1 2 0  PHQ- 9 Score - - 4 -    Cognitive Function     6CIT Screen 05/13/2017  What Year? 0 points  What month? 0 points  What time? 0 points  Count back from 20 0 points  Months in reverse 0 points  Repeat  phrase 10 points  Total Score 10    Immunization History  Administered Date(s) Administered  . Influenza Split 09/28/2011  . Influenza, High Dose Seasonal PF 07/12/2014, 07/30/2015, 08/25/2016  . Influenza, Seasonal, Injecte, Preservative Fre 07/17/2013  . Pneumococcal Conjugate-13 01/29/2015  . Td 05/26/2012  . Zoster 03/01/2009   Screening Tests Health Maintenance  Topic Date Due  . FOOT EXAM  01/29/2016  . PNA vac Low Risk Adult (2 of 2 - PPSV23) 05/13/2018 (Originally 01/29/2016)  . INFLUENZA VACCINE  06/16/2017  . HEMOGLOBIN A1C  08/25/2017  . COLONOSCOPY  01/09/2018  . OPHTHALMOLOGY EXAM  05/07/2018  . TETANUS/TDAP  05/26/2022      Plan:   ***  I have personally reviewed and noted the following in the patient's chart:   . Medical and social history . Use of alcohol, tobacco or illicit drugs  . Current medications and supplements . Functional ability and status . Nutritional status . Physical activity . Advanced directives . List of other physicians . Hospitalizations, surgeries, and ER visits in previous 12 months . Vitals . Screenings to include cognitive, depression, and falls . Referrals and appointments  In addition, I have reviewed and discussed with patient certain preventive protocols, quality metrics, and best practice recommendations. A written personalized care plan for preventive services as well as general preventive health recommendations were provided to patient.     Mellody Dance, DO  05/13/2017

## 2017-05-14 ENCOUNTER — Other Ambulatory Visit: Payer: Self-pay

## 2017-05-14 DIAGNOSIS — M1711 Unilateral primary osteoarthritis, right knee: Secondary | ICD-10-CM

## 2017-05-14 MED ORDER — MELOXICAM 15 MG PO TABS: 15.0000 mg | ORAL_TABLET | Freq: Every day | ORAL | 1 refills | Status: DC

## 2017-06-03 ENCOUNTER — Ambulatory Visit: Payer: Medicare Other | Admitting: Family Medicine

## 2017-06-03 ENCOUNTER — Other Ambulatory Visit: Payer: Self-pay

## 2017-06-03 DIAGNOSIS — L570 Actinic keratosis: Secondary | ICD-10-CM | POA: Diagnosis not present

## 2017-06-03 DIAGNOSIS — L918 Other hypertrophic disorders of the skin: Secondary | ICD-10-CM | POA: Diagnosis not present

## 2017-06-03 DIAGNOSIS — L308 Other specified dermatitis: Secondary | ICD-10-CM | POA: Diagnosis not present

## 2017-06-03 DIAGNOSIS — D225 Melanocytic nevi of trunk: Secondary | ICD-10-CM | POA: Diagnosis not present

## 2017-06-03 DIAGNOSIS — D492 Neoplasm of unspecified behavior of bone, soft tissue, and skin: Secondary | ICD-10-CM | POA: Diagnosis not present

## 2017-06-03 DIAGNOSIS — L821 Other seborrheic keratosis: Secondary | ICD-10-CM | POA: Diagnosis not present

## 2017-06-08 ENCOUNTER — Other Ambulatory Visit: Payer: Medicare Other

## 2017-06-08 ENCOUNTER — Other Ambulatory Visit: Payer: Self-pay | Admitting: Internal Medicine

## 2017-06-10 ENCOUNTER — Encounter: Payer: Medicare Other | Admitting: Family Medicine

## 2017-06-15 ENCOUNTER — Other Ambulatory Visit (INDEPENDENT_AMBULATORY_CARE_PROVIDER_SITE_OTHER): Payer: Medicare Other

## 2017-06-15 DIAGNOSIS — M1711 Unilateral primary osteoarthritis, right knee: Secondary | ICD-10-CM | POA: Diagnosis not present

## 2017-06-15 DIAGNOSIS — E119 Type 2 diabetes mellitus without complications: Secondary | ICD-10-CM | POA: Diagnosis not present

## 2017-06-15 DIAGNOSIS — N189 Chronic kidney disease, unspecified: Secondary | ICD-10-CM | POA: Diagnosis not present

## 2017-06-15 DIAGNOSIS — E7800 Pure hypercholesterolemia, unspecified: Secondary | ICD-10-CM

## 2017-06-15 DIAGNOSIS — R5383 Other fatigue: Secondary | ICD-10-CM | POA: Diagnosis not present

## 2017-06-15 DIAGNOSIS — D649 Anemia, unspecified: Secondary | ICD-10-CM

## 2017-06-15 DIAGNOSIS — I10 Essential (primary) hypertension: Secondary | ICD-10-CM | POA: Diagnosis not present

## 2017-06-15 DIAGNOSIS — D510 Vitamin B12 deficiency anemia due to intrinsic factor deficiency: Secondary | ICD-10-CM | POA: Diagnosis not present

## 2017-06-15 DIAGNOSIS — Z87898 Personal history of other specified conditions: Secondary | ICD-10-CM | POA: Diagnosis not present

## 2017-06-16 LAB — COMPREHENSIVE METABOLIC PANEL
ALT: 9 IU/L (ref 0–44)
AST: 15 IU/L (ref 0–40)
Albumin/Globulin Ratio: 2 (ref 1.2–2.2)
Albumin: 4.3 g/dL (ref 3.5–4.8)
Alkaline Phosphatase: 74 IU/L (ref 39–117)
BUN/Creatinine Ratio: 16 (ref 10–24)
BUN: 24 mg/dL (ref 8–27)
Bilirubin Total: 0.3 mg/dL (ref 0.0–1.2)
CO2: 23 mmol/L (ref 20–29)
Calcium: 9.2 mg/dL (ref 8.6–10.2)
Chloride: 103 mmol/L (ref 96–106)
Creatinine, Ser: 1.51 mg/dL — ABNORMAL HIGH (ref 0.76–1.27)
GFR calc Af Amer: 50 mL/min/{1.73_m2} — ABNORMAL LOW (ref >59)
GFR calc non Af Amer: 44 mL/min/{1.73_m2} — ABNORMAL LOW (ref >59)
Globulin, Total: 2.1 g/dL (ref 1.5–4.5)
Glucose: 102 mg/dL — ABNORMAL HIGH (ref 65–99)
Potassium: 5 mmol/L (ref 3.5–5.2)
Sodium: 140 mmol/L (ref 134–144)
Total Protein: 6.4 g/dL (ref 6.0–8.5)

## 2017-06-16 LAB — CBC WITH DIFFERENTIAL/PLATELET
Basophils Absolute: 0 10*3/uL (ref 0.0–0.2)
Basos: 0 %
EOS (ABSOLUTE): 0.1 10*3/uL (ref 0.0–0.4)
Eos: 2 %
Hematocrit: 36.3 % — ABNORMAL LOW (ref 37.5–51.0)
Hemoglobin: 11.8 g/dL — ABNORMAL LOW (ref 13.0–17.7)
Immature Grans (Abs): 0 10*3/uL (ref 0.0–0.1)
Immature Granulocytes: 0 %
Lymphocytes Absolute: 1.6 10*3/uL (ref 0.7–3.1)
Lymphs: 27 %
MCH: 29.6 pg (ref 26.6–33.0)
MCHC: 32.5 g/dL (ref 31.5–35.7)
MCV: 91 fL (ref 79–97)
Monocytes Absolute: 0.5 10*3/uL (ref 0.1–0.9)
Monocytes: 8 %
Neutrophils Absolute: 3.7 10*3/uL (ref 1.4–7.0)
Neutrophils: 63 %
Platelets: 223 10*3/uL (ref 150–379)
RBC: 3.99 x10E6/uL — ABNORMAL LOW (ref 4.14–5.80)
RDW: 13.9 % (ref 12.3–15.4)
WBC: 5.9 10*3/uL (ref 3.4–10.8)

## 2017-06-16 LAB — VITAMIN D 25 HYDROXY (VIT D DEFICIENCY, FRACTURES): Vit D, 25-Hydroxy: 56.2 ng/mL (ref 30.0–100.0)

## 2017-06-16 LAB — HEMOGLOBIN A1C
Est. average glucose Bld gHb Est-mCnc: 131 mg/dL
Hgb A1c MFr Bld: 6.2 % — ABNORMAL HIGH (ref 4.8–5.6)

## 2017-06-16 LAB — T4, FREE: Free T4: 1.22 ng/dL (ref 0.82–1.77)

## 2017-06-16 LAB — PHOSPHORUS: Phosphorus: 3.5 mg/dL (ref 2.5–4.5)

## 2017-06-16 LAB — MAGNESIUM: Magnesium: 1.1 mg/dL — ABNORMAL LOW (ref 1.6–2.3)

## 2017-06-16 LAB — TSH: TSH: 3.33 u[IU]/mL (ref 0.450–4.500)

## 2017-06-17 ENCOUNTER — Encounter: Payer: Self-pay | Admitting: Family Medicine

## 2017-06-17 ENCOUNTER — Ambulatory Visit (INDEPENDENT_AMBULATORY_CARE_PROVIDER_SITE_OTHER): Payer: Medicare Other | Admitting: Family Medicine

## 2017-06-17 VITALS — BP 140/74 | HR 76 | Ht 72.0 in | Wt 189.6 lb

## 2017-06-17 DIAGNOSIS — Z719 Counseling, unspecified: Secondary | ICD-10-CM

## 2017-06-17 DIAGNOSIS — Z0000 Encounter for general adult medical examination without abnormal findings: Secondary | ICD-10-CM

## 2017-06-17 DIAGNOSIS — M1711 Unilateral primary osteoarthritis, right knee: Secondary | ICD-10-CM

## 2017-06-17 DIAGNOSIS — E1121 Type 2 diabetes mellitus with diabetic nephropathy: Secondary | ICD-10-CM

## 2017-06-17 DIAGNOSIS — Z1389 Encounter for screening for other disorder: Secondary | ICD-10-CM

## 2017-06-17 DIAGNOSIS — Z7984 Long term (current) use of oral hypoglycemic drugs: Secondary | ICD-10-CM

## 2017-06-17 MED ORDER — HYDROCHLOROTHIAZIDE 25 MG PO TABS: 25.0000 mg | ORAL_TABLET | Freq: Every morning | ORAL | 0 refills | Status: DC

## 2017-06-17 MED ORDER — LISINOPRIL 20 MG PO TABS: 20.0000 mg | ORAL_TABLET | Freq: Every day | ORAL | 1 refills | Status: DC

## 2017-06-17 MED ORDER — AMLODIPINE BESYLATE 10 MG PO TABS: 10.0000 mg | ORAL_TABLET | Freq: Every day | ORAL | 1 refills | Status: DC

## 2017-06-17 MED ORDER — MELOXICAM 15 MG PO TABS: 15.0000 mg | ORAL_TABLET | Freq: Every day | ORAL | 1 refills | Status: DC

## 2017-06-17 MED ORDER — METFORMIN HCL ER 500 MG PO TB24: ORAL_TABLET | ORAL | 1 refills | Status: DC

## 2017-06-17 MED ORDER — ATORVASTATIN CALCIUM 20 MG PO TABS: 20.0000 mg | ORAL_TABLET | Freq: Every day | ORAL | 1 refills | Status: DC

## 2017-06-17 NOTE — Progress Notes (Signed)
Male physical  Impression and Recommendations:    1. Encounter for wellness examination   2. Screening for multiple conditions   3. Health education/counseling   4. Controlled type 2 diabetes mellitus with diabetic nephropathy, without long-term current use of insulin (HCC)   5. Primary osteoarthritis of right knee     Orders Placed This Encounter  Procedures   HM Diabetes Foot Exam     No problem-specific Assessment & Plan notes found for this encounter.    Patient's Medications  New Prescriptions   No medications on file  Previous Medications   BLOOD GLUCOSE METER KIT AND SUPPLIES KIT    Use to test blood sugar up to twice a day. DX E11.09   CHOLECALCIFEROL (VITAMIN D) 2000 UNITS CAPS    Take 1 capsule (2,000 Units total) by mouth every morning.   CYANOCOBALAMIN (VITAMIN B12 PO)    Take 1 tablet by mouth daily.   GLUCOSAMINE SULFATE PO    Take 1 tablet by mouth daily.   GLUCOSE BLOOD TEST STRIP    Use as instructed to test blood sugar up to twice a day. DX:  E11.09   INOSITOL NIACINATE (NIACIN FLUSH FREE) 500 MG CAPS    Take 1 capsule by mouth every other day.    KRILL OIL 300 MG CAPS    Take 500 mg by mouth daily.    LANCETS 30G MISC    Delica Fine Lancets. Use to test blood sugar up to twice a day. DX E11.09   LORATADINE (CLARITIN) 10 MG TABLET    Take 1 tablet (10 mg total) by mouth daily.   MAGNESIUM OXIDE (MAG-OX) 400 MG TABLET    Take 1 tablet (400 mg total) by mouth 2 (two) times daily.   MULTIPLE VITAMINS-MINERALS (SENIOR MULTIVITAMIN PLUS) TABS    Take 1 tablet by mouth daily.     OMEPRAZOLE (PRILOSEC) 20 MG CAPSULE    Take 1 capsule (20 mg total) by mouth at bedtime.   PROBIOTIC PRODUCT (SOLUBLE FIBER/PROBIOTICS) CHEW    Chew 2 tablets by mouth daily.   TAMSULOSIN (FLOMAX) 0.4 MG CAPS CAPSULE    Take 1 capsule (0.4 mg total) by mouth at bedtime.   TRIAMCINOLONE ACETONIDE (NASACORT AQ NA)    Place 1 spray into the nose daily as needed (allergies).  Modified  Medications   Modified Medication Previous Medication   AMLODIPINE (NORVASC) 10 MG TABLET amLODipine (NORVASC) 10 MG tablet      Take 1 tablet (10 mg total) by mouth daily. -- Office visit needed for further refills    Take 1 tablet (10 mg total) by mouth daily. -- Office visit needed for further refills   ATORVASTATIN (LIPITOR) 20 MG TABLET atorvastatin (LIPITOR) 40 MG tablet      Take 1 tablet (20 mg total) by mouth at bedtime.    TAKE 1/2 TABLET (20 MG     TOTAL) DAILY   HYDROCHLOROTHIAZIDE (HYDRODIURIL) 25 MG TABLET hydrochlorothiazide (HYDRODIURIL) 25 MG tablet      Take 1 tablet (25 mg total) by mouth every morning. -- Office visit needed for further refills    Take 1 tablet (25 mg total) by mouth every morning. -- Office visit needed for further refills   LISINOPRIL (PRINIVIL,ZESTRIL) 20 MG TABLET lisinopril (PRINIVIL,ZESTRIL) 20 MG tablet      Take 1 tablet (20 mg total) by mouth daily.    Take 1 tablet (20 mg total) by mouth daily.   MELOXICAM (MOBIC) 15 MG  TABLET meloxicam (MOBIC) 15 MG tablet      Take 1 tablet (15 mg total) by mouth daily.    Take 1 tablet (15 mg total) by mouth daily.   METFORMIN (GLUCOPHAGE-XR) 500 MG 24 HR TABLET metFORMIN (GLUCOPHAGE-XR) 500 MG 24 hr tablet      TAKE 2 TABLETS (=1,000MG )  TWO TIMES A DAY    TAKE 2 TABLETS (=1,000MG )  TWO TIMES A DAY  Discontinued Medications   AMLODIPINE (NORVASC) 10 MG TABLET    Take 1 tablet (10 mg total) by mouth daily.   HYDROCHLOROTHIAZIDE (HYDRODIURIL) 25 MG TABLET    Take 25 mg by mouth daily.     Please see AVS handed out to patient at the end of our visit for further patient instructions/ counseling done pertaining to today's office visit.  1) Anticipatory Guidance: Discussed importance of wearing a seatbelt while driving, not texting while driving;   sunscreen when outside along with skin surveillance; eating a balanced and modest diet; physical activity at least 25 minutes per day or 150 min/ week moderate to  intense activity.  2) Immunizations / Screenings / Labs:   All immunizations are up-to-date per recommendations or will be updated today. Patient is due for dental and vision screens which pt will schedule independently. Will obtain CBC, CMP, HgA1c, Lipid panel, TSH and vit D when fasting, if not already done recently.   3) Weight:  BMI meaning discussed with patient.  Discussed goal of losing 5-10% of current body weight which would improve overall feelings of well being and improve objective health data. Improve nutrient density of diet through increasing intake of fruits and vegetables and decreasing saturated fats, white flour products and refined sugars.    Gross side effects, risk and benefits, and alternatives of medications discussed with patient.  Patient is aware that all medications have potential side effects and we are unable to predict every side effect or drug-drug interaction that may occur.  Expresses verbal understanding and consents to current therapy plan and treatment regimen.  Follow-up preventative CPE in 1 year. Follow-up office visit pending lab work.  F/up sooner for chronic care management and/or prn    Subjective:    CC: CPE  HPI: Nathan Gomez is a 76 y.o. male who presents to [facility] at [facility] today for a yearly health maintenance exam.     Health Maintenance Summary Reviewed and updated, unless pt declines services.  Aspirin: administering 81 mg daily Colonoscopy:     (Unnecessary secondary to < 42 or > 35 years old.) Tdap: Up to date: needs TD  Pneumovax/PPSV23:  Up to date: see Immunizations. Prevnar 13/PCV13:    To be administered at this encounter. Zostavax:    Postponed. Tobacco History Reviewed:    CT scan for screening lung CA:    Abdominal Ultrasound:     ( Unnecessary secondary to < 68 or > 69 years old) Alcohol:    No concerns, no excessive use Exercise Habits:    STD concerns:   none Drug Use:   None Birth control  method:   n/a Testicular/penile concerns:        Health Maintenance  Topic Date Due   FOOT EXAM  01/29/2016   INFLUENZA VACCINE  06/16/2017   PNA vac Low Risk Adult (2 of 2 - PPSV23) 05/13/2018 (Originally 01/29/2016)   HEMOGLOBIN A1C  12/16/2017   COLONOSCOPY  01/09/2018   OPHTHALMOLOGY EXAM  05/07/2018   TETANUS/TDAP  05/26/2022  Wt Readings from Last 3 Encounters:  06/17/17 189 lb 9.6 oz (86 kg)  05/13/17 194 lb (88 kg)  04/08/17 194 lb (88 kg)   BP Readings from Last 3 Encounters:  06/17/17 140/74  05/13/17 110/69  04/08/17 106/66   Pulse Readings from Last 3 Encounters:  06/17/17 76  05/13/17 65  04/08/17 70    Patient Active Problem List   Diagnosis Date Noted   CKD (chronic kidney disease) 02/22/2017    Priority: High   Diabetes mellitus type 2, controlled (HCC) 01/05/2011    Priority: High   Hyperlipidemia 01/05/2011    Priority: High   Essential hypertension 01/05/2011    Priority: High   Osteoarthritis 01/05/2011    Priority: Low   Ruptured, tendon, Achilles, right, sequela 04/08/2017   Elevated PSA 08/25/2016   Hypomagnesemia 04/08/2016   B12 deficiency 01/30/2016   Urinary retention 07/30/2015   Primary osteoarthritis of right knee 07/09/2015   Personal history of colonic adenomas 01/16/2013   Pernicious anemia 02/22/2012   Anemia     Past Medical History:  Diagnosis Date   Achilles tendinitis    Complication of anesthesia    urinary retention   Degenerative tear of left medial meniscus 11/2014   Diabetes mellitus, type 2 (HCC)    Dyslipidemia    GERD (gastroesophageal reflux disease)    Heart murmur    child   Hypertension    Osteoarthritis    right knee   Personal history of colonic adenomas 01/16/2013   Plantar fasciitis     Past Surgical History:  Procedure Laterality Date   CATARACT EXTRACTION  08/2012 L, 09/2012 R   COLONOSCOPY     TONSILLECTOMY  1945   TONSILLECTOMY     TOTAL HIP ARTHROPLASTY  2011   Left   TOTAL HIP  ARTHROPLASTY Right 07/09/2015   Procedure: TOTAL HIP ARTHROPLASTY ANTERIOR APPROACH;  Surgeon: Marcene Corning, MD;  Location: [facility] OR;  Service: Orthopedics;  Laterality: Right;   TOTAL KNEE ARTHROPLASTY Right 07/21/2016   Procedure: RIGHT TOTAL KNEE ARTHROPLASTY;  Surgeon: Marcene Corning, MD;  Location: [facility] OR;  Service: Orthopedics;  Laterality: Right;    Family History  Problem Relation Age of Onset   Ovarian cancer Mother    Hypertension Father    Diabetes Maternal Grandfather    Pneumonia Paternal Grandfather     History  Drug Use No  ,  History  Alcohol Use   0.6 - 1.2 oz/week   1 - 2 Glasses of wine per week  ,  History  Smoking Status   Former Smoker   Packs/day: 1.00   Years: 20.00   Quit date: 11/16/1986  Smokeless Tobacco   Never Used  ,  History  Sexual Activity   Sexual activity: Not on file    Patient's Medications  New Prescriptions   No medications on file  Previous Medications   BLOOD GLUCOSE METER KIT AND SUPPLIES KIT    Use to test blood sugar up to twice a day. DX E11.09   CHOLECALCIFEROL (VITAMIN D) 2000 UNITS CAPS    Take 1 capsule (2,000 Units total) by mouth every morning.   CYANOCOBALAMIN (VITAMIN B12 PO)    Take 1 tablet by mouth daily.   GLUCOSAMINE SULFATE PO    Take 1 tablet by mouth daily.   GLUCOSE BLOOD TEST STRIP    Use as instructed to test blood sugar up to twice a day. DX:  E11.09   INOSITOL NIACINATE (NIACIN FLUSH FREE) 500  MG CAPS    Take 1 capsule by mouth every other day.    KRILL OIL 300 MG CAPS    Take 500 mg by mouth daily.    LANCETS 30G MISC    Delica Fine Lancets. Use to test blood sugar up to twice a day. DX E11.09   LORATADINE (CLARITIN) 10 MG TABLET    Take 1 tablet (10 mg total) by mouth daily.   MAGNESIUM OXIDE (MAG-OX) 400 MG TABLET    Take 1 tablet (400 mg total) by mouth 2 (two) times daily.   MULTIPLE VITAMINS-MINERALS (SENIOR MULTIVITAMIN PLUS) TABS    Take 1 tablet by mouth daily.     OMEPRAZOLE (PRILOSEC) 20 MG  CAPSULE    Take 1 capsule (20 mg total) by mouth at bedtime.   PROBIOTIC PRODUCT (SOLUBLE FIBER/PROBIOTICS) CHEW    Chew 2 tablets by mouth daily.   TAMSULOSIN (FLOMAX) 0.4 MG CAPS CAPSULE    Take 1 capsule (0.4 mg total) by mouth at bedtime.   TRIAMCINOLONE ACETONIDE (NASACORT AQ NA)    Place 1 spray into the nose daily as needed (allergies).  Modified Medications   Modified Medication Previous Medication   AMLODIPINE (NORVASC) 10 MG TABLET amLODipine (NORVASC) 10 MG tablet      Take 1 tablet (10 mg total) by mouth daily. -- Office visit needed for further refills    Take 1 tablet (10 mg total) by mouth daily. -- Office visit needed for further refills   ATORVASTATIN (LIPITOR) 20 MG TABLET atorvastatin (LIPITOR) 40 MG tablet      Take 1 tablet (20 mg total) by mouth at bedtime.    TAKE 1/2 TABLET (20 MG     TOTAL) DAILY   HYDROCHLOROTHIAZIDE (HYDRODIURIL) 25 MG TABLET hydrochlorothiazide (HYDRODIURIL) 25 MG tablet      Take 1 tablet (25 mg total) by mouth every morning. -- Office visit needed for further refills    Take 1 tablet (25 mg total) by mouth every morning. -- Office visit needed for further refills   LISINOPRIL (PRINIVIL,ZESTRIL) 20 MG TABLET lisinopril (PRINIVIL,ZESTRIL) 20 MG tablet      Take 1 tablet (20 mg total) by mouth daily.    Take 1 tablet (20 mg total) by mouth daily.   MELOXICAM (MOBIC) 15 MG TABLET meloxicam (MOBIC) 15 MG tablet      Take 1 tablet (15 mg total) by mouth daily.    Take 1 tablet (15 mg total) by mouth daily.   METFORMIN (GLUCOPHAGE-XR) 500 MG 24 HR TABLET metFORMIN (GLUCOPHAGE-XR) 500 MG 24 hr tablet      TAKE 2 TABLETS (=1,000MG )  TWO TIMES A DAY    TAKE 2 TABLETS (=1,000MG )  TWO TIMES A DAY  Discontinued Medications   AMLODIPINE (NORVASC) 10 MG TABLET    Take 1 tablet (10 mg total) by mouth daily.   HYDROCHLOROTHIAZIDE (HYDRODIURIL) 25 MG TABLET    Take 25 mg by mouth daily.    Nsaids; Sulfa antibiotics; and Thiazide-type diuretics  Review of  Systems: General:   Denies fever, chills, unexplained weight loss.  Optho/Auditory:   Denies visual changes, blurred vision/LOV Respiratory:   Denies SOB, DOE more than baseline levels.   Cardiovascular:   Denies chest pain, palpitations, new onset peripheral edema  Gastrointestinal:   Denies nausea, vomiting, diarrhea.  Genitourinary: Denies dysuria, freq/ urgency, flank pain or discharge from genitals.  Endocrine:     Denies hot or cold intolerance, polyuria, polydipsia. Musculoskeletal:   Denies unexplained myalgias, joint  swelling, unexplained arthralgias, gait problems.  Skin:  Denies rash, suspicious lesions Neurological:     Denies dizziness, unexplained weakness, numbness  Psychiatric/Behavioral:   Denies mood changes, suicidal or homicidal ideations, hallucinations    Objective:     Blood pressure 140/74, pulse 76, height 6' (1.829 m), weight 189 lb 9.6 oz (86 kg). Body mass index is 25.71 kg/m. General Appearance:    Alert, cooperative, no distress, appears stated age  Head:    Normocephalic, without obvious abnormality, atraumatic  Eyes:    PERRL, conjunctiva/corneas clear, EOM's intact, fundi    benign, both eyes  Ears:    Normal TM's and external ear canals, both ears  Nose:   Nares normal, septum midline, mucosa normal, no drainage    or sinus tenderness  Throat:   Lips w/o lesion, mucosa moist, and tongue normal; teeth and   gums normal  Neck:   Supple, symmetrical, trachea midline, no adenopathy;    thyroid:  no enlargement/tenderness/nodules; no carotid   bruit or JVD  Back:     Symmetric, no curvature, ROM normal, no CVA tenderness  Lungs:     Clear to auscultation bilaterally, respirations unlabored, no       Wh/ R/ R  Chest Wall:    No tenderness or gross deformity; normal excursion   Heart:    Regular rate and rhythm, S1 and S2 normal, no murmur, rub   or gallop  Abdomen:     Soft, non-tender, bowel sounds active all four quadrants, NO   G/R/R, no masses, no  organomegaly  Genitalia:  Deferred- has Urologist  Rectal:    Extremities:   Extremities normal, atraumatic, no cyanosis or gross edema  Pulses:   2+ and symmetric all extremities  Skin:   Warm, dry, Skin color, texture, turgor normal, no obvious rashes or lesions  M-Sk:   Ambulates * 4 w/o difficulty, no gross deformities, tone WNL  Neurologic:   CNII-XII intact, normal strength, sensation and reflexes    Throughout Psych:  No HI/SI, judgement and insight good, Euthymic mood. Full Affect.

## 2017-06-17 NOTE — Patient Instructions (Addendum)
Double your magnesium daily if tolerated. Obtain the new statin which is 20 mg nightly and take a whole pill before bedtime. Try to push your oral intake of water daily. We can recheck your cholesterol in 6 months. Please follow up in 3 months or sooner to review his chronic medical problems or any new concerns may have.     Preventive Care for Adults, Male A healthy lifestyle and preventive care can promote health and wellness. Preventive health guidelines for men include the following key practices:  A routine yearly physical is a good way to check with your health care provider about your health and preventative screening. It is a chance to share any concerns and updates on your health and to receive a thorough exam.  Visit your dentist for a routine exam and preventative care every 6 months. Brush your teeth twice a day and floss once a day. Good oral hygiene prevents tooth decay and gum disease.  The frequency of eye exams is based on your age, health, family medical history, use of contact lenses, and other factors. Follow your health care provider's recommendations for frequency of eye exams.  Eat a healthy diet. Foods such as vegetables, fruits, whole grains, low-fat dairy products, and lean protein foods contain the nutrients you need without too many calories. Decrease your intake of foods high in solid fats, added sugars, and salt. Eat the right amount of calories for you.Get information about a proper diet from your health care provider, if necessary.  Regular physical exercise is one of the most important things you can do for your health. Most adults should get at least 150 minutes of moderate-intensity exercise (any activity that increases your heart rate and causes you to sweat) each week. In addition, most adults need muscle-strengthening exercises on 2 or more days a week.  Maintain a healthy weight. The body mass index (BMI) is a screening tool to identify possible weight  problems. It provides an estimate of body fat based on height and weight. Your health care provider can find your BMI and can help you achieve or maintain a healthy weight.For adults 20 years and older:  A BMI below 18.5 is considered underweight.  A BMI of 18.5 to 24.9 is normal.  A BMI of 25 to 29.9 is considered overweight.  A BMI of 30 and above is considered obese.  Maintain normal blood lipids and cholesterol levels by exercising and minimizing your intake of saturated fat. Eat a balanced diet with plenty of fruit and vegetables. Blood tests for lipids and cholesterol should begin at age 85 and be repeated every 5 years. If your lipid or cholesterol levels are high, you are over 50, or you are at high risk for heart disease, you may need your cholesterol levels checked more frequently.Ongoing high lipid and cholesterol levels should be treated with medicines if diet and exercise are not working.  If you smoke, find out from your health care provider how to quit. If you do not use tobacco, do not start.  Lung cancer screening is recommended for adults aged 81-80 years who are at high risk for developing lung cancer because of a history of smoking. A yearly low-dose CT scan of the lungs is recommended for people who have at least a 30-pack-year history of smoking and are a current smoker or have quit within the past 15 years. A pack year of smoking is smoking an average of 1 pack of cigarettes a day for 1 year (for  example: 1 pack a day for 30 years or 2 packs a day for 15 years). Yearly screening should continue until the smoker has stopped smoking for at least 15 years. Yearly screening should be stopped for people who develop a health problem that would prevent them from having lung cancer treatment.  If you choose to drink alcohol, do not have more than 2 drinks per day. One drink is considered to be 12 ounces (355 mL) of beer, 5 ounces (148 mL) of wine, or 1.5 ounces (44 mL) of  liquor.  Avoid use of street drugs. Do not share needles with anyone. Ask for help if you need support or instructions about stopping the use of drugs.  High blood pressure causes heart disease and increases the risk of stroke. Your blood pressure should be checked at least every 1-2 years. Ongoing high blood pressure should be treated with medicines, if weight loss and exercise are not effective.  If you are 69-30 years old, ask your health care provider if you should take aspirin to prevent heart disease.  Diabetes screening is done by taking a blood sample to check your blood glucose level after you have not eaten for a certain period of time (fasting). If you are not overweight and you do not have risk factors for diabetes, you should be screened once every 3 years starting at age 37. If you are overweight or obese and you are 47-79 years of age, you should be screened for diabetes every year as part of your cardiovascular risk assessment.  Colorectal cancer can be detected and often prevented. Most routine colorectal cancer screening begins at the age of 77 and continues through age 59. However, your health care provider may recommend screening at an earlier age if you have risk factors for colon cancer. On a yearly basis, your health care provider may provide home test kits to check for hidden blood in the stool. Use of a small camera at the end of a tube to directly examine the colon (sigmoidoscopy or colonoscopy) can detect the earliest forms of colorectal cancer. Talk to your health care provider about this at age 31, when routine screening begins. Direct exam of the colon should be repeated every 5-10 years through age 64, unless early forms of precancerous polyps or small growths are found.  People who are at an increased risk for hepatitis B should be screened for this virus. You are considered at high risk for hepatitis B if:  You were born in a country where hepatitis B occurs often. Talk  with your health care provider about which countries are considered high risk.  Your parents were born in a high-risk country and you have not received a shot to protect against hepatitis B (hepatitis B vaccine).  You have HIV or AIDS.  You use needles to inject street drugs.  You live with, or have sex with, someone who has hepatitis B.  You are a man who has sex with other men (MSM).  You get hemodialysis treatment.  You take certain medicines for conditions such as cancer, organ transplantation, and autoimmune conditions.  Hepatitis C blood testing is recommended for all people born from [birth year] through 1965 and any individual with known risks for hepatitis C.  Practice safe sex. Use condoms and avoid high-risk sexual practices to reduce the spread of sexually transmitted infections (STIs). STIs include gonorrhea, chlamydia, syphilis, trichomonas, herpes, HPV, and human immunodeficiency virus (HIV). Herpes, HIV, and HPV are viral illnesses that have  no cure. They can result in disability, cancer, and death.  If you are a man who has sex with other men, you should be screened at least once per year for:  HIV.  Urethral, rectal, and pharyngeal infection of gonorrhea, chlamydia, or both.  If you are at risk of being infected with HIV, it is recommended that you take a prescription medicine daily to prevent HIV infection. This is called preexposure prophylaxis (PrEP). You are considered at risk if:  You are a man who has sex with other men (MSM) and have other risk factors.  You are a heterosexual man, are sexually active, and are at increased risk for HIV infection.  You take drugs by injection.  You are sexually active with a partner who has HIV.  Talk with your health care provider about whether you are at high risk of being infected with HIV. If you choose to begin PrEP, you should first be tested for HIV. You should then be tested every 3 months for as long as you are taking  PrEP.  A one-time screening for abdominal aortic aneurysm (AAA) and surgical repair of large AAAs by ultrasound are recommended for men ages 54 to 67 years who are current or former smokers.  Healthy men should no longer receive prostate-specific antigen (PSA) blood tests as part of routine cancer screening. Talk with your health care provider about prostate cancer screening.  Testicular cancer screening is not recommended for adult males who have no symptoms. Screening includes self-exam, a health care provider exam, and other screening tests. Consult with your health care provider about any symptoms you have or any concerns you have about testicular cancer.  Use sunscreen. Apply sunscreen liberally and repeatedly throughout the day. You should seek shade when your shadow is shorter than you. Protect yourself by wearing long sleeves, pants, a wide-brimmed hat, and sunglasses year round, whenever you are outdoors.  Once a month, do a whole-body skin exam, using a mirror to look at the skin on your back. Tell your health care provider about new moles, moles that have irregular borders, moles that are larger than a pencil eraser, or moles that have changed in shape or color.  Stay current with required vaccines (immunizations).  Influenza vaccine. All adults should be immunized every year.  Tetanus, diphtheria, and acellular pertussis (Td, Tdap) vaccine. An adult who has not previously received Tdap or who does not know his vaccine status should receive 1 dose of Tdap. This initial dose should be followed by tetanus and diphtheria toxoids (Td) booster doses every 10 years. Adults with an unknown or incomplete history of completing a 3-dose immunization series with Td-containing vaccines should begin or complete a primary immunization series including a Tdap dose. Adults should receive a Td booster every 10 years.  Varicella vaccine. An adult without evidence of immunity to varicella should receive 2  doses or a second dose if he has previously received 1 dose.  Human papillomavirus (HPV) vaccine. Males aged 11-21 years who have not received the vaccine previously should receive the 3-dose series. Males aged 22-26 years may be immunized. Immunization is recommended through the age of 40 years for any male who has sex with males and did not get any or all doses earlier. Immunization is recommended for any person with an immunocompromised condition through the age of 79 years if he did not get any or all doses earlier. During the 3-dose series, the second dose should be obtained 4-8 weeks after the  first dose. The third dose should be obtained 24 weeks after the first dose and 16 weeks after the second dose.  Zoster vaccine. One dose is recommended for adults aged 68 years or older unless certain conditions are present.  Measles, mumps, and rubella (MMR) vaccine. Adults born before [birth year] generally are considered immune to measles and mumps. Adults born in [birth year] or later should have 1 or more doses of MMR vaccine unless there is a contraindication to the vaccine or there is laboratory evidence of immunity to each of the three diseases. A routine second dose of MMR vaccine should be obtained at least 28 days after the first dose for students attending postsecondary schools, health care workers, or international travelers. People who received inactivated measles vaccine or an unknown type of measles vaccine during 1963-1967 should receive 2 doses of MMR vaccine. People who received inactivated mumps vaccine or an unknown type of mumps vaccine before 1979 and are at high risk for mumps infection should consider immunization with 2 doses of MMR vaccine. Unvaccinated health care workers born before [birth year] who lack laboratory evidence of measles, mumps, or rubella immunity or laboratory confirmation of disease should consider measles and mumps immunization with 2 doses of MMR vaccine or rubella immunization with 1 dose  of MMR vaccine.  Pneumococcal 13-valent conjugate (PCV13) vaccine. When indicated, a person who is uncertain of his immunization history and has no record of immunization should receive the PCV13 vaccine. All adults 38 years of age and older should receive this vaccine. An adult aged 45 years or older who has certain medical conditions and has not been previously immunized should receive 1 dose of PCV13 vaccine. This PCV13 should be followed with a dose of pneumococcal polysaccharide (PPSV23) vaccine. Adults who are at high risk for pneumococcal disease should obtain the PPSV23 vaccine at least 8 weeks after the dose of PCV13 vaccine. Adults older than 78 years of age who have normal immune system function should obtain the PPSV23 vaccine dose at least 1 year after the dose of PCV13 vaccine.  Pneumococcal polysaccharide (PPSV23) vaccine. When PCV13 is also indicated, PCV13 should be obtained first. All adults aged 64 years and older should be immunized. An adult younger than age 54 years who has certain medical conditions should be immunized. Any person who resides in a nursing home or long-term care facility should be immunized. An adult smoker should be immunized. People with an immunocompromised condition and certain other conditions should receive both PCV13 and PPSV23 vaccines. People with human immunodeficiency virus (HIV) infection should be immunized as soon as possible after diagnosis. Immunization during chemotherapy or radiation therapy should be avoided. Routine use of PPSV23 vaccine is not recommended for American Indians, [place] Natives, or people younger than 65 years unless there are medical conditions that require PPSV23 vaccine. When indicated, people who have unknown immunization and have no record of immunization should receive PPSV23 vaccine. One-time revaccination 5 years after the first dose of PPSV23 is recommended for people aged 19-64 years who have chronic kidney failure, nephrotic  syndrome, asplenia, or immunocompromised conditions. People who received 1-2 doses of PPSV23 before age 25 years should receive another dose of PPSV23 vaccine at age 59 years or later if at least 5 years have passed since the previous dose. Doses of PPSV23 are not needed for people immunized with PPSV23 at or after age 57 years.  Meningococcal vaccine. Adults with asplenia or persistent complement component deficiencies should receive 2 doses of quadrivalent meningococcal conjugate (  MenACWY-D) vaccine. The doses should be obtained at least 2 months apart. Microbiologists working with certain meningococcal bacteria, military recruits, people at risk during an outbreak, and people who travel to or live in countries with a high rate of meningitis should be immunized. A first-year college student up through age 10 years who is living in a residence hall should receive a dose if he did not receive a dose on or after his 16th birthday. Adults who have certain high-risk conditions should receive one or more doses of vaccine.  Hepatitis A vaccine. Adults who wish to be protected from this disease, have chronic liver disease, work with hepatitis A-infected animals, work in hepatitis A research labs, or travel to or work in countries with a high rate of hepatitis A should be immunized. Adults who were previously unvaccinated and who anticipate close contact with an international adoptee during the first 60 days after arrival in the [place] from a country with a high rate of hepatitis A should be immunized.  Hepatitis B vaccine. Adults should be immunized if they wish to be protected from this disease, are under age 56 years and have diabetes, have chronic liver disease, have had more than one sex partner in the past 6 months, may be exposed to blood or other infectious body fluids, are household contacts or sex partners of hepatitis B positive people, are clients or workers in certain care facilities, or travel to  or work in countries with a high rate of hepatitis B.  Haemophilus influenzae type b (Hib) vaccine. A previously unvaccinated person with asplenia or sickle cell disease or having a scheduled splenectomy should receive 1 dose of Hib vaccine. Regardless of previous immunization, a recipient of a hematopoietic stem cell transplant should receive a 3-dose series 6-12 months after his successful transplant. Hib vaccine is not recommended for adults with HIV infection. Preventive Service / Frequency Ages 52 to 25  Blood pressure check.** / Every 3-5 years.  Lipid and cholesterol check.** / Every 5 years beginning at age 62.  Hepatitis C blood test.** / For any individual with known risks for hepatitis C.  Skin self-exam. / Monthly.  Influenza vaccine. / Every year.  Tetanus, diphtheria, and acellular pertussis (Tdap, Td) vaccine.** / Consult your health care provider. 1 dose of Td every 10 years.  Varicella vaccine.** / Consult your health care provider.  HPV vaccine. / 3 doses over 6 months, if 26 or younger.  Measles, mumps, rubella (MMR) vaccine.** / You need at least 1 dose of MMR if you were born in 1957 or later. You may also need a second dose.  Pneumococcal 13-valent conjugate (PCV13) vaccine.** / Consult your health care provider.  Pneumococcal polysaccharide (PPSV23) vaccine.** / 1 to 2 doses if you smoke cigarettes or if you have certain conditions.  Meningococcal vaccine.** / 1 dose if you are age 62 to 66 years and a Orthoptist living in a residence hall, or have one of several medical conditions. You may also need additional booster doses.  Hepatitis A vaccine.** / Consult your health care provider.  Hepatitis B vaccine.** / Consult your health care provider.  Haemophilus influenzae type b (Hib) vaccine.** / Consult your health care provider. Ages 72 to 69  Blood pressure check.** / Every year.  Lipid and cholesterol check.** / Every 5 years beginning  at age 40.  Lung cancer screening. / Every year if you are aged 55-80 years and have a 30-pack-year history of smoking and currently  smoke or have quit within the past 15 years. Yearly screening is stopped once you have quit smoking for at least 15 years or develop a health problem that would prevent you from having lung cancer treatment.  Fecal occult blood test (FOBT) of stool. / Every year beginning at age 34 and continuing until age 45. You may not have to do this test if you get a colonoscopy every 10 years.  Flexible sigmoidoscopy** or colonoscopy.** / Every 5 years for a flexible sigmoidoscopy or every 10 years for a colonoscopy beginning at age 31 and continuing until age 31.  Hepatitis C blood test.** / For all people born from [birth year] through 1965 and any individual with known risks for hepatitis C.  Skin self-exam. / Monthly.  Influenza vaccine. / Every year.  Tetanus, diphtheria, and acellular pertussis (Tdap/Td) vaccine.** / Consult your health care provider. 1 dose of Td every 10 years.  Varicella vaccine.** / Consult your health care provider.  Zoster vaccine.** / 1 dose for adults aged 76 years or older.  Measles, mumps, rubella (MMR) vaccine.** / You need at least 1 dose of MMR if you were born in 1957 or later. You may also need a second dose.  Pneumococcal 13-valent conjugate (PCV13) vaccine.** / Consult your health care provider.  Pneumococcal polysaccharide (PPSV23) vaccine.** / 1 to 2 doses if you smoke cigarettes or if you have certain conditions.  Meningococcal vaccine.** / Consult your health care provider.  Hepatitis A vaccine.** / Consult your health care provider.  Hepatitis B vaccine.** / Consult your health care provider.  Haemophilus influenzae type b (Hib) vaccine.** / Consult your health care provider. Ages 67 and over  Blood pressure check.** / Every year.  Lipid and cholesterol check.**/ Every 5 years beginning at age 80.  Lung cancer screening.  / Every year if you are aged 63-80 years and have a 30-pack-year history of smoking and currently smoke or have quit within the past 15 years. Yearly screening is stopped once you have quit smoking for at least 15 years or develop a health problem that would prevent you from having lung cancer treatment.  Fecal occult blood test (FOBT) of stool. / Every year beginning at age 34 and continuing until age 71. You may not have to do this test if you get a colonoscopy every 10 years.  Flexible sigmoidoscopy** or colonoscopy.** / Every 5 years for a flexible sigmoidoscopy or every 10 years for a colonoscopy beginning at age 87 and continuing until age 20.  Hepatitis C blood test.** / For all people born from [birth year] through 1967 and any individual with known risks for hepatitis C.  Abdominal aortic aneurysm (AAA) screening.** / A one-time screening for ages 46 to 66 years who are current or former smokers.  Skin self-exam. / Monthly.  Influenza vaccine. / Every year.  Tetanus, diphtheria, and acellular pertussis (Tdap/Td) vaccine.** / 1 dose of Td every 10 years.  Varicella vaccine.** / Consult your health care provider.  Zoster vaccine.** / 1 dose for adults aged 62 years or older.  Pneumococcal 13-valent conjugate (PCV13) vaccine.** / 1 dose for all adults aged 69 years and older.  Pneumococcal polysaccharide (PPSV23) vaccine.** / 1 dose for all adults aged 15 years and older.  Meningococcal vaccine.** / Consult your health care provider.  Hepatitis A vaccine.** / Consult your health care provider.  Hepatitis B vaccine.** / Consult your health care provider.  Haemophilus influenzae type b (Hib) vaccine.** / Consult your health care provider. **  Family history and personal history of risk and conditions may change your health care provider's recommendations.   This information is not intended to replace advice given to you by your health care provider. Make sure you discuss any questions you  have with your health care provider.   Document Released: 12/29/2001 Document Revised: 11/23/2014 Document Reviewed: 03/30/2011 [employer]vier Interactive Patient Education [employer].

## 2017-06-28 DIAGNOSIS — M1711 Unilateral primary osteoarthritis, right knee: Secondary | ICD-10-CM | POA: Diagnosis not present

## 2017-07-05 DIAGNOSIS — M1711 Unilateral primary osteoarthritis, right knee: Secondary | ICD-10-CM | POA: Diagnosis not present

## 2017-07-12 DIAGNOSIS — M1712 Unilateral primary osteoarthritis, left knee: Secondary | ICD-10-CM | POA: Diagnosis not present

## 2017-07-12 DIAGNOSIS — M1711 Unilateral primary osteoarthritis, right knee: Secondary | ICD-10-CM | POA: Diagnosis not present

## 2017-08-31 ENCOUNTER — Other Ambulatory Visit: Payer: Self-pay | Admitting: Internal Medicine

## 2017-09-06 ENCOUNTER — Ambulatory Visit: Payer: Medicare Other | Admitting: Family Medicine

## 2017-09-06 DIAGNOSIS — Z09 Encounter for follow-up examination after completed treatment for conditions other than malignant neoplasm: Secondary | ICD-10-CM | POA: Diagnosis not present

## 2017-09-06 DIAGNOSIS — M1711 Unilateral primary osteoarthritis, right knee: Secondary | ICD-10-CM | POA: Diagnosis not present

## 2017-09-06 DIAGNOSIS — Z96651 Presence of right artificial knee joint: Secondary | ICD-10-CM | POA: Diagnosis not present

## 2017-09-09 ENCOUNTER — Ambulatory Visit: Payer: Medicare Other | Admitting: Internal Medicine

## 2017-09-12 DIAGNOSIS — E559 Vitamin D deficiency, unspecified: Secondary | ICD-10-CM | POA: Insufficient documentation

## 2017-09-12 DIAGNOSIS — K219 Gastro-esophageal reflux disease without esophagitis: Secondary | ICD-10-CM | POA: Insufficient documentation

## 2017-09-12 NOTE — Progress Notes (Signed)
Assessment and Plan:  1. 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 Hypertension associated with diabetes (HCC)   4. Hyperlipidemia associated with type 2 diabetes mellitus (HCC)   5. Gastroesophageal reflux disease, esophagitis presence not specified   6. B12 deficiency   7. Primary osteoarthritis of right knee   8. Vitamin D deficiency   9. Flu vaccine need   10. Need for zoster vaccination     There are no diagnoses linked to this encounter.  The patient was counseled, risk factors were discussed, anticipatory guidance given.  No problem-specific Assessment & Plan notes found for this encounter.   - Hypertension:   Cont norvasc, lisinopril and HCTZ  Treatment regimen- see med orders from today for any changes   Importance of ambulatory blood pressure monitoring d/c pt- bring in log next OV  DASH diet.   Routine exercise- 150 min /wk   - Cholesterol:   Cont lipitor; Krill oil  Counseling diet and exercise.   Check cholesterol +/- CMET q 28mo    - Diabetes:     -cont metformin XR 1000 BID  Treatment regimen- see med orders from today for any changes   Counseling diet and exercise.   Check A1C q 8mo unless extremely well controlled * 2  eye & foot exams and urine microalbumin:crt ratio yearly   - Vitamin D:    monitor level and medications/ supplements as indicated.    - Weight Mgt: Explained to patient what BMI refers to, and what it means medically.   Told patient to think about it as a "medical risk stratification measurement" and how increasing BMI is associated with increasing risk/ or worsening state of various diseases such as hypertension, hyperlipidemia, diabetes, premature OA, depression etc.  American Heart Association guidelines for healthy diet, basically Mediterranean  diet, and exercise guidelines of 30 minutes 5 days per week or more discussed in detail.   -Reminded patient the need for yearly complete physical exam office visits in addition to office visits for management of the chronic diseases   Orders Placed This Encounter  Procedures   Flu vaccine HIGH DOSE PF   POCT glycosylated hemoglobin (Hb A1C)      New Prescriptions   No medications on file     Modified Medications   Modified Medication Previous Medication   AMLODIPINE (NORVASC) 10 MG TABLET amLODipine (NORVASC) 10 MG tablet      Take 1 tablet (10 mg total) by mouth daily.    Take 1 tablet (10 mg total) by mouth daily. -- Office visit needed for further refills   ATORVASTATIN (LIPITOR) 20 MG TABLET atorvastatin (LIPITOR) 20 MG tablet      Take 1 tablet (20 mg total) by mouth at bedtime.    Take 1 tablet (20 mg total) by mouth at bedtime.   CHOLECALCIFEROL (VITAMIN D) 2000 UNITS CAPS Cholecalciferol (VITAMIN D) 2000 UNITS CAPS      Take 1 capsule (2,000 Units total) by mouth every morning.    Take 1 capsule (2,000 Units total) by mouth every morning.   HYDROCHLOROTHIAZIDE (HYDRODIURIL) 25 MG TABLET hydrochlorothiazide (HYDRODIURIL) 25 MG tablet      TAKE 1 TABLET DAILY    TAKE 1 TABLET DAILY   LISINOPRIL (PRINIVIL,ZESTRIL) 20 MG TABLET lisinopril (PRINIVIL,ZESTRIL) 20 MG tablet  Take 1 tablet (20 mg total) by mouth daily.    Take 1 tablet (20 mg total) by mouth daily.   MELOXICAM (MOBIC) 15 MG TABLET meloxicam (MOBIC) 15 MG tablet      Take 1 tablet (15 mg total) by mouth daily.    Take 1 tablet (15 mg total) by mouth daily.   METFORMIN (GLUCOPHAGE-XR) 500 MG 24 HR TABLET metFORMIN (GLUCOPHAGE-XR) 500 MG 24 hr tablet      1,000 MG q 12 hrs daily    TAKE 2 TABLETS (=1,000MG )  TWO TIMES A DAY     Discontinued Medications   HYDROCHLOROTHIAZIDE (HYDRODIURIL) 25 MG TABLET    Take 1 tablet (25 mg total) by mouth every morning. -- Office visit needed for further refills   INOSITOL  NIACINATE (NIACIN FLUSH FREE) 500 MG CAPS    Take 1 capsule by mouth every other day.     -Gross side effects, risk and benefits, and alternatives of medications discussed with patient.  Patient is aware that all medications have potential side effects and we are unable to predict every side effect or drug-drug interaction that may occur.  Expresses verbal understanding and consents to current therapy plan and treatment regimen.    Follow Up:  Return for Hypertension and Diabetes follow up every 3-4 mo.  Pt was in the office today for 40+ minutes, with over 50% time spent in face to face counseling of various medical concerns and in coordination of care  Please see AVS handed out to patient at the end of our visit for further patient instructions/ counseling done pertaining to today's office visit.    Note:  This document was prepared using Dragon voice recognition software and may include unintentional dictation errors.     Subjective:  HPI: Nathan Gomez 76 y.o. male  presents for 3 month follow up for multiple medical problems.   HPI:  Nathan Gomez y.o. male presents for 3 month follow up for multiple medical problems.   1) Diabetes   Pt reports compliance with medications and/ or treatment plan Denies S-E  Home fasting glucose readings range -    120-130 FBS Denies polyuria/polydipsia. Denies hypoglycemia symptoms  Last diabetic eye exam was  Lab Results  Component Value Date   HMDIABEYEEXA No Retinopathy 05/07/2017    Foot exam-  UTD  Lab Results  Component Value Date   HGBA1C 6.6 12/14/2017   HGBA1C 6.5 09/13/2017   HGBA1C 6.2 (H) 06/15/2017    Lab Results  Component Value Date   MICROALBUR 10 04/08/2017   LDLCALC 56 02/23/2017   CREATININE 1.51 (H) 06/15/2017        2) Hyperlipidemia  Pt reports compliance with medications and/ or treatment plan Denies S-E  Patient reports poor compliance with low fat/low cholesterol diet and healthier  lifestyle  modifications.   Last lipid panel as follows:  Lab Results  Component Value Date   CHOL 115 02/23/2017   HDL 30.00 (L) 02/23/2017   LDLCALC 56 02/23/2017   LDLDIRECT 80.7 02/22/2012   TRIG 142.0 02/23/2017   CHOLHDL 4 02/23/2017         3) Hypertension  Pt reports compliance with medications and/ or treatment plan Denies S-E. Home Blood pressure range-    Low salt diet-      Exercise-     Smoking Status noted  Patient denies new onset of sx- no chest pain, dizziness, HA, DIB/ shortness of breath  or swelling. Lab Results  Component  Value Date   CREATININE 1.51 (H) 06/15/2017    Last 3 blood pressure readings in our office are as follows: BP Readings from Last 3 Encounters:  12/14/17 137/78  11/12/17 (!) 148/77  09/13/17 127/74    The CVD Risk score (D'Agostino, et al., 2008) failed to calculate for the following reasons:   CVD risk score not calculated     4) Weight:  Wt Readings from Last 3 Encounters:  12/14/17 194 lb 8 oz (88.2 kg)  11/12/17 192 lb 8 oz (87.3 kg)  09/13/17 191 lb 14.4 oz (87 kg)   BMI Readings from Last 3 Encounters:  12/14/17 26.38 kg/m  11/12/17 26.11 kg/m  09/13/17 26.03 kg/m      Patient Care Team    Relationship Specialty Notifications Start End  Thomasene Lot, DO PCP - General Family Medicine  04/08/17   Iva Boop, MD Consulting Physician Gastroenterology  09/28/11   Mia Creek, MD  Ophthalmology  09/16/12   Marcene Corning, MD  Orthopedic Surgery  12/05/13   Ihor Gully, MD  Urology  07/30/15   Antony Contras, MD Consulting Physician Ophthalmology  04/08/17   Shelly Flatten, PA-C  Dermatology  09/13/17      Review of Systems: General:   Denies fever, chills, unexplained weight loss.  Optho/Auditory:   Denies visual changes, blurred vision/LOV Respiratory:   Denies SOB, DOE more than baseline levels.   Cardiovascular:   Denies chest pain, palpitations, new onset peripheral edema   Gastrointestinal:   Denies nausea, vomiting, diarrhea.  Genitourinary: Denies dysuria, freq/ urgency, flank pain or discharge from genitals.  Endocrine:     Denies hot or cold intolerance, polyuria, polydipsia. Musculoskeletal:   Denies unexplained myalgias, joint swelling, unexplained arthralgias, gait problems.  Skin:  Denies rash, suspicious lesions Neurological:     Denies dizziness, unexplained weakness, numbness  Psychiatric/Behavioral:   Denies mood changes, suicidal or homicidal ideations, hallucinations    Objective: Physical Exam: BP 127/74   Pulse 66   Ht 6' (1.829 m)   Wt 191 lb 14.4 oz (87 kg)   BMI 26.03 kg/m  Body mass index is 26.03 kg/m. General: Well nourished, in no apparent distress. Eyes: PERRLA, EOMs, conjunctiva clr no swelling or erythema ENT/Mouth: Hearing appears normal.  Mucus Membranes Moist  Neck: Supple, no masses, no carotid bruits b/l Resp: Respiratory effort- normal, ECTA B/L w/o W/R/R  Cardio: RRR w/o MRGs. Abdomen: no gross distention. Lymphatics:  Brisk peripheral pulses, less 2 sec cap RF, no gross edema  M-sk: Full ROM, 5/5 strength, normal gait.  Skin: Warm, dry without rashes, lesions, ecchymosis.  Neuro: Alert, Oriented Psych: Normal affect, Insight and Judgment appropriate.    Current Medications:  Current Outpatient Medications on File Prior to Visit  Medication Sig Dispense Refill   blood glucose meter kit and supplies KIT Use to test blood sugar up to twice a day. DX E11.09 1 each 0   Cyanocobalamin (VITAMIN B12 PO) Take 1 tablet by mouth daily.     GLUCOSAMINE SULFATE PO Take 1 tablet by mouth daily.     glucose blood test strip Use as instructed to test blood sugar up to twice a day. DX:  E11.09 200 each 3   Krill Oil 300 MG CAPS Take 500 mg by mouth daily.      Lancets 30G MISC Delica Fine Lancets. Use to test blood sugar up to twice a day. DX E11.09 200 each 3   magnesium oxide (MAG-OX) 400 MG tablet Take  1 tablet (400 mg  total) by mouth 2 (two) times daily. (Patient taking differently: Take 400 mg by mouth as directed. Takes 200mg  in am and 200mg  in pm) 180 tablet 3   Multiple Vitamins-Minerals (SENIOR MULTIVITAMIN PLUS) TABS Take 1 tablet by mouth daily.       omeprazole (PRILOSEC) 20 MG capsule Take 1 capsule (20 mg total) by mouth at bedtime. (Patient taking differently: Take 20 mg by mouth every other day. ) 90 capsule 3   Probiotic Product (SOLUBLE FIBER/PROBIOTICS) CHEW Chew 2 tablets by mouth daily.     tamsulosin (FLOMAX) 0.4 MG CAPS capsule Take 1 capsule (0.4 mg total) by mouth at bedtime. (Patient taking differently: Take 0.4-0.8 mg by mouth See admin instructions. Take 0.4 mg by mouth daily. Starting 7 days before procedure take 0.8mg  by mouth daily) 14 capsule 0   Triamcinolone Acetonide (NASACORT AQ NA) Place 1 spray into the nose daily as needed (allergies).     loratadine (CLARITIN) 10 MG tablet Take 1 tablet (10 mg total) by mouth daily. (Patient taking differently: Take 10 mg by mouth daily as needed for allergies. ) 90 tablet 3   [DISCONTINUED] tadalafil (CIALIS) 20 MG tablet Take 0.5-1 tablets (10-20 mg total) by mouth every other day as needed for erectile dysfunction. (Patient not taking: Reported on 06/28/2015) 30 tablet 0   No current facility-administered medications on file prior to visit.     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) 02/22/2017   Elevated PSA 08/25/2016   Hypomagnesemia 04/08/2016   B12 deficiency 01/30/2016   Urinary retention 07/30/2015   Primary osteoarthritis of right knee 07/09/2015   Personal history of colonic adenomas 01/16/2013   Pernicious anemia 02/22/2012   Anemia    Diabetes mellitus type 2, controlled (HCC)  01/05/2011   Hyperlipidemia associated with type 2 diabetes mellitus (HCC) 01/05/2011   Hypertension associated with diabetes (HCC) 01/05/2011   Osteoarthritis 01/05/2011    Allergies:  Allergies  Allergen Reactions   Nsaids     Kidney Disease   Sulfa Antibiotics Other (See Comments)    As child    Not sure rxn   Thiazide-Type Diuretics Other (See Comments)    Unknown     Family history-  Reviewed; changed as appropriate  Social history-  Reviewed; changed as appropriate

## 2017-09-12 NOTE — Patient Instructions (Signed)
How to Increase Your Level of Physical Activity Getting regular physical activity is important for your overall health and well-being. Most people do not get enough exercise. There are easy ways to increase your level of physical activity, even if you have not been very active in the past or you are just starting out. Why is physical activity important? Physical activity has many short-term and long-term health benefits. Regular exercise can:  Help you lose weight or maintain a healthy weight.  Strengthen your muscles and bones.  Boost your mood and improve self-esteem.  Reduce your risk of certain long-term (chronic) diseases, like heart disease, cancer, and diabetes.  Help you stay capable of walking and moving around (mobile) as you age.  Prevent accidents, such as falls, as you age.  Increase life expectancy.  What are the benefits of being physically active on a regular basis? In addition to improving your physical health, being physically active on most days of the week can help you in ways that you may not expect. Benefits of regular physical activity may include:  Feeling good about your body.  Being able to move around more easily and for longer periods of time without getting tired (increased stamina).  Finding new sources of fun and enjoyment.  Meeting new people who share a common interest.  Being able to fight off illness better (enhanced immunity).  Being able to sleep better.  What can happen if I am not physically active on a regular basis? Not getting enough physical activity can lead to an unhealthy lifestyle and future health problems. This can increase your chances of:  Becoming overweight or obese.  Becoming sick.  Developing chronic illnesses, like heart disease or diabetes.  Having mental health problems, like depression or anxiety.  Having sleep problems.  Having trouble walking or getting yourself around (reduced mobility).  Injuring yourself  in a fall as you get older.  What steps can I take to be more physically active?  Check with your health care provider about how to get started. Ask your health care provider what activities are safe for you.  Start out slowly. Walking or doing some simple chair exercises is a good place to start, especially if you have not been active before or for a long time.  Try to find activities that you enjoy. You are more likely to commit to an exercise routine if it does not feel like a chore.  If you have bone or joint problems, choose low-impact exercises, like walking or swimming.  Include physical activity in your everyday routine.  Invite friends or family members to exercise with you. This also will help you commit to your workout plan.  Set goals that you can work toward.  Aim for at least 150 minutes of moderate-intensity exercise each week. Examples of moderate-intensity exercise include walking or riding a bike. Where to find more information:  Centers for Disease Control and Prevention: www.cdc.gov/physicalactivity/index.html  President's Council on Fitness, Sports & Nutrition www.fitness.gov/resource-center  ChooseMyPlate: www.choosemyplate.gov/physical-activity Contact a health care provider if:  You have headaches, muscle aches, or joint pain.  You feel dizzy or light-headed while exercising.  You faint.  You have chest pain while exercising. Summary  Exercise benefits your mind and body at any age, even if you are just starting out.  If you have a chronic illness or have not been active for a while, check with your health care provider before increasing your physical activity.  Choose activities that are safe and enjoyable   for you.Ask your health care provider what activities are safe for you.  Start slowly. Tell your health care provider if you have problems as you start to increase your activity level. This information is not intended to replace advice given to  you by your health care provider. Make sure you discuss any questions you have with your health care provider. Document Released: 10/22/2016 Document Revised: 10/22/2016 Document Reviewed: 10/22/2016 Elsevier Interactive Patient Education  2018 [employer]vier Inc.  

## 2017-09-13 ENCOUNTER — Encounter: Payer: Self-pay | Admitting: Family Medicine

## 2017-09-13 ENCOUNTER — Ambulatory Visit (INDEPENDENT_AMBULATORY_CARE_PROVIDER_SITE_OTHER): Payer: Medicare Other | Admitting: Family Medicine

## 2017-09-13 VITALS — BP 127/74 | HR 66 | Ht 72.0 in | Wt 191.9 lb

## 2017-09-13 DIAGNOSIS — E785 Hyperlipidemia, unspecified: Secondary | ICD-10-CM

## 2017-09-13 DIAGNOSIS — E538 Deficiency of other specified B group vitamins: Secondary | ICD-10-CM

## 2017-09-13 DIAGNOSIS — Z23 Encounter for immunization: Secondary | ICD-10-CM

## 2017-09-13 DIAGNOSIS — I10 Essential (primary) hypertension: Secondary | ICD-10-CM

## 2017-09-13 DIAGNOSIS — E559 Vitamin D deficiency, unspecified: Secondary | ICD-10-CM

## 2017-09-13 DIAGNOSIS — I152 Hypertension secondary to endocrine disorders: Secondary | ICD-10-CM

## 2017-09-13 DIAGNOSIS — E1159 Type 2 diabetes mellitus with other circulatory complications: Secondary | ICD-10-CM

## 2017-09-13 DIAGNOSIS — K219 Gastro-esophageal reflux disease without esophagitis: Secondary | ICD-10-CM

## 2017-09-13 DIAGNOSIS — E1169 Type 2 diabetes mellitus with other specified complication: Secondary | ICD-10-CM

## 2017-09-13 DIAGNOSIS — I129 Hypertensive chronic kidney disease with stage 1 through stage 4 chronic kidney disease, or unspecified chronic kidney disease: Secondary | ICD-10-CM

## 2017-09-13 DIAGNOSIS — E1122 Type 2 diabetes mellitus with diabetic chronic kidney disease: Secondary | ICD-10-CM

## 2017-09-13 DIAGNOSIS — E1129 Type 2 diabetes mellitus with other diabetic kidney complication: Secondary | ICD-10-CM

## 2017-09-13 DIAGNOSIS — N183 Chronic kidney disease, stage 3 unspecified: Secondary | ICD-10-CM

## 2017-09-13 DIAGNOSIS — M1711 Unilateral primary osteoarthritis, right knee: Secondary | ICD-10-CM

## 2017-09-13 LAB — POCT GLYCOSYLATED HEMOGLOBIN (HGB A1C): Hemoglobin A1C: 6.5

## 2017-09-13 MED ORDER — VITAMIN D 50 MCG (2000 UT) PO CAPS: 1.0000 | ORAL_CAPSULE | ORAL | 3 refills | Status: DC

## 2017-09-13 MED ORDER — AMLODIPINE BESYLATE 10 MG PO TABS: 10.0000 mg | ORAL_TABLET | Freq: Every day | ORAL | 0 refills | Status: DC

## 2017-09-13 MED ORDER — METFORMIN HCL ER 500 MG PO TB24: ORAL_TABLET | ORAL | 0 refills | Status: DC

## 2017-09-13 MED ORDER — HYDROCHLOROTHIAZIDE 25 MG PO TABS: ORAL_TABLET | ORAL | 0 refills | Status: DC

## 2017-09-13 MED ORDER — ZOSTER VAC RECOMB ADJUVANTED 50 MCG/0.5ML IM SUSR: 0.5000 mL | Freq: Once | INTRAMUSCULAR | 0 refills | Status: AC

## 2017-09-13 MED ORDER — ATORVASTATIN CALCIUM 20 MG PO TABS: 20.0000 mg | ORAL_TABLET | Freq: Every day | ORAL | 0 refills | Status: DC

## 2017-09-13 MED ORDER — LISINOPRIL 20 MG PO TABS: 20.0000 mg | ORAL_TABLET | Freq: Every day | ORAL | 0 refills | Status: DC

## 2017-09-13 MED ORDER — MELOXICAM 15 MG PO TABS: 15.0000 mg | ORAL_TABLET | Freq: Every day | ORAL | 0 refills | Status: DC

## 2017-11-12 ENCOUNTER — Encounter: Payer: Self-pay | Admitting: Family Medicine

## 2017-11-12 ENCOUNTER — Ambulatory Visit (INDEPENDENT_AMBULATORY_CARE_PROVIDER_SITE_OTHER): Payer: Medicare Other | Admitting: Family Medicine

## 2017-11-12 VITALS — BP 148/77 | HR 74 | Temp 98.3°F | Resp 16 | Ht 72.0 in | Wt 192.5 lb

## 2017-11-12 DIAGNOSIS — J069 Acute upper respiratory infection, unspecified: Secondary | ICD-10-CM | POA: Diagnosis not present

## 2017-11-12 DIAGNOSIS — J22 Unspecified acute lower respiratory infection: Secondary | ICD-10-CM | POA: Diagnosis not present

## 2017-11-12 DIAGNOSIS — R05 Cough: Secondary | ICD-10-CM | POA: Diagnosis not present

## 2017-11-12 DIAGNOSIS — R059 Cough, unspecified: Secondary | ICD-10-CM

## 2017-11-12 MED ORDER — AZITHROMYCIN 250 MG PO TABS: 250.0000 mg | ORAL_TABLET | Freq: Every day | ORAL | 0 refills | Status: DC

## 2017-11-12 MED ORDER — HYDROCOD POLST-CPM POLST ER 10-8 MG/5ML PO SUER: 5.0000 mL | Freq: Two times a day (BID) | ORAL | 0 refills | Status: DC | PRN

## 2017-11-12 NOTE — Patient Instructions (Signed)
Since symptoms more than 12 days and now worse will treat with antibiotics.  Patient desires Tussionex cough medicine and will use over-the-counter supportive medicines that were discussed as well.   - If no improvement by mid week next week he will let us know.  Explained he may need a chest x-ray if this does not improve as anticipated.

## 2017-11-12 NOTE — Progress Notes (Signed)
Acute Care Office visit  Assessment and plan:  1. Lower respiratory infection (e.g., bronchitis, pneumonia, pneumonitis, pulmonitis)   2. Cough   3. Upper respiratory tract infection, unspecified type     1. Medications prescribed as listed below. 2. OTC dayquil and nyquil cold and sinus recommended for sinus pressure and congestion. 3. Follow up as needed if symptoms do not improve. A chest Xray may be necessary if symptoms worsen. - Viral vs Bacterial causes for pt's symptoms reveiwed.    - Supportive care and various OTC medications discussed in addition to any prescribed. - Call or RTC if new symptoms, or if no improvement or worse over next several days.   - Since symptoms are present for 12 days and worsening, will treat with Abx    Gross side effects, risk and benefits, and alternatives of medications discussed with patient.  Patient is aware that all medications have potential side effects and we are unable to predict every sideeffect or drug-drug interaction that may occur.  Expresses verbal understanding and consents to current therapy plan and treatment regiment.   Education and routine counseling performed. Handouts provided.  No orders of the defined types were placed in this encounter.   Meds ordered this encounter  Medications  . azithromycin (ZITHROMAX) 250 MG tablet    Sig: Take 1 tablet (250 mg total) by mouth daily. Take first 2 tablets together, then 1 every day until finished.    Dispense:  6 tablet    Refill:  0  . chlorpheniramine-HYDROcodone (TUSSIONEX) 10-8 MG/5ML SUER    Sig: Take 5 mLs by mouth every 12 (twelve) hours as needed for cough (cough, will cause drowsiness.).    Dispense:  200 mL    Refill:  0    Anticipatory guidance and routine counseling done re: condition, txmnt options and need for follow up. All questions of patient's were answered.    Return if symptoms worsen or fail to improve.  Please see AVS handed out to patient at the  end of our visit for additional patient instructions/ counseling done pertaining to today's office visit.  Note: This document was prepared using Dragon voice recognition software and may include unintentional dictation errors.   This document serves as a record of services personally performed by Mellody Dance, DO. It was created on her behalf by Mayer Masker, a trained medical scribe. The creation of this record is based on the scribe's personal observations and the provider's statements to them.   I have reviewed the above documentation for accuracy and completeness, and I agree with the above.   Mellody Dance 11/12/17 11:23 AM   Subjective:    Chief Complaint  Patient presents with  . Cough    congestion, sinus pressure, productive     HPI:  Pt presents with Sx for 12 days. He states he was originally feeling better, but this worsened recently. He states his grandson was diagnosed with bronchitis recently and prescribed Abx.  Cough Productive cough in nature. He has associated congestion, sinus pressure, rhinorrhea, and mild wheezing at night.  Denies: SOB, chest tightness, HA, fever, sore throat, lymphadenopathy, body aches, or N/V/D, changes to appetite.    For symptoms patient has tried:  TussinDM, Nyquil  Overall getting: better at first, but worsened recently    Past medical history, Surgical history, Family history reviewed and noted below, Social history, Allergies, and Medications have been entered into the medical record, reviewed and changed as needed.   Allergies  Allergen Reactions  . Nsaids     Kidney Disease  . Sulfa Antibiotics Other (See Comments)    As child    Not sure rxn  . Thiazide-Type Diuretics Other (See Comments)    Unknown    Review of Systems: General:   No F/C, wt loss Pulm:   No DIB, pleuritic chest pain Card:  No CP, palpitations Abd:  No n/v/d or pain Ext:  No inc edema from baseline   Objective:   Blood pressure (!)  148/77, pulse 74, temperature 98.3 F (36.8 C), temperature source Oral, resp. rate 16, height 6' (1.829 m), weight 192 lb 8 oz (87.3 kg), SpO2 97 %. Body mass index is 26.11 kg/m. General: Well Developed, well nourished, appropriate for stated age.  Neuro: Alert and oriented x3, extra-ocular muscles intact, sensation grossly intact.  HEENT: Normocephalic, atraumatic, pupils equal round reactive to light, neck supple, no masses, no painful lymphadenopathy, TM's intact B/L, no acute findings. Nares- patent, clear d/c, OP- clear, mild erythema, No TTP sinuses Skin: Warm and dry, no gross rash. Cardiac: RRR, S1 S2,  no murmurs rubs or gallops.  Respiratory: ECTA B/L and A/P, Not using accessory muscles, speaking in full sentences- unlabored. Breath sounds clear. Vascular:  No gross lower ext edema, cap RF less 2 sec. Psych: No HI/SI, judgement and insight good, Euthymic mood. Full Affect.   Patient Care Team    Relationship Specialty Notifications Start End  Mellody Dance, DO PCP - General Family Medicine  04/08/17   Gatha Mayer, MD Consulting Physician Gastroenterology  09/28/11   Darleen Crocker, MD  Ophthalmology  09/16/12   Melrose Nakayama, MD  Orthopedic Surgery  12/05/13   Kathie Rhodes, MD  Urology  07/30/15   Katy Apo, MD Consulting Physician Ophthalmology  04/08/17   Arlyss Gandy, PA-C  Dermatology  09/13/17

## 2017-11-20 ENCOUNTER — Other Ambulatory Visit: Payer: Self-pay | Admitting: Family Medicine

## 2017-11-20 DIAGNOSIS — I152 Hypertension secondary to endocrine disorders: Secondary | ICD-10-CM

## 2017-11-20 DIAGNOSIS — I10 Essential (primary) hypertension: Principal | ICD-10-CM

## 2017-11-20 DIAGNOSIS — E1159 Type 2 diabetes mellitus with other circulatory complications: Secondary | ICD-10-CM

## 2017-11-22 DIAGNOSIS — L570 Actinic keratosis: Secondary | ICD-10-CM | POA: Diagnosis not present

## 2017-11-29 ENCOUNTER — Encounter: Payer: Self-pay | Admitting: Internal Medicine

## 2017-12-03 ENCOUNTER — Other Ambulatory Visit: Payer: Self-pay

## 2017-12-03 DIAGNOSIS — I10 Essential (primary) hypertension: Secondary | ICD-10-CM

## 2017-12-03 DIAGNOSIS — I152 Hypertension secondary to endocrine disorders: Secondary | ICD-10-CM

## 2017-12-03 DIAGNOSIS — M1711 Unilateral primary osteoarthritis, right knee: Secondary | ICD-10-CM

## 2017-12-03 DIAGNOSIS — E559 Vitamin D deficiency, unspecified: Secondary | ICD-10-CM

## 2017-12-03 DIAGNOSIS — E1129 Type 2 diabetes mellitus with other diabetic kidney complication: Secondary | ICD-10-CM

## 2017-12-03 DIAGNOSIS — E785 Hyperlipidemia, unspecified: Secondary | ICD-10-CM

## 2017-12-03 DIAGNOSIS — E1169 Type 2 diabetes mellitus with other specified complication: Secondary | ICD-10-CM

## 2017-12-03 DIAGNOSIS — E1159 Type 2 diabetes mellitus with other circulatory complications: Secondary | ICD-10-CM

## 2017-12-03 MED ORDER — METFORMIN HCL ER 500 MG PO TB24: ORAL_TABLET | ORAL | 1 refills | Status: DC

## 2017-12-03 MED ORDER — AMLODIPINE BESYLATE 10 MG PO TABS: 10.0000 mg | ORAL_TABLET | Freq: Every day | ORAL | 1 refills | Status: DC

## 2017-12-03 MED ORDER — HYDROCHLOROTHIAZIDE 25 MG PO TABS
ORAL_TABLET | ORAL | 1 refills | Status: DC
Start: 2017-12-03 — End: 2018-09-26

## 2017-12-03 MED ORDER — MELOXICAM 15 MG PO TABS: 15.0000 mg | ORAL_TABLET | Freq: Every day | ORAL | 1 refills | Status: DC

## 2017-12-03 MED ORDER — VITAMIN D 50 MCG (2000 UT) PO CAPS: 1.0000 | ORAL_CAPSULE | ORAL | 3 refills | Status: AC

## 2017-12-03 MED ORDER — LISINOPRIL 20 MG PO TABS: 20.0000 mg | ORAL_TABLET | Freq: Every day | ORAL | 1 refills | Status: DC

## 2017-12-03 MED ORDER — ATORVASTATIN CALCIUM 20 MG PO TABS: 20.0000 mg | ORAL_TABLET | Freq: Every day | ORAL | 1 refills | Status: DC

## 2017-12-03 NOTE — Telephone Encounter (Signed)
Refills sent to [facility] order

## 2017-12-14 ENCOUNTER — Ambulatory Visit (INDEPENDENT_AMBULATORY_CARE_PROVIDER_SITE_OTHER): Payer: Medicare Other | Admitting: Family Medicine

## 2017-12-14 ENCOUNTER — Encounter: Payer: Self-pay | Admitting: Family Medicine

## 2017-12-14 VITALS — BP 137/78 | HR 76 | Ht 72.0 in | Wt 194.5 lb

## 2017-12-14 DIAGNOSIS — E1169 Type 2 diabetes mellitus with other specified complication: Secondary | ICD-10-CM

## 2017-12-14 DIAGNOSIS — E538 Deficiency of other specified B group vitamins: Secondary | ICD-10-CM

## 2017-12-14 DIAGNOSIS — E559 Vitamin D deficiency, unspecified: Secondary | ICD-10-CM

## 2017-12-14 DIAGNOSIS — E785 Hyperlipidemia, unspecified: Secondary | ICD-10-CM | POA: Diagnosis not present

## 2017-12-14 DIAGNOSIS — E1159 Type 2 diabetes mellitus with other circulatory complications: Secondary | ICD-10-CM | POA: Diagnosis not present

## 2017-12-14 DIAGNOSIS — N183 Chronic kidney disease, stage 3 unspecified: Secondary | ICD-10-CM

## 2017-12-14 DIAGNOSIS — I152 Hypertension secondary to endocrine disorders: Secondary | ICD-10-CM

## 2017-12-14 DIAGNOSIS — I129 Hypertensive chronic kidney disease with stage 1 through stage 4 chronic kidney disease, or unspecified chronic kidney disease: Secondary | ICD-10-CM

## 2017-12-14 DIAGNOSIS — I10 Essential (primary) hypertension: Secondary | ICD-10-CM | POA: Diagnosis not present

## 2017-12-14 DIAGNOSIS — E1122 Type 2 diabetes mellitus with diabetic chronic kidney disease: Secondary | ICD-10-CM | POA: Diagnosis not present

## 2017-12-14 DIAGNOSIS — E1129 Type 2 diabetes mellitus with other diabetic kidney complication: Secondary | ICD-10-CM

## 2017-12-14 LAB — POCT GLYCOSYLATED HEMOGLOBIN (HGB A1C): Hemoglobin A1C: 6.6

## 2017-12-14 NOTE — Progress Notes (Signed)
Impression and Recommendations:    1. Controlled type 2 diabetes mellitus with other diabetic kidney complication, without long-term current use of insulin ([place])   2. Hyperlipidemia associated with type 2 diabetes mellitus ([place])   3. Hypertension associated with diabetes ([place])   4. stage 3 chronic kidney disease due to type 2 diabetes mellitus ([place])   5. Hypomagnesemia   6. B12 deficiency   7. Vitamin D deficiency     DM - Patient's diabetes continued to be well-managed on metformin as prescribed.  The patient was advised to check his blood sugar while fasting, or if he ever feels strange, dizzy, or otherwise concerned about his sugar levels.  - A1C obtained today.  Up to 6.6 from 6.5 on 09/13/2017  - The importance of a diabetic-friendly diet was reviewed with the patient today, along with the importance of increasing physical activity.  HTN - Patient continues on all medications as prescribed.  The importance of regularly monitoring blood pressure was reviewed with the patient.  He does not check his blood pressure at home.  HLD - Patient was advised that muscle aches can be caused by his Lipitor, and he should increase his water intake to help combat uncomfortable side-effects.  Adequate water intake emphasized in general, with the patient encourage to drink at least half of his body weight in ounces of water.  Sinusitis - For ongoing nasal drainage, recommended flushing with Neti-Pot to remove allergens from the nasal cavity.  Patient encouraged to use the humidifier he just purchased to increase the humidity in his house.  - Patient encouraged to use flonase or similar, but discouraged to use afrin or similar.  General Health Maintenance  - Recommended that the patient start off exercising slow, first for 10 minutes at a time, then coming home to stretch out his legs and muscles.  After that he can up his activity level to 15 minutes, come home and stretch.  The patient  should gradually build up his activity levels to 30 minutes or more per day, aiming for the 150 minutes of cardiovascular activity encouraged by guidelines established by the AHA.  - Patient will continue to update [place] on his other doctor's visits and procedures as he obtains them.  Pt was in the office today for 40+ minutes, with over 50% time spent in face to face counseling of patients various medical conditions, treatment plans of those medical conditions including medicine management and lifestyle modification, strategies to improve health and well being; and in coordination of care. SEE ABOVE FOR DETAILS  Follow-up - No refills needed today.  - In 3 months, end of April, we will check his Magnesium and other fasting lab work. 1 week later, OV for chronic follow-up of his medical problems.   Education and routine counseling performed. Handouts provided.  Pt was in the office today for 40+ minutes, with over 50% time spent in face to face counseling of patients various medical conditions, treatment plans of those medical conditions including medicine management and lifestyle modification, strategies to improve health and well being; and in coordination of care. SEE ABOVE FOR DETAILS   Orders Placed This Encounter  Procedures  . Vitamin B12  . Hemoglobin A1c  . Comprehensive metabolic panel  . CBC with Differential/Platelet  . Lipid panel  . T4, free  . TSH  . Magnesium  . Phosphorus  . VITAMIN D 25 Hydroxy (Vit-D Deficiency, Fractures)  . Microalbumin / creatinine urine ratio  . POCT  glycosylated hemoglobin (Hb A1C)    Return for 3-4 mo-come in for fasting blood work then [place] with me 1 week later..   The patient was counseled, risk factors were discussed, anticipatory guidance given.  Gross side effects, risk and benefits, and alternatives of medications discussed with patient.  Patient is aware that all medications have potential side effects and we are unable to predict every  side effect or drug-drug interaction that may occur.  Expresses verbal understanding and consents to current therapy plan and treatment regimen.  Please see AVS handed out to patient at the end of our visit for further patient instructions/ counseling done pertaining to today's office visit.    Note: This document was prepared using Dragon voice recognition software and may include unintentional dictation errors.   This document serves as a record of services personally performed by Mellody Dance, DO. It was created on her behalf by Toni Amend, a trained medical scribe. The creation of this record is based on the scribe's personal observations and the provider's statements to them.   I have reviewed the above medical documentation for accuracy and completeness and I concur.  Mellody Dance 12/27/17 6:33 PM     Subjective:    Chief Complaint  Patient presents with  . Follow-up    KYUSS HALE is a 77 y.o. male who presents to [place] at [facility] today for Diabetes Management.     DM HPI:  -  He has not been working on diet and exercise for diabetes.    Pt is currently maintained on the following medications for diabetes:   see med list today Takes 1000 mg of metformin every twelve hours.  Medication compliance - continues to take his metformin regularly.  Has controlled his diabetes well for the past 13 years.  Home glucose readings range are as follows: Doesn't check his fasting blood sugar every day.  Checks it a couple of times a week usually. He has had his fasting blood sugar down to 120.  He cannot remember a lower number than that over the years. After his surgeries, he notes that his sugar usually shoots up to 200+ and takes 6 weeks or so to come back down, but it always comes back down. Averages daily at low 120 with a high of about 180.   Denies polyuria/polydipsia. Denies hypo/ hyperglycemia symptoms - He denies new onset of:  chest pain, exercise intolerance, shortness of breath, dizziness, visual changes, headache, lower extremity swelling or claudication.   Last diabetic eye exam was  Lab Results  Component Value Date   HMDIABEYEEXA No Retinopathy 05/07/2017    Foot exam- performed in office today  Last A1C in the office was: 6.6 today.   Lab Results  Component Value Date   HGBA1C 6.6 12/14/2017   HGBA1C 6.5 09/13/2017   HGBA1C 6.2 (H) 06/15/2017    Lab Results  Component Value Date   MICROALBUR 10 04/08/2017   LDLCALC 56 02/23/2017   CREATININE 1.51 (H) 06/15/2017    HTN HPI:   -  His blood pressure has been controlled at home.   - Patient reports good compliance with blood pressure medications. Still taking amlodipine 46m, full tablet in the morning. Still takes Lisinopril in the morning. Still taking HCTZ.  He does not check his blood pressure at home. Has it checked here, at the dentist, and other doctors.   Usually it's around 120/70-something.  - Denies medication S-E   - Smoking  Status noted: Former smoker, quit 11/16/1986, 1ppd, 20 pack-years.  - He denies new onset of: chest pain, exercise intolerance, shortness of breath, dizziness, visual changes, headache, lower extremity swelling or claudication.   Today his BP is 137/78.  Last 3 blood pressure readings in our office are as follows: BP Readings from Last 3 Encounters:  12/14/17 137/78  11/12/17 (!) 148/77  09/13/17 127/74    HLD Takes his cholesterol medicine every night before bed.  Denies any muscle aches or anything new.  Orthopedic/Knees Believes he will resume getting shots in his knees in March, as they still work well.  He continues to follow up at [facility].   General Health Maintenance Exercising less due to the holidays and weather.  Admits that he needs to go back to doing more formal exercises.  He did get out and walk the other day for the first time in a long time, for about a mile and a  half.  His knees and achilles tendon have been his main issue with physical activity.  Is now taking 400 mg Magnesium twice daily.  Has been taking it regularly for 4 months.  Taking Prilosec every third day, and zantac in-between.  Patient continues with his health maintenance. He continues following up with dermatology. He has made his appointment for his colonoscopy in March. First of April is his annual follow up with urology.  Has an eye exam scheduled in June.   Filed Weights   12/14/17 1354  Weight: 194 lb 8 oz (88.2 kg)     Last 3 blood pressure readings in our office are as follows: BP Readings from Last 3 Encounters:  12/14/17 137/78  11/12/17 (!) 148/77  09/13/17 127/74    BMI Readings from Last 3 Encounters:  12/14/17 26.38 kg/m  11/12/17 26.11 kg/m  09/13/17 26.03 kg/m     No problems updated.    Patient Care Team    Relationship Specialty Notifications Start End  Mellody Dance, DO PCP - General Family Medicine  04/08/17   Gatha Mayer, MD Consulting Physician Gastroenterology  09/28/11   Darleen Crocker, MD  Ophthalmology  09/16/12   Melrose Nakayama, MD  Orthopedic Surgery  12/05/13   Kathie Rhodes, MD  Urology  07/30/15   Katy Apo, MD Consulting Physician Ophthalmology  04/08/17   Arlyss Gandy, PA-C  Dermatology  09/13/17      Patient Active Problem List   Diagnosis Date Noted  . stage 3 chronic kidney disease due to type 2 diabetes mellitus ([place]) 02/22/2017    Priority: High  . Diabetes mellitus type 2, controlled ([place]) 01/05/2011    Priority: High  . Hyperlipidemia associated with type 2 diabetes mellitus ([place]) 01/05/2011    Priority: High  . Hypertension associated with diabetes ([place]) 01/05/2011    Priority: High  . GERD (gastroesophageal reflux disease) 09/12/2017    Priority: Medium  . Urinary retention 07/30/2015    Priority: Medium  . Personal history of colonic adenomas 01/16/2013    Priority: Medium  .  Hypomagnesemia 04/08/2016    Priority: Low  . B12 deficiency 01/30/2016    Priority: Low  . Pernicious anemia 02/22/2012    Priority: Low  . Anemia     Priority: Low  . Osteoarthritis 01/05/2011    Priority: Low  . Vitamin D deficiency 09/12/2017  . Ruptured, tendon, Achilles, right, sequela 04/08/2017  . Elevated PSA 08/25/2016  . Primary osteoarthritis of right knee 07/09/2015     Past Medical History:  Diagnosis Date  . Achilles tendinitis   . Complication of anesthesia    urinary retention  . 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 Diabetes mellitus, type 2 ([place])   . Dyslipidemia   . GERD (gastroesophageal reflux disease)   . Heart murmur    child  . Hypertension   . Osteoarthritis    right knee  . Personal history of colonic adenomas 01/16/2013  . Plantar fasciitis      Past Surgical History:  Procedure Laterality Date  . CATARACT EXTRACTION  08/2012 L, 09/2012 R  . COLONOSCOPY    . TONSILLECTOMY  1945  . TONSILLECTOMY    . TOTAL HIP ARTHROPLASTY  2011   Left  . 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 TOTAL KNEE ARTHROPLASTY Right 07/21/2016   Procedure: RIGHT TOTAL KNEE ARTHROPLASTY;  Surgeon: Melrose Nakayama, MD;  Location: [facility];  Service: Orthopedics;  Laterality: Right;     Family History  Problem Relation Age of [place]  . Ovarian cancer Mother   . Hypertension Father   . Diabetes Maternal Grandfather   . Pneumonia Paternal Grandfather      Social History   Substance and Sexual Activity  Drug Use No  ,  Social History   Substance and Sexual Activity  Alcohol Use Yes  . Alcohol/week: 0.6 - 1.2 oz  . Types: 1 - 2 Glasses of wine per week  ,  Social History   Tobacco Use  Smoking Status Former Smoker  . Packs/day: 1.00  . Years: 20.00  . Pack years: 20.00  . Last attempt to quit:  11/16/1986  . Years since quitting: 31.1  Smokeless Tobacco Never Used  ,    Current Outpatient Medications on File Prior to Visit  Medication Sig Dispense Refill  . amLODipine (NORVASC) 10 MG tablet Take 1 tablet (10 mg total) by mouth daily. 90 tablet 1  . atorvastatin (LIPITOR) 20 MG tablet Take 1 tablet (20 mg total) by mouth at bedtime. 90 tablet 1  . blood glucose meter kit and supplies KIT Use to test blood sugar up to twice a day. DX E11.09 1 each 0  . Cholecalciferol (VITAMIN D) 2000 units CAPS Take 1 capsule (2,000 Units total) by mouth every morning. 90 capsule 3  . Cyanocobalamin (VITAMIN B12 PO) Take 1 tablet by mouth daily.    Marland Kitchen GLUCOSAMINE SULFATE PO Take 1 tablet by mouth daily.    Mar[place] Kitchen glucose blood test strip Use as instructed to test blood sugar up to twice a day. DX:  E11.09 200 each 3  . hydrochlorothiazide (HYDRODIURIL) 25 MG tablet TAKE 1 TABLET DAILY 90 tablet 1  . Krill Oil 300 MG CAPS Take 500 mg by mouth daily.     . Lancets 62V MISC Delica Fine Lancets. Use to test blood sugar up to twice a day. DX E11.09 200 each 3  . lisinopril (PRINIVIL,ZESTRIL) 20 MG tablet Take 1 tablet (20 mg total) by mouth daily. 90 tablet 1  . magnesium oxide (MAG-OX) 400 MG tablet Take 1 tablet (400 mg total) by mouth 2 (two) times daily. (Patient taking differently: Take 400 mg by mouth as directed. Takes 275m in am and 2065min pm) 180 tablet 3  . meloxicam (MOBIC) 15 MG tablet Take 1 tablet (15 mg total) by mouth daily. 90 tablet 1  . metFORMIN (  GLUCOPHAGE-XR) 500 MG 24 hr tablet 1,000 MG q 12 hrs daily 360 tablet 1  . Multiple Vitamins-Minerals (SENIOR MULTIVITAMIN PLUS) TABS Take 1 tablet by mouth daily.      Marland Kitchen omeprazole (PRILOSEC) 20 MG capsule Take 1 capsule (20 mg total) by mouth at bedtime. (Patient taking differently: Take 20 mg by mouth every other day. ) 90 capsule 3  . Probiotic Product (SOLUBLE FIBER/PROBIOTICS) CHEW Chew 2 tablets by mouth daily.    . ranitidine (ZANTAC) 150  MG tablet Take 150 mg by mouth 2 (two) times daily.    . tamsulosin (FLOMAX) 0.4 MG CAPS capsule Take 1 capsule (0.4 mg total) by mouth at bedtime. (Patient taking differently: Take 0.4-0.8 mg by mouth See admin instructions. Take 0.4 mg by mouth daily. Starting 7 days before procedure take 0.37m by mouth daily) 14 capsule 0  . Triamcinolone Acetonide (NASACORT AQ NA) Place 1 spray into the nose daily as needed (allergies).    . loratadine (CLARITIN) 10 MG tablet Take 1 tablet (10 mg total) by mouth daily. (Patient taking differently: Take 10 mg by mouth daily as needed for allergies. ) 90 tablet 3  . [DISCONTINUED] tadalafil (CIALIS) 20 MG tablet Take 0.5-1 tablets (10-20 mg total) by mouth every other day as needed for erectile dysfunction. (Patient not taking: Reported on 06/28/2015) 30 tablet 0   No current facility-administered medications on file prior to visit.      Allergies  Allergen Reactions  . Nsaids     Kidney Disease  . Sulfa Antibiotics Other (See Comments)    As child    Not sure rxn  . Thiazide-Type Diuretics Other (See Comments)    Unknown     Review of Systems:   General:  Denies fever, chills Optho/Auditory:   Denies visual changes, blurred vision Respiratory:   Denies SOB, cough, wheeze, DIB  Cardiovascular:   Denies chest pain, palpitations, painful respirations Gastrointestinal:   Denies nausea, vomiting, diarrhea.  Endocrine:     Denies new hot or cold intolerance Musculoskeletal:  Denies joint swelling, gait issues, or new unexplained myalgias/ arthralgias Skin:  Denies rash, suspicious lesions  Neurological:    Denies dizziness, unexplained weakness, numbness  Psychiatric/Behavioral:   Denies mood changes    Objective:     Blood pressure 137/78, pulse 76, height 6' (1.829 m), weight 194 lb 8 oz (88.2 kg), SpO2 97 %.  Body mass index is 26.38 kg/m.  General: Well Developed, well nourished, and in no acute distress.  HEENT: Normocephalic, atraumatic,  pupils equal round reactive to light, neck supple, No carotid bruits, no JVD Skin: Warm and dry, cap RF less 2 sec Cardiac: Regular rate and rhythm, S1, S2 WNL's, no murmurs rubs or gallops Respiratory: ECTA B/L, Not using accessory muscles, speaking in full sentences. NeuroM-Sk: Ambulates w/o assistance, moves ext * 4 w/o difficulty, sensation grossly intact.  Ext: 1+ pitting edema on bilateral LE, slightly worse on the right than left. Psych: No HI/SI, judgement and insight good, Euthymic mood. Full Affect.

## 2017-12-14 NOTE — Patient Instructions (Addendum)
In 3 months patient will come in and get a full set of fasting blood work prior to [place] then 1 week later have office visit with me for chronic follow-up of his medical problems.  Melissa please please do diabetic foot exam on patient prior to leaving the office today.  Please document in the chart.     Diabetes Mellitus and Standards of Medical Care  Managing diabetes (diabetes mellitus) can be complicated. Your diabetes treatment may be managed by a team of health care providers, including:  A diet and nutrition specialist (registered dietitian).  A nurse.  A certified diabetes educator (CDE).  A diabetes specialist (endocrinologist).  An eye doctor.  A primary care provider.  A dentist.  Your health care providers follow a schedule in order to help you get the best quality of care. The following schedule is a general guideline for your diabetes management plan. Your health care providers may also give you more specific instructions.  HbA1c (hemoglobin A1c) test This test provides information about blood sugar (glucose) control over the previous 2-3 months. It is used to check whether your diabetes management plan needs to be adjusted.  If you are meeting your treatment goals, this test is done at least 2 times a year.  If you are not meeting treatment goals or if your treatment goals have changed, this test is done 4 times a year.  Blood pressure test  This test is done at every routine medical visit. For most people, the goal is less than 130/80. Ask your health care provider what your goal blood pressure should be.  Dental and eye exams  Visit your dentist two times a year.  If you have type 1 diabetes, get an eye exam 3-5 years after you are diagnosed, and then once a year after your first exam. ? If you were diagnosed with type 1 diabetes as a child, get an eye exam when you are age 20 or older and have had diabetes for 3-5 years. After the first exam, you should get  an eye exam once a year.  If you have type 2 diabetes, have an eye exam as soon as you are diagnosed, and then once a year after your first exam.  Foot care exam  Visual foot exams are done at every routine medical visit. The exams check for cuts, bruises, redness, blisters, sores, or other problems with the feet.  A complete foot exam is done by your health care provider once a year. This exam includes an inspection of the structure and skin of your feet, and a check of the pulses and sensation in your feet. ? Type 1 diabetes: Get your first exam 3-5 years after diagnosis. ? Type 2 diabetes: Get your first exam as soon as you are diagnosed.  Check your feet every day for cuts, bruises, redness, blisters, or sores. If you have any of these or other problems that are not healing, contact your health care provider.  Kidney function test (urine microalbumin)  This test is done once a year. ? Type 1 diabetes: Get your first test 5 years after diagnosis. ? Type 2 diabetes: Get your first test as soon as you are diagnosed._  If you have chronic kidney disease (CKD), get a serum creatinine and estimated glomerular filtration rate (eGFR) test once a year.  Lipid profile (cholesterol, HDL, LDL, triglycerides)  This test should be done when you are diagnosed with diabetes, and every 5 years after the first test. If  you are on medicines to lower your cholesterol, you may need to get this test done every year. ? The goal for LDL is less than 100 mg/dL (5.5 mmol/L). If you are at high risk, the goal is less than 70 mg/dL (3.9 mmol/L). ? The goal for HDL is 40 mg/dL (2.2 mmol/L) for men and 50 mg/dL(2.8 mmol/L) for women. An HDL cholesterol of 60 mg/dL (3.3 mmol/L) or higher gives some protection against heart disease. ? The goal for triglycerides is less than 150 mg/dL (8.3 mmol/L).  Immunizations  The yearly flu (influenza) vaccine is recommended for everyone 6 months or older who has  diabetes.  The pneumonia (pneumococcal) vaccine is recommended for everyone 2 years or older who has diabetes. If you are 82 or older, you may get the pneumonia vaccine as a series of two separate shots.  The hepatitis B vaccine is recommended for adults shortly after they have been diagnosed with diabetes.  The Tdap (tetanus, diphtheria, and pertussis) vaccine should be given: ? According to normal childhood vaccination schedules, for children. ? Every 10 years, for adults who have diabetes.  The shingles vaccine is recommended for people who have had chicken pox and are 50 years or older.  Mental and emotional health  Screening for symptoms of eating disorders, anxiety, and depression is recommended at the time of diagnosis and afterward as needed. If your screening shows that you have symptoms (you have a positive screening result), you may need further evaluation and be referred to a mental health care provider.  Diabetes self-management education  Education about how to manage your diabetes is recommended at diagnosis and ongoing as needed.  Treatment plan  Your treatment plan will be reviewed at every medical visit.  Summary  Managing diabetes (diabetes mellitus) can be complicated. Your diabetes treatment may be managed by a team of health care providers.  Your health care providers follow a schedule in order to help you get the best quality of care.  Standards of care including having regular physical exams, blood tests, blood pressure monitoring, immunizations, screening tests, and education about how to manage your diabetes.  Your health care providers may also give you more specific instructions based on your individual health.      Type 2 Diabetes Mellitus, Self Care, Adult Caring for yourself after you have been diagnosed with type 2 diabetes (type 2 diabetes mellitus) means keeping your blood sugar (glucose) under control with a balance  of:  Nutrition.  Exercise.  Lifestyle changes.  Medicines or insulin, if necessary.  Support from your team of health care providers and others.  The following information explains what you need to know to manage your diabetes at home. What do I need to do to manage my blood glucose?  Check your blood glucose every day, as often as told by your health care provider.  Contact your health care provider if your blood glucose is above your target for 2 tests in a row.  Have your A1c (hemoglobin A1c) level checked at least two times a year, or as often as told by your health care provider. Your health care provider will set individualized treatment goals for you. Generally, the goal of treatment is to maintain the following blood glucose levels:  Before meals (preprandial): 80-130 mg/dL (4.4-7.2 mmol/L).  After meals (postprandial): below 180 mg/dL (10 mmol/L).  A1c level: less than 7%.  What do I need to know about hyperglycemia and hypoglycemia? What is hyperglycemia? Hyperglycemia, also called high  blood glucose, occurs when blood glucose is too high.Make sure you know the early signs of hyperglycemia, such as:  Increased thirst.  Hunger.  Feeling very tired.  Needing to urinate more often than usual.  Blurry vision.  What is hypoglycemia? Hypoglycemia, also called low blood glucose, occurswith a blood glucose level at or below 70 mg/dL (3.9 mmol/L). The risk for hypoglycemia increases during or after exercise, during sleep, during illness, and when skipping meals or not eating for a long time (fasting). It is important to know the symptoms of hypoglycemia and treat it right away. Always have a 15-gram rapid-acting carbohydrate snack with you to treat low blood glucose. Family members and close friends should also know the symptoms and should understand how to treat hypoglycemia, in case you are not able to treat yourself. What are the symptoms of  hypoglycemia? Hypoglycemia symptoms can include:  Hunger.  Anxiety.  Sweating and feeling clammy.  Confusion.  Dizziness or feeling light-headed.  Sleepiness.  Nausea.  Increased heart rate.  Headache.  Blurry vision.  Seizure.  Nightmares.  Tingling or numbness around the mouth, lips, or tongue.  A change in speech.  Decreased ability to concentrate.  A change in coordination.  Restless sleep.  Tremors or shakes.  Fainting.  Irritability.  How do I treat hypoglycemia?  If you are alert and able to swallow safely, follow the 15:15 rule:  Take 15 grams of a rapid-acting carbohydrate. Rapid-acting options include: ? 1 tube of glucose gel. ? 3 glucose pills. ? 6-8 pieces of hard candy. ? 4 oz (120 mL) of fruit juice. ? 4 oz (120 mL) of regular (not diet) soda.  Check your blood glucose 15 minutes after you take the carbohydrate.  If the repeat blood glucose level is still at or below 70 mg/dL (3.9 mmol/L), take 15 grams of a carbohydrate again.  If your blood glucose level does not increase above 70 mg/dL (3.9 mmol/L) after 3 tries, seek emergency medical care.  After your blood glucose level returns to normal, eat a meal or a snack within 1 hour.  How do I treat severe hypoglycemia? Severe hypoglycemia is when your blood glucose level is at or below 54 mg/dL (3 mmol/L). Severe hypoglycemia is an emergency. Do not wait to see if the symptoms will go away. Get medical help right away. Call your local emergency services (911 in the [place] Do not drive yourself to the hospital. If you have severe hypoglycemia and you cannot eat or drink, you may need an injection of glucagon. A family member or close friend should learn how to check your blood glucose and how to give you a glucagon injection. Ask your health care provider if you need to have an emergency glucagon injection kit available. Severe hypoglycemia may need to be treated in a hospital. The treatment  may include getting glucose through an IV tube. You may also need treatment for the cause of your hypoglycemia. Can having diabetes put me at risk for other conditions? Having diabetes can put you at risk for other long-term (chronic) conditions, such as heart disease and kidney disease. Your health care provider may prescribe medicines to help prevent complications from diabetes. These medicines may include:  Aspirin.  Medicine to lower cholesterol.  Medicine to control blood pressure.  What else can I do to manage my diabetes? Take your diabetes medicines as told  If your health care provider prescribed insulin or diabetes medicines, take them every day.  Do not  run out of insulin or other diabetes medicines that you take. Plan ahead so you always have these available.  If you use insulin, adjust your dosage based on how physically active you are and what foods you eat. Your health care provider will tell you how to adjust your dosage. Make healthy food choices  The things that you eat and drink affect your blood glucose and your insulin dosage. Making good choices helps to control your diabetes and prevent other health problems. A healthy meal plan includes eating lean proteins, complex carbohydrates, fresh fruits and vegetables, low-fat dairy products, and healthy fats. Make an appointment to see a diet and nutrition specialist (registered dietitian) to help you create an eating plan that is right for you. Make sure that you:  Follow instructions from your health care provider about eating or drinking restrictions.  Drink enough fluid to keep your urine clear or pale yellow.  Eat healthy snacks between nutritious meals.  Track the carbohydrates that you eat. Do this by reading food labels and learning the standard serving sizes of foods.  Follow your sick day plan whenever you cannot eat or drink as usual. Make this plan in advance with your health care provider.  Stay  active  Exercise regularly, as told by your health care provider. This may include:  Stretching and doing strength exercises, such as yoga or weightlifting, at least 2 times a week.  Doing at least 150 minutes of moderate-intensity or vigorous-intensity exercise each week. This could be brisk walking, biking, or water aerobics. ? Spread out your activity over at least 3 days of the week. ? Do not go more than 2 days in a row without doing some kind of physical activity.  When you start a new exercise or activity, work with your health care provider to adjust your insulin, medicines, or food intake as needed. Make healthy lifestyle choices  Do not use any tobacco products, such as cigarettes, chewing tobacco, and e-cigarettes. If you need help quitting, ask your health care provider.  If your health care provider says that alcohol is safe for you, limit alcohol intake to no more than 1 drink per day for nonpregnant women and 2 drinks per day for men. One drink equals 12 oz of beer, 5 oz of wine, or 1 oz of hard liquor.  Learn to manage stress. If you need help with this, ask your health care provider. Care for your body   Keep your immunizations up to date. In addition to getting vaccinations as told by your health care provider, it is recommended that you get vaccinated against the following illnesses: ? The flu (influenza). Get a flu shot every year. ? Pneumonia. ? Hepatitis B.  Schedule an eye exam soon after your diagnosis, and then one time every year after that.  Check your skin and feet every day for cuts, bruises, redness, blisters, or sores. Schedule a foot exam with your health care provider once every year.  Brush your teeth and gums two times a day, and floss at least one time a day. Visit your dentist at least once every 6 months.  Maintain a healthy weight. General instructions  Take over-the-counter and prescription medicines only as told by your health care  provider.  Share your diabetes management plan with people in your workplace, school, and household.  Check your urine for ketones when you are ill and as told by your health care provider.  Ask your health care provider: ? Do I  need to meet with a diabetes educator? ? Where can I find a support group for people with diabetes?  Carry a medical alert card or wear medical alert jewelry.  Keep all follow-up visits as told by your health care provider. This is important. Where to find more information: For more information about diabetes, visit:  American Diabetes Association (ADA): www.diabetes.org  American Association of Diabetes Educators (AADE): www.diabeteseducator.org/patient-resources  This information is not intended to replace advice given to you by your health care provider. Make sure you discuss any questions you have with your health care provider. Document Released: 02/24/2016 Document Revised: 04/09/2016 Document Reviewed: 12/06/2015 Elsevier Interactive Patient Education  2017 [place].      Blood Glucose Monitoring, Adult Monitoring your blood sugar (glucose) helps you manage your diabetes. It also helps you and your health care provider determine how well your diabetes management plan is working. Blood glucose monitoring involves checking your blood glucose as often as directed, and keeping a record (log) of your results over time. Why should I monitor my blood glucose? Checking your blood glucose regularly can:  Help you understand how food, exercise, illnesses, and medicines affect your blood glucose.  Let you know what your blood glucose is at any time. You can quickly tell if you are having low blood glucose (hypoglycemia) or high blood glucose (hyperglycemia).  Help you and your health care provider adjust your medicines as needed.  When should I check my blood glucose? Follow instructions from your health care provider about how often to check your  blood glucose.   This may depend on:  The type of diabetes you have.  How well-controlled your diabetes is.  Medicines you are taking.  If you have type 1 diabetes:  Check your blood glucose at least 2 times a day.  Also check your blood glucose: ? Before every insulin injection. ? Before and after exercise. ? Between meals. ? 2 hours after a meal. ? Occasionally between 2:00 a.m. and 3:00 a.m., as directed. ? Before potentially dangerous tasks, like driving or using heavy machinery. ? At bedtime.  You may need to check your blood glucose more often, up to 6-10 times a day: ? If you use an insulin pump. ? If you need multiple daily injections (MDI). ? If your diabetes is not well-controlled. ? If you are ill. ? If you have a history of severe hypoglycemia. ? If you have a history of not knowing when your blood glucose is getting low (hypoglycemia unawareness).  If you have type 2 diabetes:  If you take insulin or other diabetes medicines, check your blood glucose at least 2 times a day.  If you are on intensive insulin therapy, check your blood glucose at least 4 times a day. Occasionally, you may also need to check between 2:00 a.m. and 3:00 a.m., as directed.  Also check your blood glucose: ? Before and after exercise. ? Before potentially dangerous tasks, like driving or using heavy machinery.  You may need to check your blood glucose more often if: ? Your medicine is being adjusted. ? Your diabetes is not well-controlled. ? You are ill.  What is a blood glucose log?  A blood glucose log is a record of your blood glucose readings. It helps you and your health care provider: ? Look for patterns in your blood glucose over time. ? Adjust your diabetes management plan as needed.  Every time you check your blood glucose, write down your result and notes  about things that may be affecting your blood glucose, such as your diet and exercise for the day.  Most glucose  meters store a record of glucose readings in the meter. Some meters allow you to download your records to a computer. How do I check my blood glucose? Follow these steps to get accurate readings of your blood glucose: Supplies needed   Blood glucose meter.  Test strips for your meter. Each meter has its own strips. You must use the strips that come with your meter.  A needle to prick your finger (lancet). Do not use lancets more than once.  A device that holds the lancet (lancing device).  A journal or log book to write down your results.  Procedure  Wash your hands with soap and water.  Prick the side of your finger (not the tip) with the lancet. Use a different finger each time.  Gently rub the finger until a small drop of blood appears.  Follow instructions that come with your meter for inserting the test strip, applying blood to the strip, and using your blood glucose meter.  Write down your result and any notes.  Alternative testing sites  Some meters allow you to use areas of your body other than your finger (alternative sites) to test your blood.  If you think you may have hypoglycemia, or if you have hypoglycemia unawareness, do not use alternative sites. Use your finger instead.  Alternative sites may not be as accurate as the fingers, because blood flow is slower in these areas. This means that the result you get may be delayed, and it may be different from the result that you would get from your finger.  The most common alternative sites are: ? Forearm. ? Thigh. ? Palm of the hand.  Additional tips  Always keep your supplies with you.  If you have questions or need help, all blood glucose meters have a 24-hour "hotline" number that you can call. You may also contact your health care provider.  After you use a few boxes of test strips, adjust (calibrate) your blood glucose meter by following instructions that came with your meter.    The American Diabetes  Association suggests the following targets for most nonpregnant adults with diabetes.  More or less stringent glycemic goals may be appropriate for each individual.  A1C: Less than 7% A1C may also be reported as eAG: Less than 154 mg/dl Before a meal (preprandial plasma glucose): 80-130 mg/dl 1-2 hours after beginning of the meal (Postprandial plasma glucose)*: Less than 180 mg/dl  *Postprandial glucose may be targeted if A1C goals are not met despite reaching preprandial glucose goals.   GOALS in short:  The goals are for the Hgb A1C to be less than 7.0 & blood pressure to be less than 130/80.    It is recommended that all diabetics are educated on and follow a healthy diabetic diet, exercise for 30 minutes 3-4 times per week (walking, biking, swimming, or machine), monitor blood glucose readings and bring that record with you to be reviewed at your next office visit.     You should be checking fasting blood sugars- especially after you eat poorly or eat really healthy, and also check 2 hour postprandial blood sugars after largest meal of the day.    Write these down and bring in your log at each office visit.    You will need to be seen every 3 months by the provider managing your Diabetes unless told otherwise  by that provider.   You will need yearly eye exams from an eye specialist and foot exams to check the nerves of your feet.  Also, your urine should be checked yearly as well to make sure excess protein is not present.   If you are checking your blood pressure at home, please record it and bring it to your next office visit.    Follow the Dietary Approaches to Stop Hypertension (DASH) diet (3 servings of fruit and vegetables daily, whole grains, low sodium, low-fat proteins).  See below.    Lastly, when it comes to your cholesterol, the goal is to have the HDL (good cholesterol) >40, and the LDL (bad cholesterol) <100.   It is recommended that you follow a heart healthy, low saturated  and trans-fat diet and exercise for 30 minutes at least 5 times a week.     (( Check out the DASH diet = 1.5 Gram Low Sodium Diet   A 1.5 gram sodium diet restricts the amount of sodium in the diet to no more than 1.5 g or 1500 mg daily.  The American Heart Association recommends Americans over the age of 30 to consume no more than 1500 mg of sodium each day to reduce the risk of developing high blood pressure.  Research also shows that limiting sodium may reduce heart attack and stroke risk.  Many foods contain sodium for flavor and sometimes as a preservative.  When the amount of sodium in a diet needs to be low, it is important to know what to look for when choosing foods and drinks.  The following includes some information and guidelines to help make it easier for you to adapt to a low sodium diet.    QUICK TIPS  Do not add salt to food.  Avoid convenience items and fast food.  Choose unsalted snack foods.  Buy lower sodium products, often labeled as "lower sodium" or "no salt added."  Check food labels to learn how much sodium is in 1 serving.  When eating at a restaurant, ask that your food be prepared with less salt or none, if possible.    READING FOOD LABELS FOR SODIUM INFORMATION  The nutrition facts label is a good place to find how much sodium is in foods. Look for products with no more than 400 mg of sodium per serving.  Remember that 1.5 g = 1500 mg.  The food label may also list foods as:  Sodium-free: Less than 5 mg in a serving.  Very low sodium: 35 mg or less in a serving.  Low-sodium: 140 mg or less in a serving.  Light in sodium: 50% less sodium in a serving. For example, if a food that usually has 300 mg of sodium is changed to become light in sodium, it will have 150 mg of sodium.  Reduced sodium: 25% less sodium in a serving. For example, if a food that usually has 400 mg of sodium is changed to reduced sodium, it will have 300 mg of sodium.    CHOOSING FOODS   Grains  Avoid: Salted crackers and snack items. Some cereals, including instant hot cereals. Bread stuffing and biscuit mixes. Seasoned rice or pasta mixes.  Choose: Unsalted snack items. Low-sodium cereals, oats, puffed wheat and rice, shredded wheat. English muffins and bread. Pasta.  Meats  Avoid: Salted, canned, smoked, spiced, pickled meats, including fish and poultry. Bacon, ham, sausage, cold cuts, hot dogs, anchovies.  Choose: Low-sodium canned tuna and salmon. Fresh or  frozen meat, poultry, and fish.  Dairy  Avoid: Processed cheese and spreads. Cottage cheese. Buttermilk and condensed milk. Regular cheese.  Choose: Milk. Low-sodium cottage cheese. Yogurt. Sour cream. Low-sodium cheese.  Fruits and Vegetables  Avoid: Regular canned vegetables. Regular canned tomato sauce and paste. Frozen vegetables in sauces. Olives. Angie Fava. Relishes. Sauerkraut.  Choose: Low-sodium canned vegetables. Low-sodium tomato sauce and paste. Frozen or fresh vegetables. Fresh and frozen fruit.  Condiments  Avoid: Canned and packaged gravies. Worcestershire sauce. Tartar sauce. Barbecue sauce. Soy sauce. Steak sauce. Ketchup. Onion, garlic, and table salt. Meat flavorings and tenderizers.  Choose: Fresh and dried herbs and spices. Low-sodium varieties of mustard and ketchup. Lemon juice. Tabasco sauce. Horseradish.    SAMPLE 1.5 GRAM SODIUM MEAL PLAN:   Breakfast / Sodium (mg)  1 cup low-fat milk / 143 mg  1 whole-wheat English muffin / 240 mg  1 tbs heart-healthy margarine / 153 mg  1 hard-boiled egg / 139 mg  1 small orange / 0 mg  Lunch / Sodium (mg)  1 cup raw carrots / 76 mg  2 tbs no salt added peanut butter / 5 mg  2 slices whole-wheat bread / 270 mg  1 tbs jelly / 6 mg   cup red grapes / 2 mg  Dinner / Sodium (mg)  1 cup whole-wheat pasta / 2 mg  1 cup low-sodium tomato sauce / 73 mg  3 oz lean ground beef / 57 mg  1 small side salad (1 cup raw spinach leaves,  cup cucumber,  cup  yellow bell pepper) with 1 tsp olive oil and 1 tsp red wine vinegar / 25 mg  Snack / Sodium (mg)  1 container low-fat vanilla yogurt / 107 mg  3 graham cracker squares / 127 mg  Nutrient Analysis  Calories: 1745  Protein: 75 g  Carbohydrate: 237 g  Fat: 57 g  Sodium: 1425 mg  Document Released: 11/02/2005 Document Revised: 07/15/2011 Document Reviewed: 02/03/2010  ExitCare Patient Information 2012 [place].))    This information is not intended to replace advice given to you by your health care provider. Make sure you discuss any questions you have with your health care provider. Document Released: 11/05/2003 Document Revised: 05/22/2016 Document Reviewed: 04/13/2016 Elsevier Interactive Patient Education  2017 [employer].

## 2018-01-12 ENCOUNTER — Encounter: Payer: Self-pay | Admitting: *Deleted

## 2018-01-12 NOTE — Progress Notes (Signed)
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 John states patient is cleared for [facility].

## 2018-01-25 ENCOUNTER — Other Ambulatory Visit: Payer: Self-pay

## 2018-01-25 ENCOUNTER — Ambulatory Visit (AMBULATORY_SURGERY_CENTER): Payer: Self-pay

## 2018-01-25 VITALS — Ht 72.0 in | Wt 195.0 lb

## 2018-01-25 DIAGNOSIS — Z86010 Personal history of colonic polyps: Secondary | ICD-10-CM

## 2018-01-25 NOTE — Progress Notes (Signed)
Denies allergies to eggs or soy products. Denies complication of anesthesia or sedation. Denies use of weight loss medication. Denies use of O2.   Emmi instructions declined.  

## 2018-02-07 DIAGNOSIS — M1712 Unilateral primary osteoarthritis, left knee: Secondary | ICD-10-CM | POA: Diagnosis not present

## 2018-02-08 ENCOUNTER — Ambulatory Visit (AMBULATORY_SURGERY_CENTER): Payer: Medicare Other | Admitting: Internal Medicine

## 2018-02-08 ENCOUNTER — Encounter: Payer: Self-pay | Admitting: Internal Medicine

## 2018-02-08 VITALS — BP 115/62 | HR 58 | Temp 97.3°F | Resp 17 | Ht 72.0 in | Wt 195.0 lb

## 2018-02-08 DIAGNOSIS — Z86010 Personal history of colonic polyps: Secondary | ICD-10-CM

## 2018-02-08 MED ORDER — SODIUM CHLORIDE 0.9 % IV SOLN: 500.0000 mL | Freq: Once | INTRAVENOUS | Status: DC

## 2018-02-08 NOTE — Progress Notes (Signed)
Spontaneous respirations throughout. VSS. Resting comfortably. To PACU on room air. Report to  RN. 

## 2018-02-08 NOTE — Patient Instructions (Addendum)
No polyps and no cancer seen today!  No more routine colon tests needed.  I appreciate the opportunity to care for you. Gatha Mayer, MD, FACG    YOU HAD AN ENDOSCOPIC PROCEDURE TODAY AT [facility]:   Refer to the procedure report that was given to you for any specific questions about what was found during the examination.  If the procedure report does not answer your questions, please call your gastroenterologist to clarify.  If you requested that your care partner not be given the details of your procedure findings, then the procedure report has been included in a sealed envelope for you to review at your convenience later.  YOU SHOULD EXPECT: Some feelings of bloating in the abdomen. Passage of more gas than usual.  Walking can help get rid of the air that was put into your GI tract during the procedure and reduce the bloating. If you had a lower endoscopy (such as a colonoscopy or flexible sigmoidoscopy) you may notice spotting of blood in your stool or on the toilet paper. If you underwent a bowel prep for your procedure, you may not have a normal bowel movement for a few days.  Please Note:  You might notice some irritation and congestion in your nose or some drainage.  This is from the oxygen used during your procedure.  There is no need for concern and it should clear up in a day or so.  SYMPTOMS TO REPORT IMMEDIATELY:   Following lower endoscopy (colonoscopy or flexible sigmoidoscopy):  Excessive amounts of blood in the stool  Significant tenderness or worsening of abdominal pains  Swelling of the abdomen that is new, acute  Fever of 100F or higher   For urgent or emergent issues, a gastroenterologist can be reached at any hour by calling [phone].   DIET:  We do recommend a small meal at first, but then you may proceed to your regular diet.  Drink plenty of fluids but you should avoid alcoholic beverages for 24 hours.  ACTIVITY:  You should  plan to take it easy for the rest of today and you should NOT DRIVE or use heavy machinery until tomorrow (because of the sedation medicines used during the test).    FOLLOW UP: Our staff will call the number listed on your records the next business day following your procedure to check on you and address any questions or concerns that you may have regarding the information given to you following your procedure. If we do not reach you, we will leave a message.  However, if you are feeling well and you are not experiencing any problems, there is no need to return our call.  We will assume that you have returned to your regular daily activities without incident.  If any biopsies were taken you will be contacted by phone or by letter within the next 1-3 weeks.  Please call us at [phone] if you have not heard about the biopsies in 3 weeks.    SIGNATURES/CONFIDENTIALITY: You and/or your care partner have signed paperwork which will be entered into your electronic medical record.  These signatures attest to the fact that that the information above on your After Visit Summary has been reviewed and is understood.  Full responsibility of the confidentiality of this discharge information lies with you and/or your care-partner.    Your blood sugar was 130 in the recovery room. You may resume your current medications today. No repeat screening colonoscopy due  to age.  Please call if any questions or concerns.

## 2018-02-08 NOTE — Op Note (Signed)
[facility] Patient Name: Nathan Gomez Procedure Date: 02/08/2018 9:08 AM MRN: [id number] Endoscopist: Gatha Mayer , MD Age: 77 Referring MD:  Date of Birth: 10-30-1941 Gender: Male Account #: [id number] Procedure:                Colonoscopy Indications:              Surveillance: Personal history of adenomatous                            polyps on last colonoscopy 5 years ago Medicines:                Propofol per Anesthesia, Monitored Anesthesia Care Procedure:                Pre-Anesthesia Assessment:                           - Prior to the procedure, a History and Physical                            was performed, and patient medications and                            allergies were reviewed. The patient's tolerance of                            previous anesthesia was also reviewed. The risks                            and benefits of the procedure and the sedation                            options and risks were discussed with the patient.                            All questions were answered, and informed consent                            was obtained. Prior Anticoagulants: The patient has                            taken no previous anticoagulant or antiplatelet                            agents. ASA Grade Assessment: III - A patient with                            severe systemic disease. After reviewing the risks                            and benefits, the patient was deemed in                            satisfactory condition to undergo the procedure.  After obtaining informed consent, the colonoscope                            was passed under direct vision. Throughout the                            procedure, the patient's blood pressure, pulse, and                            oxygen saturations were monitored continuously. The                            Colonoscope was introduced through the anus and   advanced to the the cecum, identified by                            appendiceal orifice and ileocecal valve. The                            colonoscopy was performed without difficulty. The                            patient tolerated the procedure well. The quality                            of the bowel preparation was good. The ileocecal                            valve, appendiceal orifice, and rectum were                            photographed. The bowel preparation used was                            Miralax. Scope In: 9:18:01 AM Scope Out: 9:31:46 AM Scope Withdrawal Time: 0 hours 11 minutes 15 seconds  Total Procedure Duration: 0 hours 13 minutes 45 seconds  Findings:                 The perianal and digital rectal examinations were                            normal.                           The colon (entire examined portion) appeared normal.                           The retroflexed view of the distal rectum and anal                            verge was normal and showed no anal or rectal                            abnormalities. Complications:  No immediate complications. Estimated Blood Loss:     Estimated blood loss: none. Impression:               - The entire examined colon is normal.                           - The distal rectum and anal verge are normal on                            retroflexion view.                           - No specimens collected. Recommendation:           - Patient has a contact number available for                            emergencies. The signs and symptoms of potential                            delayed complications were discussed with the                            patient. Return to normal activities tomorrow.                            Written discharge instructions were provided to the                            patient.                           - No repeat colonoscopy due to age and the absence                             of colonic polyps.                           - Resume previous diet.                           - Continue present medications. Gatha Mayer, MD 02/08/2018 9:37:47 AM This report has been signed electronically.

## 2018-02-08 NOTE — Progress Notes (Signed)
No problems noted in the recovery room. maw 

## 2018-02-08 NOTE — Progress Notes (Signed)
Pt has a friend that was called to pick he and his wife.  maw

## 2018-02-09 ENCOUNTER — Telehealth: Payer: Self-pay | Admitting: *Deleted

## 2018-02-09 NOTE — Telephone Encounter (Signed)
No answer, left message to call if questions or concerns. 

## 2018-02-09 NOTE — Telephone Encounter (Signed)
  Follow up Call-  Call back number 02/08/2018  Post procedure Call Back phone  # [phone]  Permission to leave phone message Yes  Some recent data might be hidden     Patient questions:  Do you have a fever, pain , or abdominal swelling? No. Pain Score  0 *  Have you tolerated food without any problems? Yes.    Have you been able to return to your normal activities? Yes.    Do you have any questions about your discharge instructions: Diet   No. Medications  No. Follow up visit  No  Do you have questions or concerns about your Care? No.  Actions: * If pain score is 4 or above: No action needed, pain <4.

## 2018-02-14 DIAGNOSIS — R972 Elevated prostate specific antigen [PSA]: Secondary | ICD-10-CM | POA: Diagnosis not present

## 2018-02-14 DIAGNOSIS — M1712 Unilateral primary osteoarthritis, left knee: Secondary | ICD-10-CM | POA: Diagnosis not present

## 2018-02-17 DIAGNOSIS — R972 Elevated prostate specific antigen [PSA]: Secondary | ICD-10-CM | POA: Diagnosis not present

## 2018-02-17 DIAGNOSIS — N5201 Erectile dysfunction due to arterial insufficiency: Secondary | ICD-10-CM | POA: Diagnosis not present

## 2018-02-21 DIAGNOSIS — M1712 Unilateral primary osteoarthritis, left knee: Secondary | ICD-10-CM | POA: Diagnosis not present

## 2018-02-24 ENCOUNTER — Other Ambulatory Visit: Payer: Self-pay

## 2018-02-24 DIAGNOSIS — M1711 Unilateral primary osteoarthritis, right knee: Secondary | ICD-10-CM

## 2018-02-24 DIAGNOSIS — E785 Hyperlipidemia, unspecified: Principal | ICD-10-CM

## 2018-02-24 DIAGNOSIS — E1169 Type 2 diabetes mellitus with other specified complication: Secondary | ICD-10-CM

## 2018-02-24 MED ORDER — ATORVASTATIN CALCIUM 20 MG PO TABS: 20.0000 mg | ORAL_TABLET | Freq: Every day | ORAL | 1 refills | Status: DC

## 2018-02-24 MED ORDER — MELOXICAM 15 MG PO TABS: 15.0000 mg | ORAL_TABLET | Freq: Every day | ORAL | 1 refills | Status: DC

## 2018-03-21 DIAGNOSIS — L570 Actinic keratosis: Secondary | ICD-10-CM | POA: Diagnosis not present

## 2018-04-06 ENCOUNTER — Other Ambulatory Visit: Payer: Medicare Other

## 2018-04-08 ENCOUNTER — Other Ambulatory Visit (INDEPENDENT_AMBULATORY_CARE_PROVIDER_SITE_OTHER): Payer: Medicare Other

## 2018-04-08 DIAGNOSIS — E1159 Type 2 diabetes mellitus with other circulatory complications: Secondary | ICD-10-CM

## 2018-04-08 DIAGNOSIS — E1169 Type 2 diabetes mellitus with other specified complication: Secondary | ICD-10-CM

## 2018-04-08 DIAGNOSIS — I10 Essential (primary) hypertension: Secondary | ICD-10-CM | POA: Diagnosis not present

## 2018-04-08 DIAGNOSIS — E559 Vitamin D deficiency, unspecified: Secondary | ICD-10-CM

## 2018-04-08 DIAGNOSIS — I129 Hypertensive chronic kidney disease with stage 1 through stage 4 chronic kidney disease, or unspecified chronic kidney disease: Secondary | ICD-10-CM

## 2018-04-08 DIAGNOSIS — E538 Deficiency of other specified B group vitamins: Secondary | ICD-10-CM | POA: Diagnosis not present

## 2018-04-08 DIAGNOSIS — E1129 Type 2 diabetes mellitus with other diabetic kidney complication: Secondary | ICD-10-CM | POA: Diagnosis not present

## 2018-04-08 DIAGNOSIS — E1122 Type 2 diabetes mellitus with diabetic chronic kidney disease: Secondary | ICD-10-CM | POA: Diagnosis not present

## 2018-04-08 DIAGNOSIS — N183 Chronic kidney disease, stage 3 unspecified: Secondary | ICD-10-CM

## 2018-04-08 DIAGNOSIS — E785 Hyperlipidemia, unspecified: Secondary | ICD-10-CM | POA: Diagnosis not present

## 2018-04-08 DIAGNOSIS — I152 Hypertension secondary to endocrine disorders: Secondary | ICD-10-CM

## 2018-04-09 LAB — LIPID PANEL
Chol/HDL Ratio: 3.7 ratio (ref 0.0–5.0)
Cholesterol, Total: 145 mg/dL (ref 100–199)
HDL: 39 mg/dL — ABNORMAL LOW (ref >39)
LDL Calculated: 78 mg/dL (ref 0–99)
Triglycerides: 139 mg/dL (ref 0–149)
VLDL Cholesterol Cal: 28 mg/dL (ref 5–40)

## 2018-04-09 LAB — CBC WITH DIFFERENTIAL/PLATELET
Basophils Absolute: 0 10*3/uL (ref 0.0–0.2)
Basos: 0 %
EOS (ABSOLUTE): 0.1 10*3/uL (ref 0.0–0.4)
Eos: 2 %
Hematocrit: 38.2 % (ref 37.5–51.0)
Hemoglobin: 12.3 g/dL — ABNORMAL LOW (ref 13.0–17.7)
Immature Grans (Abs): 0 10*3/uL (ref 0.0–0.1)
Immature Granulocytes: 0 %
Lymphocytes Absolute: 1.5 10*3/uL (ref 0.7–3.1)
Lymphs: 24 %
MCH: 30.8 pg (ref 26.6–33.0)
MCHC: 32.2 g/dL (ref 31.5–35.7)
MCV: 96 fL (ref 79–97)
Monocytes Absolute: 0.6 10*3/uL (ref 0.1–0.9)
Monocytes: 9 %
Neutrophils Absolute: 4.2 10*3/uL (ref 1.4–7.0)
Neutrophils: 65 %
Platelets: 253 10*3/uL (ref 150–450)
RBC: 4 x10E6/uL — ABNORMAL LOW (ref 4.14–5.80)
RDW: 14 % (ref 12.3–15.4)
WBC: 6.5 10*3/uL (ref 3.4–10.8)

## 2018-04-09 LAB — PHOSPHORUS: Phosphorus: 3.5 mg/dL (ref 2.5–4.5)

## 2018-04-09 LAB — TSH: TSH: 3 u[IU]/mL (ref 0.450–4.500)

## 2018-04-09 LAB — MAGNESIUM: Magnesium: 1.2 mg/dL — ABNORMAL LOW (ref 1.6–2.3)

## 2018-04-09 LAB — MICROALBUMIN / CREATININE URINE RATIO
Creatinine, Urine: 59.6 mg/dL
Microalb/Creat Ratio: 34.9 mg/g creat — ABNORMAL HIGH (ref 0.0–30.0)
Microalbumin, Urine: 20.8 ug/mL

## 2018-04-09 LAB — HEMOGLOBIN A1C
Est. average glucose Bld gHb Est-mCnc: 128 mg/dL
Hgb A1c MFr Bld: 6.1 % — ABNORMAL HIGH (ref 4.8–5.6)

## 2018-04-09 LAB — VITAMIN B12: Vitamin B-12: 1550 pg/mL — ABNORMAL HIGH (ref 232–1245)

## 2018-04-09 LAB — T4, FREE: Free T4: 1.22 ng/dL (ref 0.82–1.77)

## 2018-04-09 LAB — VITAMIN D 25 HYDROXY (VIT D DEFICIENCY, FRACTURES): Vit D, 25-Hydroxy: 69.4 ng/mL (ref 30.0–100.0)

## 2018-04-13 ENCOUNTER — Ambulatory Visit (INDEPENDENT_AMBULATORY_CARE_PROVIDER_SITE_OTHER): Payer: Medicare Other | Admitting: Family Medicine

## 2018-04-13 ENCOUNTER — Encounter: Payer: Self-pay | Admitting: Family Medicine

## 2018-04-13 VITALS — BP 100/66 | HR 77 | Ht 72.0 in | Wt 189.8 lb

## 2018-04-13 DIAGNOSIS — I10 Essential (primary) hypertension: Secondary | ICD-10-CM | POA: Diagnosis not present

## 2018-04-13 DIAGNOSIS — N183 Chronic kidney disease, stage 3 unspecified: Secondary | ICD-10-CM

## 2018-04-13 DIAGNOSIS — M1711 Unilateral primary osteoarthritis, right knee: Secondary | ICD-10-CM | POA: Diagnosis not present

## 2018-04-13 DIAGNOSIS — E538 Deficiency of other specified B group vitamins: Secondary | ICD-10-CM | POA: Diagnosis not present

## 2018-04-13 DIAGNOSIS — I152 Hypertension secondary to endocrine disorders: Secondary | ICD-10-CM

## 2018-04-13 DIAGNOSIS — E1169 Type 2 diabetes mellitus with other specified complication: Secondary | ICD-10-CM | POA: Diagnosis not present

## 2018-04-13 DIAGNOSIS — E1159 Type 2 diabetes mellitus with other circulatory complications: Secondary | ICD-10-CM

## 2018-04-13 DIAGNOSIS — E1122 Type 2 diabetes mellitus with diabetic chronic kidney disease: Secondary | ICD-10-CM | POA: Diagnosis not present

## 2018-04-13 DIAGNOSIS — E785 Hyperlipidemia, unspecified: Secondary | ICD-10-CM | POA: Diagnosis not present

## 2018-04-13 DIAGNOSIS — I129 Hypertensive chronic kidney disease with stage 1 through stage 4 chronic kidney disease, or unspecified chronic kidney disease: Secondary | ICD-10-CM

## 2018-04-13 DIAGNOSIS — D510 Vitamin B12 deficiency anemia due to intrinsic factor deficiency: Secondary | ICD-10-CM

## 2018-04-13 DIAGNOSIS — E559 Vitamin D deficiency, unspecified: Secondary | ICD-10-CM | POA: Diagnosis not present

## 2018-04-13 DIAGNOSIS — E1129 Type 2 diabetes mellitus with other diabetic kidney complication: Secondary | ICD-10-CM

## 2018-04-13 MED ORDER — METFORMIN HCL ER 500 MG PO TB24: ORAL_TABLET | ORAL | 1 refills | Status: DC

## 2018-04-13 MED ORDER — MAGNESIUM OXIDE 400 MG PO TABS: 400.0000 mg | ORAL_TABLET | Freq: Three times a day (TID) | ORAL | 3 refills | Status: DC

## 2018-04-13 NOTE — Progress Notes (Signed)
Assessment and plan:  1. Hypertension associated with diabetes (Nathan Gomez)   2. 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 stage 3 chronic kidney disease due to type 2 diabetes mellitus (Nathan Gomez)   4. Hyperlipidemia associated with type 2 diabetes mellitus (Nathan Gomez)   5. Pernicious anemia   6. B12 deficiency   7. Hypomagnesemia   8. Primary osteoarthritis of right knee   9. Vitamin D deficiency     1. HTN -BP well controlled at this time. Pt is completely asymptomatic.  -continue meds as listed below.  -check your BP at home and keep a log. If your BP runs too low and you develop symptoms like dizziness/light-headedness, call the office and let us know.  -prudent diet/exercise discussed.  -AHA exercise and dietary guidelines discussed.  2. DM2 -A1c 04-08-18 was 6.1, improved from prior where it was 6.6 on 12-14-17.  -BS are under great control at this time.  -reduce metformin from 2 tablets BID to 1 tablet BID to better control serum creatinine. -continue prudent diet/exercise. Reduce intake of sweets and other carbohydrates.   3. Stage 3 CKD due to DM2  -D/C meloxicam. Use tylenol instead.  -Discussed with pt in great detail the need to slow progression of his CKD. Discussed in detail desire to decrease metformin with EGFR around 45, which pt is resistant to do. However, he agrees to reduce his metformin to a half dose. -Control BS/BP. Recheck 4 months. -drink adequate amounts of water per day, equal to half of your wt in oz.  -add CMP today.  4. HLD -triglycerides, LDL, and HDL are all much improved from prior. Great job! -continue prudent diet and exercise.  -Control BS. -continue meds as listed below.  5. Pernicious anemia -continue to monitor. Continue B12 supplements as listed below.  6. B12 deficiency -reduce B12 by half (by  taking every other day instead of daily).  7. Hypo Mag  -increase magnesium to 400 mg TID, up from 400 mg BID.  -Take omeprazole and metformin at different times from when you take your magnesium.  -believe mag is low because of SE of his other medications.   8. OA of knee -D/C meloxicam. Use tylenol instead for pain. -this is a chronic issue for him. -followed by Nathan Gomez, orthopedics. -ice your knee regularly.   9. Vit D deficiency -continue supplements.      Education and routine counseling performed. Handouts provided.  Orders Placed This Encounter  Procedures  . Comprehensive metabolic panel    Meds ordered this encounter  Medications  . metFORMIN (GLUCOPHAGE-XR) 500 MG 24 hr tablet    Sig: 500 MG q 12 hrs daily    Dispense:  180 tablet    Refill:  1  . magnesium oxide (MAG-OX) 400 MG tablet    Sig: Take 1 tablet (400 mg total) by mouth 3 (three) times daily.    Dispense:  360 tablet    Refill:  3    Member ID [id number]     Return for 3-4 mo; reck CMP, A1c, Mag, B12; we stopped mobic and dec metformin, inc mag and dec b12 dose.   Anticipatory guidance and routine counseling done re: condition, txmnt options and need for follow up. All questions of patient's were answered.   Gross side effects, risk and benefits, and alternatives of medications discussed with patient.  Patient is aware that all medications  have potential side effects and we are unable to predict every sideeffect or drug-drug interaction that may occur.  Expresses verbal understanding and consents to current therapy plan and treatment regiment.  Pt was in the office today for 45+ minutes, with over 50% time spent in face to face counseling of patients various medical conditions, treatment plans of those medical conditions including Gomez management and lifestyle modification, strategies to improve health and well being; and in coordination of care. SEE ABOVE TREATMENT PLAN FOR  DETAILS  Please see AVS handed out to patient at the end of our visit for additional patient instructions/ counseling done pertaining to today's office visit.  Note: This document was prepared using Dragon voice recognition software and may include unintentional dictation errors.  This document serves as a record of services personally performed by Nathan Dance, DO. It was created on her behalf by Nathan Gomez, a trained medical scribe. The creation of this record is based on the scribe's personal observations and the provider's statements to them.   I have reviewed the above medical documentation for accuracy and completeness and I concur.  Nathan Gomez 04/21/18 10:58 AM   ----------------------------------------------------------------------------------------------------------------------  Subjective:   CC:   Nathan Gomez is a 77 y.o. male who presents to [facility] today for review and discussion of recent bloodwork that was done.  1. All recent blood work that we ordered was reviewed with patient today.  Patient was counseled on all abnormalities and we discussed dietary and lifestyle changes that could help those values (also medications when appropriate).  Extensive health counseling performed and all patient's concerns/ questions were addressed.    BP He denies dizziness and light-headedness today.  He does not check his BP at home regularly, but when he does, the systolic is in the 758-832P.   B12  He takes one per day for about one year.   Mag He takes 400 mg BID   Supplements He is only taking multivitamin senior, not any iron supplements.   Knee Per pt he was on celebrex but stopped after switching to meloxicam. He takes tylenol arthritis for pain and to help when he sleeps. He has taken meloxicam for about 1 year.     Wt Readings from Last 3 Encounters:  04/13/18 189 lb 12.8 oz (86.1 kg)  02/08/18 195 lb (88.5 kg)   01/25/18 195 lb (88.5 kg)   BP Readings from Last 3 Encounters:  04/13/18 100/66  02/08/18 115/62  12/14/17 137/78   Pulse Readings from Last 3 Encounters:  04/13/18 77  02/08/18 (!) 58  12/14/17 76   BMI Readings from Last 3 Encounters:  04/13/18 25.74 kg/m  02/08/18 26.45 kg/m  01/25/18 26.45 kg/m     Patient Care Team    Relationship Specialty Notifications Start End  Nathan Dance, DO PCP - General Family Gomez  04/08/17   Gatha Mayer, MD Consulting Physician Gastroenterology  09/28/11   Darleen Crocker, MD  Ophthalmology  09/16/12   Melrose Nakayama, MD  Orthopedic Surgery  12/05/13   Kathie Rhodes, MD  Urology  07/30/15   Katy Apo, MD Consulting Physician Ophthalmology  04/08/17   Arlyss Gandy, PA-C  Dermatology  09/13/17     Full medical history updated and reviewed in the office today  Patient Active Problem List   Diagnosis Date Noted  . stage 3 chronic kidney disease due to type 2 diabetes mellitus ([place]) 02/22/2017    Priority: High  .  Diabetes mellitus type 2, controlled ([place]) 01/05/2011    Priority: High  . Hyperlipidemia associated with type 2 diabetes mellitus ([place]) 01/05/2011    Priority: High  . Hypertension associated with diabetes ([place]) 01/05/2011    Priority: High  . GERD (gastroesophageal reflux disease) 09/12/2017    Priority: Medium  . Urinary retention 07/30/2015    Priority: Medium  . Personal history of colonic adenomas 01/16/2013    Priority: Medium  . Hypomagnesemia 04/08/2016    Priority: Low  . B12 deficiency 01/30/2016    Priority: Low  . Pernicious anemia 02/22/2012    Priority: Low  . Anemia     Priority: Low  . Osteoarthritis 01/05/2011    Priority: Low  . Vitamin D deficiency 09/12/2017  . Ruptured, tendon, Achilles, right, sequela 04/08/2017  . Elevated PSA 08/25/2016  . Primary osteoarthritis of right knee 07/09/2015    Past Medical History:  Diagnosis Date  . Achilles tendinitis   . Allergy    . Cataract   . Chronic kidney disease   . Complication of anesthesia    urinary retention  . 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 Diabetes mellitus, type 2 ([place])   . Dyslipidemia   . GERD (gastroesophageal reflux disease)   . Heart murmur    child  . Hyperlipidemia   . Hypertension   . Osteoarthritis    right knee  . Personal history of colonic adenomas 01/16/2013  . Plantar fasciitis     Past Surgical History:  Procedure Laterality Date  . CATARACT EXTRACTION  08/2012 L, 09/2012 R  . COLONOSCOPY    . TONSILLECTOMY  1945  . TONSILLECTOMY    . TOTAL HIP ARTHROPLASTY  2011   Left  . TOTAL HIP ARTHROPLASTY Right 07/09/2015   Procedure: TOTAL HIP ARTHROPLASTY ANTERIOR APPROACH;  Surgeon: Melrose Nakayama, MD;  Location: [facility];  Service: Orthopedics;  Laterality: Right;  . TOTAL KNEE ARTHROPLASTY Right 07/21/2016   Procedure: RIGHT TOTAL KNEE ARTHROPLASTY;  Surgeon: Melrose Nakayama, MD;  Location: [facility];  Service: Orthopedics;  Laterality: Right;    Social History   Tobacco Use  . Smoking status: Former Smoker    Packs/day: 1.00    Years: 20.00    Pack years: 20.00    Last attempt to quit: 11/16/1986    Years since quitting: 31.4  . Smokeless tobacco: Never Used  Substance Use Topics  . Alcohol use: Yes    Alcohol/week: 0.6 - 1.2 oz    Types: 1 - 2 Glasses of wine per week    Family Hx: Family History  Problem Relation Age of Onset  . Ovarian cancer Mother   . Hypertension Father   . Diabetes Maternal Grandfather   . Pneumonia Paternal Grandfather   . Colon cancer Neg Hx   . Esophageal cancer Neg Hx   . Liver cancer Neg Hx   . Pancreatic cancer Neg Hx   . Rectal cancer Neg Hx   . Stomach cancer Neg Hx      Medications: Current Outpatient Medications  Medication Sig Dispense Refill  . amLODipine (NORVASC) 10 MG tablet Take 1 tablet (10 mg total) by mouth daily. 90 tablet 1  . atorvastatin (LIPITOR) 20 MG tablet Take 1 tablet (20 mg total) by mouth  at bedtime. 90 tablet 1  . blood glucose meter kit and supplies KIT Use to test blood sugar up to twice a day. DX E11.09 1 each 0  . Cholecalciferol (VITAMIN D) 2000 units  CAPS Take 1 capsule (2,000 Units total) by mouth every morning. 90 capsule 3  . Cyanocobalamin (VITAMIN B12 PO) Take 1 tablet by mouth daily.    Marland Kitchen GLUCOSAMINE SULFATE PO Take 1 tablet by mouth daily.    Marland Kitchen glucose blood test strip Use as instructed to test blood sugar up to twice a day. DX:  E11.09 200 each 3  . hydrochlorothiazide (HYDRODIURIL) 25 MG tablet TAKE 1 TABLET DAILY 90 tablet 1  . Krill Oil 300 MG CAPS Take 500 mg by mouth daily.     . Lancets 15V MISC Delica Fine Lancets. Use to test blood sugar up to twice a day. DX E11.09 200 each 3  . lisinopril (PRINIVIL,ZESTRIL) 20 MG tablet Take 1 tablet (20 mg total) by mouth daily. 90 tablet 1  . magnesium oxide (MAG-OX) 400 MG tablet Take 1 tablet (400 mg total) by mouth 3 (three) times daily. 360 tablet 3  . metFORMIN (GLUCOPHAGE-XR) 500 MG 24 hr tablet 500 MG q 12 hrs daily 180 tablet 1  . Multiple Vitamins-Minerals (SENIOR MULTIVITAMIN PLUS) TABS Take 1 tablet by mouth daily.      Marland Kitchen omeprazole (PRILOSEC) 20 MG capsule Take 1 capsule (20 mg total) by mouth at bedtime. (Patient taking differently: Take 20 mg by mouth every other day. ) 90 capsule 3  . polyethylene glycol powder (GLYCOLAX/MIRALAX) powder Take 1 Container by mouth once.    . ranitidine (ZANTAC) 150 MG tablet Take 150 mg by mouth 2 (two) times daily.    . tamsulosin (FLOMAX) 0.4 MG CAPS capsule Take 1 capsule (0.4 mg total) by mouth at bedtime. (Patient taking differently: Take 0.4-0.8 mg by mouth See admin instructions. Take 0.4 mg by mouth daily. Starting 7 days before procedure take 0.1m by mouth daily) 14 capsule 0  . Triamcinolone Acetonide (NASACORT AQ NA) Place 1 spray into the nose daily as needed (allergies).    . loratadine (CLARITIN) 10 MG tablet Take 1 tablet (10 mg total) by mouth daily. (Patient  taking differently: Take 10 mg by mouth daily as needed for allergies. ) 90 tablet 3   Current Facility-Administered Medications  Medication Dose Route Frequency Provider Last Rate Last Dose  . 0.9 %  sodium chloride infusion  500 mL Intravenous Once GGatha Mayer MD        Allergies:  Allergies  Allergen Reactions  . Nsaids     Kidney Disease  . Sulfa Antibiotics Other (See Comments)    As child    Not sure rxn  . Thiazide-Type Diuretics Other (See Comments)    Unknown     Review of Systems: General:   No F/C, wt loss Pulm:   No DIB, SOB, pleuritic chest pain Card:  No CP, palpitations Abd:  No n/v/d or pain Ext:  No inc edema from baseline  Objective:  Blood pressure 100/66, pulse 77, height 6' (1.829 m), weight 189 lb 12.8 oz (86.1 kg), SpO2 97 %. Body mass index is 25.74 kg/m. Gen:   Well NAD, A and O *3 HEENT:    [place]/AT, EOMI,  MMM Lungs:   Normal work of breathing. CTA B/L, no Wh, rhonchi Heart:   RRR, S1, S2 WNL's, no MRG Abd:   No gross distention Exts:    warm, pink,  Brisk capillary refill, warm and well perfused.  Psych:    No HI/SI, judgement and insight good, Euthymic mood. Full Affect.   Recent Results (from the past 2160 hour(s))  Microalbumin / creatinine  urine ratio     Status: Abnormal   Collection Time: 04/08/18  8:07 AM  Result Value Ref Range   Creatinine, Urine 59.6 Not Estab. mg/dL   Microalbumin, Urine 20.8 Not Estab. ug/mL   Microalb/Creat Ratio 34.9 (H) 0.0 - 30.0 mg/g creat    Comment:                      Normal:                0.0 -  30.0                      Albuminuria:          31.0 - 300.0                      Clinical albuminuria:       >300.0   VITAMIN D 25 Hydroxy (Vit-D Deficiency, Fractures)     Status: None   Collection Time: 04/08/18  8:07 AM  Result Value Ref Range   Vit D, 25-Hydroxy 69.4 30.0 - 100.0 ng/mL    Comment: Vitamin D deficiency has been defined by the [facility] and an Endocrine Society practice  guideline as a level of serum 25-OH vitamin D less than 20 ng/mL (1,2). The Endocrine Society went on to further define vitamin D insufficiency as a level between 21 and 29 ng/mL (2). 1. IOM (Institute of [facility]). 2010. Dietary reference    intakes for calcium and D. [place]: The    [employer]. 2. Holick MF, Binkley [place], Bischoff-Ferrari HA, et al.    Evaluation, treatment, and prevention of vitamin D    deficiency: an Endocrine Society clinical practice    guideline. JCEM. 2011 Jul; 96(7):1911-30.   Phosphorus     Status: None   Collection Time: 04/08/18  8:07 AM  Result Value Ref Range   Phosphorus 3.5 2.5 - 4.5 mg/dL  Magnesium     Status: Abnormal   Collection Time: 04/08/18  8:07 AM  Result Value Ref Range   Magnesium 1.2 (L) 1.6 - 2.3 mg/dL  TSH     Status: None   Collection Time: 04/08/18  8:07 AM  Result Value Ref Range   TSH 3.000 0.450 - 4.500 uIU/mL  T4, free     Status: None   Collection Time: 04/08/18  8:07 AM  Result Value Ref Range   Free T4 1.22 0.82 - 1.77 ng/dL  Lipid panel     Status: Abnormal   Collection Time: 04/08/18  8:07 AM  Result Value Ref Range   Cholesterol, Total 145 100 - 199 mg/dL   Triglycerides 139 0 - 149 mg/dL   HDL 39 (L) >39 mg/dL   VLDL Cholesterol Cal 28 5 - 40 mg/dL   LDL Calculated 78 0 - 99 mg/dL   Chol/HDL Ratio 3.7 0.0 - 5.0 ratio    Comment:                                   T. Chol/HDL Ratio                                             Men  Women  1/2 Avg.Risk  3.4    3.3                                   Avg.Risk  5.0    4.4                                2X Avg.Risk  9.6    7.1                                3X Avg.Risk 23.4   11.0   CBC with Differential/Platelet     Status: Abnormal   Collection Time: 04/08/18  8:07 AM  Result Value Ref Range   WBC 6.5 3.4 - 10.8 x10E3/uL   RBC 4.00 (L) 4.14 - 5.80 x10E6/uL   Hemoglobin 12.3 (L) 13.0 - 17.7 g/dL   Hematocrit 38.2 37.5  - 51.0 %   MCV 96 79 - 97 fL   MCH 30.8 26.6 - 33.0 pg   MCHC 32.2 31.5 - 35.7 g/dL   RDW 14.0 12.3 - 15.4 %   Platelets 253 150 - 450 x10E3/uL    Comment:               **Please note reference interval change**   Neutrophils 65 Not Estab. %   Lymphs 24 Not Estab. %   Monocytes 9 Not Estab. %   Eos 2 Not Estab. %   Basos 0 Not Estab. %   Neutrophils Absolute 4.2 1.4 - 7.0 x10E3/uL   Lymphocytes Absolute 1.5 0.7 - 3.1 x10E3/uL   Monocytes Absolute 0.6 0.1 - 0.9 x10E3/uL   EOS (ABSOLUTE) 0.1 0.0 - 0.4 x10E3/uL   Basophils Absolute 0.0 0.0 - 0.2 x10E3/uL   Immature Granulocytes 0 Not Estab. %   Immature Grans (Abs) 0.0 0.0 - 0.1 x10E3/uL  Hemoglobin A1c     Status: Abnormal   Collection Time: 04/08/18  8:07 AM  Result Value Ref Range   Hgb A1c MFr Bld 6.1 (H) 4.8 - 5.6 %    Comment:          Prediabetes: 5.7 - 6.4          Diabetes: >6.4          Glycemic control for adults with diabetes: <7.0    Est. average glucose Bld gHb Est-mCnc 128 mg/dL  Vitamin B12     Status: Abnormal   Collection Time: 04/08/18  8:07 AM  Result Value Ref Range   Vitamin B-12 1,550 (H) 232 - 1,245 pg/mL  Comprehensive metabolic panel     Status: Abnormal   Collection Time: 04/08/18  8:07 AM  Result Value Ref Range   Glucose 111 (H) 65 - 99 mg/dL   BUN 28 (H) 8 - 27 mg/dL   Creatinine, Ser 1.36 (H) 0.76 - 1.27 mg/dL   GFR calc non Af Amer 49 (L) >59 mL/min/1.73   GFR calc Af Amer 57 (L) >59 mL/min/1.73   BUN/Creatinine Ratio 21 10 - 24   Sodium 140 134 - 144 mmol/L   Potassium 5.1 3.5 - 5.2 mmol/L   Chloride 106 96 - 106 mmol/L   CO2 15 (L) 20 - 29 mmol/L   Calcium 9.7 8.6 - 10.2 mg/dL   Total Protein 6.4 6.0 - 8.5 g/dL   Albumin 4.4  3.5 - 4.8 g/dL   Globulin, Total 2.0 1.5 - 4.5 g/dL   Albumin/Globulin Ratio 2.2 1.2 - 2.2   Bilirubin Total <0.2 0.0 - 1.2 mg/dL   Alkaline Phosphatase 80 39 - 117 IU/L   AST 15 0 - 40 IU/L   ALT 15 0 - 44 IU/L  Specimen status report     Status: None    Collection Time: 04/08/18  8:07 AM  Result Value Ref Range   specimen status report Comment     Comment: Written Authorization Written Authorization Written Authorization Received. Authorization received from [place] 04-15-2018 Logged by Lenice Llamas

## 2018-04-13 NOTE — Patient Instructions (Addendum)
-reduce your Vitamin B12 supplements by half (by taking every other day instead of daily).  -Take your mag instead of 400 mg twice daily, take 400mg  3 times daily.  -Change metformin from 1000 XR twice daily to 500 mg of the XR twice daily.    this will be half the dose that you are on, and I recommend we recheck your kidney function in 4 months.   -Please stop your Mobic altogether as this can be contributing to kidney dysfunction.  Please use Tylenol as needed for your arthritis as well as ice your knees which helps very much with pain and arthritic symptoms      Chronic Kidney Disease, Adult Chronic kidney disease (CKD) occurs when the kidneys become damaged slowly over a long period of time. The kidneys are a pair of organs that do many important jobs in the body, including:  Removing waste and extra fluid from the blood to make urine.  Making hormones that maintain the amount of fluid in tissues and blood vessels.  Maintaining the right amount of fluids and chemicals in the body.  A small amount of kidney damage may not cause problems, but a large amount of damage may make it hard or impossible for the kidneys to work the way they should. If steps are not taken to slow down kidney damage or to stop it from getting worse, the kidneys may stop working permanently (end-stage renal disease or ESRD). Most of the time, CKD does not go away, but it can often be controlled. People who have CKD are usually able to live normal lives. What are the causes? The most common causes of this condition are diabetes and high blood pressure (hypertension). Other causes include:  Heart and blood vessel (cardiovascular) disease.  Kidney diseases, such as: ? Glomerulonephritis. ? Interstitial nephritis. ? Polycystic kidney disease. ? Renal vascular disease.  Diseases that affect the immune system.  Genetic diseases.  Medicines that damage the kidneys, such as anti-inflammatory medicines.  Being  around or being in contact with poisonous (toxic) substances.  A kidney or urinary infection that occurs again and again (recurs).  Vasculitis. This is swelling or inflammation of the blood vessels.  A problem with urine flow that may be caused by: ? Cancer. ? Having kidney stones more than one time. ? An enlarged prostate, in males.  What increases the risk? You are more likely to develop this condition if you:  Are older than age 3.  Are male.  Are African-American, Hispanic, Asian, [place], or American [place].  Are a current or former smoker.  Are obese.  Have a family history of kidney disease or failure.  Often take medicines that are damaging to the kidneys.  What are the signs or symptoms? Symptoms of this condition include:  Swelling (edema) of the face, legs, ankles, or feet.  Tiredness (lethargy) and having less energy.  Nausea or vomiting.  Confusion or trouble concentrating.  Problems with urination, such as: ? Painful or burning feeling during urination. ? Decreased urine production. ? Frequent urination, especially at night. ? Bloody urine.  Muscle twitches and cramps, especially in the legs.  Shortness of breath.  Weakness.  Loss of appetite.  Metallic taste in the mouth.  Trouble sleeping.  Dry, itchy skin.  A low blood count (anemia).  Pale lining of the eyelids and surface of the eye (conjunctiva).  Symptoms develop slowly and may not be obvious until the kidney damage becomes severe. It is possible to  have kidney disease for years without having any symptoms. How is this diagnosed? This condition may be diagnosed based on:  Blood tests.  Urine tests.  Imaging tests, such as an ultrasound or CT scan.  A test in which a sample of tissue is removed from the kidneys to be examined under a microscope (kidney biopsy).  These test results will help your health care provider determine how serious the CKD is. How is  this treated? There is no cure for most cases of this condition, but treatment usually relieves symptoms and prevents or slows the progression of the disease. Treatment may include:  Making diet changes, which may require you to avoid alcohol, salty foods (sodium), and foods that are high in potassium, calcium, and protein.  Medicines: ? To lower blood pressure. ? To control blood glucose. ? To relieve anemia. ? To relieve swelling. ? To protect your bones. ? To improve the balance of electrolytes in your blood.  Removing toxic waste from the body through types of dialysis, if the kidneys can no longer do their job (kidney failure).  Managing any other conditions that are causing your CKD or making it worse.  Follow these instructions at home: Medicines  Take over-the-counter and prescription medicines only as told by your health care provider. The dose of some medicines that you take may need to be adjusted.  Do not take any new medicines unless approved by your health care provider. Many medicines can worsen your kidney damage.  Do not take any vitamin and mineral supplements unless approved by your health care provider. Many nutritional supplements can worsen your kidney damage. General instructions  Follow your prescribed diet as told by your health care provider.  Do not use any products that contain nicotine or tobacco, such as cigarettes and e-cigarettes. If you need help quitting, ask your health care provider.  Monitor and track your blood pressure at home. Report changes in your blood pressure as told by your health care provider.  If you are being treated for diabetes, monitor and track your blood sugar (blood glucose) levels as told by your health care provider.  Maintain a healthy weight. If you need help with this, ask your health care provider.  Start or continue an exercise plan. Exercise at least 30 minutes a day, 5 days a week.  Keep your immunizations up to  date as told by your health care provider.  Keep all follow-up visits as told by your health care provider. This is important. Where to find more information:  American Association of Kidney Patients: BombTimer.gl  National Kidney Foundation: www.kidney.[place]: https://mathis.com/  [facility]: www.lifeoptions.org and www.kidneyschool.org Contact a health care provider if:  Your symptoms get worse.  You develop new symptoms. Get help right away if:  You develop symptoms of ESRD, which include: ? Headaches. ? Numbness in the hands or feet. ? Easy bruising. ? Frequent hiccups. ? Chest pain. ? Shortness of breath. ? Lack of menstruation, in women.  You have a fever.  You have decreased urine production.  You have pain or bleeding when you urinate. Summary  Chronic kidney disease (CKD) occurs when the kidneys become damaged slowly over a long period of time.  The most common causes of this condition are diabetes and high blood pressure (hypertension).  There is no cure for most cases of this condition, but treatment usually relieves symptoms and prevents or slows the progression of the disease. Treatment may include a  combination of medicines and lifestyle changes. This information is not intended to replace advice given to you by your health care provider. Make sure you discuss any questions you have with your health care provider. Document Released: 08/11/2008 Document Revised: 12/10/2016 Document Reviewed: 12/10/2016 Elsevier Interactive Patient Education  [employer].

## 2018-04-15 LAB — COMPREHENSIVE METABOLIC PANEL
ALT: 15 IU/L (ref 0–44)
AST: 15 IU/L (ref 0–40)
Albumin/Globulin Ratio: 2.2 (ref 1.2–2.2)
Albumin: 4.4 g/dL (ref 3.5–4.8)
Alkaline Phosphatase: 80 IU/L (ref 39–117)
BUN/Creatinine Ratio: 21 (ref 10–24)
BUN: 28 mg/dL — ABNORMAL HIGH (ref 8–27)
Bilirubin Total: <0.2 mg/dL (ref 0.0–1.2)
CO2: 15 mmol/L — ABNORMAL LOW (ref 20–29)
Calcium: 9.7 mg/dL (ref 8.6–10.2)
Chloride: 106 mmol/L (ref 96–106)
Creatinine, Ser: 1.36 mg/dL — ABNORMAL HIGH (ref 0.76–1.27)
GFR calc Af Amer: 57 mL/min/{1.73_m2} — ABNORMAL LOW (ref >59)
GFR calc non Af Amer: 49 mL/min/{1.73_m2} — ABNORMAL LOW (ref >59)
Globulin, Total: 2 g/dL (ref 1.5–4.5)
Glucose: 111 mg/dL — ABNORMAL HIGH (ref 65–99)
Potassium: 5.1 mmol/L (ref 3.5–5.2)
Sodium: 140 mmol/L (ref 134–144)
Total Protein: 6.4 g/dL (ref 6.0–8.5)

## 2018-04-15 LAB — SPECIMEN STATUS REPORT

## 2018-06-01 ENCOUNTER — Other Ambulatory Visit: Payer: Self-pay | Admitting: Family Medicine

## 2018-06-01 DIAGNOSIS — E1129 Type 2 diabetes mellitus with other diabetic kidney complication: Secondary | ICD-10-CM

## 2018-07-25 DIAGNOSIS — M1712 Unilateral primary osteoarthritis, left knee: Secondary | ICD-10-CM | POA: Diagnosis not present

## 2018-08-04 DIAGNOSIS — H52203 Unspecified astigmatism, bilateral: Secondary | ICD-10-CM | POA: Diagnosis not present

## 2018-08-04 DIAGNOSIS — Z961 Presence of intraocular lens: Secondary | ICD-10-CM | POA: Diagnosis not present

## 2018-08-04 DIAGNOSIS — E119 Type 2 diabetes mellitus without complications: Secondary | ICD-10-CM | POA: Diagnosis not present

## 2018-08-04 LAB — HM DIABETES EYE EXAM

## 2018-08-05 ENCOUNTER — Ambulatory Visit: Payer: Medicare Other

## 2018-08-12 ENCOUNTER — Ambulatory Visit (INDEPENDENT_AMBULATORY_CARE_PROVIDER_SITE_OTHER): Payer: Medicare Other

## 2018-08-12 VITALS — BP 117/65 | HR 66 | Temp 98.0°F

## 2018-08-12 DIAGNOSIS — Z23 Encounter for immunization: Secondary | ICD-10-CM

## 2018-08-18 ENCOUNTER — Other Ambulatory Visit: Payer: Medicare Other

## 2018-08-24 ENCOUNTER — Ambulatory Visit: Payer: Medicare Other | Admitting: Family Medicine

## 2018-09-19 ENCOUNTER — Other Ambulatory Visit (INDEPENDENT_AMBULATORY_CARE_PROVIDER_SITE_OTHER): Payer: Medicare Other

## 2018-09-19 DIAGNOSIS — N183 Chronic kidney disease, stage 3 unspecified: Secondary | ICD-10-CM

## 2018-09-19 DIAGNOSIS — I129 Hypertensive chronic kidney disease with stage 1 through stage 4 chronic kidney disease, or unspecified chronic kidney disease: Secondary | ICD-10-CM

## 2018-09-19 DIAGNOSIS — I10 Essential (primary) hypertension: Secondary | ICD-10-CM

## 2018-09-19 DIAGNOSIS — E1159 Type 2 diabetes mellitus with other circulatory complications: Secondary | ICD-10-CM | POA: Diagnosis not present

## 2018-09-19 DIAGNOSIS — E1122 Type 2 diabetes mellitus with diabetic chronic kidney disease: Secondary | ICD-10-CM | POA: Diagnosis not present

## 2018-09-19 DIAGNOSIS — E1129 Type 2 diabetes mellitus with other diabetic kidney complication: Secondary | ICD-10-CM | POA: Diagnosis not present

## 2018-09-19 DIAGNOSIS — M1712 Unilateral primary osteoarthritis, left knee: Secondary | ICD-10-CM | POA: Diagnosis not present

## 2018-09-19 DIAGNOSIS — M25561 Pain in right knee: Secondary | ICD-10-CM | POA: Diagnosis not present

## 2018-09-19 DIAGNOSIS — I152 Hypertension secondary to endocrine disorders: Secondary | ICD-10-CM

## 2018-09-20 LAB — COMPREHENSIVE METABOLIC PANEL
ALT: 15 IU/L (ref 0–44)
AST: 17 IU/L (ref 0–40)
Albumin/Globulin Ratio: 2 (ref 1.2–2.2)
Albumin: 4.3 g/dL (ref 3.5–4.8)
Alkaline Phosphatase: 110 IU/L (ref 39–117)
BUN/Creatinine Ratio: 16 (ref 10–24)
BUN: 24 mg/dL (ref 8–27)
Bilirubin Total: 0.3 mg/dL (ref 0.0–1.2)
CO2: 24 mmol/L (ref 20–29)
Calcium: 9.5 mg/dL (ref 8.6–10.2)
Chloride: 103 mmol/L (ref 96–106)
Creatinine, Ser: 1.49 mg/dL — ABNORMAL HIGH (ref 0.76–1.27)
GFR calc Af Amer: 51 mL/min/{1.73_m2} — ABNORMAL LOW (ref >59)
GFR calc non Af Amer: 44 mL/min/{1.73_m2} — ABNORMAL LOW (ref >59)
Globulin, Total: 2.1 g/dL (ref 1.5–4.5)
Glucose: 147 mg/dL — ABNORMAL HIGH (ref 65–99)
Potassium: 5.1 mmol/L (ref 3.5–5.2)
Sodium: 140 mmol/L (ref 134–144)
Total Protein: 6.4 g/dL (ref 6.0–8.5)

## 2018-09-22 ENCOUNTER — Ambulatory Visit: Payer: Medicare Other | Admitting: Family Medicine

## 2018-09-22 DIAGNOSIS — L570 Actinic keratosis: Secondary | ICD-10-CM | POA: Diagnosis not present

## 2018-09-22 DIAGNOSIS — D229 Melanocytic nevi, unspecified: Secondary | ICD-10-CM | POA: Diagnosis not present

## 2018-09-26 ENCOUNTER — Encounter: Payer: Self-pay | Admitting: Family Medicine

## 2018-09-26 ENCOUNTER — Ambulatory Visit (INDEPENDENT_AMBULATORY_CARE_PROVIDER_SITE_OTHER): Payer: Medicare Other | Admitting: Family Medicine

## 2018-09-26 VITALS — BP 111/71 | HR 83 | Temp 98.0°F | Ht 71.0 in | Wt 197.2 lb

## 2018-09-26 DIAGNOSIS — D649 Anemia, unspecified: Secondary | ICD-10-CM | POA: Diagnosis not present

## 2018-09-26 DIAGNOSIS — I129 Hypertensive chronic kidney disease with stage 1 through stage 4 chronic kidney disease, or unspecified chronic kidney disease: Secondary | ICD-10-CM

## 2018-09-26 DIAGNOSIS — N183 Chronic kidney disease, stage 3 unspecified: Secondary | ICD-10-CM

## 2018-09-26 DIAGNOSIS — I152 Hypertension secondary to endocrine disorders: Secondary | ICD-10-CM

## 2018-09-26 DIAGNOSIS — M1712 Unilateral primary osteoarthritis, left knee: Secondary | ICD-10-CM | POA: Diagnosis not present

## 2018-09-26 DIAGNOSIS — E1159 Type 2 diabetes mellitus with other circulatory complications: Secondary | ICD-10-CM | POA: Diagnosis not present

## 2018-09-26 DIAGNOSIS — Z79899 Other long term (current) drug therapy: Secondary | ICD-10-CM

## 2018-09-26 DIAGNOSIS — E1169 Type 2 diabetes mellitus with other specified complication: Secondary | ICD-10-CM | POA: Diagnosis not present

## 2018-09-26 DIAGNOSIS — I10 Essential (primary) hypertension: Secondary | ICD-10-CM

## 2018-09-26 DIAGNOSIS — E1122 Type 2 diabetes mellitus with diabetic chronic kidney disease: Secondary | ICD-10-CM | POA: Diagnosis not present

## 2018-09-26 DIAGNOSIS — E1129 Type 2 diabetes mellitus with other diabetic kidney complication: Secondary | ICD-10-CM | POA: Diagnosis not present

## 2018-09-26 DIAGNOSIS — Z87898 Personal history of other specified conditions: Secondary | ICD-10-CM | POA: Diagnosis not present

## 2018-09-26 DIAGNOSIS — E538 Deficiency of other specified B group vitamins: Secondary | ICD-10-CM | POA: Diagnosis not present

## 2018-09-26 DIAGNOSIS — E785 Hyperlipidemia, unspecified: Secondary | ICD-10-CM | POA: Diagnosis not present

## 2018-09-26 LAB — POCT GLYCOSYLATED HEMOGLOBIN (HGB A1C): Hemoglobin A1C: 6.4 % — AB (ref 4.0–5.6)

## 2018-09-26 MED ORDER — SCOPOLAMINE 1 MG/3DAYS TD PT72: 1.0000 | MEDICATED_PATCH | TRANSDERMAL | 0 refills | Status: DC

## 2018-09-26 NOTE — Patient Instructions (Addendum)
Nathan Gomez around 5 / 2020 you will be due again for full set of labs.  What you can do is come in for blood work 3 days prior to a chronic disease follow-up office visit with me, that way we will have those labs to discuss at follow-up.   Diabetes Mellitus and Standards of Medical Care  Managing diabetes (diabetes mellitus) can be complicated. Your diabetes treatment may be managed by a team of health care providers, including:  A diet and nutrition specialist (registered dietitian).  A nurse.  A certified diabetes educator (CDE).  A diabetes specialist (endocrinologist).  An eye doctor.  A primary care provider.  A dentist.  Your health care providers follow a schedule in order to help you get the best quality of care. The following schedule is a general guideline for your diabetes management plan. Your health care providers may also give you more specific instructions.  HbA1c (hemoglobin A1c) test This test provides information about blood sugar (glucose) control over the previous 2-3 months. It is used to check whether your diabetes management plan needs to be adjusted.  If you are meeting your treatment goals, this test is done at least 2 times a year.  If you are not meeting treatment goals or if your treatment goals have changed, this test is done 4 times a year.  Blood pressure test  This test is done at every routine medical visit. For most people, the goal is less than 130/80. Ask your health care provider what your goal blood pressure should be.  Dental and eye exams  Visit your dentist two times a year.  If you have type 1 diabetes, get an eye exam 3-5 years after you are diagnosed, and then once a year after your first exam. ? If you were diagnosed with type 1 diabetes as a child, get an eye exam when you are age 28 or older and have had diabetes for 3-5 years. After the first exam, you should get an eye exam once a year.  If you have type 2 diabetes, have an eye  exam as soon as you are diagnosed, and then once a year after your first exam.  Foot care exam  Visual foot exams are done at every routine medical visit. The exams check for cuts, bruises, redness, blisters, sores, or other problems with the feet.  A complete foot exam is done by your health care provider once a year. This exam includes an inspection of the structure and skin of your feet, and a check of the pulses and sensation in your feet. ? Type 1 diabetes: Get your first exam 3-5 years after diagnosis. ? Type 2 diabetes: Get your first exam as soon as you are diagnosed.  Check your feet every day for cuts, bruises, redness, blisters, or sores. If you have any of these or other problems that are not healing, contact your health care provider.  Kidney function test (urine microalbumin)  This test is done once a year. ? Type 1 diabetes: Get your first test 5 years after diagnosis. ? Type 2 diabetes: Get your first test as soon as you are diagnosed._  If you have chronic kidney disease (CKD), get a serum creatinine and estimated glomerular filtration rate (eGFR) test once a year.  Lipid profile (cholesterol, HDL, LDL, triglycerides)  This test should be done when you are diagnosed with diabetes, and every 5 years after the first test. If you are on medicines to lower your cholesterol, you  may need to get this test done every year. ? The goal for LDL is less than 100 mg/dL (5.5 mmol/L). If you are at high risk, the goal is less than 70 mg/dL (3.9 mmol/L). ? The goal for HDL is 40 mg/dL (2.2 mmol/L) for men and 50 mg/dL(2.8 mmol/L) for women. An HDL cholesterol of 60 mg/dL (3.3 mmol/L) or higher gives some protection against heart disease. ? The goal for triglycerides is less than 150 mg/dL (8.3 mmol/L).  Immunizations  The yearly flu (influenza) vaccine is recommended for everyone 6 months or older who has diabetes.  The pneumonia (pneumococcal) vaccine is recommended for everyone 2  years or older who has diabetes. If you are 7 or older, you may get the pneumonia vaccine as a series of two separate shots.  The hepatitis B vaccine is recommended for adults shortly after they have been diagnosed with diabetes.  The Tdap (tetanus, diphtheria, and pertussis) vaccine should be given: ? According to normal childhood vaccination schedules, for children. ? Every 10 years, for adults who have diabetes.  The shingles vaccine is recommended for people who have had chicken pox and are 50 years or older.  Mental and emotional health  Screening for symptoms of eating disorders, anxiety, and depression is recommended at the time of diagnosis and afterward as needed. If your screening shows that you have symptoms (you have a positive screening result), you may need further evaluation and be referred to a mental health care provider.  Diabetes self-management education  Education about how to manage your diabetes is recommended at diagnosis and ongoing as needed.  Treatment plan  Your treatment plan will be reviewed at every medical visit.  Summary  Managing diabetes (diabetes mellitus) can be complicated. Your diabetes treatment may be managed by a team of health care providers.  Your health care providers follow a schedule in order to help you get the best quality of care.  Standards of care including having regular physical exams, blood tests, blood pressure monitoring, immunizations, screening tests, and education about how to manage your diabetes.  Your health care providers may also give you more specific instructions based on your individual health.      Type 2 Diabetes Mellitus, Self Care, Adult Caring for yourself after you have been diagnosed with type 2 diabetes (type 2 diabetes mellitus) means keeping your blood sugar (glucose) under control with a balance of:  Nutrition.  Exercise.  Lifestyle changes.  Medicines or insulin, if necessary.  Support from  your team of health care providers and others.  The following information explains what you need to know to manage your diabetes at home. What do I need to do to manage my blood glucose?  Check your blood glucose every day, as often as told by your health care provider.  Contact your health care provider if your blood glucose is above your target for 2 tests in a row.  Have your A1c (hemoglobin A1c) level checked at least two times a year, or as often as told by your health care provider. Your health care provider will set individualized treatment goals for you. Generally, the goal of treatment is to maintain the following blood glucose levels:  Before meals (preprandial): 80-130 mg/dL (4.4-7.2 mmol/L).  After meals (postprandial): below 180 mg/dL (10 mmol/L).  A1c level: less than 7%.  What do I need to know about hyperglycemia and hypoglycemia? What is hyperglycemia? Hyperglycemia, also called high blood glucose, occurs when blood glucose is too high.Make  sure you know the early signs of hyperglycemia, such as:  Increased thirst.  Hunger.  Feeling very tired.  Needing to urinate more often than usual.  Blurry vision.  What is hypoglycemia? Hypoglycemia, also called low blood glucose, occurswith a blood glucose level at or below 70 mg/dL (3.9 mmol/L). The risk for hypoglycemia increases during or after exercise, during sleep, during illness, and when skipping meals or not eating for a long time (fasting). It is important to know the symptoms of hypoglycemia and treat it right away. Always have a 15-gram rapid-acting carbohydrate snack with you to treat low blood glucose. Family members and close friends should also know the symptoms and should understand how to treat hypoglycemia, in case you are not able to treat yourself. What are the symptoms of hypoglycemia? Hypoglycemia symptoms can include:  Hunger.  Anxiety.  Sweating and feeling clammy.  Confusion.  Dizziness  or feeling light-headed.  Sleepiness.  Nausea.  Increased heart rate.  Headache.  Blurry vision.  Seizure.  Nightmares.  Tingling or numbness around the mouth, lips, or tongue.  A change in speech.  Decreased ability to concentrate.  A change in coordination.  Restless sleep.  Tremors or shakes.  Fainting.  Irritability.  How do I treat hypoglycemia?  If you are alert and able to swallow safely, follow the 15:15 rule:  Take 15 grams of a rapid-acting carbohydrate. Rapid-acting options include: ? 1 tube of glucose gel. ? 3 glucose pills. ? 6-8 pieces of hard candy. ? 4 oz (120 mL) of fruit juice. ? 4 oz (120 mL) of regular (not diet) soda.  Check your blood glucose 15 minutes after you take the carbohydrate.  If the repeat blood glucose level is still at or below 70 mg/dL (3.9 mmol/L), take 15 grams of a carbohydrate again.  If your blood glucose level does not increase above 70 mg/dL (3.9 mmol/L) after 3 tries, seek emergency medical care.  After your blood glucose level returns to normal, eat a meal or a snack within 1 hour.  How do I treat severe hypoglycemia? Severe hypoglycemia is when your blood glucose level is at or below 54 mg/dL (3 mmol/L). Severe hypoglycemia is an emergency. Do not wait to see if the symptoms will go away. Get medical help right away. Call your local emergency services (911 in the [place] Do not drive yourself to the hospital. If you have severe hypoglycemia and you cannot eat or drink, you may need an injection of glucagon. A family member or close friend should learn how to check your blood glucose and how to give you a glucagon injection. Ask your health care provider if you need to have an emergency glucagon injection kit available. Severe hypoglycemia may need to be treated in a hospital. The treatment may include getting glucose through an IV tube. You may also need treatment for the cause of your hypoglycemia. Can having  diabetes put me at risk for other conditions? Having diabetes can put you at risk for other long-term (chronic) conditions, such as heart disease and kidney disease. Your health care provider may prescribe medicines to help prevent complications from diabetes. These medicines may include:  Aspirin.  Medicine to lower cholesterol.  Medicine to control blood pressure.  What else can I do to manage my diabetes? Take your diabetes medicines as told  If your health care provider prescribed insulin or diabetes medicines, take them every day.  Do not run out of insulin or other diabetes medicines that  you take. Plan ahead so you always have these available.  If you use insulin, adjust your dosage based on how physically active you are and what foods you eat. Your health care provider will tell you how to adjust your dosage. Make healthy food choices  The things that you eat and drink affect your blood glucose and your insulin dosage. Making good choices helps to control your diabetes and prevent other health problems. A healthy meal plan includes eating lean proteins, complex carbohydrates, fresh fruits and vegetables, low-fat dairy products, and healthy fats. Make an appointment to see a diet and nutrition specialist (registered dietitian) to help you create an eating plan that is right for you. Make sure that you:  Follow instructions from your health care provider about eating or drinking restrictions.  Drink enough fluid to keep your urine clear or pale yellow.  Eat healthy snacks between nutritious meals.  Track the carbohydrates that you eat. Do this by reading food labels and learning the standard serving sizes of foods.  Follow your sick day plan whenever you cannot eat or drink as usual. Make this plan in advance with your health care provider.  Stay active  Exercise regularly, as told by your health care provider. This may include:  Stretching and doing strength exercises, such  as yoga or weightlifting, at least 2 times a week.  Doing at least 150 minutes of moderate-intensity or vigorous-intensity exercise each week. This could be brisk walking, biking, or water aerobics. ? Spread out your activity over at least 3 days of the week. ? Do not go more than 2 days in a row without doing some kind of physical activity.  When you start a new exercise or activity, work with your health care provider to adjust your insulin, medicines, or food intake as needed. Make healthy lifestyle choices  Do not use any tobacco products, such as cigarettes, chewing tobacco, and e-cigarettes. If you need help quitting, ask your health care provider.  If your health care provider says that alcohol is safe for you, limit alcohol intake to no more than 1 drink per day for nonpregnant women and 2 drinks per day for men. One drink equals 12 oz of beer, 5 oz of wine, or 1 oz of hard liquor.  Learn to manage stress. If you need help with this, ask your health care provider. Care for your body   Keep your immunizations up to date. In addition to getting vaccinations as told by your health care provider, it is recommended that you get vaccinated against the following illnesses: ? The flu (influenza). Get a flu shot every year. ? Pneumonia. ? Hepatitis B.  Schedule an eye exam soon after your diagnosis, and then one time every year after that.  Check your skin and feet every day for cuts, bruises, redness, blisters, or sores. Schedule a foot exam with your health care provider once every year.  Brush your teeth and gums two times a day, and floss at least one time a day. Visit your dentist at least once every 6 months.  Maintain a healthy weight. General instructions  Take over-the-counter and prescription medicines only as told by your health care provider.  Share your diabetes management plan with people in your workplace, school, and household.  Check your urine for ketones when you  are ill and as told by your health care provider.  Ask your health care provider: ? Do I need to meet with a diabetes educator? ? Where  can I find a support group for people with diabetes?  Carry a medical alert card or wear medical alert jewelry.  Keep all follow-up visits as told by your health care provider. This is important. Where to find more information: For more information about diabetes, visit:  American Diabetes Association (ADA): www.diabetes.org  American Association of Diabetes Educators (AADE): www.diabeteseducator.org/patient-resources  This information is not intended to replace advice given to you by your health care provider. Make sure you discuss any questions you have with your health care provider. Document Released: 02/24/2016 Document Revised: 04/09/2016 Document Reviewed: 12/06/2015 Elsevier Interactive Patient Education  2017 [place].      Blood Glucose Monitoring, Adult Monitoring your blood sugar (glucose) helps you manage your diabetes. It also helps you and your health care provider determine how well your diabetes management plan is working. Blood glucose monitoring involves checking your blood glucose as often as directed, and keeping a record (log) of your results over time. Why should I monitor my blood glucose? Checking your blood glucose regularly can:  Help you understand how food, exercise, illnesses, and medicines affect your blood glucose.  Let you know what your blood glucose is at any time. You can quickly tell if you are having low blood glucose (hypoglycemia) or high blood glucose (hyperglycemia).  Help you and your health care provider adjust your medicines as needed.  When should I check my blood glucose? Follow instructions from your health care provider about how often to check your blood glucose.   This may depend on:  The type of diabetes you have.  How well-controlled your diabetes is.  Medicines you are taking.  If  you have type 1 diabetes:  Check your blood glucose at least 2 times a day.  Also check your blood glucose: ? Before every insulin injection. ? Before and after exercise. ? Between meals. ? 2 hours after a meal. ? Occasionally between 2:00 a.m. and 3:00 a.m., as directed. ? Before potentially dangerous tasks, like driving or using heavy machinery. ? At bedtime.  You may need to check your blood glucose more often, up to 6-10 times a day: ? If you use an insulin pump. ? If you need multiple daily injections (MDI). ? If your diabetes is not well-controlled. ? If you are ill. ? If you have a history of severe hypoglycemia. ? If you have a history of not knowing when your blood glucose is getting low (hypoglycemia unawareness).  If you have type 2 diabetes:  If you take insulin or other diabetes medicines, check your blood glucose at least 2 times a day.  If you are on intensive insulin therapy, check your blood glucose at least 4 times a day. Occasionally, you may also need to check between 2:00 a.m. and 3:00 a.m., as directed.  Also check your blood glucose: ? Before and after exercise. ? Before potentially dangerous tasks, like driving or using heavy machinery.  You may need to check your blood glucose more often if: ? Your medicine is being adjusted. ? Your diabetes is not well-controlled. ? You are ill.  What is a blood glucose log?  A blood glucose log is a record of your blood glucose readings. It helps you and your health care provider: ? Look for patterns in your blood glucose over time. ? Adjust your diabetes management plan as needed.  Every time you check your blood glucose, write down your result and notes about things that may be affecting your blood glucose,  such as your diet and exercise for the day.  Most glucose meters store a record of glucose readings in the meter. Some meters allow you to download your records to a computer. How do I check my blood  glucose? Follow these steps to get accurate readings of your blood glucose: Supplies needed   Blood glucose meter.  Test strips for your meter. Each meter has its own strips. You must use the strips that come with your meter.  A needle to prick your finger (lancet). Do not use lancets more than once.  A device that holds the lancet (lancing device).  A journal or log book to write down your results.  Procedure  Wash your hands with soap and water.  Prick the side of your finger (not the tip) with the lancet. Use a different finger each time.  Gently rub the finger until a small drop of blood appears.  Follow instructions that come with your meter for inserting the test strip, applying blood to the strip, and using your blood glucose meter.  Write down your result and any notes.  Alternative testing sites  Some meters allow you to use areas of your body other than your finger (alternative sites) to test your blood.  If you think you may have hypoglycemia, or if you have hypoglycemia unawareness, do not use alternative sites. Use your finger instead.  Alternative sites may not be as accurate as the fingers, because blood flow is slower in these areas. This means that the result you get may be delayed, and it may be different from the result that you would get from your finger.  The most common alternative sites are: ? Forearm. ? Thigh. ? Palm of the hand.  Additional tips  Always keep your supplies with you.  If you have questions or need help, all blood glucose meters have a 24-hour "hotline" number that you can call. You may also contact your health care provider.  After you use a few boxes of test strips, adjust (calibrate) your blood glucose meter by following instructions that came with your meter.    The American Diabetes Association suggests the following targets for most nonpregnant adults with diabetes.  More or less stringent glycemic goals may be appropriate  for each individual.  A1C: Less than 7% A1C may also be reported as eAG: Less than 154 mg/dl Before a meal (preprandial plasma glucose): 80-130 mg/dl 1-2 hours after beginning of the meal (Postprandial plasma glucose)*: Less than 180 mg/dl  *Postprandial glucose may be targeted if A1C goals are not met despite reaching preprandial glucose goals.   GOALS in short:  The goals are for the Hgb A1C to be less than 7.0 & blood pressure to be less than 130/80.    It is recommended that all diabetics are educated on and follow a healthy diabetic diet, exercise for 30 minutes 3-4 times per week (walking, biking, swimming, or machine), monitor blood glucose readings and bring that record with you to be reviewed at your next office visit.     You should be checking fasting blood sugars- especially after you eat poorly or eat really healthy, and also check 2 hour postprandial blood sugars after largest meal of the day.    Write these down and bring in your log at each office visit.    You will need to be seen every 3 months by the provider managing your Diabetes unless told otherwise by that provider.   You will need yearly  eye exams from an eye specialist and foot exams to check the nerves of your feet.  Also, your urine should be checked yearly as well to make sure excess protein is not present.   If you are checking your blood pressure at home, please record it and bring it to your next office visit.    Follow the Dietary Approaches to Stop Hypertension (DASH) diet (3 servings of fruit and vegetables daily, whole grains, low sodium, low-fat proteins).  See below.    Lastly, when it comes to your cholesterol, the goal is to have the HDL (good cholesterol) >40, and the LDL (bad cholesterol) <100.   It is recommended that you follow a heart healthy, low saturated and trans-fat diet and exercise for 30 minutes at least 5 times a week.     (( Check out the DASH diet = 1.5 Gram Low Sodium Diet   A 1.5  gram sodium diet restricts the amount of sodium in the diet to no more than 1.5 g or 1500 mg daily.  The American Heart Association recommends Americans over the age of 14 to consume no more than 1500 mg of sodium each day to reduce the risk of developing high blood pressure.  Research also shows that limiting sodium may reduce heart attack and stroke risk.  Many foods contain sodium for flavor and sometimes as a preservative.  When the amount of sodium in a diet needs to be low, it is important to know what to look for when choosing foods and drinks.  The following includes some information and guidelines to help make it easier for you to adapt to a low sodium diet.    QUICK TIPS  Do not add salt to food.  Avoid convenience items and fast food.  Choose unsalted snack foods.  Buy lower sodium products, often labeled as "lower sodium" or "no salt added."  Check food labels to learn how much sodium is in 1 serving.  When eating at a restaurant, ask that your food be prepared with less salt or none, if possible.    READING FOOD LABELS FOR SODIUM INFORMATION  The nutrition facts label is a good place to find how much sodium is in foods. Look for products with no more than 400 mg of sodium per serving.  Remember that 1.5 g = 1500 mg.  The food label may also list foods as:  Sodium-free: Less than 5 mg in a serving.  Very low sodium: 35 mg or less in a serving.  Low-sodium: 140 mg or less in a serving.  Light in sodium: 50% less sodium in a serving. For example, if a food that usually has 300 mg of sodium is changed to become light in sodium, it will have 150 mg of sodium.  Reduced sodium: 25% less sodium in a serving. For example, if a food that usually has 400 mg of sodium is changed to reduced sodium, it will have 300 mg of sodium.    CHOOSING FOODS  Grains  Avoid: Salted crackers and snack items. Some cereals, including instant hot cereals. Bread stuffing and biscuit mixes. Seasoned rice or  pasta mixes.  Choose: Unsalted snack items. Low-sodium cereals, oats, puffed wheat and rice, shredded wheat. English muffins and bread. Pasta.  Meats  Avoid: Salted, canned, smoked, spiced, pickled meats, including fish and poultry. Bacon, ham, sausage, cold cuts, hot dogs, anchovies.  Choose: Low-sodium canned tuna and salmon. Fresh or frozen meat, poultry, and fish.  Dairy  Avoid:  Processed cheese and spreads. Cottage cheese. Buttermilk and condensed milk. Regular cheese.  Choose: Milk. Low-sodium cottage cheese. Yogurt. Sour cream. Low-sodium cheese.  Fruits and Vegetables  Avoid: Regular canned vegetables. Regular canned tomato sauce and paste. Frozen vegetables in sauces. Olives. Angie Fava. Relishes. Sauerkraut.  Choose: Low-sodium canned vegetables. Low-sodium tomato sauce and paste. Frozen or fresh vegetables. Fresh and frozen fruit.  Condiments  Avoid: Canned and packaged gravies. Worcestershire sauce. Tartar sauce. Barbecue sauce. Soy sauce. Steak sauce. Ketchup. Onion, garlic, and table salt. Meat flavorings and tenderizers.  Choose: Fresh and dried herbs and spices. Low-sodium varieties of mustard and ketchup. Lemon juice. Tabasco sauce. Horseradish.    SAMPLE 1.5 GRAM SODIUM MEAL PLAN:   Breakfast / Sodium (mg)  1 cup low-fat milk / 143 mg  1 whole-wheat English muffin / 240 mg  1 tbs heart-healthy margarine / 153 mg  1 hard-boiled egg / 139 mg  1 small orange / 0 mg  Lunch / Sodium (mg)  1 cup raw carrots / 76 mg  2 tbs no salt added peanut butter / 5 mg  2 slices whole-wheat bread / 270 mg  1 tbs jelly / 6 mg   cup red grapes / 2 mg  Dinner / Sodium (mg)  1 cup whole-wheat pasta / 2 mg  1 cup low-sodium tomato sauce / 73 mg  3 oz lean ground beef / 57 mg  1 small side salad (1 cup raw spinach leaves,  cup cucumber,  cup yellow bell pepper) with 1 tsp olive oil and 1 tsp red wine vinegar / 25 mg  Snack / Sodium (mg)  1 container low-fat vanilla yogurt / 107 mg  3  graham cracker squares / 127 mg  Nutrient Analysis  Calories: 1745  Protein: 75 g  Carbohydrate: 237 g  Fat: 57 g  Sodium: 1425 mg  Document Released: 11/02/2005 Document Revised: 07/15/2011 Document Reviewed: 02/03/2010  ExitCare Patient Information 2012 [place].))    This information is not intended to replace advice given to you by your health care provider. Make sure you discuss any questions you have with your health care provider. Document Released: 11/05/2003 Document Revised: 05/22/2016 Document Reviewed: 04/13/2016 Elsevier Interactive Patient Education  2017 [employer].

## 2018-09-26 NOTE — Progress Notes (Signed)
Impression and Recommendations:    1. 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 Hypertension associated with diabetes ([place])   3. stage 3 chronic kidney disease due to type 2 diabetes mellitus ([place])   4. Hyperlipidemia associated with type 2 diabetes mellitus ([place])   5. B12 deficiency   6. Hypomagnesemia   7. High risk medication use   8. Anemia, unspecified type   9. History of motion sickness     - Last appointment, patient stopped Mobic and decreased B12.  He increased his magnesium supplement and decreased metformin at that time.  1. 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  Last A1c 5 months ago was 6.1.  - Last appointment, reduced metformin to help protect kidney health. - Stable at this time. - Continue treatment plan as prescribed.  See med list below. - Patient tolerating meds well without complication.  Denies S-E - Will continue to monitor.  - Counseled patient on pathophysiology of disease and discussed various treatment options, which often includes prudent dietary and lifestyle modifications as first line.  Importance of low carb/ketogenic diet discussed with patient in addition to regular exercise.   - Check FBS and 2 hours after the biggest meal of your day.  Keep log and bring in next OV for my review.   Also, if you ever feel poorly, please check your blood pressure and blood sugar, as one or the other could be the cause of your symptoms.  - Being a diabetic, you need yearly eye and foot exams. Make appt.for diabetic eye exam.  2. Stage 3 chronic kidney disease due to type 2 diabetes mellitus - Discussed maintenance of kidney health. - Reviewed importance of adequate exercise, hydration, and avoiding nephrotoxic substances. - Kidney function remains sub optimal, but similar  to prior. - Will continue to monitor.  3. B12 Deficiency - Patient due for lab re-check. - Continue supplementation as prescribed.  See med list today.  4. Hypomagnesemia - Adjustments made last visit.  See med list today. - Patient tolerating well.  No complaints.  - Due for lab work at this time.  Will continue to monitor.  5. Hypertension Associated with Diabetes - Stable at this time.  Will continue to monitor. - Continue treatment plan as prescribed.  See med list below. - Patient tolerating meds well without complication.  Denies S-E  - Lifestyle changes such as prudent diet and engaging in a regular exercise program discussed with patient.  Educational handouts provided  - Ambulatory BP monitoring encouraged. Keep log and bring in next OV  - Encouraged patient to monitor his blood pressure closely, especially if he is experiencing symptoms such as lightheadedness or dizziness.  6. Periodic Cramps in Thighs & Buttocks - Patient was advised that he may feel pain in his muscles about 24-48 hours after strenuous physical activity.  - Advised patient that if this pain occurs in the absence of strenuous activity or work, it would be more cause for concern.  Patient understands to keep an eye on this closely.  - Will continue to monitor.  7. Concerns about Seasickness - Patch Prescribed for Upcoming Cruise - Medication prescribed PRN. - Advised patient to beware of feeling spacey, dry-mouth, and reviewed other potential S-E to lok out for.  - Advised OTC bonine if desired for nausea, if  patch creates undesirable S-E.  8. BMI Counseling Explained to patient what BMI refers to, and what it means medically.    Told patient to think about it as a "medical risk stratification measurement" and how increasing BMI is associated with increasing risk/ or worsening state of various diseases such as hypertension, hyperlipidemia, diabetes, premature OA, depression etc.  American Heart  Association guidelines for healthy diet, basically Mediterranean diet, and exercise guidelines of 30 minutes 5 days per week or more discussed in detail.  Health counseling performed.  All questions answered.  9. Lifestyle & Preventative Health Maintenance - Advised patient to continue working toward exercising to improve overall mental, physical, and emotional health.    - Encouraged patient to engage in daily physical activity, especially a formal exercise routine.  Recommended that the patient eventually strive for at least 150 minutes of moderate cardiovascular activity per week according to guidelines established by the [facility].   - Healthy dietary habits encouraged, including low-carb, and high amounts of lean protein in diet.   - Patient should also consume adequate amounts of water.  10. Follow-Up - Prescriptions provided and refilled today PRN. - Re-check fasting lab work as recommended. - Otherwise, continue to return for CPE and chronic follow-up as scheduled.   - Patient knows to call in sooner if desired to address acute concerns.     Orders Placed This Encounter  Procedures  . Vitamin B12  . Folate  . Iron and TIBC  . Ferritin  . Comprehensive metabolic panel  . CBC with Differential/Platelet  . Vitamin B12  . Magnesium  . Phosphorus  . T4, free  . TSH  . Lipid panel  . Hemoglobin A1c  . POCT glycosylated hemoglobin (Hb A1C)  . HM Diabetes Foot Exam    Meds ordered this encounter  Medications  . scopolamine (TRANSDERM-SCOP, 1.5 MG,) 1 MG/3DAYS    Sig: Place 1 patch (1.5 mg total) onto the skin every 3 (three) days.    Dispense:  4 patch    Refill:  0    Medications Discontinued During This Encounter  Medication Reason  . hydrochlorothiazide (HYDRODIURIL) 25 MG tablet   . polyethylene glycol powder (GLYCOLAX/MIRALAX) powder Patient Preference     Gross side effects, risk and benefits, and alternatives of medications and treatment plan in general  discussed with patient.  Patient is aware that all medications have potential side effects and we are unable to predict every side effect or drug-drug interaction that may occur.   Patient will call with any questions prior to using medication if they have concerns.    Expresses verbal understanding and consents to current therapy and treatment regimen.  No barriers to understanding were identified.  Red flag symptoms and signs discussed in detail.  Patient expressed understanding regarding what to do in case of emergency\urgent symptoms  Please see AVS handed out to patient at the end of our visit for further patient instructions/ counseling done pertaining to today's office visit.   Return for Six-month follow-up or sooner for diabetes, [place] BP, HLD, etc..     Note:  This note was prepared with assistance of Dragon voice recognition software. Occasional wrong-word or sound-a-like substitutions may have occurred due to the inherent limitations of voice recognition software.   This document serves as a record of services personally performed by Mellody Dance, DO. It was created on her behalf by Toni Amend, a trained medical scribe. The creation of this record is based on the scribe's personal  observations and the provider's statements to them.   I have reviewed the above medical documentation for accuracy and completeness and I concur.  Mellody Dance, DO, D.O. 09/26/2018 3:15 PM        -------------------------------------------------------------------------------------------------------------------     Subjective:     HPI: Nathan Gomez is a 77 y.o. male who presents to [place] [facility] today for issues as discussed below.  Last appointment, patient stopped Mobic and decreased B12.  He increased his magnesium supplement and decreased metformin at that time.   Patient is feeling good overall.  He's getting ready to go to the [place] with  his grandchildren, and just came back from [place] "with his other grandchildren."  Blood Pressure Patient does not check his blood pressure at home, but states it's always good when he goes to the dentist or other doctor's offices.  Denies dizziness or lightheadedness.  States "I've been surprised by how low it is lately."  States he knows that he's not drinking enough water; "that's one of my sins."  Periodic Cramps in Thighs and Buttocks States he has days he gets "what feels like lactic acid burn in my thighs and buttocks while I'm walking."    DM HPI:  -  He has been working on diet and exercise for diabetes.  Patient exercises on bicycle and performs stretches for his knees and hips.  Sates he does several activities for the health of his back and "trying to walk more often."  Pt is currently maintained on the following medications for diabetes: see med list today  Medication compliance - continues taking medications as prescribed.  Home glucose readings range: Running 119 in the morning, some around 125.   Denies polyuria/polydipsia.  Denies hypo/ hyperglycemia symptoms.  Last diabetic eye exam was  Lab Results  Component Value Date   HMDIABEYEEXA No Retinopathy 08/04/2018    Foot exam- UTD  Last A1C in the office was:  Lab Results  Component Value Date   HGBA1C 6.4 (A) 09/26/2018   HGBA1C 6.1 (H) 04/08/2018   HGBA1C 6.6 12/14/2017    Lab Results  Component Value Date   MICROALBUR 10 04/08/2017   LDLCALC 78 04/08/2018   CREATININE 1.49 (H) 09/19/2018    Wt Readings from Last 3 Encounters:  09/26/18 197 lb 3.2 oz (89.4 kg)  04/13/18 189 lb 12.8 oz (86.1 kg)  02/08/18 195 lb (88.5 kg)    BP Readings from Last 3 Encounters:  09/26/18 111/71  08/12/18 117/65  04/13/18 100/66        Wt Readings from Last 3 Encounters:  09/26/18 197 lb 3.2 oz (89.4 kg)  04/13/18 189 lb 12.8 oz (86.1 kg)  02/08/18 195 lb (88.5 kg)   BP Readings from Last 3  Encounters:  09/26/18 111/71  08/12/18 117/65  04/13/18 100/66   Pulse Readings from Last 3 Encounters:  09/26/18 83  08/12/18 66  04/13/18 77   BMI Readings from Last 3 Encounters:  09/26/18 27.50 kg/m  04/13/18 25.74 kg/m  02/08/18 26.45 kg/m     Patient Care Team    Relationship Specialty Notifications Start End  Mellody Dance, DO PCP - General Family Medicine  04/08/17   Gatha Mayer, MD Consulting Physician Gastroenterology  09/28/11   Darleen Crocker, MD  Ophthalmology  09/16/12   Melrose Nakayama, MD  Orthopedic Surgery  12/05/13   Kathie Rhodes, MD  Urology  07/30/15   Katy Apo, MD Consulting Physician Ophthalmology  04/08/17  Arlyss Gandy, [place]  Dermatology  09/13/17      Patient Active Problem List   Diagnosis Date Noted  . stage 3 chronic kidney disease due to type 2 diabetes mellitus ([place]) 02/22/2017    Priority: High  . Diabetes mellitus type 2, controlled ([place]) 01/05/2011    Priority: High  . Hyperlipidemia associated with type 2 diabetes mellitus ([place]) 01/05/2011    Priority: High  . Hypertension associated with diabetes ([place]) 01/05/2011    Priority: High  . GERD (gastroesophageal reflux disease) 09/12/2017    Priority: Medium  . Urinary retention 07/30/2015    Priority: Medium  . Personal history of colonic adenomas 01/16/2013    Priority: Medium  . Hypomagnesemia 04/08/2016    Priority: Low  . B12 deficiency 01/30/2016    Priority: Low  . Pernicious anemia 02/22/2012    Priority: Low  . Anemia     Priority: Low  . Osteoarthritis 01/05/2011    Priority: Low  . High risk medication use 09/26/2018  . Vitamin D deficiency 09/12/2017  . Ruptured, tendon, Achilles, right, sequela 04/08/2017  . Elevated PSA 08/25/2016  . Primary osteoarthritis of right knee 07/09/2015    Past Medical history, Surgical history, Family history, Social history, Allergies and Medications have been entered into the medical record, reviewed and  changed as needed.    Current Meds  Medication Sig  . amLODipine (NORVASC) 10 MG tablet Take 1 tablet (10 mg total) by mouth daily.  Marland Kitchen atorvastatin (LIPITOR) 20 MG tablet Take 1 tablet (20 mg total) by mouth at bedtime.  . blood glucose meter kit and supplies KIT Use to test blood sugar up to twice a day. DX E11.09  . Cholecalciferol (VITAMIN D) 2000 units CAPS Take 1 capsule (2,000 Units total) by mouth every morning.  . Cyanocobalamin (VITAMIN B12 PO) Take 1 tablet by mouth daily.  Marland Kitchen GLUCOSAMINE SULFATE PO Take 1 tablet by mouth daily.  Marland Kitchen glucose blood test strip Use as instructed to test blood sugar up to twice a day. DX:  E11.09  . Krill Oil 300 MG CAPS Take 500 mg by mouth daily.   . Lancets 17H MISC Delica Fine Lancets. Use to test blood sugar up to twice a day. DX E11.09  . lisinopril (PRINIVIL,ZESTRIL) 20 MG tablet Take 1 tablet (20 mg total) by mouth daily.  . magnesium oxide (MAG-OX) 400 MG tablet Take 1 tablet (400 mg total) by mouth 3 (three) times daily.  . metFORMIN (GLUCOPHAGE-XR) 500 MG 24 hr tablet 500 MG q 12 hrs daily  . Multiple Vitamins-Minerals (SENIOR MULTIVITAMIN PLUS) TABS Take 1 tablet by mouth daily.    Marland Kitchen omeprazole (PRILOSEC) 20 MG capsule Take 1 capsule (20 mg total) by mouth at bedtime. (Patient taking differently: Take 20 mg by mouth every other day. )  . ranitidine (ZANTAC) 150 MG tablet Take 150 mg by mouth 2 (two) times daily.  . tamsulosin (FLOMAX) 0.4 MG CAPS capsule Take 1 capsule (0.4 mg total) by mouth at bedtime. (Patient taking differently: Take 0.4-0.8 mg by mouth See admin instructions. Take 0.4 mg by mouth daily. Starting 7 days before procedure take 0.53m by mouth daily)  . Triamcinolone Acetonide (NASACORT AQ NA) Place 1 spray into the nose daily as needed (allergies).   Current Facility-Administered Medications for the 09/26/18 encounter (Office Visit) with OMellody Dance DO  Medication  . 0.9 %  sodium chloride infusion    Allergies:    Allergies  Allergen Reactions  . Nsaids  Kidney Disease  . Sulfa Antibiotics Other (See Comments)    As child    Not sure rxn  . Thiazide-Type Diuretics Other (See Comments)    Unknown     Review of Systems:  A fourteen system review of systems was performed and found to be positive as per HPI.   Objective:   Blood pressure 111/71, pulse 83, temperature 98 F (36.7 C), height 5' 11" (1.803 m), weight 197 lb 3.2 oz (89.4 kg), SpO2 97 %. Body mass index is 27.5 kg/m. General:  Well Developed, well nourished, appropriate for stated age.  Neuro:  Alert and oriented,  extra-ocular muscles intact  HEENT:  Normocephalic, atraumatic, neck supple, no carotid bruits appreciated  Skin:  no gross rash, warm, pink. Cardiac:  RRR, S1 S2 Respiratory:  ECTA B/L and A/P, Not using accessory muscles, speaking in full sentences- unlabored. Vascular:  Ext warm, no cyanosis apprec.; cap RF less 2 sec. Psych:  No HI/SI, judgement and insight good, Euthymic mood. Full Affect.

## 2018-09-28 ENCOUNTER — Other Ambulatory Visit: Payer: Self-pay

## 2018-09-28 MED ORDER — SCOPOLAMINE 1 MG/3DAYS TD PT72: 1.0000 | MEDICATED_PATCH | TRANSDERMAL | 0 refills | Status: DC

## 2018-10-03 DIAGNOSIS — M1712 Unilateral primary osteoarthritis, left knee: Secondary | ICD-10-CM | POA: Diagnosis not present

## 2018-11-19 ENCOUNTER — Ambulatory Visit (HOSPITAL_COMMUNITY)
Admission: EM | Admit: 2018-11-19 | Discharge: 2018-11-19 | Discharge status: Against Medical Advice | Payer: Medicare Other | Source: Ambulatory Visit

## 2018-11-19 ENCOUNTER — Encounter: Payer: Self-pay | Admitting: Emergency Medicine

## 2018-11-19 ENCOUNTER — Other Ambulatory Visit: Payer: Self-pay

## 2018-11-19 ENCOUNTER — Ambulatory Visit
Admission: EM | Admit: 2018-11-19 | Discharge: 2018-11-19 | Disposition: A | Discharge status: Home / Self Care | Payer: Medicare Other | Attending: Family Medicine | Admitting: Family Medicine

## 2018-11-19 DIAGNOSIS — N390 Urinary tract infection, site not specified: Secondary | ICD-10-CM | POA: Insufficient documentation

## 2018-11-19 DIAGNOSIS — R319 Hematuria, unspecified: Secondary | ICD-10-CM | POA: Diagnosis not present

## 2018-11-19 LAB — POCT URINALYSIS DIP (MANUAL ENTRY)
Glucose, UA: NEGATIVE mg/dL
Ketones, POC UA: NEGATIVE mg/dL
Nitrite, UA: POSITIVE — AB
Protein Ur, POC: >=300 mg/dL — AB
Spec Grav, UA: 1.025 (ref 1.010–1.025)
Urobilinogen, UA: 0.2 E.U./dL
pH, UA: 6 (ref 5.0–8.0)

## 2018-11-19 MED ORDER — CEFACLOR 500 MG PO CAPS: 500.0000 mg | ORAL_CAPSULE | Freq: Two times a day (BID) | ORAL | 0 refills | Status: DC

## 2018-11-19 NOTE — ED Provider Notes (Signed)
[place]    CSN: [id number] Arrival date & time: 11/19/18  1155     History   Chief Complaint Chief Complaint  Patient presents with  . Hematuria    HPI Nathan Gomez is a 78 y.o. male.   HPI  Pleasant 78 year old gentleman who is here for hematuria.  He had some difficulty starting and stopping his stream of urine yesterday.  No abdominal pain or pelvic pain.  No flank pain or kidney pain.  No fever or chills.  Today he had some difficulty urinating and passed a blood clot.  After this his urine cleared up.  He is here for evaluation.  He has known prostatic hypertrophy and is on Flomax.  He has a history of urinary retention with some medications and procedures.  He has never had a urinary tract infection.  Never had a kidney or bladder stone.  No recent travel.  No recent change in diet or fluid intake.  Past Medical History:  Diagnosis Date  . Achilles tendinitis   . Allergy   . Cataract   . Chronic kidney disease   . Complication of anesthesia    urinary retention  . 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 Diabetes mellitus, type 2 ([place])   . Dyslipidemia   . GERD (gastroesophageal reflux disease)   . Heart murmur    child  . Hyperlipidemia   . Hypertension   . Osteoarthritis    right knee  . Personal history of colonic adenomas 01/16/2013  . Plantar fasciitis     Patient Active Problem List   Diagnosis Date Noted  . High risk medication use 09/26/2018  . GERD (gastroesophageal reflux disease) 09/12/2017  . Vitamin D deficiency 09/12/2017  . Ruptured, tendon, Achilles, right, sequela 04/08/2017  . stage 3 chronic kidney disease due to type 2 diabetes mellitus ([place]) 02/22/2017  . Elevated PSA 08/25/2016  . Hypomagnesemia 04/08/2016  . B12 deficiency 01/30/2016  . Urinary retention 07/30/2015  . Primary osteoarthritis of right knee 07/09/2015  . Personal history of colonic adenomas 01/16/2013  . Pernicious anemia 02/22/2012  .  Anemia   . Diabetes mellitus type 2, controlled ([place]) 01/05/2011  . Hyperlipidemia associated with type 2 diabetes mellitus ([place]) 01/05/2011  . Hypertension associated with diabetes ([place]) 01/05/2011  . Osteoarthritis 01/05/2011    Past Surgical History:  Procedure Laterality Date  . CATARACT EXTRACTION  08/2012 L, 09/2012 R  . COLONOSCOPY    . TONSILLECTOMY  1945  . TONSILLECTOMY    . TOTAL HIP ARTHROPLASTY  2011   Left  . TOTAL HIP ARTHROPLASTY Right 07/09/2015   Procedure: TOTAL HIP ARTHROPLASTY ANTERIOR APPROACH;  Surgeon: Melrose Nakayama, MD;  Location: [facility];  Service: Orthopedics;  Laterality: Right;  . TOTAL KNEE ARTHROPLASTY Right 07/21/2016   Procedure: RIGHT TOTAL KNEE ARTHROPLASTY;  Surgeon: Melrose Nakayama, MD;  Location: [facility];  Service: Orthopedics;  Laterality: Right;       Home Medications    Prior to Admission medications   Medication Sig Start Date End Date Taking? Authorizing Provider  amLODipine (NORVASC) 10 MG tablet Take 1 tablet (10 mg total) by mouth daily. 12/03/17   Opalski, Neoma Laming, DO  atorvastatin (LIPITOR) 20 MG tablet Take 1 tablet (20 mg total) by mouth at bedtime. 02/24/18   Opalski, Deborah, DO  blood glucose meter kit and supplies KIT Use to test blood sugar up to twice a day. DX E11.09 07/30/15   Rowe Clack, MD  cefaclor (CECLOR) 500 MG capsule Take 1 capsule (500 mg total) by mouth 2 (two) times daily. 11/19/18   Raylene Everts, MD  Cholecalciferol (VITAMIN D) 2000 units CAPS Take 1 capsule (2,000 Units total) by mouth every morning. 12/03/17   Opalski, Neoma Laming, DO  Cyanocobalamin (VITAMIN B12 PO) Take 1 tablet by mouth daily.    [provider]  GLUCOSAMINE SULFATE PO Take 1 tablet by mouth daily.    [provider]  glucose blood test strip Use as instructed to test blood sugar up to twice a day. DX:  E11.09 07/30/15   Rowe Clack, MD  Krill Oil 300 MG CAPS Take 500 mg by mouth daily.     [provider]    Lancets 42V MISC Delica Fine Lancets. Use to test blood sugar up to twice a day. DX E11.09 07/30/15   Rowe Clack, MD  lisinopril (PRINIVIL,ZESTRIL) 20 MG tablet Take 1 tablet (20 mg total) by mouth daily. 12/03/17   Opalski, Neoma Laming, DO  loratadine (CLARITIN) 10 MG tablet Take 1 tablet (10 mg total) by mouth daily. Patient taking differently: Take 10 mg by mouth daily as needed for allergies.  03/18/15 05/13/17  Rowe Clack, MD  magnesium oxide (MAG-OX) 400 MG tablet Take 1 tablet (400 mg total) by mouth 3 (three) times daily. 04/13/18   Mellody Dance, DO  metFORMIN (GLUCOPHAGE-XR) 500 MG 24 hr tablet 500 MG q 12 hrs daily 04/13/18   Opalski, Neoma Laming, DO  Multiple Vitamins-Minerals (SENIOR MULTIVITAMIN PLUS) TABS Take 1 tablet by mouth daily.      [provider]  omeprazole (PRILOSEC) 20 MG capsule Take 1 capsule (20 mg total) by mouth at bedtime. Patient taking differently: Take 20 mg by mouth every other day.  03/23/16   Binnie Rail, MD  ranitidine (ZANTAC) 150 MG tablet Take 150 mg by mouth 2 (two) times daily.    [provider]  scopolamine (TRANSDERM-SCOP, 1.5 MG,) 1 MG/3DAYS Place 1 patch (1.5 mg total) onto the skin every 3 (three) days. 09/28/18   Mellody Dance, DO  tamsulosin (FLOMAX) 0.4 MG CAPS capsule Take 1 capsule (0.4 mg total) by mouth at bedtime. Patient taking differently: Take 0.4-0.8 mg by mouth See admin instructions. Take 0.4 mg by mouth daily. Starting 7 days before procedure take 0.53m by mouth daily 03/27/16   BBinnie Rail MD  Triamcinolone Acetonide (NASACORT AQ NA) Place 1 spray into the nose daily as needed (allergies).    [provider]    Family History Family History  Problem Relation Age of Onset  . Ovarian cancer Mother   . Hypertension Father   . Diabetes Maternal Grandfather   . Pneumonia Paternal Grandfather   . Colon cancer Neg Hx   . Esophageal cancer Neg Hx   . Liver cancer Neg Hx   . Pancreatic cancer  Neg Hx   . Rectal cancer Neg Hx   . Stomach cancer Neg Hx     Social History Social History   Tobacco Use  . Smoking status: Former Smoker    Packs/day: 1.00    Years: 20.00    Pack years: 20.00    Last attempt to quit: 11/16/1986    Years since quitting: 32.0  . Smokeless tobacco: Never Used  Substance Use Topics  . Alcohol use: Yes    Alcohol/week: 1.0 - 2.0 standard drinks    Types: 1 - 2 Glasses of wine per week  . Drug use: No  Allergies   Nsaids; Sulfa antibiotics; and Thiazide-type diuretics   Review of Systems Review of Systems  Constitutional: Negative for chills and fever.  HENT: Negative for ear pain and sore throat.   Eyes: Negative for pain and visual disturbance.  Respiratory: Negative for cough and shortness of breath.   Cardiovascular: Negative for chest pain and palpitations.  Gastrointestinal: Negative for abdominal pain and vomiting.  Genitourinary: Positive for difficulty urinating and hematuria. Negative for dysuria.  Musculoskeletal: Negative for arthralgias and back pain.  Skin: Negative for color change and rash.  Neurological: Negative for seizures and syncope.  All other systems reviewed and are negative.    Physical Exam Triage Vital Signs ED Triage Vitals  Enc Vitals Group     BP 11/19/18 1228 (!) 160/88     Pulse Rate 11/19/18 1228 68     Resp --      Temp 11/19/18 1228 97.6 F (36.4 C)     Temp Source 11/19/18 1228 Oral     SpO2 11/19/18 1228 95 %     Weight 11/19/18 1229 198 lb (89.8 kg)     Height 11/19/18 1229 6' (1.829 m)   No data found.  Updated Vital Signs BP (!) 160/88 (BP Location: Right Arm)   Pulse 68   Temp 97.6 F (36.4 C) (Oral)   Ht 6' (1.829 m)   Wt 89.8 kg   SpO2 95%   BMI 26.85 kg/m       Physical Exam Constitutional:      General: He is not in acute distress.    Appearance: He is well-developed and normal weight.  HENT:     Head: Normocephalic and atraumatic.  Eyes:      Conjunctiva/sclera: Conjunctivae normal.     Pupils: Pupils are equal, round, and reactive to light.  Neck:     Musculoskeletal: Normal range of motion.  Cardiovascular:     Rate and Rhythm: Normal rate and regular rhythm.     Heart sounds: Normal heart sounds.  Pulmonary:     Effort: Pulmonary effort is normal. No respiratory distress.     Breath sounds: Normal breath sounds.  Abdominal:     General: Abdomen is flat. There is no distension.     Palpations: Abdomen is soft.     Tenderness: There is no abdominal tenderness.     Comments: No CVA tenderness.  No abdominal tenderness  Musculoskeletal: Normal range of motion.  Skin:    General: Skin is warm and dry.  Neurological:     General: No focal deficit present.     Mental Status: He is alert. Mental status is at baseline.  Psychiatric:        Mood and Affect: Mood normal.        Thought Content: Thought content normal.      UC Treatments / Results  Labs (all labs ordered are listed, but only abnormal results are displayed) Labs Reviewed  POCT URINALYSIS DIP (MANUAL ENTRY) - Abnormal; Notable for the following components:      Result Value   Color, UA red (*)    Bilirubin, UA small (*)    Blood, UA large (*)    Protein Ur, POC >=300 (*)    Nitrite, UA Positive (*)    Leukocytes, UA Large (3+) (*)    All other components within normal limits  URINE CULTURE    EKG None  Radiology No results found.  Procedures Procedures (including critical care time)  Medications Ordered  in UC Medications - No data to display  Initial Impression / Assessment and Plan / UC Course  I have reviewed the triage vital signs and the nursing notes.  Pertinent labs & imaging results that were available during my care of the patient were reviewed by me and considered in my medical decision making (see chart for details).    Reviewed with patient that he has a urinary tract infection.  Doubt that is in his kidneys with no kidney  pain, fever, or nausea vomiting.  Possibly prostatic.  Possibly bladder.  I am choosing an antibiotic that would cover both.  He needs to push fluids.  He needs to see his physician after the antibiotics for repeat urinalysis to confirm that his infection is gone Final Clinical Impressions(s) / UC Diagnoses   Final diagnoses:  Hematuria, unspecified type  Lower urinary tract infection     Discharge Instructions     Make sure you are drinking plenty of fluids The antibiotic twice a day until gone See your primary care doctor a few days after the antibiotics are completed in order to get a repeat urine sample.  I would like to make sure that the blood in your urine has completely resolved Return here or to your doctor if you get worse instead of better at any time   ED Prescriptions    Medication Sig Dispense Auth. Provider   cefaclor (CECLOR) 500 MG capsule Take 1 capsule (500 mg total) by mouth 2 (two) times daily. 20 capsule Raylene Everts, MD     Controlled Substance Prescriptions [place] Controlled Substance Registry consulted? Not Applicable   Raylene Everts, MD 11/19/18 [phone]

## 2018-11-19 NOTE — Discharge Instructions (Signed)
Make sure you are drinking plenty of fluids The antibiotic twice a day until gone See your primary care doctor a few days after the antibiotics are completed in order to get a repeat urine sample.  I would like to make sure that the blood in your urine has completely resolved Return here or to your doctor if you get worse instead of better at any time

## 2018-11-19 NOTE — ED Triage Notes (Signed)
Per pt he was having difficulty urinating yesterday and last night. Very difficult this morning and very painful when urinated and noticed a blood clot. No fevers no chills no pain now.

## 2018-11-22 LAB — URINE CULTURE: Culture: <10000 — AB

## 2018-11-23 ENCOUNTER — Telehealth: Payer: Self-pay | Admitting: Family Medicine

## 2018-11-23 NOTE — Telephone Encounter (Signed)
Noted and updated in chart. MPulliam, CMA/RT(R)

## 2018-11-23 NOTE — Telephone Encounter (Signed)
Patient states has changed his & wife Prescription Drug plan from [place] to [facility]) :  Preferred Local pharmacy is :   [place], [place] [phone] (Phone) [phone] (Fax)   Mail Order for 90dys/ 3 month supply go to :  [facility] [place] [facility] [employer] [phone] (Phone) [phone] (Fax)   --forwarding update to medical assistant.  --Dion Body

## 2018-12-01 DIAGNOSIS — R972 Elevated prostate specific antigen [PSA]: Secondary | ICD-10-CM | POA: Diagnosis not present

## 2018-12-01 DIAGNOSIS — R310 Gross hematuria: Secondary | ICD-10-CM | POA: Diagnosis not present

## 2018-12-01 DIAGNOSIS — N400 Enlarged prostate without lower urinary tract symptoms: Secondary | ICD-10-CM | POA: Diagnosis not present

## 2018-12-13 DIAGNOSIS — R310 Gross hematuria: Secondary | ICD-10-CM | POA: Diagnosis not present

## 2018-12-15 DIAGNOSIS — R310 Gross hematuria: Secondary | ICD-10-CM | POA: Diagnosis not present

## 2018-12-26 ENCOUNTER — Telehealth: Payer: Self-pay | Admitting: Family Medicine

## 2018-12-26 ENCOUNTER — Other Ambulatory Visit: Payer: Self-pay

## 2018-12-26 DIAGNOSIS — I152 Hypertension secondary to endocrine disorders: Secondary | ICD-10-CM

## 2018-12-26 DIAGNOSIS — E785 Hyperlipidemia, unspecified: Principal | ICD-10-CM

## 2018-12-26 DIAGNOSIS — E1159 Type 2 diabetes mellitus with other circulatory complications: Secondary | ICD-10-CM

## 2018-12-26 DIAGNOSIS — I10 Essential (primary) hypertension: Secondary | ICD-10-CM

## 2018-12-26 DIAGNOSIS — E1169 Type 2 diabetes mellitus with other specified complication: Secondary | ICD-10-CM

## 2018-12-26 MED ORDER — LISINOPRIL 20 MG PO TABS: 20.0000 mg | ORAL_TABLET | Freq: Every day | ORAL | 1 refills | Status: DC

## 2018-12-26 MED ORDER — ATORVASTATIN CALCIUM 20 MG PO TABS: 20.0000 mg | ORAL_TABLET | Freq: Every day | ORAL | 1 refills | Status: DC

## 2018-12-26 NOTE — Telephone Encounter (Signed)
Patient requesting refill on lipitor and lisinopril.  Reviewed chart and sent in refill per office policy. MPulliam, CMA/RT(R)

## 2018-12-26 NOTE — Telephone Encounter (Signed)
Patient called states his prescription carrier has changed from [facility]) to ([employer]) & he needs refills on these (2) Rxs:   lisinopril (PRINIVIL,ZESTRIL) 20 MG tablet [[id number]]   Order Details  Dose: 20 mg Route: Oral Frequency: Daily  Note to Pharmacy:  Member ID [id number]      Indications of Use: Hypertension  Dispense Quantity: 90 tablet Refills: 1 Fills remaining: --        Sig: Take 1 tablet (20 mg total) by mouth daily.     &    atorvastatin (LIPITOR) 20 MG tablet [[id number]]   Order Details  Dose: 20 mg Route: Oral Frequency: Daily at bedtime  Indications of Use: Hyperlipidemia, Mixed Dyslipidemia  Dispense Quantity: 90 tablet Refills: 1 Fills remaining: --        Sig: Take 1 tablet (20 mg total) by mout     ---Forwarding request to medical assistant to send refill order to:  Preferred Pharmacies      [facility] [place] [facility] [employer] [phone] (Phone) [phone] (Fax)   --Dion Body

## 2018-12-26 NOTE — Telephone Encounter (Signed)
Sent in refills.  Patient notified. MPulliam, CMA/RT(R)

## 2019-02-03 ENCOUNTER — Telehealth: Payer: Self-pay | Admitting: Family Medicine

## 2019-02-03 NOTE — Telephone Encounter (Signed)
Patient called states he & wife are stranded @ [facility] for at least a month & he is almost out of these (4 ) prescriptions :   1)--- amLODipine (NORVASC) 10 MG tablet [[id number]]   Order Details  Dose: 10 mg Route: Oral Frequency: Daily  Indications of Use: Hypertension  Dispense Quantity: 90 tablet Refills: 1 Fills remaining: --        Sig: Take 1 tablet (10 mg total) by mouth      2)--- atorvastatin (LIPITOR) 20 MG tablet [[id number]]   Order Details  Dose: 20 mg Route: Oral Frequency: Daily at bedtime  Indications of Use: Hyperlipidemia, Mixed Dyslipidemia  Dispense Quantity: 90 tablet Refills: 1 Fills remaining: --        Sig: Take 1 tablet (20 mg total) by mouth at bedtime.     3)--- metFORMIN (GLUCOPHAGE-XR) 500 MG 24 hr tablet [[id number]]   Order Details  Dose, Route, Frequency: As Directed   Indications of Use: Type 2 Diabetes Mellitus  Dispense Quantity: 180 tablet Refills: 1 Fills remaining: --        Sig: 500 MG q 12 hrs daily           4)--- lisinopril (PRINIVIL,ZESTRIL) 20 MG tablet [[id number]]   Order Details  Dose: 20 mg Route: Oral Frequency: Daily  Note to Pharmacy:  Member ID [id number]      Indications of Use: Hypertension  Dispense Quantity: 90 tablet Refills: 1 Fills remaining: --        Sig: Take 1 tablet (20 mg total) by mouth daily.          --- Forwarding message to medical assistant to send refill orders to :   [facility] [address] [place], [place] [address]   Contact number [phone] Store [phone] Fax# [phone]  --glh

## 2019-02-06 ENCOUNTER — Other Ambulatory Visit: Payer: Self-pay

## 2019-02-06 DIAGNOSIS — E1129 Type 2 diabetes mellitus with other diabetic kidney complication: Secondary | ICD-10-CM

## 2019-02-06 DIAGNOSIS — I152 Hypertension secondary to endocrine disorders: Secondary | ICD-10-CM

## 2019-02-06 DIAGNOSIS — E785 Hyperlipidemia, unspecified: Secondary | ICD-10-CM

## 2019-02-06 DIAGNOSIS — E1159 Type 2 diabetes mellitus with other circulatory complications: Secondary | ICD-10-CM

## 2019-02-06 DIAGNOSIS — I10 Essential (primary) hypertension: Principal | ICD-10-CM

## 2019-02-06 DIAGNOSIS — E1169 Type 2 diabetes mellitus with other specified complication: Secondary | ICD-10-CM

## 2019-02-06 MED ORDER — AMLODIPINE BESYLATE 10 MG PO TABS: 10.0000 mg | ORAL_TABLET | Freq: Every day | ORAL | 0 refills | Status: DC

## 2019-02-06 MED ORDER — ATORVASTATIN CALCIUM 20 MG PO TABS: 20.0000 mg | ORAL_TABLET | Freq: Every day | ORAL | 0 refills | Status: DC

## 2019-02-06 MED ORDER — LISINOPRIL 20 MG PO TABS: 20.0000 mg | ORAL_TABLET | Freq: Every day | ORAL | 0 refills | Status: DC

## 2019-02-06 MED ORDER — METFORMIN HCL ER 500 MG PO TB24: ORAL_TABLET | ORAL | 0 refills | Status: DC

## 2019-02-06 NOTE — Telephone Encounter (Signed)
Refills sent in patient notified. MPulliam, CMA/RT(R)

## 2019-03-22 ENCOUNTER — Other Ambulatory Visit: Payer: Medicare Other

## 2019-03-27 ENCOUNTER — Other Ambulatory Visit: Payer: Self-pay

## 2019-03-27 ENCOUNTER — Encounter: Payer: Self-pay | Admitting: Family Medicine

## 2019-03-27 ENCOUNTER — Ambulatory Visit (INDEPENDENT_AMBULATORY_CARE_PROVIDER_SITE_OTHER): Payer: Medicare Other | Admitting: Family Medicine

## 2019-03-27 VITALS — HR 70 | Ht 72.0 in | Wt 195.0 lb

## 2019-03-27 DIAGNOSIS — N183 Chronic kidney disease, stage 3 unspecified: Secondary | ICD-10-CM

## 2019-03-27 DIAGNOSIS — D510 Vitamin B12 deficiency anemia due to intrinsic factor deficiency: Secondary | ICD-10-CM | POA: Diagnosis not present

## 2019-03-27 DIAGNOSIS — I129 Hypertensive chronic kidney disease with stage 1 through stage 4 chronic kidney disease, or unspecified chronic kidney disease: Secondary | ICD-10-CM | POA: Diagnosis not present

## 2019-03-27 DIAGNOSIS — E1159 Type 2 diabetes mellitus with other circulatory complications: Secondary | ICD-10-CM | POA: Diagnosis not present

## 2019-03-27 DIAGNOSIS — E1129 Type 2 diabetes mellitus with other diabetic kidney complication: Secondary | ICD-10-CM

## 2019-03-27 DIAGNOSIS — E1122 Type 2 diabetes mellitus with diabetic chronic kidney disease: Secondary | ICD-10-CM | POA: Diagnosis not present

## 2019-03-27 DIAGNOSIS — E538 Deficiency of other specified B group vitamins: Secondary | ICD-10-CM | POA: Diagnosis not present

## 2019-03-27 DIAGNOSIS — Z79899 Other long term (current) drug therapy: Secondary | ICD-10-CM

## 2019-03-27 DIAGNOSIS — I10 Essential (primary) hypertension: Secondary | ICD-10-CM | POA: Diagnosis not present

## 2019-03-27 DIAGNOSIS — K219 Gastro-esophageal reflux disease without esophagitis: Secondary | ICD-10-CM

## 2019-03-27 DIAGNOSIS — E559 Vitamin D deficiency, unspecified: Secondary | ICD-10-CM

## 2019-03-27 DIAGNOSIS — E1169 Type 2 diabetes mellitus with other specified complication: Secondary | ICD-10-CM

## 2019-03-27 DIAGNOSIS — E785 Hyperlipidemia, unspecified: Secondary | ICD-10-CM | POA: Diagnosis not present

## 2019-03-27 DIAGNOSIS — I152 Hypertension secondary to endocrine disorders: Secondary | ICD-10-CM

## 2019-03-27 NOTE — Progress Notes (Signed)
Telehealth office visit note for Nathan Gomez, D.O- at [facility] Care at [facility]   I connected with current patient today and verified that I am speaking with the correct person using two identifiers.   . Location of the patient: Home . Location of the provider: home Only the patient (+/- their family members at pt's discretion) and myself were participating in the encounter    - This visit type was conducted due to national recommendations for restrictions regarding the COVID-19 Pandemic (e.g. social distancing) in an effort to limit this patient's exposure and mitigate transmission in our community.  This format is felt to be most appropriate for this patient at this time.   - The patient did not have access to video technology or had technical difficulties with video requiring transitioning to audio format only. - No physical exam could be performed with this format, beyond that communicated to [place] by the patient/ family members as noted.   - Additionally my office staff/ schedulers discussed with the patient that there may be a monetary charge related to this service, depending on their medical insurance.   The patient expressed understanding, and agreed to proceed.       History of Present Illness:   HPI:  Nathan Gomez y.o. male presents for routine chronic follow up for multiple medical problems.  - pt has been at his condo in [place] with wife Pleas Koch, who is also my patient, for 9 wks now.  - pt walking 45 min daily- [place]   1) Diabetes Type 2 Pt reports compliance with medications and/ or treatment plan Denies medication or current treatment plan S-E  Home fasting glucose readings range -  not checking 110-125 ; 2 hr PP-  150-160 Denies polyuria/polydipsia. Denies hypoglycemia symptoms  Last diabetic eye exam was  Lab Results  Component Value Date   HMDIABEYEEXA No Retinopathy 08/04/2018    Lab Results  Component Value  Date   HGBA1C 6.4 (A) 09/26/2018   HGBA1C 6.1 (H) 04/08/2018   HGBA1C 6.6 12/14/2017    Lab Results  Component Value Date   MICROALBUR 10 04/08/2017   LLyons78 04/08/2018   CREATININE 1.49 (H) 09/19/2018        2) Hyperlipidemia  Pt reports compliance with meds and/or treatment plan such as low sat/trans fat and low cholesterol diet and routine exercise  - trying to drink more water- but usually doesn't  Denies Medication S-E  (RUQ pain; Muscle aches )   Last lipid panel as follows:  Lab Results  Component Value Date   CHOL 145 04/08/2018   HDL 39 (L) 04/08/2018   LDLCALC 78 04/08/2018   LDLDIRECT 80.7 02/22/2012   TRIG 139 04/08/2018   CHOLHDL 3.7 04/08/2018    Lab Results  Component Value Date   ALT 15 09/19/2018       3) Hypertension  Pt reports compliance with medications and/ or treatment plan-on amlodipine as well as lisinopril for renal protection due to his chronic kidney disease. Denies medication S-E. Home Blood pressure range-  Doesn't have BP cuff at the beach. Hasn't checked it regulalry  Low salt diet-    y  Exercise-    y Smoking Status noted  Patient denies new onset of sx- no chest pain, dizziness, HA, DIB/ shortness of breath or swelling.  Lab Results  Component Value Date   CREATININE 1.49 (H) 09/19/2018    Last 3 blood pressure  readings in our office are as follows: BP Readings from Last 3 Encounters:  11/19/18 (!) 160/88  09/26/18 111/71  08/12/18 117/65    The CVD Risk score (D'Agostino, et al., 2008) failed to calculate for the following reasons:   CVD risk score not calculated     4) Weight:  - overwt-- not checking Wt Readings from Last 3 Encounters:  03/27/19 195 lb (88.5 kg)  11/19/18 198 lb (89.8 kg)  09/26/18 197 lb 3.2 oz (89.4 kg)    BMI Readings from Last 3 Encounters:  03/27/19 26.45 kg/m  11/19/18 26.85 kg/m  09/26/18 27.50 kg/m       Patient Care Team    Relationship Specialty  Notifications Start End  Nathan Dance, DO PCP - General Family Medicine  04/08/17   Gatha Mayer, MD Consulting Physician Gastroenterology  09/28/11   Darleen Crocker, MD  Ophthalmology  09/16/12   Melrose Nakayama, MD  Orthopedic Surgery  12/05/13   Kathie Rhodes, MD  Urology  07/30/15   Katy Apo, MD Consulting Physician Ophthalmology  04/08/17   Arlyss Gandy, PA-C  Dermatology  09/13/17       Impression and Recommendations:    1. 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 Hypertension associated with diabetes ([place])   3. stage 3 chronic kidney disease due to type 2 diabetes mellitus ([place])   4. Hyperlipidemia associated with type 2 diabetes mellitus ([place])   5. Gastroesophageal reflux disease, esophagitis presence not specified   6. Pernicious anemia   7. Vitamin D deficiency   8. B12 deficiency   9. High risk medication use     -A1c well controlled at 6.6 approximately 4 months ago.  Patient will come in at the [facility]month[facility] for recheck in the near future.  Continue to monitor at home.  Encouraged healthy diet and exercise to continue.  -He does not have a way to check his blood pressure at his current place of residence.  I did stress the importance of home monitoring to patient.  For the past 9 weeks [place] been at the beach [place]  He has no complaints and feeling well.  Continue medications and monitoring once he returns.  -We will recheck CMP and all of his labs in the near future in about a month or so.  Importance of proper hydration again reviewed with patient and he still struggles to drink adequate amounts of water.  -Continue Lipitor 20 mg nightly.  Last checked LDL at 78.  Will recheck yearly.  -Reflux well controlled.  Usually takes medicine every other day.  Discussed diet and lifestyle modifications in addition to meds.   -Patient was lost to follow-up for his anemia panel.  I spoke with my CMA and asked her to please make sure these get drawn when patient comes in in about a month for his other routine labs.  -Continue vitamin D supplements as well as B12.  We will check levels near future  -Due to high risk medication use of metformin, will check magnesium in the near future as well.  - As part of my medical decision making, I reviewed the following data within the [place] obtained from pt /family, CMA notes reviewed and incorporated if applicable, Labs reviewed, Radiograph/ tests reviewed if applicable and OV notes from prior OV's with me, as well as other  specialists she/he has seen since seeing me last, were all reviewed and used in my medical decision making process today.   - Additionally, discussion had with patient regarding txmnt plan, and their biases/concerns about that plan were used in my medical decision making today.   - The patient agreed with the plan and demonstrated an understanding of the instructions.   No barriers to understanding were identified.   - Red flag symptoms and signs discussed in detail.  Patient expressed understanding regarding what to do in case of emergency_0 urgent symptoms.  The patient was advised to call back or seek an in-person evaluation if the symptoms worsen or if the condition fails to improve as anticipated.   Return for FBW 1 mo;  OV with me 8moor sooner if concerns.    Medications Discontinued During This Encounter  Medication Reason  . scopolamine (TRANSDERM-SCOP, 1.5 MG,) 1 MG/3DAYS No longer needed (for PRN medications)  . ranitidine (ZANTAC) 150 MG tablet Discontinued by provider  . cefaclor (CECLOR) 500 MG capsule       I provided 26 minutes of non-face-to-face time during this encounter,with over 50% of the time in direct counseling on patients medical conditions/ medical concerns.  Additional time was spent with charting and  coordination of care after the actual visit commenced.   Note:  This note was prepared with assistance of Dragon voice recognition software. Occasional wrong-word or sound-a-like substitutions may have occurred due to the inherent limitations of voice recognition software.  DMellody Dance DO    -Vitals obtained; medications/ allergies reconciled;  personal medical, social, Sx etc.histories were updated by CMA, reviewed by me and are reflected in chart   Patient Care Team    Relationship Specialty Notifications Start End  OMellody Dance DO PCP - General Family Medicine  04/08/17   GGatha Mayer MD Consulting Physician Gastroenterology  09/28/11   BDarleen Crocker MD  Ophthalmology  09/16/12   DMelrose Nakayama MD  Orthopedic Surgery  12/05/13   OKathie Rhodes MD  Urology  07/30/15   LKaty Apo MD Consulting Physician Ophthalmology  04/08/17   CArlyss Gandy PA-C  Dermatology  09/13/17      Patient Active Problem List   Diagnosis Date Noted  . stage 3 chronic kidney disease due to type 2 diabetes mellitus ([place] 02/22/2017    Priority: High  . Diabetes mellitus type 2, controlled ([place] 01/05/2011    Priority: High  . Hyperlipidemia associated with type 2 diabetes mellitus ([place] 01/05/2011    Priority: High  . Hypertension associated with diabetes ([place] 01/05/2011    Priority: High  . GERD (gastroesophageal reflux disease) 09/12/2017    Priority: Medium  . Urinary retention 07/30/2015    Priority: Medium  . Personal history of colonic adenomas 01/16/2013    Priority: Medium  . Hypomagnesemia 04/08/2016    Priority: Low  . B12 deficiency 01/30/2016    Priority: Low  . Pernicious anemia 02/22/2012    Priority: Low  . Anemia     Priority: Low  . Osteoarthritis 01/05/2011    Priority: Low  . High risk medication use 09/26/2018  . Vitamin D deficiency 09/12/2017  . Ruptured, tendon, Achilles, right, sequela 04/08/2017  . Elevated PSA 08/25/2016  . Primary  osteoarthritis of right knee 07/09/2015     Current Meds  Medication Sig  . amLODipine (NORVASC) 10 MG tablet Take 1 tablet (10 mg total) by mouth daily.  .Marland Kitchenatorvastatin (LIPITOR) 20 MG tablet Take 1 tablet (20 mg  total) by mouth at bedtime.  . blood glucose meter kit and supplies KIT Use to test blood sugar up to twice a day. DX E11.09  . Cholecalciferol (VITAMIN D) 2000 units CAPS Take 1 capsule (2,000 Units total) by mouth every morning.  . Cyanocobalamin (VITAMIN B12 PO) Take 1 tablet by mouth daily.  Marland Kitchen GLUCOSAMINE SULFATE PO Take 1 tablet by mouth daily.  Marland Kitchen glucose blood test strip Use as instructed to test blood sugar up to twice a day. DX:  E11.09  . Krill Oil 300 MG CAPS Take 500 mg by mouth daily.   . Lancets 95M MISC Delica Fine Lancets. Use to test blood sugar up to twice a day. DX E11.09  . lisinopril (PRINIVIL,ZESTRIL) 20 MG tablet Take 1 tablet (20 mg total) by mouth daily.  Marland Kitchen loratadine (CLARITIN) 10 MG tablet Take 1 tablet (10 mg total) by mouth daily. (Patient taking differently: Take 10 mg by mouth daily as needed for allergies. )  . magnesium oxide (MAG-OX) 400 MG tablet Take 1 tablet (400 mg total) by mouth 3 (three) times daily.  . metFORMIN (GLUCOPHAGE-XR) 500 MG 24 hr tablet 500 MG q 12 hrs daily  . Multiple Vitamins-Minerals (SENIOR MULTIVITAMIN PLUS) TABS Take 1 tablet by mouth daily.    Marland Kitchen omeprazole (PRILOSEC) 20 MG capsule Take 1 capsule (20 mg total) by mouth at bedtime. (Patient taking differently: Take 20 mg by mouth every other day. )  . tamsulosin (FLOMAX) 0.4 MG CAPS capsule Take 1 capsule (0.4 mg total) by mouth at bedtime. (Patient taking differently: Take 0.4-0.8 mg by mouth See admin instructions. Take 0.4 mg by mouth daily. Starting 7 days before procedure take 0.18m by mouth daily)  . Triamcinolone Acetonide (NASACORT AQ NA) Place 1 spray into the nose daily as needed (allergies).   Current Facility-Administered Medications for the 03/27/19 encounter  (Office Visit) with OMellody Dance DO  Medication  . 0.9 %  sodium chloride infusion     Allergies:  Allergies  Allergen Reactions  . Nsaids     Kidney Disease  . Sulfa Antibiotics Other (See Comments)    As child    Not sure rxn  . Thiazide-Type Diuretics Other (See Comments)    Unknown     ROS:  See above HPI for pertinent positives and negatives   Objective:   Pulse 70, height 6' (1.829 m), weight 195 lb (88.5 kg).  (if some vitals are omitted, this means that patient was UNABLE to obtain them even though they were asked to get them prior to OV today.  They were asked to call [place] their earliest convenience with these once obtained. )  General: A & O * 3; sounds in no acute distress; in usual state of health.  Skin: Pt confirms warm and dry extremities and pink fingertips HEENT: Pt confirms lips non-cyanotic Chest: Patient confirms normal chest excursion and movement Respiratory: speaking in full sentences, no conversational dyspnea; patient confirms no use of accessory muscles Psych: insight appears good, mood- appears full

## 2019-05-01 ENCOUNTER — Other Ambulatory Visit: Payer: Self-pay | Admitting: Family Medicine

## 2019-05-01 DIAGNOSIS — I152 Hypertension secondary to endocrine disorders: Secondary | ICD-10-CM

## 2019-05-01 DIAGNOSIS — E1129 Type 2 diabetes mellitus with other diabetic kidney complication: Secondary | ICD-10-CM

## 2019-05-01 DIAGNOSIS — E1159 Type 2 diabetes mellitus with other circulatory complications: Secondary | ICD-10-CM

## 2019-05-05 ENCOUNTER — Other Ambulatory Visit: Payer: Self-pay | Admitting: Family Medicine

## 2019-05-05 DIAGNOSIS — I152 Hypertension secondary to endocrine disorders: Secondary | ICD-10-CM

## 2019-05-05 DIAGNOSIS — E785 Hyperlipidemia, unspecified: Secondary | ICD-10-CM

## 2019-05-05 DIAGNOSIS — E1159 Type 2 diabetes mellitus with other circulatory complications: Secondary | ICD-10-CM

## 2019-05-05 DIAGNOSIS — E1169 Type 2 diabetes mellitus with other specified complication: Secondary | ICD-10-CM

## 2019-05-10 ENCOUNTER — Other Ambulatory Visit: Payer: Self-pay

## 2019-05-10 DIAGNOSIS — D510 Vitamin B12 deficiency anemia due to intrinsic factor deficiency: Secondary | ICD-10-CM

## 2019-05-10 DIAGNOSIS — I129 Hypertensive chronic kidney disease with stage 1 through stage 4 chronic kidney disease, or unspecified chronic kidney disease: Secondary | ICD-10-CM

## 2019-05-10 DIAGNOSIS — E1122 Type 2 diabetes mellitus with diabetic chronic kidney disease: Secondary | ICD-10-CM

## 2019-05-10 DIAGNOSIS — E559 Vitamin D deficiency, unspecified: Secondary | ICD-10-CM

## 2019-05-10 DIAGNOSIS — Z79899 Other long term (current) drug therapy: Secondary | ICD-10-CM

## 2019-05-10 DIAGNOSIS — N183 Chronic kidney disease, stage 3 unspecified: Secondary | ICD-10-CM

## 2019-05-10 DIAGNOSIS — E1159 Type 2 diabetes mellitus with other circulatory complications: Secondary | ICD-10-CM

## 2019-05-10 DIAGNOSIS — I152 Hypertension secondary to endocrine disorders: Secondary | ICD-10-CM

## 2019-05-10 DIAGNOSIS — D649 Anemia, unspecified: Secondary | ICD-10-CM

## 2019-05-10 DIAGNOSIS — E1129 Type 2 diabetes mellitus with other diabetic kidney complication: Secondary | ICD-10-CM

## 2019-05-10 DIAGNOSIS — E1169 Type 2 diabetes mellitus with other specified complication: Secondary | ICD-10-CM

## 2019-05-24 DIAGNOSIS — M1712 Unilateral primary osteoarthritis, left knee: Secondary | ICD-10-CM | POA: Diagnosis not present

## 2019-06-12 DIAGNOSIS — M1712 Unilateral primary osteoarthritis, left knee: Secondary | ICD-10-CM | POA: Diagnosis not present

## 2019-06-15 ENCOUNTER — Other Ambulatory Visit: Payer: Self-pay

## 2019-06-19 DIAGNOSIS — M1712 Unilateral primary osteoarthritis, left knee: Secondary | ICD-10-CM | POA: Diagnosis not present

## 2019-06-26 DIAGNOSIS — M1712 Unilateral primary osteoarthritis, left knee: Secondary | ICD-10-CM | POA: Diagnosis not present

## 2019-07-05 ENCOUNTER — Other Ambulatory Visit: Payer: Medicare Other

## 2019-07-05 ENCOUNTER — Other Ambulatory Visit: Payer: Self-pay

## 2019-07-05 DIAGNOSIS — D510 Vitamin B12 deficiency anemia due to intrinsic factor deficiency: Secondary | ICD-10-CM | POA: Diagnosis not present

## 2019-07-05 DIAGNOSIS — N183 Chronic kidney disease, stage 3 unspecified: Secondary | ICD-10-CM

## 2019-07-05 DIAGNOSIS — Z79899 Other long term (current) drug therapy: Secondary | ICD-10-CM

## 2019-07-05 DIAGNOSIS — I129 Hypertensive chronic kidney disease with stage 1 through stage 4 chronic kidney disease, or unspecified chronic kidney disease: Secondary | ICD-10-CM | POA: Diagnosis not present

## 2019-07-05 DIAGNOSIS — E1122 Type 2 diabetes mellitus with diabetic chronic kidney disease: Secondary | ICD-10-CM | POA: Diagnosis not present

## 2019-07-05 DIAGNOSIS — D649 Anemia, unspecified: Secondary | ICD-10-CM | POA: Diagnosis not present

## 2019-07-05 DIAGNOSIS — E559 Vitamin D deficiency, unspecified: Secondary | ICD-10-CM | POA: Diagnosis not present

## 2019-07-05 DIAGNOSIS — I152 Hypertension secondary to endocrine disorders: Secondary | ICD-10-CM

## 2019-07-05 DIAGNOSIS — I10 Essential (primary) hypertension: Secondary | ICD-10-CM | POA: Diagnosis not present

## 2019-07-05 DIAGNOSIS — E1169 Type 2 diabetes mellitus with other specified complication: Secondary | ICD-10-CM

## 2019-07-05 DIAGNOSIS — E785 Hyperlipidemia, unspecified: Secondary | ICD-10-CM | POA: Diagnosis not present

## 2019-07-05 DIAGNOSIS — E1159 Type 2 diabetes mellitus with other circulatory complications: Secondary | ICD-10-CM

## 2019-07-05 DIAGNOSIS — E1129 Type 2 diabetes mellitus with other diabetic kidney complication: Secondary | ICD-10-CM | POA: Diagnosis not present

## 2019-07-06 LAB — LIPID PANEL
Chol/HDL Ratio: 3.8 ratio (ref 0.0–5.0)
Cholesterol, Total: 151 mg/dL (ref 100–199)
HDL: 40 mg/dL (ref >39)
LDL Calculated: 79 mg/dL (ref 0–99)
Triglycerides: 162 mg/dL — ABNORMAL HIGH (ref 0–149)
VLDL Cholesterol Cal: 32 mg/dL (ref 5–40)

## 2019-07-06 LAB — COMPREHENSIVE METABOLIC PANEL
ALT: 12 IU/L (ref 0–44)
AST: 14 IU/L (ref 0–40)
Albumin/Globulin Ratio: 2 (ref 1.2–2.2)
Albumin: 4.3 g/dL (ref 3.7–4.7)
Alkaline Phosphatase: 106 IU/L (ref 39–117)
BUN/Creatinine Ratio: 16 (ref 10–24)
BUN: 24 mg/dL (ref 8–27)
Bilirubin Total: 0.5 mg/dL (ref 0.0–1.2)
CO2: 21 mmol/L (ref 20–29)
Calcium: 9.2 mg/dL (ref 8.6–10.2)
Chloride: 103 mmol/L (ref 96–106)
Creatinine, Ser: 1.53 mg/dL — ABNORMAL HIGH (ref 0.76–1.27)
GFR calc Af Amer: 49 mL/min/{1.73_m2} — ABNORMAL LOW (ref >59)
GFR calc non Af Amer: 42 mL/min/{1.73_m2} — ABNORMAL LOW (ref >59)
Globulin, Total: 2.2 g/dL (ref 1.5–4.5)
Glucose: 153 mg/dL — ABNORMAL HIGH (ref 65–99)
Potassium: 5.3 mmol/L — ABNORMAL HIGH (ref 3.5–5.2)
Sodium: 139 mmol/L (ref 134–144)
Total Protein: 6.5 g/dL (ref 6.0–8.5)

## 2019-07-06 LAB — CBC WITH DIFFERENTIAL/PLATELET
Basophils Absolute: 0 10*3/uL (ref 0.0–0.2)
Basos: 0 %
EOS (ABSOLUTE): 0.1 10*3/uL (ref 0.0–0.4)
Eos: 2 %
Hematocrit: 37.5 % (ref 37.5–51.0)
Hemoglobin: 12.5 g/dL — ABNORMAL LOW (ref 13.0–17.7)
Immature Grans (Abs): 0 10*3/uL (ref 0.0–0.1)
Immature Granulocytes: 0 %
Lymphocytes Absolute: 1.2 10*3/uL (ref 0.7–3.1)
Lymphs: 25 %
MCH: 30.9 pg (ref 26.6–33.0)
MCHC: 33.3 g/dL (ref 31.5–35.7)
MCV: 93 fL (ref 79–97)
Monocytes Absolute: 0.4 10*3/uL (ref 0.1–0.9)
Monocytes: 8 %
Neutrophils Absolute: 3.2 10*3/uL (ref 1.4–7.0)
Neutrophils: 65 %
Platelets: 188 10*3/uL (ref 150–450)
RBC: 4.04 x10E6/uL — ABNORMAL LOW (ref 4.14–5.80)
RDW: 12.6 % (ref 11.6–15.4)
WBC: 5 10*3/uL (ref 3.4–10.8)

## 2019-07-06 LAB — T4, FREE: Free T4: 1.23 ng/dL (ref 0.82–1.77)

## 2019-07-06 LAB — HEMOGLOBIN A1C
Est. average glucose Bld gHb Est-mCnc: 151 mg/dL
Est. average glucose Bld gHb Est-mCnc: 151 mg/dL
Hgb A1c MFr Bld: 6.9 % — ABNORMAL HIGH (ref 4.8–5.6)
Hgb A1c MFr Bld: 6.9 % — ABNORMAL HIGH (ref 4.8–5.6)

## 2019-07-06 LAB — TSH: TSH: 2.7 u[IU]/mL (ref 0.450–4.500)

## 2019-07-06 LAB — VITAMIN D 25 HYDROXY (VIT D DEFICIENCY, FRACTURES): Vit D, 25-Hydroxy: 36.7 ng/mL (ref 30.0–100.0)

## 2019-07-06 LAB — VITAMIN B12: Vitamin B-12: 517 pg/mL (ref 232–1245)

## 2019-07-20 ENCOUNTER — Telehealth: Payer: Self-pay | Admitting: Family Medicine

## 2019-07-20 NOTE — Telephone Encounter (Signed)
If that is true and there is a recall on the long-acting metformin, -Patient can go on the equally potent dose of the 12-hour formula. -Please send in prescription for plain metformin 500 mg Sig: Take 2 every morning and 2 every afternoon 120, 2 rf

## 2019-07-20 NOTE — Telephone Encounter (Signed)
Patient called to say received a Manufactures recall letter re:   metFORMIN (GLUCOPHAGE-XR) 500 MG 24 hr tablet [id number]   Order Details Dose, Route, Frequency: As Directed  Dispense Quantity: 180 tablet Refills: 0 Fills remaining: --        Sig: TAKE 1 TABLET BY MOUTH EVERY 12 HOURS     ---FYI to provider --- Per patient they are advising an alternative medication.  --glh

## 2019-07-24 MED ORDER — METFORMIN HCL 500 MG PO TABS: 1000.0000 mg | ORAL_TABLET | Freq: Two times a day (BID) | ORAL | 2 refills | Status: DC

## 2019-07-24 NOTE — Telephone Encounter (Signed)
RX sent.  Charyl Bigger, CMA

## 2019-08-07 ENCOUNTER — Ambulatory Visit: Payer: Medicare Other | Admitting: Family Medicine

## 2019-08-07 ENCOUNTER — Other Ambulatory Visit: Payer: Self-pay

## 2019-08-07 ENCOUNTER — Telehealth: Payer: Self-pay | Admitting: Family Medicine

## 2019-08-07 MED ORDER — METFORMIN HCL 500 MG PO TABS: 1000.0000 mg | ORAL_TABLET | Freq: Two times a day (BID) | ORAL | 0 refills | Status: DC

## 2019-08-07 NOTE — Telephone Encounter (Signed)
Completed and mychart message sent to patient

## 2019-08-07 NOTE — Telephone Encounter (Signed)
Patient would like to discuss his metformin refill and how it was ordered. He says he needs a 90 day supply for insurance to pay but how the order was written makes it seem like it was a 30 day supply. I advised patient that PCP ordered a 30 day supply with 2 refills making it a true 90 day set but he says [facility] is not seeing it that way. Please advise if we can write order differently to help fix this problem

## 2019-08-11 DIAGNOSIS — E119 Type 2 diabetes mellitus without complications: Secondary | ICD-10-CM | POA: Diagnosis not present

## 2019-08-11 DIAGNOSIS — Z961 Presence of intraocular lens: Secondary | ICD-10-CM | POA: Diagnosis not present

## 2019-08-11 DIAGNOSIS — H26493 Other secondary cataract, bilateral: Secondary | ICD-10-CM | POA: Diagnosis not present

## 2019-08-11 LAB — HM DIABETES EYE EXAM

## 2019-08-14 DIAGNOSIS — M79671 Pain in right foot: Secondary | ICD-10-CM | POA: Diagnosis not present

## 2019-08-14 DIAGNOSIS — M722 Plantar fascial fibromatosis: Secondary | ICD-10-CM | POA: Diagnosis not present

## 2019-08-15 ENCOUNTER — Ambulatory Visit (INDEPENDENT_AMBULATORY_CARE_PROVIDER_SITE_OTHER): Payer: Medicare Other | Admitting: Family Medicine

## 2019-08-15 ENCOUNTER — Other Ambulatory Visit: Payer: Self-pay

## 2019-08-15 ENCOUNTER — Encounter: Payer: Self-pay | Admitting: Family Medicine

## 2019-08-15 VITALS — BP 116/67 | HR 70 | Temp 98.4°F | Wt 193.9 lb

## 2019-08-15 DIAGNOSIS — I129 Hypertensive chronic kidney disease with stage 1 through stage 4 chronic kidney disease, or unspecified chronic kidney disease: Secondary | ICD-10-CM | POA: Diagnosis not present

## 2019-08-15 DIAGNOSIS — N183 Chronic kidney disease, stage 3 unspecified: Secondary | ICD-10-CM

## 2019-08-15 DIAGNOSIS — Z636 Dependent relative needing care at home: Secondary | ICD-10-CM | POA: Diagnosis not present

## 2019-08-15 DIAGNOSIS — E1169 Type 2 diabetes mellitus with other specified complication: Secondary | ICD-10-CM

## 2019-08-15 DIAGNOSIS — E1159 Type 2 diabetes mellitus with other circulatory complications: Secondary | ICD-10-CM

## 2019-08-15 DIAGNOSIS — Z23 Encounter for immunization: Secondary | ICD-10-CM

## 2019-08-15 DIAGNOSIS — Z630 Problems in relationship with spouse or partner: Secondary | ICD-10-CM | POA: Diagnosis not present

## 2019-08-15 DIAGNOSIS — E1129 Type 2 diabetes mellitus with other diabetic kidney complication: Secondary | ICD-10-CM | POA: Diagnosis not present

## 2019-08-15 DIAGNOSIS — I10 Essential (primary) hypertension: Secondary | ICD-10-CM

## 2019-08-15 DIAGNOSIS — I152 Hypertension secondary to endocrine disorders: Secondary | ICD-10-CM

## 2019-08-15 DIAGNOSIS — E1122 Type 2 diabetes mellitus with diabetic chronic kidney disease: Secondary | ICD-10-CM

## 2019-08-15 DIAGNOSIS — E785 Hyperlipidemia, unspecified: Secondary | ICD-10-CM | POA: Diagnosis not present

## 2019-08-15 MED ORDER — LISINOPRIL 20 MG PO TABS: 20.0000 mg | ORAL_TABLET | Freq: Every day | ORAL | 1 refills | Status: DC

## 2019-08-15 MED ORDER — AMLODIPINE BESYLATE 10 MG PO TABS: 10.0000 mg | ORAL_TABLET | Freq: Every day | ORAL | 1 refills | Status: DC

## 2019-08-15 MED ORDER — ATORVASTATIN CALCIUM 20 MG PO TABS: ORAL_TABLET | ORAL | 1 refills | Status: DC

## 2019-08-15 NOTE — Progress Notes (Signed)
Impression and Recommendations:    1. 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 Hyperlipidemia associated with type 2 diabetes mellitus ([place])   3. Hypertension associated with diabetes ([place])   4. stage 3 chronic kidney disease due to type 2 diabetes mellitus ([place])   5. Caregiver stress   6. Marital conflict- wife not taking care of herself   7. Need for influenza vaccination     - Last OV was 03/27/2019. - Last blood work obtained 07/05/2019.  - Need for influenza vaccination.  Caregiver Stress - Marital Conflict, Wife Not Taking Care of Herself - Extensive discussion held with patient regarding his concerns about his wife and wife's health.  General recommendations provided and all questions answered.    - Advised patient to try to release some of his stress and frustration regarding his wife. - Encouraged patient not to expect to control his wife's behavior or potential health outcomes.  - If patient feels like his anxiety or general mood is changing or becoming unmanageable due to his concerns about his wife, patient knows to follow up as needed for intervention.  - Will continue to monitor.  Controlled Type 2 Diabetes Mellitus w/ other diabetic kidney complication - Back on 03/19/6143, patient was managed on 24 hour Metformin XR 1000. - Treatment plan was changed to 12 hour Metformin 500 mg, taking two tabs AM and PM.  - A1c was 6.9 last check, up from 6.4 ten months ago. - Advised pt that as an 78 year old man, his goal A1c is under 8.0, ideally under 7.0. - Reviewed with patient that his diabetes is at goal and under control at this time. Lab Results  Component Value Date   HGBA1C 6.9 (H) 07/05/2019   HGBA1C 6.9 (H) 07/05/2019   HGBA1C 6.4 (A) 09/26/2018   - Counseled patient on pathophysiology of disease and discussed various treatment options, which often includes dietary and  lifestyle modifications as first line.  Importance of low carb diet discussed with patient in addition to regular exercise.  - Check FBS and 2 hours after the biggest meal of your day.  Keep log and bring in next OV for my review.   Also, if you ever feel poorly, please check your blood pressure and blood sugar, as one or the other could be the cause of your symptoms.  - Being a diabetic, you need yearly eye and foot exams. Make appt.for diabetic eye exam   - Will continue to monitor.  - Encouraged patient to monitor for SOB, chest tightness, or other such sx with exertion and alleviated with rest.  Discussed red flag cardiac symptoms and need for emergency care if they are experienced.  - Per pt, recently obtained diabetic eye exam through Dr. Prudencio Burly of [facility].  Stage 3 Chronic Kidney Disease due to Type 2 DM - Stable last check. - Will re-check urine and labs PRN. - Will continue to monitor.  Hyperlipidemia associated with Type 2 DM - Pt managed on statin at this time. - Continue treatment plan as established.  See med list below. - Patient tolerating meds well without complication.  Denies S-E  Triglycerides = 162 last check HDL = 40, slightly up from 39 prior. LDL = 79, slightly up from 78 prior.  - Advised patient to continue to drink adequate water and engage in physical activity to help reduce incidence of side effects.  Hypertension associated with Diabetes - BP stable at this time and well-controlled. - Pt denies any episodes of orthostatic dizziness.  - Continue treatment plan as prescribed.  See med list below. - Patient knows to watch for signs of dizziness while sitting down or standing up. - Patient tolerating meds well without complication.  Denies S-E  - Ongoing prudent lifestyle changes such as dash diet and engaging in a regular exercise program discussed with patient.  - Ambulatory BP monitoring STRONGLY encouraged. Keep log and bring in next  OV.  - Will continue to monitor.  Health Counseling & Preventative Health Maintenance - Advised patient to continue working toward exercising to improve overall mental, physical, and emotional health.    - Reviewed the "spokes of the wheel" of mood and health management.  Stressed the importance of ongoing prudent habits, including regular exercise, appropriate sleep hygiene, healthful dietary habits, and prayer/meditation to relax.  - Encouraged patient to engage in daily physical activity as tolerated, especially a formal exercise routine.  Recommended that the patient eventually strive for at least 150 minutes of moderate cardiovascular activity per week according to guidelines established by the [facility].   - Healthy dietary habits encouraged, including low-carb, and high amounts of lean protein in diet.  Reviewed American Heart Association guidelines for healthy diet, basically Mediterranean diet.  - Patient should also consume adequate amounts of water.  - Health counseling performed.  All questions answered.   Education and routine counseling performed. Handouts provided.  Recommendations - Advised patient to return for Medicare Wellness as advised. - Discussed need for patient to continue chronic health maintenance and screenings as recommended.  Pt was interviewed and evaluated by me and my staff in the clinic today for 45.5+ minutes, with over 50% time spent in face to face counseling of patients various medical conditions, treatment plans of those medical conditions including medicine management and lifestyle modification, strategies to improve health and well being; and in coordination of care. SEE ABOVE TREATMENT PLAN FOR DETAILS   Orders Placed This Encounter  Procedures   Flu Vaccine QUAD High Dose(Fluad)    Medications Discontinued During This Encounter  Medication Reason   amLODipine (NORVASC) 10 MG tablet Reorder   atorvastatin (LIPITOR) 20 MG tablet Reorder    lisinopril (ZESTRIL) 20 MG tablet Reorder      Meds ordered this encounter  Medications   atorvastatin (LIPITOR) 20 MG tablet    Sig: TAKE 1 TABLET BY MOUTH EVERYDAY AT BEDTIME    Dispense:  90 tablet    Refill:  1   amLODipine (NORVASC) 10 MG tablet    Sig: Take 1 tablet (10 mg total) by mouth daily.    Dispense:  90 tablet    Refill:  1   lisinopril (ZESTRIL) 20 MG tablet    Sig: Take 1 tablet (20 mg total) by mouth daily.    Dispense:  90 tablet    Refill:  1    The patient was counseled, risk factors were discussed, anticipatory guidance given.  Gross side effects, risk and benefits, and alternatives of medications discussed with patient.  Patient is aware that all medications have potential side effects and we are unable to predict every side effect or drug-drug interaction that may occur.  Expresses verbal understanding and consents to current therapy plan and treatment regimen.   Return for 3-4 mo f/up for DM, Bp, etc.   Please see AVS handed out to patient at the end of our visit for further  patient instructions/ counseling done pertaining to today's office visit.    Note:  This document was prepared using Dragon voice recognition software and may include unintentional dictation errors.  This document serves as a record of services personally performed by Mellody Dance, DO. It was created on her behalf by Toni Amend, a trained medical scribe. The creation of this record is based on the scribe's personal observations and the provider's statements to them.   I have reviewed the above medical documentation for accuracy and completeness and I concur.  Mellody Dance, DO 08/17/2019 8:33 PM     Subjective:    Nathan Gomez is a 78 y.o. male who presents to [place] at [facility] today for Diabetes Management.    - Pt in emotional distress.   Concerns about Wife Notes he's had a lot of concerns about his wife lately.  She had surgery on  her hands six weeks ago, and is in a lot of pain.  Says "we've had many discussions and arguments about it."  Patient states "I'm surprised she's having that much pain," and indicates that it is causing him distress.   Notes she wouldn't do any of her assigned exercises after surgery, and was told by the doctor "you're not going to get any better if you don't do these exercises."   Patient notes his wife has been going to PT twice a week, and "I told her time and time again to talk to them about your pain."  Says "she lays on the couch moaning most of the time."   Says she "finally started doing her exercises."   He has tried to write out questions for her to ask them but pt is just very worried   DM HPI: -  He has not been working too closely on diet and exercise for diabetes.  Notes he knew his A1c would be up because he's been a bit less strict about his habits.  Pt is currently maintained on the following medications for diabetes:   see med list today Medication compliance - continues medications as prescribed.  Home glucose readings range: notes consistent from before. Denies low or high blood sugars at home.  Obtained yearly diabetic eye exam with Dr. Prudencio Burly through [facility].   Denies polyuria/polydipsia. Denies hypo/ hyperglycemia symptoms - He denies new onset of: chest pain, exercise intolerance, shortness of breath, dizziness, visual changes, headache, lower extremity swelling or claudication.  Says once in a while when he goes upstairs, he gets "a little bit winded," but "that doesn't usually happen."  This is not new  Last diabetic eye exam was  Lab Results  Component Value Date   HMDIABEYEEXA No Retinopathy 08/04/2018    Foot exam- UTD  Last A1C in the office was:  Lab Results  Component Value Date   HGBA1C 6.9 (H) 07/05/2019   HGBA1C 6.9 (H) 07/05/2019   HGBA1C 6.4 (A) 09/26/2018    Lab Results  Component Value Date   MICROALBUR 10 04/08/2017    Sidney 79 07/05/2019   CREATININE 1.53 (H) 07/05/2019     HTN HPI:  -  His blood pressure may or may not be controlled at home.  Pt is not checking it at home.   Says "I think I have a blood pressure cuff, I just never use it."   Denies orthostatic dizziness or lightheadedness.  "I don't think I've really had any dizzy spells."  - Patient reports good compliance with blood pressure  medications  - Denies medication S-E  - He denies new onset of: chest pain, exercise intolerance, shortness of breath, dizziness, visual changes, headache, lower extremity swelling or claudication.   Last 3 blood pressure readings in our office are as follows: BP Readings from Last 3 Encounters:  08/15/19 116/67  11/19/18 (!) 160/88  09/26/18 111/71    Filed Weights   08/15/19 0927  Weight: 193 lb 14.4 oz (88 kg)    1. 78 y.o. male here for cholesterol follow-up.   - Patient reports good compliance with medications or treatment plan  - Denies medication S-E  - States "I know I should be drinking more water."  Notes he is not always very hydrated.   - Smoking Status noted   - He denies new onset of: chest pain, exercise intolerance, shortness of breath, dizziness, visual changes, headache, lower extremity swelling or claudication.   Denies myalgias  The cholesterol last visit was:  Lab Results  Component Value Date   CHOL 151 07/05/2019   HDL 40 07/05/2019   LDLCALC 79 07/05/2019   LDLDIRECT 80.7 02/22/2012   TRIG 162 (H) 07/05/2019   CHOLHDL 3.8 07/05/2019    Hepatic Function Latest Ref Rng & Units 07/05/2019 09/19/2018 04/08/2018  Total Protein 6.0 - 8.5 g/dL 6.5 6.4 6.4  Albumin 3.7 - 4.7 g/dL 4.3 4.3 4.4  AST 0 - 40 IU/L 14 17 15   ALT 0 - 44 IU/L 12 15 15   Alk Phosphatase 39 - 117 IU/L 106 110 80  Total Bilirubin 0.0 - 1.2 mg/dL 0.5 0.3 <0.2  Bilirubin, Direct 0.0 - 0.3 mg/dL - - -     BMI Readings from Last 3 Encounters:  08/15/19 26.30 kg/m  03/27/19 26.45 kg/m    11/19/18 26.85 kg/m   Recent Results (from the past 2160 hour(s))  Hemoglobin A1c     Status: Abnormal   Collection Time: 07/05/19  9:16 AM  Result Value Ref Range   Hgb A1c MFr Bld 6.9 (H) 4.8 - 5.6 %    Comment:          Prediabetes: 5.7 - 6.4          Diabetes: >6.4          Glycemic control for adults with diabetes: <7.0    Est. average glucose Bld gHb Est-mCnc 151 mg/dL  Comprehensive metabolic panel     Status: Abnormal   Collection Time: 07/05/19  9:16 AM  Result Value Ref Range   Glucose 153 (H) 65 - 99 mg/dL   BUN 24 8 - 27 mg/dL   Creatinine, Ser 1.53 (H) 0.76 - 1.27 mg/dL   GFR calc non Af Amer 42 (L) >59 mL/min/1.73   GFR calc Af Amer 49 (L) >59 mL/min/1.73   BUN/Creatinine Ratio 16 10 - 24   Sodium 139 134 - 144 mmol/L   Potassium 5.3 (H) 3.5 - 5.2 mmol/L   Chloride 103 96 - 106 mmol/L   CO2 21 20 - 29 mmol/L   Calcium 9.2 8.6 - 10.2 mg/dL   Total Protein 6.5 6.0 - 8.5 g/dL   Albumin 4.3 3.7 - 4.7 g/dL   Globulin, Total 2.2 1.5 - 4.5 g/dL   Albumin/Globulin Ratio 2.0 1.2 - 2.2   Bilirubin Total 0.5 0.0 - 1.2 mg/dL   Alkaline Phosphatase 106 39 - 117 IU/L   AST 14 0 - 40 IU/L   ALT 12 0 - 44 IU/L  CBC with Differential/Platelet     Status: Abnormal  Collection Time: 07/05/19  9:16 AM  Result Value Ref Range   WBC 5.0 3.4 - 10.8 x10E3/uL   RBC 4.04 (L) 4.14 - 5.80 x10E6/uL   Hemoglobin 12.5 (L) 13.0 - 17.7 g/dL   Hematocrit 37.5 37.5 - 51.0 %   MCV 93 79 - 97 fL   MCH 30.9 26.6 - 33.0 pg   MCHC 33.3 31.5 - 35.7 g/dL   RDW 12.6 11.6 - 15.4 %   Platelets 188 150 - 450 x10E3/uL   Neutrophils 65 Not Estab. %   Lymphs 25 Not Estab. %   Monocytes 8 Not Estab. %   Eos 2 Not Estab. %   Basos 0 Not Estab. %   Neutrophils Absolute 3.2 1.4 - 7.0 x10E3/uL   Lymphocytes Absolute 1.2 0.7 - 3.1 x10E3/uL   Monocytes Absolute 0.4 0.1 - 0.9 x10E3/uL   EOS (ABSOLUTE) 0.1 0.0 - 0.4 x10E3/uL   Basophils Absolute 0.0 0.0 - 0.2 x10E3/uL   Immature Granulocytes 0 Not  Estab. %   Immature Grans (Abs) 0.0 0.0 - 0.1 x10E3/uL  Lipid panel     Status: Abnormal   Collection Time: 07/05/19  9:16 AM  Result Value Ref Range   Cholesterol, Total 151 100 - 199 mg/dL   Triglycerides 162 (H) 0 - 149 mg/dL   HDL 40 >39 mg/dL   VLDL Cholesterol Cal 32 5 - 40 mg/dL   LDL Calculated 79 0 - 99 mg/dL   Chol/HDL Ratio 3.8 0.0 - 5.0 ratio    Comment:                                   T. Chol/HDL Ratio                                             Men  Women                               1/2 Avg.Risk  3.4    3.3                                   Avg.Risk  5.0    4.4                                2X Avg.Risk  9.6    7.1                                3X Avg.Risk 23.4   11.0   T4, free     Status: None   Collection Time: 07/05/19  9:16 AM  Result Value Ref Range   Free T4 1.23 0.82 - 1.77 ng/dL  TSH     Status: None   Collection Time: 07/05/19  9:16 AM  Result Value Ref Range   TSH 2.700 0.450 - 4.500 uIU/mL  VITAMIN D 25 Hydroxy (Vit-D Deficiency, Fractures)     Status: None   Collection Time: 07/05/19  9:16 AM  Result Value Ref Range   Vit D, 25-Hydroxy 36.7 30.0 - 100.0 ng/mL  Comment: Vitamin D deficiency has been defined by the [place] practice guideline as a level of serum 25-OH vitamin D less than 20 ng/mL (1,2). The Endocrine Society went on to further define vitamin D insufficiency as a level between 21 and 29 ng/mL (2). 1. IOM (Institute of Medicine). 2010. Dietary reference    intakes for calcium and D. [place]: The    [employer]. 2. Holick MF, Binkley [place], Bischoff-Ferrari HA, et al.    Evaluation, treatment, and prevention of vitamin D    deficiency: an Endocrine Society clinical practice    guideline. JCEM. 2011 Jul; 96(7):1911-30.   Vitamin B12     Status: None   Collection Time: 07/05/19  9:16 AM  Result Value Ref Range   Vitamin B-12 517 232 - 1,245 pg/mL  Hemoglobin A1c     Status:  Abnormal   Collection Time: 07/05/19  9:22 AM  Result Value Ref Range   Hgb A1c MFr Bld 6.9 (H) 4.8 - 5.6 %    Comment:          Prediabetes: 5.7 - 6.4          Diabetes: >6.4          Glycemic control for adults with diabetes: <7.0    Est. average glucose Bld gHb Est-mCnc 151 mg/dL     No problems updated.    Patient Care Team    Relationship Specialty Notifications Start End  Mellody Dance, DO PCP - General Family Medicine  04/08/17   Gatha Mayer, MD Consulting Physician Gastroenterology  09/28/11   Darleen Crocker, MD  Ophthalmology  09/16/12   Melrose Nakayama, MD  Orthopedic Surgery  12/05/13   Kathie Rhodes, MD  Urology  07/30/15   Katy Apo, MD Consulting Physician Ophthalmology  04/08/17   Arlyss Gandy, PA-C  Dermatology  09/13/17      Patient Active Problem List   Diagnosis Date Noted   stage 3 chronic kidney disease due to type 2 diabetes mellitus ([place]) 02/22/2017    Priority: High   Diabetes mellitus type 2, controlled ([place]) 01/05/2011    Priority: High   Hyperlipidemia associated with type 2 diabetes mellitus ([place]) 01/05/2011    Priority: High   Hypertension associated with diabetes ([place]) 01/05/2011    Priority: High   GERD (gastroesophageal reflux disease) 09/12/2017    Priority: Medium   Urinary retention 07/30/2015    Priority: Medium   Personal history of colonic adenomas 01/16/2013    Priority: Medium   Hypomagnesemia 04/08/2016    Priority: Low   B12 deficiency 01/30/2016    Priority: Low   Pernicious anemia 02/22/2012    Priority: Low   Anemia     Priority: Low   Osteoarthritis 01/05/2011    Priority: Low   Marital conflict- wife not taking care of herself 08/15/2019   Caregiver stress 08/15/2019   High risk medication use 09/26/2018   Vitamin D deficiency 09/12/2017   Ruptured, tendon, Achilles, right, sequela 04/08/2017   Elevated PSA 08/25/2016   Primary osteoarthritis of right knee 07/09/2015      Past Medical History:  Diagnosis Date   Achilles tendinitis    Allergy    Cataract    Chronic kidney disease    Complication of anesthesia    urinary retention   Degenerative tear of left medial meniscus 11/2014   Diabetes mellitus, type 2 (HCC)    Dyslipidemia    GERD (gastroesophageal reflux disease)  Heart murmur    child   Hyperlipidemia    Hypertension    Osteoarthritis    right knee   Personal history of colonic adenomas 01/16/2013   Plantar fasciitis      Past Surgical History:  Procedure Laterality Date   CATARACT EXTRACTION  08/2012 L, 09/2012 R   COLONOSCOPY     TONSILLECTOMY  1945   TONSILLECTOMY     TOTAL HIP ARTHROPLASTY  2011   Left   TOTAL HIP ARTHROPLASTY Right 07/09/2015   Procedure: TOTAL HIP ARTHROPLASTY ANTERIOR APPROACH;  Surgeon: Melrose Nakayama, MD;  Location: [place];  Service: Orthopedics;  Laterality: Right;   TOTAL KNEE ARTHROPLASTY Right 07/21/2016   Procedure: RIGHT TOTAL KNEE ARTHROPLASTY;  Surgeon: Melrose Nakayama, MD;  Location: [place];  Service: Orthopedics;  Laterality: Right;     Family History  Problem Relation Age of Onset   Ovarian cancer Mother    Hypertension Father    Diabetes Maternal Grandfather    Pneumonia Paternal Grandfather    Colon cancer Neg Hx    Esophageal cancer Neg Hx    Liver cancer Neg Hx    Pancreatic cancer Neg Hx    Rectal cancer Neg Hx    Stomach cancer Neg Hx      Social History   Substance and Sexual Activity  Drug Use No  ,  Social History   Substance and Sexual Activity  Alcohol Use Yes   Alcohol/week: 1.0 - 2.0 standard drinks   Types: 1 - 2 Glasses of wine per week  ,  Social History   Tobacco Use  Smoking Status Former Smoker   Packs/day: 1.00   Years: 20.00   Pack years: 20.00   Quit date: 11/16/1986   Years since quitting: 32.7  Smokeless Tobacco Never Used  ,    Current Outpatient Medications on File Prior to Visit  Medication Sig  Dispense Refill   blood glucose meter kit and supplies KIT Use to test blood sugar up to twice a day. DX E11.09 1 each 0   Cholecalciferol (VITAMIN D) 2000 units CAPS Take 1 capsule (2,000 Units total) by mouth every morning. 90 capsule 3   Cyanocobalamin (VITAMIN B12 PO) Take 1 tablet by mouth daily.     GLUCOSAMINE SULFATE PO Take 1 tablet by mouth daily.     glucose blood test strip Use as instructed to test blood sugar up to twice a day. DX:  E11.09 200 each 3   Krill Oil 300 MG CAPS Take 500 mg by mouth daily.      Lancets 30Z MISC Delica Fine Lancets. Use to test blood sugar up to twice a day. DX E11.09 200 each 3   loratadine (CLARITIN) 10 MG tablet Take 1 tablet (10 mg total) by mouth daily. (Patient taking differently: Take 10 mg by mouth daily as needed for allergies. ) 90 tablet 3   magnesium oxide (MAG-OX) 400 MG tablet Take 1 tablet (400 mg total) by mouth 3 (three) times daily. 360 tablet 3   metFORMIN (GLUCOPHAGE) 500 MG tablet Take 2 tablets (1,000 mg total) by mouth 2 (two) times daily with a meal. 360 tablet 0   Multiple Vitamins-Minerals (SENIOR MULTIVITAMIN PLUS) TABS Take 1 tablet by mouth daily.       omeprazole (PRILOSEC) 20 MG capsule Take 1 capsule (20 mg total) by mouth at bedtime. (Patient taking differently: Take 20 mg by mouth every other day. ) 90 capsule 3   tamsulosin (FLOMAX) 0.4 MG  CAPS capsule Take 1 capsule (0.4 mg total) by mouth at bedtime. (Patient taking differently: Take 0.4-0.8 mg by mouth See admin instructions. Take 0.4 mg by mouth daily. Starting 7 days before procedure take 0.51m by mouth daily) 14 capsule 0   Triamcinolone Acetonide (NASACORT AQ NA) Place 1 spray into the nose daily as needed (allergies).     Current Facility-Administered Medications on File Prior to Visit  Medication Dose Route Frequency Provider Last Rate Last Dose   0.9 %  sodium chloride infusion  500 mL Intravenous Once GGatha Mayer MD         Allergies   Allergen Reactions   Nsaids     Kidney Disease   Sulfa Antibiotics Other (See Comments)    As child    Not sure rxn   Thiazide-Type Diuretics Other (See Comments)    Unknown     Review of Systems:   General:  Denies fever, chills Optho/Auditory:   Denies visual changes, blurred vision Respiratory:   Denies SOB, cough, wheeze, DIB  Cardiovascular:   Denies chest pain, palpitations, painful respirations Gastrointestinal:   Denies nausea, vomiting, diarrhea.  Endocrine:     Denies new hot or cold intolerance Musculoskeletal:  Denies joint swelling, gait issues, or new unexplained myalgias/ arthralgias Skin:  Denies rash, suspicious lesions  Neurological:    Denies dizziness, unexplained weakness, numbness  Psychiatric/Behavioral:   Denies mood changes    Objective:     Blood pressure 116/67, pulse 70, temperature 98.4 F (36.9 C), temperature source Oral, weight 193 lb 14.4 oz (88 kg), SpO2 97 %.  Body mass index is 26.3 kg/m.  General: Well Developed, well nourished, and in no acute distress.  HEENT: Normocephalic, atraumatic, pupils equal round reactive to light, neck supple, No carotid bruits, no JVD Skin: Warm and dry, cap RF less 2 sec Cardiac: Regular rate and rhythm, S1, S2 WNL's, no murmurs rubs or gallops Respiratory: ECTA B/L, Not using accessory muscles, speaking in full sentences. NeuroM-Sk: Ambulates w/o assistance, moves ext * 4 w/o difficulty, sensation grossly intact.  Ext: scant edema b/l lower ext Psych: No HI/SI, judgement and insight good, Euthymic mood. Full Affect.

## 2019-09-30 ENCOUNTER — Encounter: Payer: Self-pay | Admitting: Family Medicine

## 2019-09-30 DIAGNOSIS — R197 Diarrhea, unspecified: Secondary | ICD-10-CM

## 2019-09-30 DIAGNOSIS — R1084 Generalized abdominal pain: Secondary | ICD-10-CM

## 2019-10-02 NOTE — Telephone Encounter (Signed)
Spoke with Patient and he does not currently have a gastro doc. Referral placed for patient. He is aware of this and verbalized understanding with no additional questions. AS, CMA

## 2019-10-18 ENCOUNTER — Ambulatory Visit: Payer: Medicare Other | Admitting: Gastroenterology

## 2019-10-25 ENCOUNTER — Ambulatory Visit (INDEPENDENT_AMBULATORY_CARE_PROVIDER_SITE_OTHER): Payer: Medicare Other | Admitting: Gastroenterology

## 2019-10-25 ENCOUNTER — Encounter: Payer: Self-pay | Admitting: Gastroenterology

## 2019-10-25 VITALS — BP 102/58 | HR 73 | Temp 97.6°F | Ht 72.0 in | Wt 199.0 lb

## 2019-10-25 DIAGNOSIS — R195 Other fecal abnormalities: Secondary | ICD-10-CM | POA: Insufficient documentation

## 2019-10-25 NOTE — Patient Instructions (Signed)
Continue your daily probiotic. Imodium or Pepto as needed. Monitor diet or keep a food diary.

## 2019-10-25 NOTE — Progress Notes (Signed)
10/25/2019 [name] [id number] 05-Sep-1941   HISTORY OF PRESENT ILLNESS: This is a very pleasant 78 year old male who is a patient of Dr. Celesta Aver.  He is here today with complaints of intermittent loose stools.  He says that the initial episode occurred in March, then had another episode in April, then not another episode until October.  He says that when they occur the day that it begins he will have 3 or 4 loose stools in the day associated with abdominal cramping.  Then the consistency of the stools just remains loose although some days he does not even have bowel movements.  The most inconvenient part of it is that he gets urgency with these episodes of loose stools, which has been the main issue.  He denies seeing blood in his stools.  He is on metformin and magnesium, but has been on those for years.  He cannot identify anything in particular his diet that could be driving the symptoms.  He does admit that he is under a lot of stress with some issues with his wife's health, etc.  He tells me that he has started a probiotic about 4 weeks ago and has been eating more of a bland diet now.  Usually uses Pepto-Bismol as needed.  He denies any nausea, vomiting, fever, chills, blood in his stool.  Last colonoscopy was in March 2019 that was completely normal.   Past Medical History:  Diagnosis Date  . Achilles tendinitis   . Allergy   . Cataract   . Chronic kidney disease   . Complication of anesthesia    urinary retention  . 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 Diabetes mellitus, type 2 ([place])   . Dyslipidemia   . GERD (gastroesophageal reflux disease)   . Heart murmur    child  . Hyperlipidemia   . Hypertension   . Osteoarthritis    right knee  . Personal history of colonic adenomas 01/16/2013  . Plantar fasciitis    Past Surgical History:  Procedure Laterality Date  . CATARACT EXTRACTION  08/2012 L, 09/2012 R  . COLONOSCOPY    . TONSILLECTOMY  1945  .  TONSILLECTOMY    . TOTAL HIP ARTHROPLASTY  2011   Left  . TOTAL HIP ARTHROPLASTY Right 07/09/2015   Procedure: TOTAL HIP ARTHROPLASTY ANTERIOR APPROACH;  Surgeon: Melrose Nakayama, MD;  Location: [place];  Service: Orthopedics;  Laterality: Right;  . TOTAL KNEE ARTHROPLASTY Right 07/21/2016   Procedure: RIGHT TOTAL KNEE ARTHROPLASTY;  Surgeon: Melrose Nakayama, MD;  Location: [place];  Service: Orthopedics;  Laterality: Right;    reports that he quit smoking about 32 years ago. He has a 20.00 pack-year smoking history. He has never used smokeless tobacco. He reports current alcohol use of about 1.0 - 2.0 standard drinks of alcohol per week. He reports that he does not use drugs. family history includes Diabetes in his maternal grandfather; Hypertension in his father; Ovarian cancer in his mother; Pneumonia in his paternal grandfather. Allergies  Allergen Reactions  . Nsaids     Kidney Disease  . Sulfa Antibiotics Other (See Comments)    As child    Not sure rxn  . Thiazide-Type Diuretics Other (See Comments)    Unknown      Outpatient Encounter Medications as of 10/25/2019  Medication Sig  . amLODipine (NORVASC) 10 MG tablet Take 1 tablet (10 mg total) by mouth daily.  Marland Kitchen atorvastatin (LIPITOR) 20 MG tablet TAKE 1  TABLET BY MOUTH EVERYDAY AT BEDTIME  . blood glucose meter kit and supplies KIT Use to test blood sugar up to twice a day. DX E11.09  . Cholecalciferol (VITAMIN D) 2000 units CAPS Take 1 capsule (2,000 Units total) by mouth every morning.  . Cyanocobalamin (VITAMIN B12 PO) Take 1 tablet by mouth daily.  Marland Kitchen GLUCOSAMINE SULFATE PO Take 1 tablet by mouth daily.  Marland Kitchen glucose blood test strip Use as instructed to test blood sugar up to twice a day. DX:  E11.09  . Krill Oil 300 MG CAPS Take 500 mg by mouth daily.   . Lancets 74F MISC Delica Fine Lancets. Use to test blood sugar up to twice a day. DX E11.09  . lisinopril (ZESTRIL) 20 MG tablet Take 1 tablet (20 mg total) by mouth daily.  Marland Kitchen  loratadine (CLARITIN) 10 MG tablet Take 1 tablet (10 mg total) by mouth daily. (Patient taking differently: Take 10 mg by mouth daily as needed for allergies. )  . magnesium oxide (MAG-OX) 400 MG tablet Take 1 tablet (400 mg total) by mouth 3 (three) times daily.  . metFORMIN (GLUCOPHAGE) 500 MG tablet Take 2 tablets (1,000 mg total) by mouth 2 (two) times daily with a meal.  . Multiple Vitamins-Minerals (SENIOR MULTIVITAMIN PLUS) TABS Take 1 tablet by mouth daily.    Marland Kitchen omeprazole (PRILOSEC) 20 MG capsule Take 1 capsule (20 mg total) by mouth at bedtime. (Patient taking differently: Take 20 mg by mouth every other day. )  . tamsulosin (FLOMAX) 0.4 MG CAPS capsule Take 1 capsule (0.4 mg total) by mouth at bedtime. (Patient taking differently: Take 0.4-0.8 mg by mouth See admin instructions. Take 0.4 mg by mouth daily. Starting 7 days before procedure take 0.47m by mouth daily)  . Triamcinolone Acetonide (NASACORT AQ NA) Place 1 spray into the nose daily as needed (allergies).   Facility-Administered Encounter Medications as of 10/25/2019  Medication  . 0.9 %  sodium chloride infusion     REVIEW OF SYSTEMS  : All other systems reviewed and negative except where noted in the History of Present Illness.   PHYSICAL EXAM: BP (!) 102/58   Pulse 73   Temp 97.6 F (36.4 C)   Ht 6' (1.829 m)   Wt 199 lb (90.3 kg)   BMI 26.99 kg/m  General: Well developed white male in no acute distress Head: Normocephalic and atraumatic Eyes:  Sclerae anicteric, conjunctiva pink. Ears: Normal auditory acuity Lungs: Clear throughout to auscultation; no increased WOB. Heart: Regular rate and rhythm; no M/R/G. Abdomen: Soft, non-distended.  BS present.  Non-tender. Musculoskeletal: Symmetrical with no gross deformities  Skin: No lesions on visible extremities Extremities: No edema  Neurological: Alert oriented x 4, grossly non-focal Psychological:  Alert and cooperative. Normal mood and affect  ASSESSMENT  AND PLAN: *78year old male with complaints of episodes of loose stools.  Had an episode in March, an episode in April, and then not another episode until October.  Initially this is associated with abdominal cramping and 3-4 episodes of diarrhea in a day, but then stools just continue with loose consistency, although not even occurring on a daily basis, for several weeks.  I am unsure what would really be causing this other than possibly some irritable bowel type issue.  He is having some marital problems/issues with his wife's health.  Certainly should not be anything dietary that he would be eating consistently as you would expect more regular/consistent symptoms.  Same thing with medications.  He is  on metformin and magnesium so those were considered, once again symptoms not occurring regularly.  Has normal bowel movements between episodes.  TSH is normal in August.  Colonoscopy is up-to-date, had one in March 2019.  I doubt that this is something infectious as 3 times in a year would be quite excessive.  Nonetheless, I think probiotics are a good idea, which he has started.  He will continue those.  Can use Imodium or Pepto-Bismol on an as-needed basis.  I encouraged him to keep a food diary around the times that he has episodes or the symptoms occur to see if we can identify any particular triggers.  Could consider using a daily powder fiber supplement such as Benefiber to help bulk stools.   CC:  Mellody Dance, DO

## 2019-11-09 ENCOUNTER — Other Ambulatory Visit: Payer: Self-pay | Admitting: Family Medicine

## 2019-11-29 ENCOUNTER — Other Ambulatory Visit: Payer: Self-pay

## 2019-11-29 ENCOUNTER — Encounter: Payer: Self-pay | Admitting: Family Medicine

## 2019-11-29 ENCOUNTER — Ambulatory Visit (INDEPENDENT_AMBULATORY_CARE_PROVIDER_SITE_OTHER): Payer: Medicare Other | Admitting: Family Medicine

## 2019-11-29 VITALS — BP 125/78 | HR 71 | Temp 97.8°F | Resp 12 | Ht 72.0 in | Wt 199.4 lb

## 2019-11-29 DIAGNOSIS — E785 Hyperlipidemia, unspecified: Secondary | ICD-10-CM

## 2019-11-29 DIAGNOSIS — I152 Hypertension secondary to endocrine disorders: Secondary | ICD-10-CM

## 2019-11-29 DIAGNOSIS — I10 Essential (primary) hypertension: Secondary | ICD-10-CM

## 2019-11-29 DIAGNOSIS — Z630 Problems in relationship with spouse or partner: Secondary | ICD-10-CM | POA: Diagnosis not present

## 2019-11-29 DIAGNOSIS — E1159 Type 2 diabetes mellitus with other circulatory complications: Secondary | ICD-10-CM

## 2019-11-29 DIAGNOSIS — E1129 Type 2 diabetes mellitus with other diabetic kidney complication: Secondary | ICD-10-CM | POA: Diagnosis not present

## 2019-11-29 DIAGNOSIS — Z636 Dependent relative needing care at home: Secondary | ICD-10-CM

## 2019-11-29 DIAGNOSIS — E1169 Type 2 diabetes mellitus with other specified complication: Secondary | ICD-10-CM

## 2019-11-29 LAB — POCT UA - MICROALBUMIN
Albumin/Creatinine Ratio, Urine, POC: <30
Creatinine, POC: 200 mg/dL
Microalbumin Ur, POC: 30 mg/L

## 2019-11-29 LAB — POCT GLYCOSYLATED HEMOGLOBIN (HGB A1C): Hemoglobin A1C: 6.7 % — AB (ref 4.0–5.6)

## 2019-11-29 NOTE — Progress Notes (Signed)
Impression and Recommendations:    1. 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 Hypertension associated with diabetes ([place])   3. Hyperlipidemia associated with type 2 diabetes mellitus ([place])   4. Marital conflict- wife not taking care of herself   5. 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 - A1c drawn today down to 6.7, at goal under 7.0, stable.  - Urine drawn today to assess for microalbuminuria. - Urine microalbuminuria within normal limits.  - Pt will continue current treatment regimen, two tablets of metformin twice daily.  - Counseled patient on pathophysiology of disease and discussed various treatment options, which always includes dietary and lifestyle modification as first line.    - Importance of low carb, heart-healthy diet discussed with patient in addition to regular aerobic exercise of 30mn 5d/week or more.   - Check FBS and 2 hours after the biggest meal of your day.  Keep log and bring in next OV for my review.     - Also told patient if you ever feel poorly, please check your blood pressure and blood sugar, as one or the other could be the cause of your symptoms.  - Pt reminded about need for yearly eye and foot exams.  Told patient to make appt.for diabetic eye exam, CMAs here will do foot exams  - We will continue to monitor.  Hypertension associated with DM - Blood pressure is elevated in office today. - Discussed goal blood pressure for a  diabetic patient his age.  - Per patient, not checking BP at home.  Ambulatory blood pressure monitoring encouraged at least 3 times weekly.  Keep log and bring in every office visit.  Reminded patient that if they ever feel poorly in any way, to check their blood pressure and pulse.  - If BP remains elevated above our goal of 140/90, patient will return sooner for f/up and additional management.  - Patient will continue current treatment regimen.  - Counseled patient on pathophysiology of disease and discussed various treatment options, which always includes dietary and lifestyle modification as first line.   - Lifestyle changes such as dash and heart healthy diets and engaging in a regular exercise program discussed extensively with patient.   - We will continue to monitor.  Health Counseling & Preventative Maintenance - Advised patient to continue working toward exercising to improve overall mental, physical, and emotional health.    - To help preserve memory, encouraged patient to increase exercise and engage in activities that build new pathways in the brain, such as learning a new language, learning a new instrument, word seekers, crossword puzzles, etc.  - Reviewed the "spokes of the wheel" of mood and health management.  Stressed the importance of ongoing prudent habits, including regular exercise, appropriate sleep hygiene, healthful dietary habits, and prayer/meditation to relax.  - Encouraged patient to engage in daily physical activity, especially a formal exercise routine.  Recommended that the patient eventually strive for at least 150 minutes of moderate cardiovascular activity  per week according to guidelines established by the [facility].   - Healthy dietary habits encouraged, including low-carb, high antioxidant, and high amounts of lean protein in diet.   - Patient should also consume adequate amounts of water.  - Health counseling performed.  All questions  answered.  COVID-19 Counseling - To schedule vaccination, advised patient to consult websites through [facility] and [place] Department of Health as advised.  Encouraged patient to seek information on the [place] Department of Health website, and [facility] website.  - Novel Covid -19 counseling done; all questions were answered.   - Current CDC / federal and  guidelines reviewed with patient  - Reminded pt of extreme importance of social distancing; wearing a mask when out in public; insensate handwashing and cleaning of surfaces, avoiding unnecessary trips for shopping and avoiding ALL but emergency appts etc. - Told patient to be prepared, not scared; and be smart for the sake of others - Patient will call with any additional concerns  Recommendations - If BP remains elevated above our goal of 140/90, patient will return sooner for f/up and modification of treatment plan.  Patient agrees to check BP and keep a BP log at home.   Orders Placed This Encounter  Procedures  . POC Hemoglobin A1c (dx code Z13.1)  . POCT UA - Microalbumin    Gross side effects, risk and benefits, and alternatives of medications and treatment plan in general discussed with patient.  Patient is aware that all medications have potential side effects and we are unable to predict every side effect or drug-drug interaction that may occur.   Patient will call with any questions prior to using medication if they have concerns.    Expresses verbal understanding and consents to current therapy and treatment regimen.  No barriers to understanding were identified.  Red flag symptoms and signs discussed in detail.  Patient expressed understanding regarding what to do in case of emergency\urgent symptoms  Please see AVS handed out to patient at the end of our visit for further patient instructions/ counseling done pertaining to today's office visit.   Return for f/up 3 months, sooner if BP remains elevated above  goal at home.     Note:  This note was prepared with assistance of Dragon voice recognition software. Occasional wrong-word or sound-a-like substitutions may have occurred due to the inherent limitations of voice recognition software.  This document serves as a record of services personally performed by Mellody Dance, DO. It was created on her behalf by Toni Amend, a trained medical scribe. The creation of this record is based on the scribe's personal observations and the provider's statements to them.   This case required medical decision making of at least moderate complexity. The above documentation has been reviewed to be accurate and was completed by Marjory Sneddon, D.O.       --------------------------------------------------------------------------------------------------------------------------------------------------------------------------------------------------------------------------------------------    Subjective:    Nathan Gomez, am serving as scribe for Dr. Mellody Dance.  HPI: Nathan Gomez is a 79 y.o. male who presents to [facility] today for issues as discussed below.  States he tries to exercise each day; "I do stretching and a lot of motions for my hip and knees, and then I have a little bicycle I use."  Notes he has been having significant difficulty with scheduling his COVID-19 vaccine.  HPI:   Diabetes Mellitus:  Home glucose readings: 120-125, sometimes 128, "running about the way that  it has in the past."  He does not monitor his postprandial blood sugar readings.   - Patient reports good compliance with therapy plan: medication and/or lifestyle modification.  Continues metformin as prescribed, two tablets in the morning and two tablets in the evening.  Notes he recently went for his diabetic eye exam.  - His denies acute concerns or problems related to treatment plan  - He denies new concerns.   Denies polyuria/polydipsia, hypo/ hyperglycemia symptoms.  Denies new onset of: chest pain, exercise intolerance, shortness of breath, dizziness, visual changes, headache, lower extremity swelling or claudication.   Last A1C in the office was:  Lab Results  Component Value Date   HGBA1C 6.7 (A) 11/29/2019   HGBA1C 6.9 (H) 07/05/2019   HGBA1C 6.9 (H) 07/05/2019   Lab Results  Component Value Date   MICROALBUR 30 11/29/2019   LDLCALC 79 07/05/2019   CREATININE 1.53 (H) 07/05/2019   BP Readings from Last 3 Encounters:  11/29/19 125/78  10/25/19 (!) 102/58  08/15/19 116/67   Wt Readings from Last 3 Encounters:  11/29/19 199 lb 6.4 oz (90.4 kg)  10/25/19 199 lb (90.3 kg)  08/15/19 193 lb 14.4 oz (88 kg)    HPI:  Hypertension:  -  His blood pressure at home has been running: Is not checking his blood pressure at home; states he "feels good."  - Patient reports good compliance with medication and/or lifestyle modification  - His denies acute concerns or problems related to treatment plan  - He denies new onset of: chest pain, exercise intolerance, shortness of breath, dizziness, visual changes, headache, lower extremity swelling or claudication.   Last 3 blood pressure readings in our office are as follows: BP Readings from Last 3 Encounters:  11/29/19 125/78  10/25/19 (!) 102/58  08/15/19 116/67   Filed Weights   11/29/19 0842  Weight: 199 lb 6.4 oz (90.4 kg)         Wt Readings from Last 3 Encounters:  11/29/19 199 lb 6.4 oz (90.4 kg)  10/25/19 199 lb (90.3 kg)  08/15/19 193 lb 14.4 oz (88 kg)   BP Readings from Last 3 Encounters:  11/29/19 125/78  10/25/19 (!) 102/58  08/15/19 116/67   Pulse Readings from Last 3 Encounters:  11/29/19 71  10/25/19 73  08/15/19 70   BMI Readings from Last 3 Encounters:  11/29/19 27.04 kg/m  10/25/19 26.99 kg/m  08/15/19 26.30 kg/m     Patient Care Team    Relationship Specialty Notifications Start End   Mellody Dance, DO PCP - General Family Medicine  04/08/17   Gatha Mayer, MD Consulting Physician Gastroenterology  09/28/11   Darleen Crocker, MD  Ophthalmology  09/16/12   Melrose Nakayama, MD  Orthopedic Surgery  12/05/13   Kathie Rhodes, MD  Urology  07/30/15   Katy Apo, MD Consulting Physician Ophthalmology  04/08/17   Arlyss Gandy, PA-C  Dermatology  09/13/17      Patient Active Problem List   Diagnosis Date Noted  . stage 3 chronic kidney disease due to type 2 diabetes mellitus ([place]) 02/22/2017  . Diabetes mellitus type 2, controlled ([place]) 01/05/2011  . Hyperlipidemia associated with type 2 diabetes mellitus ([place]) 01/05/2011  . Hypertension associated with diabetes ([place]) 01/05/2011  . GERD (gastroesophageal reflux disease) 09/12/2017  . Urinary retention 07/30/2015  . Personal history of colonic adenomas 01/16/2013  . Hypomagnesemia 04/08/2016  . B12 deficiency 01/30/2016  . Pernicious anemia 02/22/2012  . Anemia   . Osteoarthritis  01/05/2011  . Loose stools 10/25/2019  . Marital conflict- wife not taking care of herself 08/15/2019  . Caregiver stress 08/15/2019  . High risk medication use 09/26/2018  . Vitamin D deficiency 09/12/2017  . Ruptured, tendon, Achilles, right, sequela 04/08/2017  . Elevated PSA 08/25/2016  . Primary osteoarthritis of right knee 07/09/2015    Past Medical history, Surgical history, Family history, Social history, Allergies and Medications have been entered into the medical record, reviewed and changed as needed.    Current Meds  Medication Sig  . amLODipine (NORVASC) 10 MG tablet Take 1 tablet (10 mg total) by mouth daily.  Marland Kitchen atorvastatin (LIPITOR) 20 MG tablet TAKE 1 TABLET BY MOUTH EVERYDAY AT BEDTIME  . blood glucose meter kit and supplies KIT Use to test blood sugar up to twice a day. DX E11.09  . Cholecalciferol (VITAMIN D) 2000 units CAPS Take 1 capsule (2,000 Units total) by mouth every morning.  . Cyanocobalamin  (VITAMIN B12 PO) Take 1 tablet by mouth daily.  Marland Kitchen GLUCOSAMINE SULFATE PO Take 1 tablet by mouth daily.  Marland Kitchen glucose blood test strip Use as instructed to test blood sugar up to twice a day. DX:  E11.09  . Krill Oil 300 MG CAPS Take 500 mg by mouth daily.   . Lancets 71G MISC Delica Fine Lancets. Use to test blood sugar up to twice a day. DX E11.09  . lisinopril (ZESTRIL) 20 MG tablet Take 1 tablet (20 mg total) by mouth daily.  Marland Kitchen loratadine (CLARITIN) 10 MG tablet Take 1 tablet (10 mg total) by mouth daily. (Patient taking differently: Take 10 mg by mouth daily as needed for allergies. )  . magnesium oxide (MAG-OX) 400 MG tablet Take 1 tablet (400 mg total) by mouth 3 (three) times daily.  . metFORMIN (GLUCOPHAGE) 500 MG tablet TAKE 2 TABLETS TWO TIMES A DAY WITH MEALS.  . Multiple Vitamins-Minerals (SENIOR MULTIVITAMIN PLUS) TABS Take 1 tablet by mouth daily.    Marland Kitchen omeprazole (PRILOSEC) 20 MG capsule Take 1 capsule (20 mg total) by mouth at bedtime. (Patient taking differently: Take 20 mg by mouth every other day. )  . Probiotic Product (PROBIOTIC DAILY PO) Take by mouth.  . tamsulosin (FLOMAX) 0.4 MG CAPS capsule Take 1 capsule (0.4 mg total) by mouth at bedtime. (Patient taking differently: Take 0.4-0.8 mg by mouth See admin instructions. Take 0.4 mg by mouth daily. Starting 7 days before procedure take 0.51m by mouth daily)  . Triamcinolone Acetonide (NASACORT AQ NA) Place 1 spray into the nose daily as needed (allergies).   Current Facility-Administered Medications for the 11/29/19 encounter (Office Visit) with OMellody Dance DO  Medication  . 0.9 %  sodium chloride infusion    Allergies:  Allergies  Allergen Reactions  . Nsaids     Kidney Disease  . Sulfa Antibiotics Other (See Comments)    As child    Not sure rxn  . Thiazide-Type Diuretics Other (See Comments)    Unknown     Review of Systems:  A fourteen system review of systems was performed and found to be positive as per  HPI.   Objective:   Blood pressure 125/78, pulse 71, temperature 97.8 F (36.6 C), temperature source Oral, resp. rate 12, height 6' (1.829 m), weight 199 lb 6.4 oz (90.4 kg), SpO2 97 %. Body mass index is 27.04 kg/m. General:  Well Developed, well nourished, appropriate for stated age.  Neuro:  Alert and oriented,  extra-ocular muscles intact  HEENT:  Normocephalic, atraumatic, neck supple, no carotid bruits appreciated  Skin:  no gross rash, warm, pink. Cardiac:  RRR, S1 S2 Respiratory:  ECTA B/L and A/P, Not using accessory muscles, speaking in full sentences- unlabored. Vascular:  Ext warm, no cyanosis apprec.; cap RF less 2 sec. Psych:  No HI/SI, judgement and insight good, Euthymic mood. Full Affect.

## 2019-11-29 NOTE — Patient Instructions (Signed)
Recent Results (from the past 2160 hour(s))  POCT UA - Microalbumin     Status: None   Collection Time: 11/29/19  8:56 AM  Result Value Ref Range   Microalbumin Ur, POC 30 mg/L   Creatinine, POC 200 mg/dL   Albumin/Creatinine Ratio, Urine, POC <30   POC Hemoglobin A1c (dx code Z13.1)     Status: Abnormal   Collection Time: 11/29/19  9:00 AM  Result Value Ref Range   Hemoglobin A1C 6.7 (A) 4.0 - 5.6 %   HbA1c POC (<> result, manual entry)     HbA1c, POC (prediabetic range)     HbA1c, POC (controlled diabetic range)     -Jdyn please check your blood pressure and blood sugars at least 3 days a week.  Keep a notebook and write down these values to bring in each office visit.  Please follow-up sooner than planned if your blood pressure remains above goal of 140/90 or less.    Diabetes Mellitus and Standards of Medical Care  Managing diabetes (diabetes mellitus) can be complicated. Your diabetes treatment may be managed by a team of health care providers, including:  A diet and nutrition specialist (registered dietitian).  A nurse.  A certified diabetes educator (CDE).  A diabetes specialist (endocrinologist).  An eye doctor.  A primary care provider.  A dentist.  Your health care providers follow a schedule in order to help you get the best quality of care. The following schedule is a general guideline for your diabetes management plan. Your health care providers may also give you more specific instructions.  HbA1c (hemoglobin A1c) test This test provides information about blood sugar (glucose) control over the previous 2-3 months. It is used to check whether your diabetes management plan needs to be adjusted.  If you are meeting your treatment goals, this test is done at least 2 times a year.  If you are not meeting treatment goals or if your treatment goals have changed, this test is done 4 times a year.  Blood pressure test  This test is done at every routine medical  visit. For most people, the goal is less than 130/80. Ask your health care provider what your goal blood pressure should be.  Dental and eye exams  Visit your dentist two times a year.  If you have type 1 diabetes, get an eye exam 3-5 years after you are diagnosed, and then once a year after your first exam. ? If you were diagnosed with type 1 diabetes as a child, get an eye exam when you are age 81 or older and have had diabetes for 3-5 years. After the first exam, you should get an eye exam once a year.  If you have type 2 diabetes, have an eye exam as soon as you are diagnosed, and then once a year after your first exam.  Foot care exam  Visual foot exams are done at every routine medical visit. The exams check for cuts, bruises, redness, blisters, sores, or other problems with the feet.  A complete foot exam is done by your health care provider once a year. This exam includes an inspection of the structure and skin of your feet, and a check of the pulses and sensation in your feet. ? Type 1 diabetes: Get your first exam 3-5 years after diagnosis. ? Type 2 diabetes: Get your first exam as soon as you are diagnosed.  Check your feet every day for cuts, bruises, redness, blisters, or sores. If you  have any of these or other problems that are not healing, contact your health care provider.  Kidney function test (urine microalbumin)  This test is done once a year. ? Type 1 diabetes: Get your first test 5 years after diagnosis. ? Type 2 diabetes: Get your first test as soon as you are diagnosed._  If you have chronic kidney disease (CKD), get a serum creatinine and estimated glomerular filtration rate (eGFR) test once a year.  Lipid profile (cholesterol, HDL, LDL, triglycerides)  This test should be done when you are diagnosed with diabetes, and every 5 years after the first test. If you are on medicines to lower your cholesterol, you may need to get this test done every year. ? The  goal for LDL is less than 100 mg/dL (5.5 mmol/L). If you are at high risk, the goal is less than 70 mg/dL (3.9 mmol/L). ? The goal for HDL is 40 mg/dL (2.2 mmol/L) for men and 50 mg/dL(2.8 mmol/L) for women. An HDL cholesterol of 60 mg/dL (3.3 mmol/L) or higher gives some protection against heart disease. ? The goal for triglycerides is less than 150 mg/dL (8.3 mmol/L).  Immunizations  The yearly flu (influenza) vaccine is recommended for everyone 6 months or older who has diabetes.  The pneumonia (pneumococcal) vaccine is recommended for everyone 2 years or older who has diabetes. If you are 81 or older, you may get the pneumonia vaccine as a series of two separate shots.  The hepatitis B vaccine is recommended for adults shortly after they have been diagnosed with diabetes.  The Tdap (tetanus, diphtheria, and pertussis) vaccine should be given: ? According to normal childhood vaccination schedules, for children. ? Every 10 years, for adults who have diabetes.  The shingles vaccine is recommended for people who have had chicken pox and are 81 years or older.  Mental and emotional health  Screening for symptoms of eating disorders, anxiety, and depression is recommended at the time of diagnosis and afterward as needed. If your screening shows that you have symptoms (you have a positive screening result), you may need further evaluation and be referred to a mental health care provider.  Diabetes self-management education  Education about how to manage your diabetes is recommended at diagnosis and ongoing as needed.  Treatment plan  Your treatment plan will be reviewed at every medical visit.  Summary  Managing diabetes (diabetes mellitus) can be complicated. Your diabetes treatment may be managed by a team of health care providers.  Your health care providers follow a schedule in order to help you get the best quality of care.  Standards of care including having regular physical  exams, blood tests, blood pressure monitoring, immunizations, screening tests, and education about how to manage your diabetes.  Your health care providers may also give you more specific instructions based on your individual health.      Type 2 Diabetes Mellitus, Self Care, Adult Caring for yourself after you have been diagnosed with type 2 diabetes (type 2 diabetes mellitus) means keeping your blood sugar (glucose) under control with a balance of:  Nutrition.  Exercise.  Lifestyle changes.  Medicines or insulin, if necessary.  Support from your team of health care providers and others.  The following information explains what you need to know to manage your diabetes at home. What do I need to do to manage my blood glucose?  Check your blood glucose every day, as often as told by your health care provider.  Contact your  health care provider if your blood glucose is above your target for 2 tests in a row.  Have your A1c (hemoglobin A1c) level checked at least two times a year, or as often as told by your health care provider. Your health care provider will set individualized treatment goals for you. Generally, the goal of treatment is to maintain the following blood glucose levels:  Before meals (preprandial): 80-130 mg/dL (4.4-7.2 mmol/L).  After meals (postprandial): below 180 mg/dL (10 mmol/L).  A1c level: less than 7%.  What do I need to know about hyperglycemia and hypoglycemia? What is hyperglycemia? Hyperglycemia, also called high blood glucose, occurs when blood glucose is too high.Make sure you know the early signs of hyperglycemia, such as:  Increased thirst.  Hunger.  Feeling very tired.  Needing to urinate more often than usual.  Blurry vision.  What is hypoglycemia? Hypoglycemia, also called low blood glucose, occurswith a blood glucose level at or below 70 mg/dL (3.9 mmol/L). The risk for hypoglycemia increases during or after exercise, during  sleep, during illness, and when skipping meals or not eating for a long time (fasting). It is important to know the symptoms of hypoglycemia and treat it right away. Always have a 15-gram rapid-acting carbohydrate snack with you to treat low blood glucose. Family members and close friends should also know the symptoms and should understand how to treat hypoglycemia, in case you are not able to treat yourself. What are the symptoms of hypoglycemia? Hypoglycemia symptoms can include:  Hunger.  Anxiety.  Sweating and feeling clammy.  Confusion.  Dizziness or feeling light-headed.  Sleepiness.  Nausea.  Increased heart rate.  Headache.  Blurry vision.  Seizure.  Nightmares.  Tingling or numbness around the mouth, lips, or tongue.  A change in speech.  Decreased ability to concentrate.  A change in coordination.  Restless sleep.  Tremors or shakes.  Fainting.  Irritability.  How do I treat hypoglycemia?  If you are alert and able to swallow safely, follow the 15:15 rule:  Take 15 grams of a rapid-acting carbohydrate. Rapid-acting options include: ? 1 tube of glucose gel. ? 3 glucose pills. ? 6-8 pieces of hard candy. ? 4 oz (120 mL) of fruit juice. ? 4 oz (120 mL) of regular (not diet) soda.  Check your blood glucose 15 minutes after you take the carbohydrate.  If the repeat blood glucose level is still at or below 70 mg/dL (3.9 mmol/L), take 15 grams of a carbohydrate again.  If your blood glucose level does not increase above 70 mg/dL (3.9 mmol/L) after 3 tries, seek emergency medical care.  After your blood glucose level returns to normal, eat a meal or a snack within 1 hour.  How do I treat severe hypoglycemia? Severe hypoglycemia is when your blood glucose level is at or below 54 mg/dL (3 mmol/L). Severe hypoglycemia is an emergency. Do not wait to see if the symptoms will go away. Get medical help right away. Call your local emergency services (911 in  the [place] Do not drive yourself to the hospital. If you have severe hypoglycemia and you cannot eat or drink, you may need an injection of glucagon. A family member or close friend should learn how to check your blood glucose and how to give you a glucagon injection. Ask your health care provider if you need to have an emergency glucagon injection kit available. Severe hypoglycemia may need to be treated in a hospital. The treatment may include getting glucose through an  IV tube. You may also need treatment for the cause of your hypoglycemia. Can having diabetes put me at risk for other conditions? Having diabetes can put you at risk for other long-term (chronic) conditions, such as heart disease and kidney disease. Your health care provider may prescribe medicines to help prevent complications from diabetes. These medicines may include:  Aspirin.  Medicine to lower cholesterol.  Medicine to control blood pressure.  What else can I do to manage my diabetes? Take your diabetes medicines as told  If your health care provider prescribed insulin or diabetes medicines, take them every day.  Do not run out of insulin or other diabetes medicines that you take. Plan ahead so you always have these available.  If you use insulin, adjust your dosage based on how physically active you are and what foods you eat. Your health care provider will tell you how to adjust your dosage. Make healthy food choices  The things that you eat and drink affect your blood glucose and your insulin dosage. Making good choices helps to control your diabetes and prevent other health problems. A healthy meal plan includes eating lean proteins, complex carbohydrates, fresh fruits and vegetables, low-fat dairy products, and healthy fats. Make an appointment to see a diet and nutrition specialist (registered dietitian) to help you create an eating plan that is right for you. Make sure that you:  Follow instructions from your  health care provider about eating or drinking restrictions.  Drink enough fluid to keep your urine clear or pale yellow.  Eat healthy snacks between nutritious meals.  Track the carbohydrates that you eat. Do this by reading food labels and learning the standard serving sizes of foods.  Follow your sick day plan whenever you cannot eat or drink as usual. Make this plan in advance with your health care provider.  Stay active  Exercise regularly, as told by your health care provider. This may include:  Stretching and doing strength exercises, such as yoga or weightlifting, at least 2 times a week.  Doing at least 150 minutes of moderate-intensity or vigorous-intensity exercise each week. This could be brisk walking, biking, or water aerobics. ? Spread out your activity over at least 3 days of the week. ? Do not go more than 2 days in a row without doing some kind of physical activity.  When you start a new exercise or activity, work with your health care provider to adjust your insulin, medicines, or food intake as needed. Make healthy lifestyle choices  Do not use any tobacco products, such as cigarettes, chewing tobacco, and e-cigarettes. If you need help quitting, ask your health care provider.  If your health care provider says that alcohol is safe for you, limit alcohol intake to no more than 1 drink per day for nonpregnant women and 2 drinks per day for men. One drink equals 12 oz of beer, 5 oz of wine, or 1 oz of hard liquor.  Learn to manage stress. If you need help with this, ask your health care provider. Care for your body   Keep your immunizations up to date. In addition to getting vaccinations as told by your health care provider, it is recommended that you get vaccinated against the following illnesses: ? The flu (influenza). Get a flu shot every year. ? Pneumonia. ? Hepatitis B.  Schedule an eye exam soon after your diagnosis, and then one time every year after  that.  Check your skin and feet every day for cuts,  bruises, redness, blisters, or sores. Schedule a foot exam with your health care provider once every year.  Brush your teeth and gums two times a day, and floss at least one time a day. Visit your dentist at least once every 6 months.  Maintain a healthy weight. General instructions  Take over-the-counter and prescription medicines only as told by your health care provider.  Share your diabetes management plan with people in your workplace, school, and household.  Check your urine for ketones when you are ill and as told by your health care provider.  Ask your health care provider: ? Do I need to meet with a diabetes educator? ? Where can I find a support group for people with diabetes?  Carry a medical alert card or wear medical alert jewelry.  Keep all follow-up visits as told by your health care provider. This is important. Where to find more information: For more information about diabetes, visit:  American Diabetes Association (ADA): www.diabetes.org  American Association of Diabetes Educators (AADE): www.diabeteseducator.org/patient-resources  This information is not intended to replace advice given to you by your health care provider. Make sure you discuss any questions you have with your health care provider. Document Released: 02/24/2016 Document Revised: 04/09/2016 Document Reviewed: 12/06/2015 Elsevier Interactive Patient Education  2017 [place].      Blood Glucose Monitoring, Adult Monitoring your blood sugar (glucose) helps you manage your diabetes. It also helps you and your health care provider determine how well your diabetes management plan is working. Blood glucose monitoring involves checking your blood glucose as often as directed, and keeping a record (log) of your results over time. Why should I monitor my blood glucose? Checking your blood glucose regularly can:  Help you understand how food,  exercise, illnesses, and medicines affect your blood glucose.  Let you know what your blood glucose is at any time. You can quickly tell if you are having low blood glucose (hypoglycemia) or high blood glucose (hyperglycemia).  Help you and your health care provider adjust your medicines as needed.  When should I check my blood glucose? Follow instructions from your health care provider about how often to check your blood glucose.   This may depend on:  The type of diabetes you have.  How well-controlled your diabetes is.  Medicines you are taking.  If you have type 1 diabetes:  Check your blood glucose at least 2 times a day.  Also check your blood glucose: ? Before every insulin injection. ? Before and after exercise. ? Between meals. ? 2 hours after a meal. ? Occasionally between 2:00 a.m. and 3:00 a.m., as directed. ? Before potentially dangerous tasks, like driving or using heavy machinery. ? At bedtime.  You may need to check your blood glucose more often, up to 6-10 times a day: ? If you use an insulin pump. ? If you need multiple daily injections (MDI). ? If your diabetes is not well-controlled. ? If you are ill. ? If you have a history of severe hypoglycemia. ? If you have a history of not knowing when your blood glucose is getting low (hypoglycemia unawareness).  If you have type 2 diabetes:  If you take insulin or other diabetes medicines, check your blood glucose at least 2 times a day.  If you are on intensive insulin therapy, check your blood glucose at least 4 times a day. Occasionally, you may also need to check between 2:00 a.m. and 3:00 a.m., as directed.  Also check your  blood glucose: ? Before and after exercise. ? Before potentially dangerous tasks, like driving or using heavy machinery.  You may need to check your blood glucose more often if: ? Your medicine is being adjusted. ? Your diabetes is not well-controlled. ? You are ill.  What is a  blood glucose log?  A blood glucose log is a record of your blood glucose readings. It helps you and your health care provider: ? Look for patterns in your blood glucose over time. ? Adjust your diabetes management plan as needed.  Every time you check your blood glucose, write down your result and notes about things that may be affecting your blood glucose, such as your diet and exercise for the day.  Most glucose meters store a record of glucose readings in the meter. Some meters allow you to download your records to a computer. How do I check my blood glucose? Follow these steps to get accurate readings of your blood glucose: Supplies needed   Blood glucose meter.  Test strips for your meter. Each meter has its own strips. You must use the strips that come with your meter.  A needle to prick your finger (lancet). Do not use lancets more than once.  A device that holds the lancet (lancing device).  A journal or log book to write down your results.  Procedure  Wash your hands with soap and water.  Prick the side of your finger (not the tip) with the lancet. Use a different finger each time.  Gently rub the finger until a small drop of blood appears.  Follow instructions that come with your meter for inserting the test strip, applying blood to the strip, and using your blood glucose meter.  Write down your result and any notes.  Alternative testing sites  Some meters allow you to use areas of your body other than your finger (alternative sites) to test your blood.  If you think you may have hypoglycemia, or if you have hypoglycemia unawareness, do not use alternative sites. Use your finger instead.  Alternative sites may not be as accurate as the fingers, because blood flow is slower in these areas. This means that the result you get may be delayed, and it may be different from the result that you would get from your finger.  The most common alternative sites  are: ? Forearm. ? Thigh. ? Palm of the hand.  Additional tips  Always keep your supplies with you.  If you have questions or need help, all blood glucose meters have a 24-hour "hotline" number that you can call. You may also contact your health care provider.  After you use a few boxes of test strips, adjust (calibrate) your blood glucose meter by following instructions that came with your meter.    The American Diabetes Association suggests the following targets for most nonpregnant adults with diabetes.  More or less stringent glycemic goals may be appropriate for each individual.  A1C: Less than 7% A1C may also be reported as eAG: Less than 154 mg/dl Before a meal (preprandial plasma glucose): 80-130 mg/dl 1-2 hours after beginning of the meal (Postprandial plasma glucose)*: Less than 180 mg/dl  *Postprandial glucose may be targeted if A1C goals are not met despite reaching preprandial glucose goals.   GOALS in short:  The goals are for the Hgb A1C to be less than 7.0 & blood pressure to be less than 130/80.    It is recommended that all diabetics are educated on and  follow a healthy diabetic diet, exercise for 30 minutes 3-4 times per week (walking, biking, swimming, or machine), monitor blood glucose readings and bring that record with you to be reviewed at your next office visit.     You should be checking fasting blood sugars- especially after you eat poorly or eat really healthy, and also check 2 hour postprandial blood sugars after largest meal of the day.    Write these down and bring in your log at each office visit.    You will need to be seen every 3 months by the provider managing your Diabetes unless told otherwise by that provider.   You will need yearly eye exams from an eye specialist and foot exams to check the nerves of your feet.  Also, your urine should be checked yearly as well to make sure excess protein is not present.   If you are checking your blood  pressure at home, please record it and bring it to your next office visit.    Follow the Dietary Approaches to Stop Hypertension (DASH) diet (3 servings of fruit and vegetables daily, whole grains, low sodium, low-fat proteins).  See below.    Lastly, when it comes to your cholesterol, the goal is to have the HDL (good cholesterol) >40, and the LDL (bad cholesterol) <100.   It is recommended that you follow a heart healthy, low saturated and trans-fat diet and exercise for 30 minutes at least 5 times a week.     (( Check out the DASH diet = 1.5 Gram Low Sodium Diet   A 1.5 gram sodium diet restricts the amount of sodium in the diet to no more than 1.5 g or 1500 mg daily.  The American Heart Association recommends Americans over the age of 64 to consume no more than 1500 mg of sodium each day to reduce the risk of developing high blood pressure.  Research also shows that limiting sodium may reduce heart attack and stroke risk.  Many foods contain sodium for flavor and sometimes as a preservative.  When the amount of sodium in a diet needs to be low, it is important to know what to look for when choosing foods and drinks.  The following includes some information and guidelines to help make it easier for you to adapt to a low sodium diet.    QUICK TIPS  Do not add salt to food.  Avoid convenience items and fast food.  Choose unsalted snack foods.  Buy lower sodium products, often labeled as "lower sodium" or "no salt added."  Check food labels to learn how much sodium is in 1 serving.  When eating at a restaurant, ask that your food be prepared with less salt or none, if possible.    READING FOOD LABELS FOR SODIUM INFORMATION  The nutrition facts label is a good place to find how much sodium is in foods. Look for products with no more than 400 mg of sodium per serving.  Remember that 1.5 g = 1500 mg.  The food label may also list foods as:  Sodium-free: Less than 5 mg in a serving.  Very low  sodium: 35 mg or less in a serving.  Low-sodium: 140 mg or less in a serving.  Light in sodium: 50% less sodium in a serving. For example, if a food that usually has 300 mg of sodium is changed to become light in sodium, it will have 150 mg of sodium.  Reduced sodium: 25% less sodium in  a serving. For example, if a food that usually has 400 mg of sodium is changed to reduced sodium, it will have 300 mg of sodium.    CHOOSING FOODS  Grains  Avoid: Salted crackers and snack items. Some cereals, including instant hot cereals. Bread stuffing and biscuit mixes. Seasoned rice or pasta mixes.  Choose: Unsalted snack items. Low-sodium cereals, oats, puffed wheat and rice, shredded wheat. English muffins and bread. Pasta.  Meats  Avoid: Salted, canned, smoked, spiced, pickled meats, including fish and poultry. Bacon, ham, sausage, cold cuts, hot dogs, anchovies.  Choose: Low-sodium canned tuna and salmon. Fresh or frozen meat, poultry, and fish.  Dairy  Avoid: Processed cheese and spreads. Cottage cheese. Buttermilk and condensed milk. Regular cheese.  Choose: Milk. Low-sodium cottage cheese. Yogurt. Sour cream. Low-sodium cheese.  Fruits and Vegetables  Avoid: Regular canned vegetables. Regular canned tomato sauce and paste. Frozen vegetables in sauces. Olives. Angie Fava. Relishes. Sauerkraut.  Choose: Low-sodium canned vegetables. Low-sodium tomato sauce and paste. Frozen or fresh vegetables. Fresh and frozen fruit.  Condiments  Avoid: Canned and packaged gravies. Worcestershire sauce. Tartar sauce. Barbecue sauce. Soy sauce. Steak sauce. Ketchup. Onion, garlic, and table salt. Meat flavorings and tenderizers.  Choose: Fresh and dried herbs and spices. Low-sodium varieties of mustard and ketchup. Lemon juice. Tabasco sauce. Horseradish.    SAMPLE 1.5 GRAM SODIUM MEAL PLAN:   Breakfast / Sodium (mg)  1 cup low-fat milk / 143 mg  1 whole-wheat English muffin / 240 mg  1 tbs heart-healthy margarine /  153 mg  1 hard-boiled egg / 139 mg  1 small orange / 0 mg  Lunch / Sodium (mg)  1 cup raw carrots / 76 mg  2 tbs no salt added peanut butter / 5 mg  2 slices whole-wheat bread / 270 mg  1 tbs jelly / 6 mg   cup red grapes / 2 mg  Dinner / Sodium (mg)  1 cup whole-wheat pasta / 2 mg  1 cup low-sodium tomato sauce / 73 mg  3 oz lean ground beef / 57 mg  1 small side salad (1 cup raw spinach leaves,  cup cucumber,  cup yellow bell pepper) with 1 tsp olive oil and 1 tsp red wine vinegar / 25 mg  Snack / Sodium (mg)  1 container low-fat vanilla yogurt / 107 mg  3 graham cracker squares / 127 mg  Nutrient Analysis  Calories: 1745  Protein: 75 g  Carbohydrate: 237 g  Fat: 57 g  Sodium: 1425 mg  Document Released: 11/02/2005 Document Revised: 07/15/2011 Document Reviewed: 02/03/2010  ExitCare Patient Information 2012 [place].))    This information is not intended to replace advice given to you by your health care provider. Make sure you discuss any questions you have with your health care provider. Document Released: 11/05/2003 Document Revised: 05/22/2016 Document Reviewed: 04/13/2016 Elsevier Interactive Patient Education  2017 [employer].

## 2019-12-10 ENCOUNTER — Other Ambulatory Visit: Payer: Self-pay | Admitting: Family Medicine

## 2019-12-10 DIAGNOSIS — E1169 Type 2 diabetes mellitus with other specified complication: Secondary | ICD-10-CM

## 2019-12-10 DIAGNOSIS — E1159 Type 2 diabetes mellitus with other circulatory complications: Secondary | ICD-10-CM

## 2019-12-10 DIAGNOSIS — I152 Hypertension secondary to endocrine disorders: Secondary | ICD-10-CM

## 2019-12-11 DIAGNOSIS — R972 Elevated prostate specific antigen [PSA]: Secondary | ICD-10-CM | POA: Diagnosis not present

## 2019-12-15 ENCOUNTER — Ambulatory Visit: Payer: Medicare Other

## 2019-12-18 DIAGNOSIS — N400 Enlarged prostate without lower urinary tract symptoms: Secondary | ICD-10-CM | POA: Diagnosis not present

## 2019-12-18 DIAGNOSIS — R972 Elevated prostate specific antigen [PSA]: Secondary | ICD-10-CM | POA: Diagnosis not present

## 2019-12-20 ENCOUNTER — Ambulatory Visit: Payer: Medicare Other

## 2019-12-23 ENCOUNTER — Ambulatory Visit: Payer: Medicare Other | Attending: Internal Medicine

## 2019-12-23 DIAGNOSIS — Z23 Encounter for immunization: Secondary | ICD-10-CM | POA: Insufficient documentation

## 2019-12-23 NOTE — Progress Notes (Signed)
   Covid-19 Vaccination Clinic  Name:  Nathan Gomez    MRN: [id number] DOB: 10-23-1941  12/23/2019  [name] was observed post Covid-19 immunization for 15 minutes without incidence. He was provided with Vaccine Information Sheet and instruction to access the V-Safe system.   [name] was instructed to call 911 with any severe reactions post vaccine: Marland Kitchen Difficulty breathing  . Swelling of your face and throat  . A fast heartbeat  . A bad rash all over your body  . Dizziness and weakness    Immunizations Administered    Name Date Dose VIS Date Route   Pfizer COVID-19 Vaccine 12/23/2019  3:29 PM 0.3 mL 10/27/2019 Intramuscular   Manufacturer: [place]   Lot: [id number]   [place]: [id number]

## 2019-12-29 DIAGNOSIS — M79674 Pain in right toe(s): Secondary | ICD-10-CM | POA: Diagnosis not present

## 2019-12-29 DIAGNOSIS — M1712 Unilateral primary osteoarthritis, left knee: Secondary | ICD-10-CM | POA: Diagnosis not present

## 2020-01-08 ENCOUNTER — Other Ambulatory Visit: Payer: Self-pay | Admitting: Family Medicine

## 2020-01-12 ENCOUNTER — Ambulatory Visit: Payer: Medicare Other

## 2020-01-17 ENCOUNTER — Ambulatory Visit: Payer: Medicare Other | Attending: Internal Medicine

## 2020-01-17 DIAGNOSIS — Z23 Encounter for immunization: Secondary | ICD-10-CM

## 2020-01-17 NOTE — Progress Notes (Signed)
   Covid-19 Vaccination Clinic  Name:  Nathan Gomez    MRN: [id number] DOB: 17-Jul-1941  01/17/2020  [name] was observed post Covid-19 immunization for 15 minutes without incident. He was provided with Vaccine Information Sheet and instruction to access the V-Safe system.   [name] was instructed to call 911 with any severe reactions post vaccine: Marland Kitchen Difficulty breathing  . Swelling of face and throat  . A fast heartbeat  . A bad rash all over body  . Dizziness and weakness   Immunizations Administered    Name Date Dose VIS Date Route   Pfizer COVID-19 Vaccine 01/17/2020  2:22 PM 0.3 mL 10/27/2019 Intramuscular   Manufacturer: [place]   Lot: [id number]   [place]: [id number]

## 2020-02-07 ENCOUNTER — Other Ambulatory Visit: Payer: Self-pay | Admitting: Family Medicine

## 2020-02-12 DIAGNOSIS — M1712 Unilateral primary osteoarthritis, left knee: Secondary | ICD-10-CM | POA: Diagnosis not present

## 2020-02-19 DIAGNOSIS — M1712 Unilateral primary osteoarthritis, left knee: Secondary | ICD-10-CM | POA: Diagnosis not present

## 2020-02-26 ENCOUNTER — Ambulatory Visit: Payer: Medicare Other | Admitting: Family Medicine

## 2020-02-26 DIAGNOSIS — M1712 Unilateral primary osteoarthritis, left knee: Secondary | ICD-10-CM | POA: Diagnosis not present

## 2020-03-03 ENCOUNTER — Encounter: Payer: Self-pay | Admitting: Family Medicine

## 2020-03-04 ENCOUNTER — Other Ambulatory Visit: Payer: Self-pay | Admitting: Family Medicine

## 2020-03-04 MED ORDER — METFORMIN HCL 500 MG PO TABS: ORAL_TABLET | ORAL | 0 refills | Status: DC

## 2020-03-08 ENCOUNTER — Other Ambulatory Visit: Payer: Self-pay | Admitting: Family Medicine

## 2020-03-18 ENCOUNTER — Encounter: Payer: Self-pay | Admitting: Family Medicine

## 2020-04-04 DIAGNOSIS — R972 Elevated prostate specific antigen [PSA]: Secondary | ICD-10-CM | POA: Diagnosis not present

## 2020-05-01 ENCOUNTER — Other Ambulatory Visit: Payer: Self-pay

## 2020-05-01 ENCOUNTER — Encounter: Payer: Self-pay | Admitting: Family Medicine

## 2020-05-01 ENCOUNTER — Ambulatory Visit (INDEPENDENT_AMBULATORY_CARE_PROVIDER_SITE_OTHER): Payer: Medicare Other | Admitting: Family Medicine

## 2020-05-01 VITALS — BP 124/66 | HR 67 | Temp 97.6°F | Ht 71.0 in | Wt 198.2 lb

## 2020-05-01 DIAGNOSIS — E538 Deficiency of other specified B group vitamins: Secondary | ICD-10-CM

## 2020-05-01 DIAGNOSIS — E1129 Type 2 diabetes mellitus with other diabetic kidney complication: Secondary | ICD-10-CM | POA: Diagnosis not present

## 2020-05-01 DIAGNOSIS — I10 Essential (primary) hypertension: Secondary | ICD-10-CM | POA: Diagnosis not present

## 2020-05-01 DIAGNOSIS — E1159 Type 2 diabetes mellitus with other circulatory complications: Secondary | ICD-10-CM | POA: Diagnosis not present

## 2020-05-01 DIAGNOSIS — N1832 Chronic kidney disease, stage 3b: Secondary | ICD-10-CM | POA: Diagnosis not present

## 2020-05-01 DIAGNOSIS — I152 Hypertension secondary to endocrine disorders: Secondary | ICD-10-CM

## 2020-05-01 DIAGNOSIS — E559 Vitamin D deficiency, unspecified: Secondary | ICD-10-CM

## 2020-05-01 DIAGNOSIS — E785 Hyperlipidemia, unspecified: Secondary | ICD-10-CM

## 2020-05-01 DIAGNOSIS — E1169 Type 2 diabetes mellitus with other specified complication: Secondary | ICD-10-CM | POA: Diagnosis not present

## 2020-05-01 DIAGNOSIS — E612 Magnesium deficiency: Secondary | ICD-10-CM

## 2020-05-01 LAB — CBC
HCT: 37.5 % — ABNORMAL LOW (ref 39.0–52.0)
Hemoglobin: 12.5 g/dL — ABNORMAL LOW (ref 13.0–17.0)
MCHC: 33.4 g/dL (ref 30.0–36.0)
MCV: 95.7 fl (ref 78.0–100.0)
Platelets: 209 10*3/uL (ref 150.0–400.0)
RBC: 3.92 Mil/uL — ABNORMAL LOW (ref 4.22–5.81)
RDW: 13.8 % (ref 11.5–15.5)
WBC: 5.1 10*3/uL (ref 4.0–10.5)

## 2020-05-01 LAB — URINALYSIS, ROUTINE W REFLEX MICROSCOPIC
Bilirubin Urine: NEGATIVE
Hgb urine dipstick: NEGATIVE
Ketones, ur: NEGATIVE
Leukocytes,Ua: NEGATIVE
Nitrite: NEGATIVE
RBC / HPF: NONE SEEN (ref 0–?)
Specific Gravity, Urine: 1.025 (ref 1.000–1.030)
Total Protein, Urine: NEGATIVE
Urine Glucose: NEGATIVE
Urobilinogen, UA: 0.2 (ref 0.0–1.0)
pH: 5.5 (ref 5.0–8.0)

## 2020-05-01 LAB — COMPREHENSIVE METABOLIC PANEL
ALT: 13 U/L (ref 0–53)
AST: 15 U/L (ref 0–37)
Albumin: 4.3 g/dL (ref 3.5–5.2)
Alkaline Phosphatase: 90 U/L (ref 39–117)
BUN: 32 mg/dL — ABNORMAL HIGH (ref 6–23)
CO2: 26 mEq/L (ref 19–32)
Calcium: 9 mg/dL (ref 8.4–10.5)
Chloride: 106 mEq/L (ref 96–112)
Creatinine, Ser: 1.59 mg/dL — ABNORMAL HIGH (ref 0.40–1.50)
GFR: 41.99 mL/min — ABNORMAL LOW (ref >60.00)
Glucose, Bld: 150 mg/dL — ABNORMAL HIGH (ref 70–99)
Potassium: 5 mEq/L (ref 3.5–5.1)
Sodium: 141 mEq/L (ref 135–145)
Total Bilirubin: 0.3 mg/dL (ref 0.2–1.2)
Total Protein: 6.4 g/dL (ref 6.0–8.3)

## 2020-05-01 LAB — VITAMIN D 25 HYDROXY (VIT D DEFICIENCY, FRACTURES): VITD: 43.27 ng/mL (ref 30.00–100.00)

## 2020-05-01 LAB — MAGNESIUM: Magnesium: 1.5 mg/dL (ref 1.5–2.5)

## 2020-05-01 LAB — LIPID PANEL
Cholesterol: 170 mg/dL (ref 0–200)
HDL: 34.8 mg/dL — ABNORMAL LOW (ref >39.00)
LDL Cholesterol: 101 mg/dL — ABNORMAL HIGH (ref 0–99)
NonHDL: 135.03
Total CHOL/HDL Ratio: 5
Triglycerides: 172 mg/dL — ABNORMAL HIGH (ref 0.0–149.0)
VLDL: 34.4 mg/dL (ref 0.0–40.0)

## 2020-05-01 LAB — LDL CHOLESTEROL, DIRECT: Direct LDL: 99 mg/dL

## 2020-05-01 LAB — MICROALBUMIN / CREATININE URINE RATIO
Creatinine,U: 185.9 mg/dL
Microalb Creat Ratio: 4.6 mg/g (ref 0.0–30.0)
Microalb, Ur: 8.6 mg/dL — ABNORMAL HIGH (ref 0.0–1.9)

## 2020-05-01 LAB — HEMOGLOBIN A1C: Hgb A1c MFr Bld: 7.7 % — ABNORMAL HIGH (ref 4.6–6.5)

## 2020-05-01 LAB — VITAMIN B12: Vitamin B-12: 280 pg/mL (ref 211–911)

## 2020-05-01 MED ORDER — LISINOPRIL 20 MG PO TABS: 20.0000 mg | ORAL_TABLET | Freq: Every day | ORAL | 3 refills | Status: DC

## 2020-05-01 MED ORDER — ATORVASTATIN CALCIUM 20 MG PO TABS: ORAL_TABLET | ORAL | 1 refills | Status: DC

## 2020-05-01 MED ORDER — METFORMIN HCL ER 500 MG PO TB24: ORAL_TABLET | ORAL | 1 refills | Status: DC

## 2020-05-01 MED ORDER — AMLODIPINE BESYLATE 10 MG PO TABS: 10.0000 mg | ORAL_TABLET | Freq: Every day | ORAL | 3 refills | Status: DC

## 2020-05-01 NOTE — Progress Notes (Signed)
Established Patient Office Visit  Subjective:  Patient ID: Nathan Gomez, male    DOB: 1941/03/05  Age: 79 y.o. MRN: [id number]  CC:  Chief Complaint  Patient presents with  . Establish Care    New patient, C/O diarrhea that come and go pretty frequent    HPI Nathan Gomez presents for follow-up of his diabetes, hypertension, elevated cholesterol, vitamin D deficiency, but vitamin B12 deficiency and magnesium deficiency.  Taking high-dose magnesium and plain Glucophage.  Has intermittent loose stool.  Seeing urology for elevated PSA.  Biopsy may be pending.  No history of CHF.  He is taking Norvasc.  Past Medical History:  Diagnosis Date  . Achilles tendinitis   . Allergy   . Cataract   . Chronic kidney disease   . Complication of anesthesia    urinary retention  . 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 Diabetes mellitus, type 2 ([place])   . Dyslipidemia   . GERD (gastroesophageal reflux disease)   . Heart murmur    child  . Hyperlipidemia   . Hypertension   . Osteoarthritis    right knee  . Personal history of colonic adenomas 01/16/2013  . Plantar fasciitis     Past Surgical History:  Procedure Laterality Date  . CATARACT EXTRACTION  08/2012 L, 09/2012 R  . COLONOSCOPY    . TONSILLECTOMY  1945  . TONSILLECTOMY    . TOTAL HIP ARTHROPLASTY  2011   Left  . TOTAL HIP ARTHROPLASTY Right 07/09/2015   Procedure: TOTAL HIP ARTHROPLASTY ANTERIOR APPROACH;  Surgeon: Melrose Nakayama, MD;  Location: [place];  Service: Orthopedics;  Laterality: Right;  . TOTAL KNEE ARTHROPLASTY Right 07/21/2016   Procedure: RIGHT TOTAL KNEE ARTHROPLASTY;  Surgeon: Melrose Nakayama, MD;  Location: [place];  Service: Orthopedics;  Laterality: Right;    Family History  Problem Relation Age of Onset  . Ovarian cancer Mother   . Hypertension Father   . Diabetes Maternal Grandfather   . Pneumonia Paternal Grandfather   . Colon cancer Neg Hx   . Esophageal cancer Neg Hx   . Liver cancer  Neg Hx   . Pancreatic cancer Neg Hx   . Rectal cancer Neg Hx   . Stomach cancer Neg Hx     Social History   Socioeconomic History  . Marital status: Married    Spouse name: Not on file  . Number of children: Not on file  . Years of education: Not on file  . Highest education level: Not on file  Occupational History  . Not on file  Tobacco Use  . Smoking status: Former Smoker    Packs/day: 1.00    Years: 20.00    Pack years: 20.00    Quit date: 11/16/1986    Years since quitting: 33.4  . Smokeless tobacco: Never Used  Vaping Use  . Vaping Use: Never used  Substance and Sexual Activity  . Alcohol use: Yes    Alcohol/week: 1.0 - 2.0 standard drink    Types: 1 - 2 Glasses of wine per week    Comment: occ  . Drug use: No  . Sexual activity: Not on file  Other Topics Concern  . Not on file  Social History Narrative  . Not on file   Social Determinants of Health   Financial Resource Strain:   . Difficulty of Paying Living Expenses:   Food Insecurity:   . Worried About Charity fundraiser in the Last Year:   .  Ran Out of Food in the Last Year:   Transportation Needs:   . Film/video editor (Medical):   Nathan Gomez Lack of Transportation (Non-Medical):   Physical Activity:   . Days of Exercise per Week:   . Minutes of Exercise per Session:   Stress:   . Feeling of Stress :   Social Connections:   . Frequency of Communication with Friends and Family:   . Frequency of Social Gatherings with Friends and Family:   . Attends Religious Services:   . Active Member of Clubs or Organizations:   . Attends Archivist Meetings:   Nathan Gomez Marital Status:   Intimate Partner Violence:   . Fear of Current or Ex-Partner:   . Emotionally Abused:   Nathan Gomez Physically Abused:   . Sexually Abused:     Outpatient Medications Prior to Visit  Medication Sig Dispense Refill  . blood glucose meter kit and supplies KIT Use to test blood sugar up to twice a day. DX E11.09 1 each 0  .  Cholecalciferol (VITAMIN D) 2000 units CAPS Take 1 capsule (2,000 Units total) by mouth every morning. 90 capsule 3  . Cyanocobalamin (VITAMIN B12 PO) Take 1 tablet by mouth daily.    Nathan Gomez glucose blood test strip Use as instructed to test blood sugar up to twice a day. DX:  E11.09 200 each 3  . Krill Oil 300 MG CAPS Take 500 mg by mouth daily.     . Lancets 24M MISC Delica Fine Lancets. Use to test blood sugar up to twice a day. DX E11.09 200 each 3  . magnesium oxide (MAG-OX) 400 MG tablet Take 1 tablet (400 mg total) by mouth 3 (three) times daily. 360 tablet 3  . Multiple Vitamins-Minerals (SENIOR MULTIVITAMIN PLUS) TABS Take 1 tablet by mouth daily.      Nathan Gomez omeprazole (PRILOSEC) 20 MG capsule Take 1 capsule (20 mg total) by mouth at bedtime. (Patient taking differently: Take 20 mg by mouth every other day. ) 90 capsule 3  . Probiotic Product (PROBIOTIC DAILY PO) Take by mouth.    . tamsulosin (FLOMAX) 0.4 MG CAPS capsule Take 1 capsule (0.4 mg total) by mouth at bedtime. (Patient taking differently: Take 0.4-0.8 mg by mouth See admin instructions. Take 0.4 mg by mouth daily. Starting 7 days before procedure take 0.66m by mouth daily) 14 capsule 0  . Triamcinolone Acetonide (NASACORT AQ NA) Place 1 spray into the nose daily as needed (allergies).    .Nathan KitchenamLODipine (NORVASC) 10 MG tablet TAKE 1 TABLET DAILY 90 tablet 1  . atorvastatin (LIPITOR) 20 MG tablet TAKE 1 TABLET AT BEDTIME 90 tablet 1  . lisinopril (ZESTRIL) 20 MG tablet TAKE 1 TABLET DAILY 90 tablet 1  . metFORMIN (GLUCOPHAGE) 500 MG tablet TAKE 2 TABLETS TWO TIMES A DAY WITH MEALS.**PATIENT NEEDS APT FOR FURTHER REFILLS** 60 tablet 0  . GLUCOSAMINE SULFATE PO Take 1 tablet by mouth daily.    .Nathan Kitchenloratadine (CLARITIN) 10 MG tablet Take 1 tablet (10 mg total) by mouth daily. (Patient taking differently: Take 10 mg by mouth daily as needed for allergies. ) 90 tablet 3   Facility-Administered Medications Prior to Visit  Medication Dose Route  Frequency Provider Last Rate Last Admin  . 0.9 %  sodium chloride infusion  500 mL Intravenous Once GGatha Mayer MD        Allergies  Allergen Reactions  . Nsaids     Kidney Disease  . Sulfa Antibiotics Other (See  Comments)    As child    Not sure rxn  . Thiazide-Type Diuretics Other (See Comments)    Unknown    ROS Review of Systems  Constitutional: Negative.   HENT: Negative.   Eyes: Negative for photophobia and visual disturbance.  Respiratory: Negative.   Cardiovascular: Negative.   Gastrointestinal: Negative.   Endocrine: Negative for polyphagia and polyuria.  Genitourinary: Negative.   Skin: Negative for pallor and rash.  Allergic/Immunologic: Negative for immunocompromised state.  Neurological: Negative for seizures and speech difficulty.  Hematological: Does not bruise/bleed easily.  Psychiatric/Behavioral: Negative.    Depression screen [facility] 2/9 05/01/2020 11/29/2019 08/15/2019  Decreased Interest 0 0 0  Down, Depressed, Hopeless 1 0 1  PHQ - 2 Score 1 0 1  Altered sleeping 0 0 0  Tired, decreased energy 1 0 0  Change in appetite 0 0 0  Feeling bad or failure about yourself  0 0 0  Trouble concentrating 0 0 0  Moving slowly or fidgety/restless 0 0 0  Suicidal thoughts 0 0 0  PHQ-9 Score 2 0 1  Difficult doing work/chores Not difficult at all - Not difficult at all  Some recent data might be hidden      Objective:    Physical Exam Vitals and nursing note reviewed.  Constitutional:      General: He is not in acute distress.    Appearance: Normal appearance. He is not ill-appearing, toxic-appearing or diaphoretic.  HENT:     Head: Normocephalic and atraumatic.     Right Ear: Tympanic membrane, ear canal and external ear normal.     Left Ear: Tympanic membrane, ear canal and external ear normal.  Eyes:     General: No scleral icterus.       Right eye: No discharge.        Left eye: No discharge.     Extraocular Movements: Extraocular movements  intact.     Conjunctiva/sclera: Conjunctivae normal.     Pupils: Pupils are equal, round, and reactive to light.  Cardiovascular:     Rate and Rhythm: Normal rate and regular rhythm.     Pulses:          Dorsalis pedis pulses are 2+ on the right side and 2+ on the left side.       Posterior tibial pulses are 1+ on the right side and 1+ on the left side.     Heart sounds: No gallop.   Pulmonary:     Effort: Pulmonary effort is normal.     Breath sounds: Normal breath sounds.  Abdominal:     General: Bowel sounds are normal.  Musculoskeletal:     Cervical back: No rigidity or tenderness.     Right lower leg: Edema present.     Left lower leg: Edema present.  Skin:    General: Skin is warm and dry.  Neurological:     Mental Status: He is alert and oriented to person, place, and time.  Psychiatric:        Mood and Affect: Mood normal.        Behavior: Behavior normal.    Diabetic Foot Exam - Simple   Simple Foot Form Visual Inspection See comments: Yes Sensation Testing Intact to touch and monofilament testing bilaterally: Yes Pulse Check Posterior Tibialis and Dorsalis pulse intact bilaterally: Yes Comments  Feet are cavus bilaterally.  Some of the nails are thick and oncotic.    BP 124/66   Pulse 67  Temp 97.6 F (36.4 C) (Tympanic)   Ht 5' 11"  (1.803 m)   Wt 198 lb 3.2 oz (89.9 kg)   SpO2 95%   BMI 27.64 kg/m  Wt Readings from Last 3 Encounters:  05/01/20 198 lb 3.2 oz (89.9 kg)  11/29/19 199 lb 6.4 oz (90.4 kg)  10/25/19 199 lb (90.3 kg)     Health Maintenance Due  Topic Date Due  . FOOT EXAM  09/27/2019    There are no preventive care reminders to display for this patient.  Lab Results  Component Value Date   TSH 2.700 07/05/2019   Lab Results  Component Value Date   WBC 5.0 07/05/2019   HGB 12.5 (L) 07/05/2019   HCT 37.5 07/05/2019   MCV 93 07/05/2019   PLT 188 07/05/2019   Lab Results  Component Value Date   NA 139 07/05/2019   K 5.3  (H) 07/05/2019   CO2 21 07/05/2019   GLUCOSE 153 (H) 07/05/2019   BUN 24 07/05/2019   CREATININE 1.53 (H) 07/05/2019   BILITOT 0.5 07/05/2019   ALKPHOS 106 07/05/2019   AST 14 07/05/2019   ALT 12 07/05/2019   PROT 6.5 07/05/2019   ALBUMIN 4.3 07/05/2019   CALCIUM 9.2 07/05/2019   ANIONGAP 5 07/10/2016   GFR 60.51 02/23/2017   Lab Results  Component Value Date   CHOL 151 07/05/2019   Lab Results  Component Value Date   HDL 40 07/05/2019   Lab Results  Component Value Date   LDLCALC 79 07/05/2019   Lab Results  Component Value Date   TRIG 162 (H) 07/05/2019   Lab Results  Component Value Date   CHOLHDL 3.8 07/05/2019   Lab Results  Component Value Date   HGBA1C 6.7 (A) 11/29/2019      Assessment & Plan:   Problem List Items Addressed This Visit      Cardiovascular and Mediastinum   Hypertension associated with diabetes ([place]) (Chronic)   Relevant Medications   metFORMIN (GLUCOPHAGE-XR) 500 MG 24 hr tablet   amLODipine (NORVASC) 10 MG tablet   atorvastatin (LIPITOR) 20 MG tablet   lisinopril (ZESTRIL) 20 MG tablet   Other Relevant Orders   CBC   Comprehensive metabolic panel   Urinalysis, Routine w reflex microscopic   Microalbumin / creatinine urine ratio     Endocrine   Hyperlipidemia associated with type 2 diabetes mellitus ([facility]) (Chronic)   Relevant Medications   metFORMIN (GLUCOPHAGE-XR) 500 MG 24 hr tablet   atorvastatin (LIPITOR) 20 MG tablet   lisinopril (ZESTRIL) 20 MG tablet   Other Relevant Orders   Comprehensive metabolic panel   LDL cholesterol, direct   Lipid panel   Diabetes mellitus type II, controlled (H[facility]) - Primary   Relevant Medications   metFORMIN (GLUCOPHAGE-XR) 500 MG 24 hr tablet   atorvastatin (LIPITOR) 20 MG tablet   lisinopril (ZESTRIL) 20 MG tablet   Other Relevant Orders   Comprehensive metabolic panel   Hemoglobin A1c   Urinalysis, Routine w reflex microscopic   Microalbumin / creatinine urine ratio      Genitourinary   Stage 3b chronic kidney disease   Relevant Orders   Comprehensive metabolic panel   Microalbumin / creatinine urine ratio     Other   B12 deficiency   Relevant Orders   Vitamin B12   Vitamin D deficiency   Relevant Orders   VITAMIN D 25 Hydroxy (Vit-D Deficiency, Fractures)   Magnesium deficiency   Relevant Orders   Magnesium  Meds ordered this encounter  Medications  . metFORMIN (GLUCOPHAGE-XR) 500 MG 24 hr tablet    Sig: Take 2 tablets twice daily with meals.    Dispense:  360 tablet    Refill:  1  . amLODipine (NORVASC) 10 MG tablet    Sig: Take 1 tablet (10 mg total) by mouth daily.    Dispense:  90 tablet    Refill:  3  . atorvastatin (LIPITOR) 20 MG tablet    Sig: TAKE 1 TABLET AT BEDTIME    Dispense:  90 tablet    Refill:  1  . lisinopril (ZESTRIL) 20 MG tablet    Sig: Take 1 tablet (20 mg total) by mouth daily.    Dispense:  90 tablet    Refill:  3    Follow-up: Return in about 6 months (around 10/31/2020).   Advised patient to engage in aerobic exercise for at least 30 minutes 5 days a week.  He may use an exercise bike if it hurts his feet for him to walk.  Will make decisions on magnesium, vitamin D and B12 pending today's laboratory results.  Have switched patient to Glucophage Exar to help with diarrhea hopefully. Libby Maw, MD

## 2020-05-14 ENCOUNTER — Telehealth (INDEPENDENT_AMBULATORY_CARE_PROVIDER_SITE_OTHER): Payer: Medicare Other | Admitting: Family Medicine

## 2020-05-14 ENCOUNTER — Encounter: Payer: Self-pay | Admitting: Family Medicine

## 2020-05-14 VITALS — Ht 71.0 in | Wt 197.0 lb

## 2020-05-14 DIAGNOSIS — N1832 Chronic kidney disease, stage 3b: Secondary | ICD-10-CM | POA: Diagnosis not present

## 2020-05-14 DIAGNOSIS — E1129 Type 2 diabetes mellitus with other diabetic kidney complication: Secondary | ICD-10-CM | POA: Diagnosis not present

## 2020-05-14 DIAGNOSIS — E1122 Type 2 diabetes mellitus with diabetic chronic kidney disease: Secondary | ICD-10-CM

## 2020-05-14 DIAGNOSIS — E1169 Type 2 diabetes mellitus with other specified complication: Secondary | ICD-10-CM

## 2020-05-14 DIAGNOSIS — E538 Deficiency of other specified B group vitamins: Secondary | ICD-10-CM

## 2020-05-14 DIAGNOSIS — N183 Chronic kidney disease, stage 3 unspecified: Secondary | ICD-10-CM

## 2020-05-14 DIAGNOSIS — E785 Hyperlipidemia, unspecified: Secondary | ICD-10-CM

## 2020-05-14 DIAGNOSIS — I129 Hypertensive chronic kidney disease with stage 1 through stage 4 chronic kidney disease, or unspecified chronic kidney disease: Secondary | ICD-10-CM

## 2020-05-14 MED ORDER — DAPAGLIFLOZIN PROPANEDIOL 5 MG PO TABS: 5.0000 mg | ORAL_TABLET | Freq: Every day | ORAL | 2 refills | Status: DC

## 2020-05-14 NOTE — Progress Notes (Signed)
Established Patient Office Visit  Subjective:  Patient ID: Nathan Gomez, male    DOB: [birth year]  Age: 79 y.o. MRN: [id number]  CC:  Chief Complaint  Patient presents with  . Follow-up    discuss labs    HPI Nathan Gomez presents for follow-up of his recent lab work that showed multiple issues.  B12 was low.  He admits he has been taking half of a 500 mg pill.  LDL cholesterol had increased.  He has been compliant with his atorvastatin.  Diet has not changed appreciably as far as he can recall.  He has not been exercising.  Weight is stayed about the same.  BUN and creatinine have increased.  He does admit that he has not been hydrating as much as he should.  Also hemoglobin A1c increased by up about a point.  Has been compliant with the Glucophage.  He does have a possible prostate biopsy pending.  Past Medical History:  Diagnosis Date  . Achilles tendinitis   . Allergy   . Cataract   . Chronic kidney disease   . Complication of anesthesia    urinary retention  . 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 Diabetes mellitus, type 2 ([place])   . Dyslipidemia   . GERD (gastroesophageal reflux disease)   . Heart murmur    child  . Hyperlipidemia   . Hypertension   . Osteoarthritis    right knee  . Personal history of colonic adenomas 01/16/2013  . Plantar fasciitis     Past Surgical History:  Procedure Laterality Date  . CATARACT EXTRACTION  08/2012 L, 09/2012 R  . COLONOSCOPY    . TONSILLECTOMY  1945  . TONSILLECTOMY    . TOTAL HIP ARTHROPLASTY  2011   Left  . TOTAL HIP ARTHROPLASTY Right 07/09/2015   Procedure: TOTAL HIP ARTHROPLASTY ANTERIOR APPROACH;  Surgeon: Melrose Nakayama, MD;  Location: [facility];  Service: Orthopedics;  Laterality: Right;  . TOTAL KNEE ARTHROPLASTY Right 07/21/2016   Procedure: RIGHT TOTAL KNEE ARTHROPLASTY;  Surgeon: Melrose Nakayama, MD;  Location: [facility];  Service: Orthopedics;  Laterality: Right;    Family History  Problem Relation Age  of Onset  . Ovarian cancer Mother   . Hypertension Father   . Diabetes Maternal Grandfather   . Pneumonia Paternal Grandfather   . Colon cancer Neg Hx   . Esophageal cancer Neg Hx   . Liver cancer Neg Hx   . Pancreatic cancer Neg Hx   . Rectal cancer Neg Hx   . Stomach cancer Neg Hx     Social History   Socioeconomic History  . Marital status: Married    Spouse name: Not on file  . Number of children: Not on file  . Years of education: Not on file  . Highest education level: Not on file  Occupational History  . Not on file  Tobacco Use  . Smoking status: Former Smoker    Packs/day: 1.00    Years: 20.00    Pack years: 20.00    Quit date: 11/16/1986    Years since quitting: 33.5  . Smokeless tobacco: Never Used  Vaping Use  . Vaping Use: Never used  Substance and Sexual Activity  . Alcohol use: Yes    Alcohol/week: 1.0 - 2.0 standard drink    Types: 1 - 2 Glasses of wine per week    Comment: occ  . Drug use: No  . Sexual activity: Not on file  Other Topics Concern  . Not on file  Social History Narrative  . Not on file   Social Determinants of Health   Financial Resource Strain:   . Difficulty of Paying Living Expenses:   Food Insecurity:   . Worried About Charity fundraiser in the Last Year:   . Arboriculturist in the Last Year:   Transportation Needs:   . Film/video editor (Medical):   Marland Kitchen Lack of Transportation (Non-Medical):   Physical Activity:   . Days of Exercise per Week:   . Minutes of Exercise per Session:   Stress:   . Feeling of Stress :   Social Connections:   . Frequency of Communication with Friends and Family:   . Frequency of Social Gatherings with Friends and Family:   . Attends Religious Services:   . Active Member of Clubs or Organizations:   . Attends Archivist Meetings:   Marland Kitchen Marital Status:   Intimate Partner Violence:   . Fear of Current or Ex-Partner:   . Emotionally Abused:   Marland Kitchen Physically Abused:   . Sexually  Abused:     Outpatient Medications Prior to Visit  Medication Sig Dispense Refill  . amLODipine (NORVASC) 10 MG tablet Take 1 tablet (10 mg total) by mouth daily. 90 tablet 3  . atorvastatin (LIPITOR) 20 MG tablet TAKE 1 TABLET AT BEDTIME 90 tablet 1  . blood glucose meter kit and supplies KIT Use to test blood sugar up to twice a day. DX E11.09 1 each 0  . Cholecalciferol (VITAMIN D) 2000 units CAPS Take 1 capsule (2,000 Units total) by mouth every morning. 90 capsule 3  . Cyanocobalamin (VITAMIN B12 PO) Take 1 tablet by mouth daily.    Marland Kitchen glucose blood test strip Use as instructed to test blood sugar up to twice a day. DX:  E11.09 200 each 3  . Krill Oil 300 MG CAPS Take 500 mg by mouth daily.     . Lancets 57D MISC Delica Fine Lancets. Use to test blood sugar up to twice a day. DX E11.09 200 each 3  . lisinopril (ZESTRIL) 20 MG tablet Take 1 tablet (20 mg total) by mouth daily. 90 tablet 3  . magnesium oxide (MAG-OX) 400 MG tablet Take 1 tablet (400 mg total) by mouth 3 (three) times daily. 360 tablet 3  . metFORMIN (GLUCOPHAGE-XR) 500 MG 24 hr tablet Take 2 tablets twice daily with meals. 360 tablet 1  . Multiple Vitamins-Minerals (SENIOR MULTIVITAMIN PLUS) TABS Take 1 tablet by mouth daily.      Marland Kitchen omeprazole (PRILOSEC) 20 MG capsule Take 1 capsule (20 mg total) by mouth at bedtime. (Patient taking differently: Take 20 mg by mouth every other day. ) 90 capsule 3  . Probiotic Product (PROBIOTIC DAILY PO) Take by mouth.    . tamsulosin (FLOMAX) 0.4 MG CAPS capsule Take 1 capsule (0.4 mg total) by mouth at bedtime. (Patient taking differently: Take 0.4-0.8 mg by mouth See admin instructions. Take 0.4 mg by mouth daily. Starting 7 days before procedure take 0.49m by mouth daily) 14 capsule 0  . Triamcinolone Acetonide (NASACORT AQ NA) Place 1 spray into the nose daily as needed (allergies).    .Marland KitchenGLUCOSAMINE SULFATE PO Take 1 tablet by mouth daily.     Facility-Administered Medications Prior to  Visit  Medication Dose Route Frequency Provider Last Rate Last Admin  . 0.9 %  sodium chloride infusion  500 mL Intravenous Once GSilvano Rusk  E, MD        Allergies  Allergen Reactions  . Nsaids     Kidney Disease  . Sulfa Antibiotics Other (See Comments)    As child    Not sure rxn  . Thiazide-Type Diuretics Other (See Comments)    Unknown    ROS Review of Systems  Constitutional: Negative.   HENT: Negative.   Respiratory: Negative.   Cardiovascular: Negative.   Gastrointestinal: Negative.   Endocrine: Negative for polyphagia and polyuria.  Genitourinary: Negative for difficulty urinating, frequency and hematuria.  Musculoskeletal: Negative for gait problem and joint swelling.  Skin: Negative for pallor and rash.  Neurological: Negative for syncope and speech difficulty.  Psychiatric/Behavioral: Negative.       Objective:    Physical Exam Vitals and nursing note reviewed.  Constitutional:      General: He is not in acute distress.    Appearance: Normal appearance. He is not ill-appearing or toxic-appearing.  HENT:     Head: Normocephalic and atraumatic.     Right Ear: External ear normal.     Left Ear: External ear normal.  Eyes:     General: No scleral icterus.       Right eye: No discharge.        Left eye: No discharge.     Extraocular Movements: Extraocular movements intact.     Conjunctiva/sclera: Conjunctivae normal.  Pulmonary:     Effort: Pulmonary effort is normal.  Neurological:     Mental Status: He is alert and oriented to person, place, and time.  Psychiatric:        Mood and Affect: Mood normal.        Behavior: Behavior normal.     Ht _0  (1.803 m)   Wt 197 lb (89.4 kg)   BMI 27.48 kg/m  Wt Readings from Last 3 Encounters:  05/14/20 197 lb (89.4 kg)  05/01/20 198 lb 3.2 oz (89.9 kg)  11/29/19 199 lb 6.4 oz (90.4 kg)     Health Maintenance Due  Topic Date Due  . FOOT EXAM  09/27/2019    There are no preventive care reminders  to display for this patient.  Lab Results  Component Value Date   TSH 2.700 07/05/2019   Lab Results  Component Value Date   WBC 5.1 05/01/2020   HGB 12.5 (L) 05/01/2020   HCT 37.5 (L) 05/01/2020   MCV 95.7 05/01/2020   PLT 209.0 05/01/2020   Lab Results  Component Value Date   NA 141 05/01/2020   K 5.0 05/01/2020   CO2 26 05/01/2020   GLUCOSE 150 (H) 05/01/2020   BUN 32 (H) 05/01/2020   CREATININE 1.59 (H) 05/01/2020   BILITOT 0.3 05/01/2020   ALKPHOS 90 05/01/2020   AST 15 05/01/2020   ALT 13 05/01/2020   PROT 6.4 05/01/2020   ALBUMIN 4.3 05/01/2020   CALCIUM 9.0 05/01/2020   ANIONGAP 5 07/10/2016   GFR 41.99 (L) 05/01/2020   Lab Results  Component Value Date   CHOL 170 05/01/2020   Lab Results  Component Value Date   HDL 34.80 (L) 05/01/2020   Lab Results  Component Value Date   LDLCALC 101 (H) 05/01/2020   Lab Results  Component Value Date   TRIG 172.0 (H) 05/01/2020   Lab Results  Component Value Date   CHOLHDL 5 05/01/2020   Lab Results  Component Value Date   HGBA1C 7.7 Repeated and verified X2. (H) 05/01/2020      Assessment &  Plan:   Problem List Items Addressed This Visit      Cardiovascular and Mediastinum   stage 3 chronic kidney disease due to type 2 diabetes mellitus ([place]) (Chronic)   Relevant Medications   dapagliflozin propanediol (FARXIGA) 5 MG TABS tablet     Endocrine   Hyperlipidemia associated with type 2 diabetes mellitus ([facility]) (Chronic)   Relevant Medications   dapagliflozin propanediol (FARXIGA) 5 MG TABS tablet   Diabetes mellitus type II, controlled ([place]) - Primary   Relevant Medications   dapagliflozin propanediol (FARXIGA) 5 MG TABS tablet     Genitourinary   Stage 3b chronic kidney disease   Relevant Medications   dapagliflozin propanediol (FARXIGA) 5 MG TABS tablet     Other   B12 deficiency      Meds ordered this encounter  Medications  . dapagliflozin propanediol (FARXIGA) 5 MG TABS tablet    Sig:  Take 1 tablet (5 mg total) by mouth daily.    Dispense:  30 tablet    Refill:  2    Follow-up: Return in about 3 months (around 08/14/2020).  Agrees to exercise more by walking and increase hydration with water.  Will be more mindful of carbohydrate intake and cholesterol and fat in his diet.  Agrees to give Wilder Glade a try.  Will take the full 500 mg B12 pill.  Prostate biopsy may be pending.  Libby Maw, MD  Virtual Visit via Video Note  I connected with Nathan Gomez on 05/14/20 at  9:00 AM EDT by a video enabled telemedicine application and verified that I am speaking with the correct person using two identifiers.  Location: Patient: alone in his car.  Provider:   I discussed the limitations of evaluation and management by telemedicine and the availability of in person appointments. The patient expressed understanding and agreed to proceed.  History of Present Illness:    Observations/Objective:   Assessment and Plan:   Follow Up Instructions:    I discussed the assessment and treatment plan with the patient. The patient was provided an opportunity to ask questions and all were answered. The patient agreed with the plan and demonstrated an understanding of the instructions.   The patient was advised to call back or seek an in-person evaluation if the symptoms worsen or if the condition fails to improve as anticipated.  I provided 25 minutes of non-face-to-face time during this encounter.   Libby Maw, MD

## 2020-05-15 ENCOUNTER — Encounter: Payer: Self-pay | Admitting: Family Medicine

## 2020-05-16 NOTE — Telephone Encounter (Signed)
No unfortunately, there is not. I am prescribing that medicine for your diabetes and for your kidneys. It would be a good medicine for you, if you are able to afford it.

## 2020-05-27 ENCOUNTER — Encounter: Payer: Self-pay | Admitting: Family Medicine

## 2020-05-27 DIAGNOSIS — E1129 Type 2 diabetes mellitus with other diabetic kidney complication: Secondary | ICD-10-CM

## 2020-05-28 MED ORDER — FREESTYLE LIBRE 14 DAY SENSOR MISC: 1.0000 | 5 refills | Status: DC

## 2020-05-28 MED ORDER — FREESTYLE LIBRE 14 DAY READER DEVI: 1.0000 | 2 refills | Status: AC

## 2020-05-28 NOTE — Telephone Encounter (Signed)
Done

## 2020-05-31 ENCOUNTER — Ambulatory Visit: Payer: Medicare Other | Admitting: Family Medicine

## 2020-06-03 ENCOUNTER — Ambulatory Visit: Payer: Medicare Other | Admitting: Family Medicine

## 2020-06-05 ENCOUNTER — Ambulatory Visit (INDEPENDENT_AMBULATORY_CARE_PROVIDER_SITE_OTHER): Payer: Medicare Other

## 2020-06-05 ENCOUNTER — Other Ambulatory Visit: Payer: Self-pay

## 2020-06-05 ENCOUNTER — Encounter (HOSPITAL_COMMUNITY): Payer: Self-pay

## 2020-06-05 ENCOUNTER — Ambulatory Visit (HOSPITAL_COMMUNITY)
Admission: EM | Admit: 2020-06-05 | Discharge: 2020-06-05 | Disposition: A | Discharge status: Home / Self Care | Payer: Medicare Other | Attending: Urgent Care | Admitting: Urgent Care

## 2020-06-05 VITALS — Ht 71.0 in | Wt 192.0 lb

## 2020-06-05 DIAGNOSIS — Z0000 Encounter for general adult medical examination without abnormal findings: Secondary | ICD-10-CM

## 2020-06-05 DIAGNOSIS — I10 Essential (primary) hypertension: Secondary | ICD-10-CM

## 2020-06-05 DIAGNOSIS — E785 Hyperlipidemia, unspecified: Secondary | ICD-10-CM | POA: Diagnosis not present

## 2020-06-05 DIAGNOSIS — E119 Type 2 diabetes mellitus without complications: Secondary | ICD-10-CM | POA: Diagnosis not present

## 2020-06-05 DIAGNOSIS — R000 Tachycardia, unspecified: Secondary | ICD-10-CM

## 2020-06-05 DIAGNOSIS — R42 Dizziness and giddiness: Secondary | ICD-10-CM | POA: Diagnosis not present

## 2020-06-05 LAB — BASIC METABOLIC PANEL
Anion gap: 10 (ref 5–15)
BUN: 29 mg/dL — ABNORMAL HIGH (ref 8–23)
CO2: 22 mmol/L (ref 22–32)
Calcium: 9.8 mg/dL (ref 8.9–10.3)
Chloride: 103 mmol/L (ref 98–111)
Creatinine, Ser: 1.62 mg/dL — ABNORMAL HIGH (ref 0.61–1.24)
GFR calc Af Amer: 45 mL/min — ABNORMAL LOW (ref >60)
GFR calc non Af Amer: 39 mL/min — ABNORMAL LOW (ref >60)
Glucose, Bld: 147 mg/dL — ABNORMAL HIGH (ref 70–99)
Potassium: 5 mmol/L (ref 3.5–5.1)
Sodium: 135 mmol/L (ref 135–145)

## 2020-06-05 LAB — POCT URINALYSIS DIP (DEVICE)
Bilirubin Urine: NEGATIVE
Glucose, UA: NEGATIVE mg/dL
Hgb urine dipstick: NEGATIVE
Ketones, ur: NEGATIVE mg/dL
Nitrite: NEGATIVE
Protein, ur: NEGATIVE mg/dL
Specific Gravity, Urine: 1.01 (ref 1.005–1.030)
Urobilinogen, UA: 0.2 mg/dL (ref 0.0–1.0)
pH: 5 (ref 5.0–8.0)

## 2020-06-05 LAB — CBG MONITORING, ED: Glucose-Capillary: 151 mg/dL — ABNORMAL HIGH (ref 70–99)

## 2020-06-05 NOTE — ED Provider Notes (Signed)
[place]   MRN: [id number] DOB: 10-10-1941  Subjective:   Nathan Gomez is a 79 y.o. male presenting for 10-day history of persistent intermittent dizziness and racing heartbeat since starting [place] on 05/22/2020.  He contacted his PCP today and was advised to stop for possible reaction and to come in and get checked out.  Patient does have a history of diabetes and has decent control, gets regular follow-up with his PCP.  Has a history of stage III kidney disease, hypertension and hyperlipidemia.  Patient does not have a cardiologist.   Current Facility-Administered Medications:  .  0.9 %  sodium chloride infusion, 500 mL, Intravenous, Once, Gatha Mayer, MD  Current Outpatient Medications:  .  amLODipine (NORVASC) 10 MG tablet, Take 1 tablet (10 mg total) by mouth daily., Disp: 90 tablet, Rfl: 3 .  atorvastatin (LIPITOR) 20 MG tablet, TAKE 1 TABLET AT BEDTIME, Disp: 90 tablet, Rfl: 1 .  blood glucose meter kit and supplies KIT, Use to test blood sugar up to twice a day. DX E11.09, Disp: 1 each, Rfl: 0 .  Cholecalciferol (VITAMIN D) 2000 units CAPS, Take 1 capsule (2,000 Units total) by mouth every morning., Disp: 90 capsule, Rfl: 3 .  Continuous Blood Gluc Receiver (FREESTYLE LIBRE 14 DAY READER) DEVI, 1 Device by Does not apply route as directed., Disp: 1 each, Rfl: 2 .  Continuous Blood Gluc Sensor (FREESTYLE LIBRE 14 DAY SENSOR) MISC, 1 Device by Does not apply route every 14 (fourteen) days., Disp: 5 each, Rfl: 5 .  Cyanocobalamin (VITAMIN B12 PO), Take 1 tablet by mouth daily., Disp: , Rfl:  .  dapagliflozin propanediol (FARXIGA) 5 MG TABS tablet, Take 1 tablet (5 mg total) by mouth daily. (Patient not taking: Reported on 06/05/2020), Disp: 30 tablet, Rfl: 2 .  glucose blood test strip, Use as instructed to test blood sugar up to twice a day. DX:  E11.09, Disp: 200 each, Rfl: 3 .  Krill Oil 300 MG CAPS, Take 500 mg by mouth daily. , Disp: , Rfl:  .  Lancets 10U MISC,  Delica Fine Lancets. Use to test blood sugar up to twice a day. DX E11.09, Disp: 200 each, Rfl: 3 .  lisinopril (ZESTRIL) 20 MG tablet, Take 1 tablet (20 mg total) by mouth daily., Disp: 90 tablet, Rfl: 3 .  magnesium oxide (MAG-OX) 400 MG tablet, Take 1 tablet (400 mg total) by mouth 3 (three) times daily., Disp: 360 tablet, Rfl: 3 .  metFORMIN (GLUCOPHAGE-XR) 500 MG 24 hr tablet, Take 2 tablets twice daily with meals., Disp: 360 tablet, Rfl: 1 .  Multiple Vitamins-Minerals (SENIOR MULTIVITAMIN PLUS) TABS, Take 1 tablet by mouth daily.  , Disp: , Rfl:  .  omeprazole (PRILOSEC) 20 MG capsule, Take 1 capsule (20 mg total) by mouth at bedtime. (Patient taking differently: Take 20 mg by mouth every other day. ), Disp: 90 capsule, Rfl: 3 .  Probiotic Product (PROBIOTIC DAILY PO), Take by mouth., Disp: , Rfl:  .  tamsulosin (FLOMAX) 0.4 MG CAPS capsule, Take 1 capsule (0.4 mg total) by mouth at bedtime. (Patient taking differently: Take 0.4-0.8 mg by mouth See admin instructions. Take 0.4 mg by mouth daily. Starting 7 days before procedure take 0.35m by mouth daily), Disp: 14 capsule, Rfl: 0 .  Triamcinolone Acetonide (NASACORT AQ NA), Place 1 spray into the nose daily as needed (allergies)., Disp: , Rfl:    Allergies  Allergen Reactions  . Nsaids     Kidney Disease  .  Sulfa Antibiotics Other (See Comments)    As child    Not sure rxn  . Thiazide-Type Diuretics Other (See Comments)    Unknown    Past Medical History:  Diagnosis Date  . Achilles tendinitis   . Allergy   . Cataract   . Chronic kidney disease   . Complication of anesthesia    urinary retention  . 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 Diabetes mellitus, type 2 ([place])   . Dyslipidemia   . GERD (gastroesophageal reflux disease)   . Heart murmur    child  . Hyperlipidemia   . Hypertension   . Osteoarthritis    right knee  . Personal history of colonic adenomas 01/16/2013  . Plantar fasciitis      Past  Surgical History:  Procedure Laterality Date  . CATARACT EXTRACTION  08/2012 L, 09/2012 R  . COLONOSCOPY    . TONSILLECTOMY  1945  . TONSILLECTOMY    . TOTAL HIP ARTHROPLASTY  2011   Left  . TOTAL HIP ARTHROPLASTY Right 07/09/2015   Procedure: TOTAL HIP ARTHROPLASTY ANTERIOR APPROACH;  Surgeon: Melrose Nakayama, MD;  Location: [facility];  Service: Orthopedics;  Laterality: Right;  . TOTAL KNEE ARTHROPLASTY Right 07/21/2016   Procedure: RIGHT TOTAL KNEE ARTHROPLASTY;  Surgeon: Melrose Nakayama, MD;  Location: [facility];  Service: Orthopedics;  Laterality: Right;    Family History  Problem Relation Age of Onset  . Ovarian cancer Mother   . Hypertension Father   . Diabetes Maternal Grandfather   . Pneumonia Paternal Grandfather   . Colon cancer Neg Hx   . Esophageal cancer Neg Hx   . Liver cancer Neg Hx   . Pancreatic cancer Neg Hx   . Rectal cancer Neg Hx   . Stomach cancer Neg Hx     Social History   Tobacco Use  . Smoking status: Former Smoker    Packs/day: 1.00    Years: 20.00    Pack years: 20.00    Quit date: 11/16/1986    Years since quitting: 33.5  . Smokeless tobacco: Never Used  Vaping Use  . Vaping Use: Never used  Substance Use Topics  . Alcohol use: Yes    Alcohol/week: 1.0 - 2.0 standard drink    Types: 1 - 2 Glasses of wine per week    Comment: occ  . Drug use: No    ROS   Objective:   Vitals: BP 115/73 (BP Location: Right Arm)   Pulse 79   Temp 98.2 F (36.8 C) (Oral)   Resp 16   SpO2 97%   Physical Exam Constitutional:      General: He is not in acute distress.    Appearance: Normal appearance. He is well-developed. He is not ill-appearing, toxic-appearing or diaphoretic.  HENT:     Head: Normocephalic and atraumatic.     Right Ear: External ear normal.     Left Ear: External ear normal.     Nose: Nose normal.     Mouth/Throat:     Mouth: Mucous membranes are moist.     Pharynx: Oropharynx is clear.  Eyes:     General: No scleral icterus.        Right eye: No discharge.        Left eye: No discharge.     Extraocular Movements: Extraocular movements intact.     Conjunctiva/sclera: Conjunctivae normal.     Pupils: Pupils are equal, round, and reactive to light.  Neck:  Vascular: No carotid bruit.  Cardiovascular:     Rate and Rhythm: Normal rate and regular rhythm.     Heart sounds: Normal heart sounds. No murmur heard.  No friction rub. No gallop.   Pulmonary:     Effort: Pulmonary effort is normal. No respiratory distress.     Breath sounds: Normal breath sounds. No stridor. No wheezing, rhonchi or rales.  Abdominal:     Tenderness: There is no right CVA tenderness or left CVA tenderness.  Musculoskeletal:     Cervical back: Normal range of motion and neck supple. No tenderness.     Right lower leg: Edema (trace, chronic) present.     Left lower leg: Edema (trace, chronic) present.  Skin:    General: Skin is warm and dry.  Neurological:     Mental Status: He is alert and oriented to person, place, and time.     Cranial Nerves: No cranial nerve deficit.     Motor: No weakness.     Coordination: Coordination normal.     Gait: Gait normal.     Deep Tendon Reflexes: Reflexes normal.  Psychiatric:        Mood and Affect: Mood normal.        Behavior: Behavior normal.        Thought Content: Thought content normal.        Judgment: Judgment normal.     Results for orders placed or performed during the hospital encounter of 06/05/20 (from the past 24 hour(s))  POC CBG monitoring     Status: Abnormal   Collection Time: 06/05/20  1:07 PM  Result Value Ref Range   Glucose-Capillary 151 (H) 70 - 99 mg/dL  POCT urinalysis dip (device)     Status: Abnormal   Collection Time: 06/05/20  1:39 PM  Result Value Ref Range   Glucose, UA NEGATIVE NEGATIVE mg/dL   Bilirubin Urine NEGATIVE NEGATIVE   Ketones, ur NEGATIVE NEGATIVE mg/dL   Specific Gravity, Urine 1.010 1.005 - 1.030   Hgb urine dipstick NEGATIVE NEGATIVE   pH 5.0  5.0 - 8.0   Protein, ur NEGATIVE NEGATIVE mg/dL   Urobilinogen, UA 0.2 0.0 - 1.0 mg/dL   Nitrite NEGATIVE NEGATIVE   Leukocytes,Ua TRACE (A) NEGATIVE   ED ECG REPORT   Date: 06/05/2020  Rate: 82bpm  Rhythm: normal sinus rhythm  QRS Axis: normal  Intervals: normal  ST/T Wave abnormalities: normal  Conduction Disutrbances:none  Narrative Interpretation: Sinus rhythm at 82bpm, unchanged from previous ecg.  Old EKG Reviewed: unchanged  I have personally reviewed the EKG tracing and agree with the computerized printout as noted.   Assessment and Plan :   PDMP not reviewed this encounter.  1. Dizziness   2. Racing heart beat   3. Well controlled diabetes mellitus ([place])   4. Essential hypertension   5. Dyslipidemia     Patient has very reassuring physical exam findings, vital signs, EKG and urinalysis.  Basic metabolic panel pending.  Suspect side effect from [place].  Recommended holding off on using this medication again until he can follow-up with his PCP. Counseled patient on potential for adverse effects with medications prescribed/recommended today, ER and return-to-clinic precautions discussed, patient verbalized understanding.    Jaynee Eagles, PA-C 06/05/20 1400

## 2020-06-05 NOTE — ED Triage Notes (Signed)
Pt presents to UC for dizziness, racing heart since 7/12. Pt began taking farxiga on 7/7, PCP instructed to stop, for possible reaction. Symptoms still persist intermittently. Pt instructed by PCP to present to UC. Pt is asymptomatic at this time. Last episode of dizziness/racing heart at 7/20 PM.

## 2020-06-05 NOTE — Progress Notes (Signed)
Subjective:   Nathan Gomez is a 79 y.o. male who presents for Medicare Annual/Subsequent preventive examination.   I connected with Jared today by telephone and verified that I am speaking with the correct person using two identifiers. Location patient: home Location provider: work Persons participating in the virtual visit: patient, Marine scientist.    I discussed the limitations, risks, security and privacy concerns of performing an evaluation and management service by telephone and the availability of in person appointments. I also discussed with the patient that there may be a patient responsible charge related to this service. The patient expressed understanding and verbally consented to this telephonic visit.    Interactive audio and video telecommunications were attempted between this provider and patient, however failed, due to patient having technical difficulties OR patient did not have access to video capability.  We continued and completed visit with audio only.  Some vital signs may be absent or patient reported.   Time Spent with patient on telephone encounter: 25 minutes  Review of Systems:   Cardiac Risk Factors include: diabetes mellitus;dyslipidemia;hypertension;male gender     Objective:    Vitals: Ht _0  (1.803 m)   Wt 192 lb (87.1 kg)   BMI 26.78 kg/m   Body mass index is 26.78 kg/m.  Advanced Directives 06/05/2020 05/13/2017 04/08/2017 07/21/2016 07/10/2016 07/09/2015  Does Patient Have a Medical Advance Directive? Yes Yes Yes - Yes Yes  Type of Advance Directive [place];Living will - [place];Living will - [place];Living will Living will  Does patient want to make changes to medical advance directive? - - - - No - Patient declined No - Patient declined  Copy of [place] in Chart? Yes - validated most recent copy scanned in chart (See row information) - - Yes No - copy requested Yes      Tobacco Social History   Tobacco Use  Smoking Status Former Smoker  . Packs/day: 1.00  . Years: 20.00  . Pack years: 20.00  . Quit date: 11/16/1986  . Years since quitting: 33.5  Smokeless Tobacco Never Used     Counseling given: Not Answered   Clinical Intake:  Pre-visit preparation completed: Yes  Pain : 0-10 Pain Score: 2  Pain Type: Acute pain Pain Location: Head Pain Descriptors / Indicators: Sore Pain Onset: In the past 7 days Pain Frequency: Constant     Nutritional Status: BMI 25 -29 Overweight Nutritional Risks: None Diabetes: Yes CBG done?: No Did pt. bring in CBG monitor from home?: No  What is the last grade level you completed in school?: Graduate school  Nutrition Risk Assessment:  Has the patient had any N/V/D within the last 2 months?  No  Does the patient have any non-healing wounds?  No  Has the patient had any unintentional weight loss or weight gain?  No   Diabetes:  Is the patient diabetic?  Yes  If diabetic, was a CBG obtained today?  No  Did the patient bring in their glucometer from home?  No virtual visit How often do you monitor your CBG's? Several times per week.   Financial Strains and Diabetes Management:  Are you having any financial strains with the device, your supplies or your medication? No .  Does the patient want to be seen by Chronic Care Management for management of their diabetes?  No  Would the patient like to be referred to a Nutritionist or for Diabetic Management?  No  Diabetic Exams:  Diabetic Eye Exam: Completed 08/11/2019.   Diabetic Foot Exam: Pt has been advised about the importance in completing this exam. PCP to complete  Interpreter Needed?: No  Information entered by :: Caroleen Hamman LPN  Past Medical History:  Diagnosis Date  . Achilles tendinitis   . Allergy   . Cataract   . Chronic kidney disease   . Complication of anesthesia    urinary retention  . Degenerative tear of left medial  meniscus 11/2014  . Diabetes mellitus, type 2 ([place])   . Dyslipidemia   . GERD (gastroesophageal reflux disease)   . Heart murmur    child  . Hyperlipidemia   . Hypertension   . Osteoarthritis    right knee  . Personal history of colonic adenomas 01/16/2013  . Plantar fasciitis    Past Surgical History:  Procedure Laterality Date  . CATARACT EXTRACTION  08/2012 L, 09/2012 R  . COLONOSCOPY    . TONSILLECTOMY  1945  . TONSILLECTOMY    . TOTAL HIP ARTHROPLASTY  2011   Left  . TOTAL HIP ARTHROPLASTY Right 07/09/2015   Procedure: TOTAL HIP ARTHROPLASTY ANTERIOR APPROACH;  Surgeon: Melrose Nakayama, MD;  Location: [facility];  Service: Orthopedics;  Laterality: Right;  . TOTAL KNEE ARTHROPLASTY Right 07/21/2016   Procedure: RIGHT TOTAL KNEE ARTHROPLASTY;  Surgeon: Melrose Nakayama, MD;  Location: [facility];  Service: Orthopedics;  Laterality: Right;   Family History  Problem Relation Age of Onset  . Ovarian cancer Mother   . Hypertension Father   . Diabetes Maternal Grandfather   . Pneumonia Paternal Grandfather   . Colon cancer Neg Hx   . Esophageal cancer Neg Hx   . Liver cancer Neg Hx   . Pancreatic cancer Neg Hx   . Rectal cancer Neg Hx   . Stomach cancer Neg Hx    Social History   Socioeconomic History  . Marital status: Married    Spouse name: Not on file  . Number of children: Not on file  . Years of education: Not on file  . Highest education level: Not on file  Occupational History  . Occupation: Retired  Tobacco Use  . Smoking status: Former Smoker    Packs/day: 1.00    Years: 20.00    Pack years: 20.00    Quit date: 11/16/1986    Years since quitting: 33.5  . Smokeless tobacco: Never Used  Vaping Use  . Vaping Use: Never used  Substance and Sexual Activity  . Alcohol use: Yes    Alcohol/week: 1.0 - 2.0 standard drink    Types: 1 - 2 Glasses of wine per week    Comment: occ  . Drug use: No  . Sexual activity: Not on file  Other Topics Concern  . Not on file  Social  History Narrative  . Not on file   Social Determinants of Health   Financial Resource Strain: Low Risk   . Difficulty of Paying Living Expenses: Not hard at all  Food Insecurity: No Food Insecurity  . Worried About Charity fundraiser in the Last Year: Never true  . Ran Out of Food in the Last Year: Never true  Transportation Needs: No Transportation Needs  . Lack of Transportation (Medical): No  . Lack of Transportation (Non-Medical): No  Physical Activity: Insufficiently Active  . Days of Exercise per Week: 7 days  . Minutes of Exercise per Session: 20 min  Stress: No Stress Concern Present  . Feeling of  Stress : Not at all  Social Connections: Socially Integrated  . Frequency of Communication with Friends and Family: More than three times a week  . Frequency of Social Gatherings with Friends and Family: More than three times a week  . Attends Religious Services: More than 4 times per year  . Active Member of Clubs or Organizations: Yes  . Attends Archivist Meetings: More than 4 times per year  . Marital Status: Married    Outpatient Encounter Medications as of 06/05/2020  Medication Sig  . amLODipine (NORVASC) 10 MG tablet Take 1 tablet (10 mg total) by mouth daily.  Marland Kitchen atorvastatin (LIPITOR) 20 MG tablet TAKE 1 TABLET AT BEDTIME  . blood glucose meter kit and supplies KIT Use to test blood sugar up to twice a day. DX E11.09  . Cholecalciferol (VITAMIN D) 2000 units CAPS Take 1 capsule (2,000 Units total) by mouth every morning.  . Continuous Blood Gluc Receiver (FREESTYLE LIBRE 14 DAY READER) DEVI 1 Device by Does not apply route as directed.  . Continuous Blood Gluc Sensor (FREESTYLE LIBRE 14 DAY SENSOR) MISC 1 Device by Does not apply route every 14 (fourteen) days.  . Cyanocobalamin (VITAMIN B12 PO) Take 1 tablet by mouth daily.  Marland Kitchen glucose blood test strip Use as instructed to test blood sugar up to twice a day. DX:  E11.09  . Krill Oil 300 MG CAPS Take 500 mg by  mouth daily.   . Lancets 96E MISC Delica Fine Lancets. Use to test blood sugar up to twice a day. DX E11.09  . lisinopril (ZESTRIL) 20 MG tablet Take 1 tablet (20 mg total) by mouth daily.  . magnesium oxide (MAG-OX) 400 MG tablet Take 1 tablet (400 mg total) by mouth 3 (three) times daily.  . metFORMIN (GLUCOPHAGE-XR) 500 MG 24 hr tablet Take 2 tablets twice daily with meals.  . Multiple Vitamins-Minerals (SENIOR MULTIVITAMIN PLUS) TABS Take 1 tablet by mouth daily.    Marland Kitchen omeprazole (PRILOSEC) 20 MG capsule Take 1 capsule (20 mg total) by mouth at bedtime. (Patient taking differently: Take 20 mg by mouth every other day. )  . Probiotic Product (PROBIOTIC DAILY PO) Take by mouth.  . tamsulosin (FLOMAX) 0.4 MG CAPS capsule Take 1 capsule (0.4 mg total) by mouth at bedtime. (Patient taking differently: Take 0.4-0.8 mg by mouth See admin instructions. Take 0.4 mg by mouth daily. Starting 7 days before procedure take 0.9m by mouth daily)  . Triamcinolone Acetonide (NASACORT AQ NA) Place 1 spray into the nose daily as needed (allergies).  . dapagliflozin propanediol (FARXIGA) 5 MG TABS tablet Take 1 tablet (5 mg total) by mouth daily. (Patient not taking: Reported on 06/05/2020)  . [DISCONTINUED] GLUCOSAMINE SULFATE PO Take 1 tablet by mouth daily.   Facility-Administered Encounter Medications as of 06/05/2020  Medication  . 0.9 %  sodium chloride infusion    Activities of Daily Living In your present state of health, do you have any difficulty performing the following activities: 06/05/2020 11/29/2019  Hearing? N N  Vision? N N  Difficulty concentrating or making decisions? N N  Walking or climbing stairs? N N  Dressing or bathing? N N  Doing errands, shopping? N N  Preparing Food and eating ? N -  Using the Toilet? N -  In the past six months, have you accidently leaked urine? N -  Do you have problems with loss of bowel control? N -  Managing your Medications? N -  Managing your  Finances? N  -  Housekeeping or managing your Housekeeping? N -  Some recent data might be hidden    Patient Care Team: Libby Maw, MD as PCP - General (Family Medicine) Gatha Mayer, MD as Consulting Physician (Gastroenterology) Darleen Crocker, MD (Ophthalmology) Melrose Nakayama, MD (Orthopedic Surgery) Kathie Rhodes, MD (Urology) Katy Apo, MD as Consulting Physician (Ophthalmology) Arlyss Gandy, PA-C (Dermatology)   Assessment:   This is a routine wellness examination for [place].  Exercise Activities and Dietary recommendations Current Exercise Habits: Home exercise routine, Type of exercise: walking, Time (Minutes): 20, Frequency (Times/Week): 7, Weekly Exercise (Minutes/Week): 140, Intensity: Mild, Exercise limited by: None identified  Goals Addressed            This Visit's Progress   . Patient Stated       Increase walking and lose weight        Fall Risk Fall Risk  06/05/2020 05/01/2020 11/29/2019 06/15/2019 04/13/2018  Falls in the past year? 0 0 0 0 No  Comment - - - Emmi Telephone Survey: data to providers prior to load -  Number falls in past yr: 0 - 0 - -  Injury with Fall? 0 - 0 - -  Follow up Falls prevention discussed - Falls evaluation completed - -   FALL RISK PREVENTION PERTAINING TO THE HOME:  Any stairs in or around the home? Yes  If so, are there any without handrails? No   Home free of loose throw rugs in walkways, pet beds, electrical cords, etc? Yes  Adequate lighting in your home to reduce risk of falls? Yes   ASSISTIVE DEVICES UTILIZED TO PREVENT FALLS:  Life alert? No  Use of a cane, walker or w/c? No  Grab bars in the bathroom? No  Shower chair or bench in shower? No  Elevated toilet seat or a handicapped toilet? No    TIMED UP AND GO:  Was the test performed? No . Virtual visit    Depression Screen PHQ 2/9 Scores 06/05/2020 05/01/2020 11/29/2019 08/15/2019  PHQ - 2 Score 1 1 0 1  PHQ- 9 Score - 2 0 1     Cognitive Function: No cognitive impairment noted     6CIT Screen 05/13/2017  What Year? 0 points  What month? 0 points  What time? 0 points  Count back from 20 0 points  Months in reverse 0 points  Repeat phrase 10 points  Total Score 10    Immunization History  Administered Date(s) Administered  . Fluad Quad(high Dose 65+) 08/15/2019  . Influenza Split 09/28/2011  . Influenza, High Dose Seasonal PF 07/12/2014, 07/30/2015, 08/25/2016, 09/13/2017, 08/12/2018  . Influenza, Seasonal, Injecte, Preservative Fre 07/17/2013  . PFIZER SARS-COV-2 Vaccination 12/23/2019, 01/17/2020  . Pneumococcal Conjugate-13 01/29/2015  . Pneumococcal Polysaccharide-23 08/01/2007  . Td 05/26/2012  . Zoster 03/01/2009  . Zoster Recombinat (Shingrix) 05/13/2017, 10/22/2017    Qualifies for Shingles Vaccine? Completed vaccines  Tdap: Up to Date  Flu Vaccine: Due 07/2020  Pneumococcal Vaccine:  Completed vaccines  Covid-19 Vaccine: Completed vaccines  Screening Tests Health Maintenance  Topic Date Due  . FOOT EXAM  09/27/2019  . INFLUENZA VACCINE  06/16/2020  . OPHTHALMOLOGY EXAM  08/10/2020  . HEMOGLOBIN A1C  10/31/2020  . TETANUS/TDAP  05/26/2022  . COLONOSCOPY  02/09/2023  . COVID-19 Vaccine  Completed  . PNA vac Low Risk Adult  Completed   Cancer Screenings:  Colorectal Screening: Completed 02/08/2018. No longer required.   Lung Cancer Screening: (Low Dose  CT Chest recommended if Age 40-80 years, 30 pack-year currently smoking OR have quit w/in 15years.) does not qualify.     Additional Screening:  Hepatitis C Screening: does not qualify  Dental Screening: Recommended annual dental exams for proper oral hygiene  Community Resource Referral:  CRR required this visit?  No        Plan:  I have personally reviewed and addressed the Medicare Annual Wellness questionnaire and have noted the following in the patient's chart:  A. Medical and social history B. Use of alcohol,  tobacco or illicit drugs  C. Current medications and supplements D. Functional ability and status E.  Nutritional status F.  Physical activity G. Advance directives H. List of other physicians I.  Hospitalizations, surgeries, and ER visits in previous 12 months J.  [place] such as hearing and vision if needed, cognitive and depression L. Referrals and appointments   In addition, I have reviewed and discussed with patient certain preventive protocols, quality metrics, and best practice recommendations. A written personalized care plan for preventive services as well as general preventive health recommendations were provided to patient.  Due to this being a telephonic visit, the after visit summary with patients personalized plan was offered to patient via mail or my-chart.  Patient would like to access on my-chart. Signed,   Marta Antu, LPN  07/11/4157 Nurse Health Advisor  Nurse Notes: Patient states he started having palpitations & dizziness a few weeks ago. He states it started after he started taking [place] Glade so he thought it may be coming from that. He stopped the [place] & says the symptoms got a little better but he is still having occasional dizziness & palpitations. Per office protocol patient directed to Triage nurse phone line.

## 2020-06-05 NOTE — Patient Instructions (Signed)
Mr. Nathan Gomez , Thank you for taking time to complete your Medicare Wellness Visit. I appreciate your ongoing commitment to your health goals. Please review the following plan we discussed and let me know if I can assist you in the future.   Screening recommendations/referrals: Colonoscopy: Completed 02/08/2018- No longer indicated Recommended yearly ophthalmology/optometry visit for glaucoma screening and checkup Recommended yearly dental visit for hygiene and checkup  Vaccinations: Influenza vaccine: Up to Date-Due 07/2020 Pneumococcal vaccine: Completed vaccines Tdap vaccine: Up to Date-Due 05/26/2022 Shingles vaccine: Completed vaccines  Covid-19: Completed vaccines  Advanced directives: Copy in chart  Conditions/risks identified: See problem list  Next appointment: Follow up in one year for your annual wellness visit.   Preventive Care 81 Years and Older, Male Preventive care refers to lifestyle choices and visits with your health care provider that can promote health and wellness. What does preventive care include?  A yearly physical exam. This is also called an annual well check.  Dental exams once or twice a year.  Routine eye exams. Ask your health care provider how often you should have your eyes checked.  Personal lifestyle choices, including:  Daily care of your teeth and gums.  Regular physical activity.  Eating a healthy diet.  Avoiding tobacco and drug use.  Limiting alcohol use.  Practicing safe sex.  Taking low doses of aspirin every day.  Taking vitamin and mineral supplements as recommended by your health care provider. What happens during an annual well check? The services and screenings done by your health care provider during your annual well check will depend on your age, overall health, lifestyle risk factors, and family history of disease. Counseling  Your health care provider may ask you questions about your:  Alcohol use.  Tobacco  use.  Drug use.  Emotional well-being.  Home and relationship well-being.  Sexual activity.  Eating habits.  History of falls.  Memory and ability to understand (cognition).  Work and work Statistician. Screening  You may have the following tests or measurements:  Height, weight, and BMI.  Blood pressure.  Lipid and cholesterol levels. These may be checked every 5 years, or more frequently if you are over 40 years old.  Skin check.  Lung cancer screening. You may have this screening every year starting at age 14 if you have a 30-pack-year history of smoking and currently smoke or have quit within the past 15 years.  Fecal occult blood test (FOBT) of the stool. You may have this test every year starting at age 20.  Flexible sigmoidoscopy or colonoscopy. You may have a sigmoidoscopy every 5 years or a colonoscopy every 10 years starting at age 49.  Prostate cancer screening. Recommendations will vary depending on your family history and other risks.  Hepatitis C blood test.  Hepatitis B blood test.  Sexually transmitted disease (STD) testing.  Diabetes screening. This is done by checking your blood sugar (glucose) after you have not eaten for a while (fasting). You may have this done every 1-3 years.  Abdominal aortic aneurysm (AAA) screening. You may need this if you are a current or former smoker.  Osteoporosis. You may be screened starting at age 85 if you are at high risk. Talk with your health care provider about your test results, treatment options, and if necessary, the need for more tests. Vaccines  Your health care provider may recommend certain vaccines, such as:  Influenza vaccine. This is recommended every year.  Tetanus, diphtheria, and acellular pertussis (Tdap, Td) vaccine.  You may need a Td booster every 10 years.  Zoster vaccine. You may need this after age 29.  Pneumococcal 13-valent conjugate (PCV13) vaccine. One dose is recommended after age  37.  Pneumococcal polysaccharide (PPSV23) vaccine. One dose is recommended after age 102. Talk to your health care provider about which screenings and vaccines you need and how often you need them. This information is not intended to replace advice given to you by your health care provider. Make sure you discuss any questions you have with your health care provider. Document Released: 11/29/2015 Document Revised: 07/22/2016 Document Reviewed: 09/03/2015 Elsevier Interactive Patient Education  2017 [place] Prevention in the Home Falls can cause injuries. They can happen to people of all ages. There are many things you can do to make your home safe and to help prevent falls. What can I do on the outside of my home?  Regularly fix the edges of walkways and driveways and fix any cracks.  Remove anything that might make you trip as you walk through a door, such as a raised step or threshold.  Trim any bushes or trees on the path to your home.  Use bright outdoor lighting.  Clear any walking paths of anything that might make someone trip, such as rocks or tools.  Regularly check to see if handrails are loose or broken. Make sure that both sides of any steps have handrails.  Any raised decks and porches should have guardrails on the edges.  Have any leaves, snow, or ice cleared regularly.  Use sand or salt on walking paths during winter.  Clean up any spills in your garage right away. This includes oil or grease spills. What can I do in the bathroom?  Use night lights.  Install grab bars by the toilet and in the tub and shower. Do not use towel bars as grab bars.  Use non-skid mats or decals in the tub or shower.  If you need to sit down in the shower, use a plastic, non-slip stool.  Keep the floor dry. Clean up any water that spills on the floor as soon as it happens.  Remove soap buildup in the tub or shower regularly.  Attach bath mats securely with double-sided  non-slip rug tape.  Do not have throw rugs and other things on the floor that can make you trip. What can I do in the bedroom?  Use night lights.  Make sure that you have a light by your bed that is easy to reach.  Do not use any sheets or blankets that are too big for your bed. They should not hang down onto the floor.  Have a firm chair that has side arms. You can use this for support while you get dressed.  Do not have throw rugs and other things on the floor that can make you trip. What can I do in the kitchen?  Clean up any spills right away.  Avoid walking on wet floors.  Keep items that you use a lot in easy-to-reach places.  If you need to reach something above you, use a strong step stool that has a grab bar.  Keep electrical cords out of the way.  Do not use floor polish or wax that makes floors slippery. If you must use wax, use non-skid floor wax.  Do not have throw rugs and other things on the floor that can make you trip. What can I do with my stairs?  Do not leave any  items on the stairs.  Make sure that there are handrails on both sides of the stairs and use them. Fix handrails that are broken or loose. Make sure that handrails are as long as the stairways.  Check any carpeting to make sure that it is firmly attached to the stairs. Fix any carpet that is loose or worn.  Avoid having throw rugs at the top or bottom of the stairs. If you do have throw rugs, attach them to the floor with carpet tape.  Make sure that you have a light switch at the top of the stairs and the bottom of the stairs. If you do not have them, ask someone to add them for you. What else can I do to help prevent falls?  Wear shoes that:  Do not have high heels.  Have rubber bottoms.  Are comfortable and fit you well.  Are closed at the toe. Do not wear sandals.  If you use a stepladder:  Make sure that it is fully opened. Do not climb a closed stepladder.  Make sure that both  sides of the stepladder are locked into place.  Ask someone to hold it for you, if possible.  Clearly mark and make sure that you can see:  Any grab bars or handrails.  First and last steps.  Where the edge of each step is.  Use tools that help you move around (mobility aids) if they are needed. These include:  Canes.  Walkers.  Scooters.  Crutches.  Turn on the lights when you go into a dark area. Replace any light bulbs as soon as they burn out.  Set up your furniture so you have a clear path. Avoid moving your furniture around.  If any of your floors are uneven, fix them.  If there are any pets around you, be aware of where they are.  Review your medicines with your doctor. Some medicines can make you feel dizzy. This can increase your chance of falling. Ask your doctor what other things that you can do to help prevent falls. This information is not intended to replace advice given to you by your health care provider. Make sure you discuss any questions you have with your health care provider. Document Released: 08/29/2009 Document Revised: 04/09/2016 Document Reviewed: 12/07/2014 Elsevier Interactive Patient Education  2017 [employer].

## 2020-06-19 ENCOUNTER — Other Ambulatory Visit: Payer: Self-pay

## 2020-06-20 ENCOUNTER — Encounter: Payer: Self-pay | Admitting: Family Medicine

## 2020-06-20 ENCOUNTER — Ambulatory Visit (INDEPENDENT_AMBULATORY_CARE_PROVIDER_SITE_OTHER): Payer: Medicare Other | Admitting: Family Medicine

## 2020-06-20 VITALS — BP 120/68 | HR 83 | Temp 96.4°F | Ht 71.0 in | Wt 194.6 lb

## 2020-06-20 DIAGNOSIS — E538 Deficiency of other specified B group vitamins: Secondary | ICD-10-CM | POA: Diagnosis not present

## 2020-06-20 DIAGNOSIS — E785 Hyperlipidemia, unspecified: Secondary | ICD-10-CM

## 2020-06-20 DIAGNOSIS — E1169 Type 2 diabetes mellitus with other specified complication: Secondary | ICD-10-CM | POA: Diagnosis not present

## 2020-06-20 DIAGNOSIS — E1129 Type 2 diabetes mellitus with other diabetic kidney complication: Secondary | ICD-10-CM | POA: Diagnosis not present

## 2020-06-20 DIAGNOSIS — D510 Vitamin B12 deficiency anemia due to intrinsic factor deficiency: Secondary | ICD-10-CM | POA: Diagnosis not present

## 2020-06-20 MED ORDER — BLOOD GLUCOSE MONITOR KIT: PACK | 0 refills | Status: AC

## 2020-06-20 MED ORDER — ATORVASTATIN CALCIUM 40 MG PO TABS: 40.0000 mg | ORAL_TABLET | Freq: Every day | ORAL | 3 refills | Status: DC

## 2020-06-20 NOTE — Progress Notes (Signed)
Established Patient Office Visit  Subjective:  Patient ID: Nathan Gomez, male    DOB: December 25, 1940  Age: 79 y.o. MRN: [id number]  CC:  Chief Complaint  Patient presents with  . Follow-up    follow up on medications, pt stopped taking Farxiga on 05/27/20 due to feeling dizzy when taking.     HPI Nathan Gomez presents for follow-up of his elevated hemoglobin A1c, low normal B12 level elevated ldl cholesterol.  Did not tolerate [place].  Cause dizziness.  He has been taking 500 mcg of B12.  Has been taking 20 Lipitor daily.  Past Medical History:  Diagnosis Date  . Achilles tendinitis   . Allergy   . Cataract   . Chronic kidney disease   . Complication of anesthesia    urinary retention  . 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 Diabetes mellitus, type 2 ([place])   . Dyslipidemia   . GERD (gastroesophageal reflux disease)   . Heart murmur    child  . Hyperlipidemia   . Hypertension   . Osteoarthritis    right knee  . Personal history of colonic adenomas 01/16/2013  . Plantar fasciitis     Past Surgical History:  Procedure Laterality Date  . CATARACT EXTRACTION  08/2012 L, 09/2012 R  . COLONOSCOPY    . TONSILLECTOMY  1945  . TONSILLECTOMY    . TOTAL HIP ARTHROPLASTY  2011   Left  . TOTAL HIP ARTHROPLASTY Right 07/09/2015   Procedure: TOTAL HIP ARTHROPLASTY ANTERIOR APPROACH;  Surgeon: Melrose Nakayama, MD;  Location: [facility];  Service: Orthopedics;  Laterality: Right;  . TOTAL KNEE ARTHROPLASTY Right 07/21/2016   Procedure: RIGHT TOTAL KNEE ARTHROPLASTY;  Surgeon: Melrose Nakayama, MD;  Location: [facility];  Service: Orthopedics;  Laterality: Right;    Family History  Problem Relation Age of Onset  . Ovarian cancer Mother   . Hypertension Father   . Diabetes Maternal Grandfather   . Pneumonia Paternal Grandfather   . Colon cancer Neg Hx   . Esophageal cancer Neg Hx   . Liver cancer Neg Hx   . Pancreatic cancer Neg Hx   . Rectal cancer Neg Hx   . Stomach  cancer Neg Hx     Social History   Socioeconomic History  . Marital status: Married    Spouse name: Not on file  . Number of children: Not on file  . Years of education: Not on file  . Highest education level: Not on file  Occupational History  . Occupation: Retired  Tobacco Use  . Smoking status: Former Smoker    Packs/day: 1.00    Years: 20.00    Pack years: 20.00    Quit date: 11/16/1986    Years since quitting: 33.6  . Smokeless tobacco: Never Used  Vaping Use  . Vaping Use: Never used  Substance and Sexual Activity  . Alcohol use: Yes    Alcohol/week: 1.0 - 2.0 standard drink    Types: 1 - 2 Glasses of wine per week    Comment: occ  . Drug use: No  . Sexual activity: Not on file  Other Topics Concern  . Not on file  Social History Narrative  . Not on file   Social Determinants of Health   Financial Resource Strain: Low Risk   . Difficulty of Paying Living Expenses: Not hard at all  Food Insecurity: No Food Insecurity  . Worried About Charity fundraiser in the Last Year: Never  true  . Ran Out of Food in the Last Year: Never true  Transportation Needs: No Transportation Needs  . Lack of Transportation (Medical): No  . Lack of Transportation (Non-Medical): No  Physical Activity: Insufficiently Active  . Days of Exercise per Week: 7 days  . Minutes of Exercise per Session: 20 min  Stress: No Stress Concern Present  . Feeling of Stress : Not at all  Social Connections: Socially Integrated  . Frequency of Communication with Friends and Family: More than three times a week  . Frequency of Social Gatherings with Friends and Family: More than three times a week  . Attends Religious Services: More than 4 times per year  . Active Member of Clubs or Organizations: Yes  . Attends Archivist Meetings: More than 4 times per year  . Marital Status: Married  Human resources officer Violence: Not At Risk  . Fear of Current or Ex-Partner: No  . Emotionally Abused: No    . Physically Abused: No  . Sexually Abused: No    Outpatient Medications Prior to Visit  Medication Sig Dispense Refill  . amLODipine (NORVASC) 10 MG tablet Take 1 tablet (10 mg total) by mouth daily. 90 tablet 3  . Cholecalciferol (VITAMIN D) 2000 units CAPS Take 1 capsule (2,000 Units total) by mouth every morning. 90 capsule 3  . Continuous Blood Gluc Receiver (FREESTYLE LIBRE 14 DAY READER) DEVI 1 Device by Does not apply route as directed. 1 each 2  . Continuous Blood Gluc Sensor (FREESTYLE LIBRE 14 DAY SENSOR) MISC 1 Device by Does not apply route every 14 (fourteen) days. 5 each 5  . Cyanocobalamin (VITAMIN B12 PO) Take 1 tablet by mouth daily.    Marland Kitchen glucose blood test strip Use as instructed to test blood sugar up to twice a day. DX:  E11.09 200 each 3  . Krill Oil 300 MG CAPS Take 500 mg by mouth daily.     . Lancets 16X MISC Delica Fine Lancets. Use to test blood sugar up to twice a day. DX E11.09 200 each 3  . lisinopril (ZESTRIL) 20 MG tablet Take 1 tablet (20 mg total) by mouth daily. 90 tablet 3  . magnesium oxide (MAG-OX) 400 MG tablet Take 1 tablet (400 mg total) by mouth 3 (three) times daily. 360 tablet 3  . metFORMIN (GLUCOPHAGE-XR) 500 MG 24 hr tablet Take 2 tablets twice daily with meals. 360 tablet 1  . Multiple Vitamins-Minerals (SENIOR MULTIVITAMIN PLUS) TABS Take 1 tablet by mouth daily.      Marland Kitchen omeprazole (PRILOSEC) 20 MG capsule Take 1 capsule (20 mg total) by mouth at bedtime. (Patient taking differently: Take 20 mg by mouth every other day. ) 90 capsule 3  . Probiotic Product (PROBIOTIC DAILY PO) Take by mouth.    . tamsulosin (FLOMAX) 0.4 MG CAPS capsule Take 1 capsule (0.4 mg total) by mouth at bedtime. (Patient taking differently: Take 0.4-0.8 mg by mouth See admin instructions. Take 0.4 mg by mouth daily. Starting 7 days before procedure take 0.38m by mouth daily) 14 capsule 0  . Triamcinolone Acetonide (NASACORT AQ NA) Place 1 spray into the nose daily as needed  (allergies).    .Marland Kitchenatorvastatin (LIPITOR) 20 MG tablet TAKE 1 TABLET AT BEDTIME 90 tablet 1  . blood glucose meter kit and supplies KIT Use to test blood sugar up to twice a day. DX E11.09 1 each 0  . dapagliflozin propanediol (FARXIGA) 5 MG TABS tablet Take 1 tablet (5  mg total) by mouth daily. (Patient not taking: Reported on 06/05/2020) 30 tablet 2   Facility-Administered Medications Prior to Visit  Medication Dose Route Frequency Provider Last Rate Last Admin  . 0.9 %  sodium chloride infusion  500 mL Intravenous Once Gatha Mayer, MD        Allergies  Allergen Reactions  . Nsaids     Kidney Disease  . Sulfa Antibiotics Other (See Comments)    As child    Not sure rxn  . Thiazide-Type Diuretics Other (See Comments)    Unknown    ROS Review of Systems  Constitutional: Negative.   Respiratory: Negative.   Cardiovascular: Negative.   Gastrointestinal: Negative.   Endocrine: Negative for polyphagia and polyuria.  Musculoskeletal: Negative.       Objective:    Physical Exam Vitals and nursing note reviewed.  Constitutional:      Appearance: Normal appearance.  HENT:     Head: Normocephalic and atraumatic.     Right Ear: External ear normal.     Left Ear: External ear normal.  Eyes:     General: No scleral icterus.       Right eye: No discharge.        Left eye: No discharge.     Conjunctiva/sclera: Conjunctivae normal.  Cardiovascular:     Rate and Rhythm: Normal rate and regular rhythm.  Pulmonary:     Effort: Pulmonary effort is normal.     Breath sounds: Normal breath sounds.  Neurological:     Mental Status: He is alert.  Psychiatric:        Mood and Affect: Mood normal.        Behavior: Behavior normal.     BP 120/68   Pulse 83   Temp (!) 96.4 F (35.8 C) (Tympanic)   Ht 5' 11"  (1.803 m)   Wt 194 lb 9.6 oz (88.3 kg)   SpO2 94%   BMI 27.14 kg/m  Wt Readings from Last 3 Encounters:  06/20/20 194 lb 9.6 oz (88.3 kg)  06/05/20 192 lb (87.1 kg)    05/14/20 197 lb (89.4 kg)     Health Maintenance Due  Topic Date Due  . FOOT EXAM  09/27/2019  . INFLUENZA VACCINE  06/16/2020    There are no preventive care reminders to display for this patient.  Lab Results  Component Value Date   TSH 2.700 07/05/2019   Lab Results  Component Value Date   WBC 5.1 05/01/2020   HGB 12.5 (L) 05/01/2020   HCT 37.5 (L) 05/01/2020   MCV 95.7 05/01/2020   PLT 209.0 05/01/2020   Lab Results  Component Value Date   NA 135 06/05/2020   K 5.0 06/05/2020   CO2 22 06/05/2020   GLUCOSE 147 (H) 06/05/2020   BUN 29 (H) 06/05/2020   CREATININE 1.62 (H) 06/05/2020   BILITOT 0.3 05/01/2020   ALKPHOS 90 05/01/2020   AST 15 05/01/2020   ALT 13 05/01/2020   PROT 6.4 05/01/2020   ALBUMIN 4.3 05/01/2020   CALCIUM 9.8 06/05/2020   ANIONGAP 10 06/05/2020   GFR 41.99 (L) 05/01/2020   Lab Results  Component Value Date   CHOL 170 05/01/2020   Lab Results  Component Value Date   HDL 34.80 (L) 05/01/2020   Lab Results  Component Value Date   LDLCALC 101 (H) 05/01/2020   Lab Results  Component Value Date   TRIG 172.0 (H) 05/01/2020   Lab Results  Component Value Date  CHOLHDL 5 05/01/2020   Lab Results  Component Value Date   HGBA1C 7.7 Repeated and verified X2. (H) 05/01/2020      Assessment & Plan:   Problem List Items Addressed This Visit      Endocrine   Hyperlipidemia associated with type 2 diabetes mellitus ([place]) (Chronic)   Relevant Medications   atorvastatin (LIPITOR) 40 MG tablet   Diabetes mellitus type II, controlled ([place]) - Primary   Relevant Medications   atorvastatin (LIPITOR) 40 MG tablet   blood glucose meter kit and supplies KIT     Other   Pernicious anemia   B12 deficiency      Meds ordered this encounter  Medications  . atorvastatin (LIPITOR) 40 MG tablet    Sig: Take 1 tablet (40 mg total) by mouth daily.    Dispense:  90 tablet    Refill:  3  . blood glucose meter kit and supplies KIT    Sig:  Use to test blood sugar up to twice a day. DX E11.09    Dispense:  1 each    Refill:  0    Dispense One Touch Ultra 2    Order Specific Question:   Number of strips    Answer:   100    Order Specific Question:   Number of lancets    Answer:   100    Follow-up: No follow-ups on file.   We will increase his B12 1,000 mcg daily.  We will continue 1000 Metformin twice daily increased exercise and decrease the carbs in his diet.  We have discontinued [place].  Will increase Lipitor to 40 mg daily.  He will follow-up in 3 months. Libby Maw, MD

## 2020-08-08 ENCOUNTER — Telehealth: Payer: Self-pay | Admitting: Gastroenterology

## 2020-08-08 NOTE — Telephone Encounter (Signed)
Pt is requesting a call back from a nurse to discuss his loose stools worsening.

## 2020-08-09 NOTE — Telephone Encounter (Signed)
Patient returned your call.

## 2020-08-09 NOTE — Telephone Encounter (Signed)
The pt has an appt on 10/20 with Jessica to discuss loose stools.  He is taking imodium only occasionally.  I advised him to take 1 every morning until appt with Janett Billow.  The pt agreed and will call back with any further concerns prior to his appt.

## 2020-08-09 NOTE — Telephone Encounter (Signed)
Left message on machine to call back  

## 2020-08-12 DIAGNOSIS — H26493 Other secondary cataract, bilateral: Secondary | ICD-10-CM | POA: Diagnosis not present

## 2020-08-12 DIAGNOSIS — E119 Type 2 diabetes mellitus without complications: Secondary | ICD-10-CM | POA: Diagnosis not present

## 2020-08-12 LAB — HM DIABETES EYE EXAM

## 2020-08-13 DIAGNOSIS — Z23 Encounter for immunization: Secondary | ICD-10-CM | POA: Diagnosis not present

## 2020-08-20 DIAGNOSIS — Z23 Encounter for immunization: Secondary | ICD-10-CM | POA: Diagnosis not present

## 2020-08-21 ENCOUNTER — Encounter: Payer: Self-pay | Admitting: Dermatology

## 2020-08-21 ENCOUNTER — Other Ambulatory Visit: Payer: Self-pay

## 2020-08-21 ENCOUNTER — Ambulatory Visit (INDEPENDENT_AMBULATORY_CARE_PROVIDER_SITE_OTHER): Payer: Medicare Other | Admitting: Dermatology

## 2020-08-21 DIAGNOSIS — L821 Other seborrheic keratosis: Secondary | ICD-10-CM

## 2020-08-21 DIAGNOSIS — L570 Actinic keratosis: Secondary | ICD-10-CM | POA: Diagnosis not present

## 2020-08-21 DIAGNOSIS — D3611 Benign neoplasm of peripheral nerves and autonomic nervous system of face, head, and neck: Secondary | ICD-10-CM

## 2020-08-21 DIAGNOSIS — D3610 Benign neoplasm of peripheral nerves and autonomic nervous system, unspecified: Secondary | ICD-10-CM

## 2020-08-21 DIAGNOSIS — Z1283 Encounter for screening for malignant neoplasm of skin: Secondary | ICD-10-CM | POA: Diagnosis not present

## 2020-08-21 NOTE — Patient Instructions (Addendum)
Routine follow-up for Nathan Gomez date of birth Jan 07, 1941.  Waist up skin examination showed no atypical moles, melanoma, or nonmole skin cancer.  He had an excellent response to using topical fluorouracil on his face 1 to 2 years ago but now has some recurrent actinic keratoses on the left forehead.  He may try just spot treating this every other night for 4weeks.  The 3 cm brown rough spot on the left wrist as well as 2 smaller spots on the left collarbone represent seborrheic keratoses and are safe to leave if clinically stable.  Two stuck out moles on the back of the left neck may actually be small neurofibromas and do not currently require intervention.  Routine follow-up 1 year.

## 2020-09-04 ENCOUNTER — Other Ambulatory Visit (INDEPENDENT_AMBULATORY_CARE_PROVIDER_SITE_OTHER): Payer: Medicare Other

## 2020-09-04 ENCOUNTER — Encounter: Payer: Self-pay | Admitting: Gastroenterology

## 2020-09-04 ENCOUNTER — Ambulatory Visit (INDEPENDENT_AMBULATORY_CARE_PROVIDER_SITE_OTHER): Payer: Medicare Other | Admitting: Gastroenterology

## 2020-09-04 VITALS — BP 130/80 | HR 84 | Ht 71.0 in | Wt 193.0 lb

## 2020-09-04 DIAGNOSIS — R197 Diarrhea, unspecified: Secondary | ICD-10-CM

## 2020-09-04 DIAGNOSIS — R143 Flatulence: Secondary | ICD-10-CM

## 2020-09-04 LAB — TSH: TSH: 4.31 u[IU]/mL (ref 0.35–4.50)

## 2020-09-04 MED ORDER — RIFAXIMIN 550 MG PO TABS
550.0000 mg | ORAL_TABLET | Freq: Three times a day (TID) | ORAL | 0 refills | Status: DC
Start: 2020-09-04 — End: 2020-09-06

## 2020-09-04 NOTE — Patient Instructions (Addendum)
If you are age 81 or older, your body mass index should be between 23-30. Your Body mass index is 26.92 kg/m. If this is out of the aforementioned range listed, please consider follow up with your Primary Care Provider.  If you are age 81 or younger, your body mass index should be between 19-25. Your Body mass index is 26.92 kg/m. If this is out of the aformentioned range listed, please consider follow up with your Primary Care Provider.   Your provider has requested that you go to the basement level for lab work before leaving today. Press "B" on the elevator. The lab is located at the first door on the left as you exit the elevator.  Call office back in 3-4 weeks with an update.

## 2020-09-04 NOTE — Progress Notes (Signed)
09/04/2020 Nathan Gomez [id number] 1941/06/18   HISTORY OF PRESENT ILLNESS: This is an 79 year old male who is a patient of Dr. Celesta Aver.  He was seen by me on October 25, 2019 for complaints of intermittent loose stools.  Please see my note from that date for further details.  Nonetheless, at that point he had only had a few episodes of this.  He returns here today with the same complaints, but is now having about one what he calls a "major episode" per month.  He says that usually the diarrhea always occurs first thing in the morning.  On a bad day or the start of an episode he will have 3-4 bowel movements and then nothing the rest of the day.  This eventually then tapers off.  He has fairly normal bowel movements in between these episodes.  He said he has cut out alcohol and dairy without improvement.  He says that the most aggravating thing is the unpredictability and urgency.  He denies seeing blood in his stool.  He denies pain, but does report a lot more gas/flatulence.  Last colonoscopy was March 2019 at which time it was completely normal.  He is still on Metformin and magnesium, which we had discussed at his last visit.  He says his Metformin was changed to extended release and has magnesium was cut in half.  Currently he is only on magnesium 200 mg twice daily.   Past Medical History:  Diagnosis Date  . Achilles tendinitis   . Allergy   . Cataract   . Chronic kidney disease   . Complication of anesthesia    urinary retention  . 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 Diabetes mellitus, type 2 ([place])   . Dyslipidemia   . GERD (gastroesophageal reflux disease)   . Heart murmur    child  . Hyperlipidemia   . Hypertension   . Osteoarthritis    right knee  . Personal history of colonic adenomas 01/16/2013  . Plantar fasciitis    Past Surgical History:  Procedure Laterality Date  . CATARACT EXTRACTION  08/2012 L, 09/2012 R  . COLONOSCOPY    . TONSILLECTOMY   1945  . TONSILLECTOMY    . TOTAL HIP ARTHROPLASTY  2011   Left  . TOTAL HIP ARTHROPLASTY Right 07/09/2015   Procedure: TOTAL HIP ARTHROPLASTY ANTERIOR APPROACH;  Surgeon: Melrose Nakayama, MD;  Location: [facility];  Service: Orthopedics;  Laterality: Right;  . TOTAL KNEE ARTHROPLASTY Right 07/21/2016   Procedure: RIGHT TOTAL KNEE ARTHROPLASTY;  Surgeon: Melrose Nakayama, MD;  Location: [facility];  Service: Orthopedics;  Laterality: Right;    reports that he quit smoking about 33 years ago. He has a 20.00 pack-year smoking history. He has never used smokeless tobacco. He reports current alcohol use of about 1.0 - 2.0 standard drink of alcohol per week. He reports that he does not use drugs. family history includes Diabetes in his maternal grandfather; Hypertension in his father; Ovarian cancer in his mother; Pneumonia in his paternal grandfather. Allergies  Allergen Reactions  . Nsaids     Kidney Disease  . Sulfa Antibiotics Other (See Comments)    As child    Not sure rxn  . Thiazide-Type Diuretics Other (See Comments)    Unknown      Outpatient Encounter Medications as of 09/04/2020  Medication Sig  . amLODipine (NORVASC) 10 MG tablet Take 1 tablet (10 mg total) by mouth daily.  Marland Kitchen atorvastatin (LIPITOR)  40 MG tablet Take 1 tablet (40 mg total) by mouth daily.  . blood glucose meter kit and supplies KIT Use to test blood sugar up to twice a day. DX E11.09  . Cholecalciferol (VITAMIN D) 2000 units CAPS Take 1 capsule (2,000 Units total) by mouth every morning.  . Continuous Blood Gluc Receiver (FREESTYLE LIBRE 14 DAY READER) DEVI 1 Device by Does not apply route as directed.  . Continuous Blood Gluc Sensor (FREESTYLE LIBRE 14 DAY SENSOR) MISC 1 Device by Does not apply route every 14 (fourteen) days.  . Cyanocobalamin (VITAMIN B12 PO) Take 1 tablet by mouth daily.  . dapagliflozin propanediol (FARXIGA) 5 MG TABS tablet Take 1 tablet (5 mg total) by mouth daily.  Marland Kitchen glucose blood test strip Use as  instructed to test blood sugar up to twice a day. DX:  E11.09  . Krill Oil 300 MG CAPS Take 500 mg by mouth daily.   . Lancets 16X MISC Delica Fine Lancets. Use to test blood sugar up to twice a day. DX E11.09  . lisinopril (ZESTRIL) 20 MG tablet Take 1 tablet (20 mg total) by mouth daily.  . magnesium oxide (MAG-OX) 400 MG tablet Take 1 tablet (400 mg total) by mouth 3 (three) times daily.  . metFORMIN (GLUCOPHAGE-XR) 500 MG 24 hr tablet Take 2 tablets twice daily with meals.  . Multiple Vitamins-Minerals (SENIOR MULTIVITAMIN PLUS) TABS Take 1 tablet by mouth daily.    Marland Kitchen omeprazole (PRILOSEC) 20 MG capsule Take 1 capsule (20 mg total) by mouth at bedtime. (Patient taking differently: Take 20 mg by mouth every other day. )  . Probiotic Product (PROBIOTIC DAILY PO) Take by mouth.  . tamsulosin (FLOMAX) 0.4 MG CAPS capsule Take 1 capsule (0.4 mg total) by mouth at bedtime. (Patient taking differently: Take 0.4-0.8 mg by mouth See admin instructions. Take 0.4 mg by mouth daily. Starting 7 days before procedure take 0.2m by mouth daily)  . Triamcinolone Acetonide (NASACORT AQ NA) Place 1 spray into the nose daily as needed (allergies).   Facility-Administered Encounter Medications as of 09/04/2020  Medication  . 0.9 %  sodium chloride infusion    REVIEW OF SYSTEMS  : All other systems reviewed and negative except where noted in the History of Present Illness.  PHYSICAL EXAM: BP 130/80   Pulse 84   Ht 5' 11"  (1.803 m)   Wt 193 lb (87.5 kg)   SpO2 95%   BMI 26.92 kg/m  General: Well developed white male in no acute distress Head: Normocephalic and atraumatic Eyes:  Sclerae anicteric, conjunctiva pink. Ears: Normal auditory acuity Lungs: Clear throughout to auscultation; no W/R/R. Heart: Regular rate and rhythm; no M/R/G. Abdomen: Soft, non-distended.  BS present and somewhat hyperactive.  Non-tender. Musculoskeletal: Symmetrical with no gross deformities  Skin: No lesions on visible  extremities Extremities: No edema  Neurological: Alert oriented x 4, grossly non-focal Psychological:  Alert and cooperative. Normal mood and affect  ASSESSMENT AND PLAN: *79year old male with complaints of diarrhea/loose stools.  He was seen about 10 months ago for the same issue, but had only had 3 episodes at that point.  Now is having episodes about every month.  The biggest complaint for him is the unpredictability and urgency that comes with this.  Has normal bowel movements between episodes.  Once again I do not think that this is infectious source.  We again discussed medications.  His Metformin had been changed to extended release and his magnesium has been reduced,  but it is still possible that these are causing an issue.  Question if this is dietary but it would be unusual for him to pop up with new food intolerances so late in life.  Nonetheless, we will check celiac labs.  Will check TSH.  Question SIBO/IBS-D.  We will send Xifaxan 550 mg 3 times daily for 14 days and try to treat him empirically for this to help reset his gut flora.  We will continue probiotic.  He will be back in touch with our office in about 3 to 4 weeks with an update on his symptoms.  If he fails to have improvement and his lab studies are normal may need to consider repeating colonoscopy to rule out microscopic colitis, etc.  He is also going to discuss with his PCP about possibly discontinuing his metformin and magnesium for short while to see if this helps.   CC:  Libby Maw,*

## 2020-09-05 LAB — TISSUE TRANSGLUTAMINASE ABS,IGG,IGA
(tTG) Ab, IgA: <1 U/mL
(tTG) Ab, IgG: <1 U/mL

## 2020-09-05 LAB — IGA: Immunoglobulin A: 162 mg/dL (ref 70–320)

## 2020-09-06 MED ORDER — RIFAXIMIN 550 MG PO TABS: 550.0000 mg | ORAL_TABLET | Freq: Three times a day (TID) | ORAL | 0 refills | Status: AC

## 2020-09-06 NOTE — Telephone Encounter (Signed)
Sent Xifaxan to [facility] pharmacy. Left message for patient to call office.

## 2020-09-06 NOTE — Telephone Encounter (Signed)
PA submitted via Covermymeds for Xifaxan.

## 2020-09-11 ENCOUNTER — Telehealth: Payer: Self-pay | Admitting: Gastroenterology

## 2020-09-12 NOTE — Telephone Encounter (Signed)
Sent patient a mychart message.

## 2020-09-23 DIAGNOSIS — M1712 Unilateral primary osteoarthritis, left knee: Secondary | ICD-10-CM | POA: Diagnosis not present

## 2020-09-24 ENCOUNTER — Encounter: Payer: Self-pay | Admitting: Family Medicine

## 2020-09-28 ENCOUNTER — Encounter: Payer: Self-pay | Admitting: Dermatology

## 2020-09-28 NOTE — Progress Notes (Signed)
   Follow-Up Visit   Subjective  Nathan Gomez is a 79 y.o. male who presents for the following: Annual Exam (skin check (no new concerns)).  General skin check Location:  Duration:  Quality:  Associated Signs/Symptoms: Modifying Factors:  Severity:  Timing: Context:   Objective  Well appearing patient in no apparent distress; mood and affect are within normal limits.  All skin waist up examined.   Assessment & Plan    AK (actinic keratosis) Head - Anterior (Face)  May spot treat with fluorouracil or watch the spots if they are stable.  The fluorouracil would be applied every 1-2 nights for a total of at least 15 applications unless the irritation is too severe.  Cutaneous neurofibroma Neck - Posterior  Leave if stable  Encounter for screening for malignant neoplasm of skin Mid Back  Annual skin examination.  Routine follow-up for Nathan Gomez date of birth 13-Jun-1941.  Waist up skin examination showed no atypical moles, melanoma, or nonmole skin cancer.  He had an excellent response to using topical fluorouracil on his face 1 to 2 years ago but now has some recurrent actinic keratoses on the left forehead.  He may try just spot treating this every other night for 4weeks.  The 3 cm brown rough spot on the left wrist as well as 2 smaller spots on the left collarbone represent seborrheic keratoses and are safe to leave if clinically stable.  Two stuck out moles on the back of the left neck may actually be small neurofibromas and do not currently require intervention.  Routine follow-up 1 year.    I, Lavonna Monarch, MD, have reviewed all documentation for this visit.  The documentation on 09/28/20 for the exam, diagnosis, procedures, and orders are all accurate and complete.

## 2020-09-30 DIAGNOSIS — M1712 Unilateral primary osteoarthritis, left knee: Secondary | ICD-10-CM | POA: Diagnosis not present

## 2020-10-07 DIAGNOSIS — M1712 Unilateral primary osteoarthritis, left knee: Secondary | ICD-10-CM | POA: Diagnosis not present

## 2020-10-07 DIAGNOSIS — N401 Enlarged prostate with lower urinary tract symptoms: Secondary | ICD-10-CM | POA: Diagnosis not present

## 2020-10-07 DIAGNOSIS — R972 Elevated prostate specific antigen [PSA]: Secondary | ICD-10-CM | POA: Diagnosis not present

## 2020-10-07 DIAGNOSIS — R351 Nocturia: Secondary | ICD-10-CM | POA: Diagnosis not present

## 2020-10-30 MED ORDER — METRONIDAZOLE 250 MG PO TABS
250.0000 mg | ORAL_TABLET | Freq: Three times a day (TID) | ORAL | 0 refills | Status: AC
Start: 2020-10-30 — End: 2020-11-06

## 2020-10-30 NOTE — Telephone Encounter (Signed)
Per [facility] Rifaxine needs a prior authorization but the patient has asked for a cheaper medication like Flagyl. Can we switch to Flagyl? Please advise.

## 2020-11-06 ENCOUNTER — Other Ambulatory Visit: Payer: Self-pay | Admitting: Urology

## 2020-11-06 DIAGNOSIS — R972 Elevated prostate specific antigen [PSA]: Secondary | ICD-10-CM

## 2020-11-07 ENCOUNTER — Other Ambulatory Visit (HOSPITAL_COMMUNITY): Payer: Self-pay | Admitting: Urology

## 2020-11-07 DIAGNOSIS — R972 Elevated prostate specific antigen [PSA]: Secondary | ICD-10-CM

## 2020-11-22 ENCOUNTER — Ambulatory Visit (HOSPITAL_COMMUNITY): Payer: Medicare Other

## 2020-11-25 ENCOUNTER — Ambulatory Visit (HOSPITAL_COMMUNITY)
Admission: RE | Admit: 2020-11-25 | Discharge: 2020-11-25 | Disposition: A | Discharge status: Home / Self Care | Payer: Medicare Other | Source: Ambulatory Visit | Attending: Urology | Admitting: Urology

## 2020-11-25 ENCOUNTER — Other Ambulatory Visit: Payer: Self-pay

## 2020-11-25 DIAGNOSIS — R590 Localized enlarged lymph nodes: Secondary | ICD-10-CM | POA: Diagnosis not present

## 2020-11-25 DIAGNOSIS — R972 Elevated prostate specific antigen [PSA]: Secondary | ICD-10-CM | POA: Diagnosis not present

## 2020-11-25 MED ORDER — GADOBUTROL 1 MMOL/ML IV SOLN
9.0000 mL | Freq: Once | INTRAVENOUS | Status: AC | PRN
  Administered 2020-11-25: 9 mL via INTRAVENOUS

## 2020-12-05 ENCOUNTER — Other Ambulatory Visit: Payer: Medicare Other

## 2020-12-20 DIAGNOSIS — R197 Diarrhea, unspecified: Secondary | ICD-10-CM | POA: Diagnosis not present

## 2020-12-24 DIAGNOSIS — R197 Diarrhea, unspecified: Secondary | ICD-10-CM | POA: Diagnosis not present

## 2020-12-25 DIAGNOSIS — R197 Diarrhea, unspecified: Secondary | ICD-10-CM | POA: Diagnosis not present

## 2020-12-30 ENCOUNTER — Ambulatory Visit (INDEPENDENT_AMBULATORY_CARE_PROVIDER_SITE_OTHER): Payer: Medicare Other | Admitting: Family Medicine

## 2020-12-30 ENCOUNTER — Encounter: Payer: Self-pay | Admitting: Family Medicine

## 2020-12-30 ENCOUNTER — Other Ambulatory Visit: Payer: Self-pay

## 2020-12-30 VITALS — BP 122/68 | HR 70 | Temp 97.6°F | Ht 71.0 in | Wt 184.8 lb

## 2020-12-30 DIAGNOSIS — E1129 Type 2 diabetes mellitus with other diabetic kidney complication: Secondary | ICD-10-CM | POA: Diagnosis not present

## 2020-12-30 DIAGNOSIS — E538 Deficiency of other specified B group vitamins: Secondary | ICD-10-CM | POA: Diagnosis not present

## 2020-12-30 DIAGNOSIS — C61 Malignant neoplasm of prostate: Secondary | ICD-10-CM | POA: Insufficient documentation

## 2020-12-30 DIAGNOSIS — E1169 Type 2 diabetes mellitus with other specified complication: Secondary | ICD-10-CM | POA: Diagnosis not present

## 2020-12-30 DIAGNOSIS — E1159 Type 2 diabetes mellitus with other circulatory complications: Secondary | ICD-10-CM

## 2020-12-30 DIAGNOSIS — I152 Hypertension secondary to endocrine disorders: Secondary | ICD-10-CM

## 2020-12-30 DIAGNOSIS — E785 Hyperlipidemia, unspecified: Secondary | ICD-10-CM | POA: Diagnosis not present

## 2020-12-30 DIAGNOSIS — E559 Vitamin D deficiency, unspecified: Secondary | ICD-10-CM | POA: Diagnosis not present

## 2020-12-30 DIAGNOSIS — D4959 Neoplasm of unspecified behavior of other genitourinary organ: Secondary | ICD-10-CM | POA: Diagnosis not present

## 2020-12-30 DIAGNOSIS — K580 Irritable bowel syndrome with diarrhea: Secondary | ICD-10-CM

## 2020-12-30 LAB — URINALYSIS, ROUTINE W REFLEX MICROSCOPIC
Bilirubin Urine: NEGATIVE
Hgb urine dipstick: NEGATIVE
Ketones, ur: NEGATIVE
Leukocytes,Ua: NEGATIVE
Nitrite: NEGATIVE
RBC / HPF: NONE SEEN (ref 0–?)
Specific Gravity, Urine: 1.025 (ref 1.000–1.030)
Total Protein, Urine: NEGATIVE
Urine Glucose: NEGATIVE
Urobilinogen, UA: 0.2 (ref 0.0–1.0)
pH: 5.5 (ref 5.0–8.0)

## 2020-12-30 LAB — LIPID PANEL
Cholesterol: 119 mg/dL (ref 0–200)
HDL: 36.6 mg/dL — ABNORMAL LOW (ref >39.00)
LDL Cholesterol: 53 mg/dL (ref 0–99)
NonHDL: 82.43
Total CHOL/HDL Ratio: 3
Triglycerides: 147 mg/dL (ref 0.0–149.0)
VLDL: 29.4 mg/dL (ref 0.0–40.0)

## 2020-12-30 LAB — VITAMIN D 25 HYDROXY (VIT D DEFICIENCY, FRACTURES): VITD: 45.93 ng/mL (ref 30.00–100.00)

## 2020-12-30 LAB — VITAMIN B12: Vitamin B-12: 483 pg/mL (ref 211–911)

## 2020-12-30 LAB — MAGNESIUM: Magnesium: 1.1 mg/dL — ABNORMAL LOW (ref 1.5–2.5)

## 2020-12-30 LAB — BASIC METABOLIC PANEL
BUN: 22 mg/dL (ref 6–23)
CO2: 23 mEq/L (ref 19–32)
Calcium: 9.6 mg/dL (ref 8.4–10.5)
Chloride: 106 mEq/L (ref 96–112)
Creatinine, Ser: 1.46 mg/dL (ref 0.40–1.50)
GFR: 44.77 mL/min — ABNORMAL LOW (ref >60.00)
Glucose, Bld: 142 mg/dL — ABNORMAL HIGH (ref 70–99)
Potassium: 4.8 mEq/L (ref 3.5–5.1)
Sodium: 140 mEq/L (ref 135–145)

## 2020-12-30 LAB — HEMOGLOBIN A1C: Hgb A1c MFr Bld: 6.5 % (ref 4.6–6.5)

## 2020-12-30 LAB — MICROALBUMIN / CREATININE URINE RATIO
Creatinine,U: 106.7 mg/dL
Microalb Creat Ratio: 8.7 mg/g (ref 0.0–30.0)
Microalb, Ur: 9.3 mg/dL — ABNORMAL HIGH (ref 0.0–1.9)

## 2020-12-30 NOTE — Progress Notes (Signed)
[facility] [address] [place] [address] Dept: [phone] Dept Fax: [phone]  Chronic Disease Office Visit  Subjective:    Patient ID: Nathan Gomez, male    DOB: 04/03/1941, 80 y.o..   MRN: [id number]  Chief Complaint  Patient presents with  . Follow-up    Follow up on diabetes and cholesterol. Patient has concerns about IBS has an upcoming appointment for colonoscopy.     History of Present Illness:  Patient is in today for reassessment of his chronic medical conditions. Nathan Gomez notes that he is currently undergoing evaluation for irritable bowel syndrome. (IBS). He notes his condition is predominantly associated with diarrhea. This has been evolving over the past 18 months. He was on a trial of Flagyl, which did not resolve his symptoms. He is scheduled for a colonoscopy on 01/16/2021.  Nathan Gomez also notes he had an MRI on 12/07/2020 through Dr. Claudia Desanctis with [facility]. This was apparently conducted as part of an evaluation of an elevated PSA. It showed multiple bilateral lesions suspicious for high-grade macroscopic prostate cancer. He will be having biopsies performed after her completes his upcoming colonoscopy.  Nathan Gomez has a history of Type 2 DM with hypertension and hyperlipidemia. As well, he has had a number of vitamin/mineral deficiencies. He was started on [place] last year, but developed orthostasis, palpitations, and dizziness within a few days. The medication was stopped. He has not had a follow-up since then on his DM labs. He notes his HbA1c was in good control for 25 years. He was surprised that his HbA1c had gone up at his last visit.  Past Medical History: Patient Active Problem List   Diagnosis Date Noted  . Irritable bowel syndrome with diarrhea 12/30/2020  . Prostate tumor 12/30/2020  . Intermittent diarrhea 09/04/2020  . Flatulence 09/04/2020  . Magnesium deficiency 05/01/2020  .  Stage 3b chronic kidney disease ([place]) 05/01/2020  . Loose stools 10/25/2019  . Caregiver stress 08/15/2019  . GERD (gastroesophageal reflux disease) 09/12/2017  . Vitamin D deficiency 09/12/2017  . Ruptured, tendon, Achilles, right, sequela 04/08/2017  . stage 3 chronic kidney disease due to type 2 diabetes mellitus ([place]) 02/22/2017  . Elevated PSA 08/25/2016  . Hypomagnesemia 04/08/2016  . B12 deficiency 01/30/2016  . Urinary retention 07/30/2015  . Primary osteoarthritis of right knee 07/09/2015  . Personal history of colonic adenomas 01/16/2013  . Pernicious anemia 02/22/2012  . Anemia   . Diabetes mellitus type II, controlled ([place]) 01/05/2011  . Hyperlipidemia associated with type 2 diabetes mellitus ([place]) 01/05/2011  . Hypertension associated with diabetes ([place]) 01/05/2011  . Osteoarthritis 01/05/2011   Past Surgical History:  Procedure Laterality Date  . CATARACT EXTRACTION  08/2012 L, 09/2012 R  . COLONOSCOPY    . TONSILLECTOMY  1945  . TONSILLECTOMY    . TOTAL HIP ARTHROPLASTY  2011   Left  . TOTAL HIP ARTHROPLASTY Right 07/09/2015   Procedure: TOTAL HIP ARTHROPLASTY ANTERIOR APPROACH;  Surgeon: Melrose Nakayama, MD;  Location: [facility];  Service: Orthopedics;  Laterality: Right;  . TOTAL KNEE ARTHROPLASTY Right 07/21/2016   Procedure: RIGHT TOTAL KNEE ARTHROPLASTY;  Surgeon: Melrose Nakayama, MD;  Location: [facility];  Service: Orthopedics;  Laterality: Right;   Family History  Problem Relation Age of Onset  . Ovarian cancer Mother   . Hypertension Father   . Diabetes Maternal Grandfather   . Pneumonia Paternal Grandfather   . Colon cancer Neg Hx   . Esophageal  cancer Neg Hx   . Liver cancer Neg Hx   . Pancreatic cancer Neg Hx   . Rectal cancer Neg Hx   . Stomach cancer Neg Hx    Outpatient Medications Prior to Visit  Medication Sig Dispense Refill  . amLODipine (NORVASC) 10 MG tablet Take 1 tablet (10 mg total) by mouth daily. 90 tablet 3  . atorvastatin (LIPITOR) 40 MG  tablet Take 1 tablet (40 mg total) by mouth daily. 90 tablet 3  . blood glucose meter kit and supplies KIT Use to test blood sugar up to twice a day. DX E11.09 1 each 0  . Cholecalciferol (VITAMIN D) 2000 units CAPS Take 1 capsule (2,000 Units total) by mouth every morning. 90 capsule 3  . Continuous Blood Gluc Receiver (FREESTYLE LIBRE 14 DAY READER) DEVI 1 Device by Does not apply route as directed. 1 each 2  . Continuous Blood Gluc Sensor (FREESTYLE LIBRE 14 DAY SENSOR) MISC 1 Device by Does not apply route every 14 (fourteen) days. 5 each 5  . Cyanocobalamin (VITAMIN B12 PO) Take 1 tablet by mouth daily.    Marland Kitchen glucose blood test strip Use as instructed to test blood sugar up to twice a day. DX:  E11.09 200 each 3  . Krill Oil 300 MG CAPS Take 500 mg by mouth daily.     . Lancets 76H MISC Delica Fine Lancets. Use to test blood sugar up to twice a day. DX E11.09 200 each 3  . lisinopril (ZESTRIL) 20 MG tablet Take 1 tablet (20 mg total) by mouth daily. 90 tablet 3  . metFORMIN (GLUCOPHAGE-XR) 500 MG 24 hr tablet Take 2 tablets twice daily with meals. 360 tablet 1  . Multiple Vitamins-Minerals (SENIOR MULTIVITAMIN PLUS) TABS Take 1 tablet by mouth daily.    . Probiotic Product (PROBIOTIC DAILY PO) Take by mouth.    . tamsulosin (FLOMAX) 0.4 MG CAPS capsule Take 1 capsule (0.4 mg total) by mouth at bedtime. (Patient taking differently: Take 0.4-0.8 mg by mouth See admin instructions. Take 0.4 mg by mouth daily. Starting 7 days before procedure take 0.52m by mouth daily) 14 capsule 0  . Triamcinolone Acetonide (NASACORT AQ NA) Place 1 spray into the nose daily as needed (allergies).    . dapagliflozin propanediol (FARXIGA) 5 MG TABS tablet Take 1 tablet (5 mg total) by mouth daily. (Patient not taking: Reported on 12/30/2020) 30 tablet 2  . magnesium oxide (MAG-OX) 400 MG tablet Take 1 tablet (400 mg total) by mouth 3 (three) times daily. (Patient not taking: Reported on 12/30/2020) 360 tablet 3  .  omeprazole (PRILOSEC) 20 MG capsule Take 1 capsule (20 mg total) by mouth at bedtime. (Patient not taking: Reported on 12/30/2020) 90 capsule 3   Facility-Administered Medications Prior to Visit  Medication Dose Route Frequency Provider Last Rate Last Admin  . 0.9 %  sodium chloride infusion  500 mL Intravenous Once GGatha Mayer MD       Allergies  Allergen Reactions  . Nsaids     Kidney Disease  . Sulfa Antibiotics Other (See Comments)    As child    Not sure rxn  . Thiazide-Type Diuretics Other (See Comments)    Unknown   Objective:   Today's Vitals   12/30/20 1014  BP: 122/68  Pulse: 70  Temp: 97.6 F (36.4 C)  TempSrc: Temporal  SpO2: 95%  Weight: 184 lb 12.8 oz (83.8 kg)  Height: 5' 11"  (1.803 m)   Body mass  index is 25.77 kg/m.   General: Well developed, well nourished. No acute distress. Abdomen: Soft, non-tender. No hepatosplenomegaly. No rebound or guarding. Psych: Alert and oriented. Normal mood and affect.  Health Maintenance Due  Topic Date Due  . FOOT EXAM  09/27/2019  . HEMOGLOBIN A1C  10/31/2020     Assessment & Plan:   1. Controlled type 2 diabetes mellitus with other diabetic kidney complication, without long-term current use of insulin (HCC) We will check annual DM labs to assess current level of control. We did discuss the natural history fo Type 2 DM and how pancreatic insufficiency can develop necessitating insulin at some point.  - Microalbumin / creatinine urine ratio - Basic metabolic panel - Hemoglobin A1c - Urinalysis, Routine w reflex microscopic  2. Hyperlipidemia associated with type 2 diabetes mellitus (HCC) On Lipitor. We will check his lipids today.  - Lipid panel  3. Hypertension associated with diabetes ([place]) At goal on amlodipine and lisinopril.  4. B12 deficiency His dose of B12 was increased last spring. We will assess his current level.  - Vitamin B12  5. Hypomagnesemia Nathan Gomez was on a magnesium supplement.  This was stopped by a GI doctor out of concerns for this possibly contributing to his diarrhea.  - Magnesium  6. Irritable bowel syndrome with diarrhea Currently being evaluated by GI. Colonoscopy pending.  7. Prostate tumor Possible prostate cancer present. Biopsy procedure pending.  8. Vitamin D deficiency On replacement. We will check his current level.  - VITAMIN D 25 Hydroxy (Vit-D Deficiency, Fractures)  Haydee Salter, MD

## 2021-01-03 DIAGNOSIS — R197 Diarrhea, unspecified: Secondary | ICD-10-CM | POA: Diagnosis not present

## 2021-01-06 ENCOUNTER — Ambulatory Visit: Payer: Medicare Other | Admitting: Family Medicine

## 2021-01-07 ENCOUNTER — Ambulatory Visit: Payer: Medicare Other | Admitting: Gastroenterology

## 2021-01-09 ENCOUNTER — Other Ambulatory Visit: Payer: Self-pay | Admitting: Family Medicine

## 2021-01-09 ENCOUNTER — Encounter: Payer: Self-pay | Admitting: Family Medicine

## 2021-01-09 DIAGNOSIS — E1129 Type 2 diabetes mellitus with other diabetic kidney complication: Secondary | ICD-10-CM

## 2021-01-13 DIAGNOSIS — Z01812 Encounter for preprocedural laboratory examination: Secondary | ICD-10-CM | POA: Diagnosis not present

## 2021-01-15 ENCOUNTER — Other Ambulatory Visit: Payer: Self-pay | Admitting: Physician Assistant

## 2021-01-15 DIAGNOSIS — K8689 Other specified diseases of pancreas: Secondary | ICD-10-CM

## 2021-01-16 DIAGNOSIS — R197 Diarrhea, unspecified: Secondary | ICD-10-CM | POA: Diagnosis not present

## 2021-01-21 DIAGNOSIS — R197 Diarrhea, unspecified: Secondary | ICD-10-CM | POA: Diagnosis not present

## 2021-01-24 DIAGNOSIS — R972 Elevated prostate specific antigen [PSA]: Secondary | ICD-10-CM | POA: Diagnosis not present

## 2021-01-24 DIAGNOSIS — D075 Carcinoma in situ of prostate: Secondary | ICD-10-CM | POA: Diagnosis not present

## 2021-01-24 DIAGNOSIS — C61 Malignant neoplasm of prostate: Secondary | ICD-10-CM | POA: Diagnosis not present

## 2021-01-29 ENCOUNTER — Ambulatory Visit
Admission: RE | Admit: 2021-01-29 | Discharge: 2021-01-29 | Disposition: A | Discharge status: Home / Self Care | Payer: Medicare Other | Source: Ambulatory Visit | Attending: Physician Assistant | Admitting: Physician Assistant

## 2021-01-29 ENCOUNTER — Ambulatory Visit: Payer: Medicare Other | Admitting: Internal Medicine

## 2021-01-29 DIAGNOSIS — M47816 Spondylosis without myelopathy or radiculopathy, lumbar region: Secondary | ICD-10-CM | POA: Diagnosis not present

## 2021-01-29 DIAGNOSIS — K8689 Other specified diseases of pancreas: Secondary | ICD-10-CM | POA: Diagnosis not present

## 2021-01-29 DIAGNOSIS — N281 Cyst of kidney, acquired: Secondary | ICD-10-CM | POA: Diagnosis not present

## 2021-01-29 DIAGNOSIS — M4186 Other forms of scoliosis, lumbar region: Secondary | ICD-10-CM | POA: Diagnosis not present

## 2021-01-29 MED ORDER — IOPAMIDOL (ISOVUE-300) INJECTION 61%
80.0000 mL | Freq: Once | INTRAVENOUS | Status: AC | PRN
  Administered 2021-01-29: 80 mL via INTRAVENOUS

## 2021-02-03 DIAGNOSIS — R3914 Feeling of incomplete bladder emptying: Secondary | ICD-10-CM | POA: Diagnosis not present

## 2021-02-03 DIAGNOSIS — N401 Enlarged prostate with lower urinary tract symptoms: Secondary | ICD-10-CM | POA: Diagnosis not present

## 2021-02-03 DIAGNOSIS — C61 Malignant neoplasm of prostate: Secondary | ICD-10-CM | POA: Diagnosis not present

## 2021-02-04 DIAGNOSIS — K8681 Exocrine pancreatic insufficiency: Secondary | ICD-10-CM | POA: Diagnosis not present

## 2021-03-10 DIAGNOSIS — Z23 Encounter for immunization: Secondary | ICD-10-CM | POA: Diagnosis not present

## 2021-03-18 ENCOUNTER — Other Ambulatory Visit: Payer: Self-pay

## 2021-03-18 ENCOUNTER — Ambulatory Visit
Admission: RE | Admit: 2021-03-18 | Discharge: 2021-03-18 | Disposition: A | Discharge status: Home / Self Care | Payer: Medicare Other | Source: Ambulatory Visit | Attending: Radiation Oncology | Admitting: Radiation Oncology

## 2021-03-18 ENCOUNTER — Encounter: Payer: Self-pay | Admitting: Radiation Oncology

## 2021-03-18 VITALS — BP 145/64 | HR 86 | Temp 96.8°F | Resp 18 | Ht 71.0 in | Wt 182.5 lb

## 2021-03-18 DIAGNOSIS — Z7984 Long term (current) use of oral hypoglycemic drugs: Secondary | ICD-10-CM | POA: Insufficient documentation

## 2021-03-18 DIAGNOSIS — Z79899 Other long term (current) drug therapy: Secondary | ICD-10-CM | POA: Diagnosis not present

## 2021-03-18 DIAGNOSIS — N189 Chronic kidney disease, unspecified: Secondary | ICD-10-CM | POA: Insufficient documentation

## 2021-03-18 DIAGNOSIS — R011 Cardiac murmur, unspecified: Secondary | ICD-10-CM | POA: Diagnosis not present

## 2021-03-18 DIAGNOSIS — E119 Type 2 diabetes mellitus without complications: Secondary | ICD-10-CM | POA: Diagnosis not present

## 2021-03-18 DIAGNOSIS — Z8041 Family history of malignant neoplasm of ovary: Secondary | ICD-10-CM | POA: Insufficient documentation

## 2021-03-18 DIAGNOSIS — K219 Gastro-esophageal reflux disease without esophagitis: Secondary | ICD-10-CM | POA: Insufficient documentation

## 2021-03-18 DIAGNOSIS — E785 Hyperlipidemia, unspecified: Secondary | ICD-10-CM | POA: Diagnosis not present

## 2021-03-18 DIAGNOSIS — C61 Malignant neoplasm of prostate: Secondary | ICD-10-CM | POA: Diagnosis not present

## 2021-03-18 DIAGNOSIS — I129 Hypertensive chronic kidney disease with stage 1 through stage 4 chronic kidney disease, or unspecified chronic kidney disease: Secondary | ICD-10-CM | POA: Diagnosis not present

## 2021-03-18 DIAGNOSIS — Z87891 Personal history of nicotine dependence: Secondary | ICD-10-CM | POA: Insufficient documentation

## 2021-03-18 DIAGNOSIS — M1990 Unspecified osteoarthritis, unspecified site: Secondary | ICD-10-CM | POA: Insufficient documentation

## 2021-03-18 HISTORY — DX: Malignant neoplasm of prostate: C61

## 2021-03-18 NOTE — Progress Notes (Signed)
GU Location of Tumor / Histology: prostatic adenocarcinoma  If Prostate Cancer, Gleason Score is (3 + 4) and PSA is (6.36) on 07/10/2020. Prostate volume: 88 g.  Nathan Gomez is a former patient of Dr. Karsten Ro. He returned November 2021 to follow up on elevated PSA. Patient explains that Dr. Claudia Desanctis did a DRE which revealed a left mid gland nodule prompting biopsy.  Biopsies of prostate (if applicable) revealed:   Past/Anticipated interventions by urology, if any: prescribed tamsulosin for BPH, active surveillance of rising psa, prostate biopsy, referral to Dr. Tammi Klippel to discuss radiation therapy options.  Past/Anticipated interventions by medical oncology, if any: no  Weight changes, if any: no  Bowel/Bladder complaints, if any: IPSS 15. SHIM 10. Denies dysuria, hematuria, urinary leakage or incontinence. Reports diarrhea. Reports he began Creon approximately six weeks ago thus diarrhea is better.   Nausea/Vomiting, if any: no  Pain issues, if any: Denies new pain.  SAFETY ISSUES:  Prior radiation? no  Pacemaker/ICD? no  Possible current pregnancy? no  Is the patient on methotrexate? no  Current Complaints / other details:  80 year old male. Married. Resides in [place].

## 2021-03-18 NOTE — Progress Notes (Signed)
Radiation Oncology         ([phone]) [phone] ________________________________  Initial outpatient Consultation  Name: Nathan Gomez MRN: [id number]  Date: 03/18/2021  DOB: [birth year]  SN:[id number], Nathan Fries, MD  Nathan Fries, MD   REFERRING PHYSICIAN: Robley Fries, MD  DIAGNOSIS: 80 y.o. gentleman with Stage T2a adenocarcinoma of the prostate with Gleason score of 3+4, and PSA of 6.36.    ICD-10-CM   1. Malignant neoplasm of prostate (Nathan Gomez)  C61     HISTORY OF PRESENT ILLNESS: Nathan Gomez is a 80 y.o. male with a diagnosis of prostate cancer. He has a history of elevated but stable PSA between 3.5 - 4.9, previously followed by Dr. Karsten Gomez. His PSA increased to 6.35 in 11/2019 and remained elevated at 6.58 on 04/04/20 and 6.36 on 07/10/20. His care was transferred to Dr. Claudia Gomez upon Dr. Simone Gomez retirement and a digital rectal examination performed on 10/07/20 revealed a left mid gland nodule. He underwent prostate MRI on 11/25/20 showing multiple bilateral lesions, including a dominant 15 mm PI-RADS 4 lesion in the right medial apex and a dominant 14 mm PI-RADS 4 lesion in the medial left mid peripheral zone as well as PI-RADS 3 lesions in the right and left peripheral zones without evidence of extracapsular extension, seminal vesicle invasion, lymphadenopathy, or metastatic disease. The prostate volume was estimated at 88.6 mL.  The patient proceeded to MRI fusion transrectal ultrasound with 23 biopsies of the prostate on 01/24/21.  The prostate volume measured 74 cc by ultrasound.  Out of 23 core biopsies, 9 were positive, all on the left. The maximum Gleason score was 3+4, and this was seen in 2 of 3 samples from the ROI MRI lesion #3 and all 6 left-sided systematic cores. Additionally, Gleason 3+3 was seen in one sample from the ROI MRI lesion #4.  The patient reviewed the biopsy results with his urologist and he has kindly been referred today for discussion of potential radiation  treatment options.   PREVIOUS RADIATION THERAPY: No  PAST MEDICAL HISTORY:  Past Medical History:  Diagnosis Date  . Achilles tendinitis   . Allergy   . Cataract   . Chronic kidney disease   . Complication of anesthesia    urinary retention  . 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 Diabetes mellitus, type 2 ([place])   . Dyslipidemia   . GERD (gastroesophageal reflux disease)   . Heart murmur    child  . Hyperlipidemia   . Hypertension   . Osteoarthritis    right knee  . Personal history of colonic adenomas 01/16/2013  . Plantar fasciitis       PAST SURGICAL HISTORY: Past Surgical History:  Procedure Laterality Date  . CATARACT EXTRACTION  08/2012 L, 09/2012 R  . COLONOSCOPY    . TONSILLECTOMY  1945  . TONSILLECTOMY    . TOTAL HIP ARTHROPLASTY  2011   Left  . TOTAL HIP ARTHROPLASTY Right 07/09/2015   Procedure: TOTAL HIP ARTHROPLASTY ANTERIOR APPROACH;  Surgeon: Melrose Nakayama, MD;  Location: [facility];  Service: Orthopedics;  Laterality: Right;  . TOTAL KNEE ARTHROPLASTY Right 07/21/2016   Procedure: RIGHT TOTAL KNEE ARTHROPLASTY;  Surgeon: Melrose Nakayama, MD;  Location: [facility];  Service: Orthopedics;  Laterality: Right;    FAMILY HISTORY:  Family History  Problem Relation Age of Onset  . Ovarian cancer Mother   . Hypertension Father   . Diabetes Maternal Grandfather   . Pneumonia Paternal Grandfather   .  Colon cancer Neg Hx   . Esophageal cancer Neg Hx   . Liver cancer Neg Hx   . Pancreatic cancer Neg Hx   . Rectal cancer Neg Hx   . Stomach cancer Neg Hx     SOCIAL HISTORY:  Social History   Socioeconomic History  . Marital status: Married    Spouse name: Not on file  . Number of children: Not on file  . Years of education: Not on file  . Highest education level: Not on file  Occupational History  . Occupation: Retired  Tobacco Use  . Smoking status: Former Smoker    Packs/day: 1.00    Years: 20.00    Pack years: 20.00    Quit date: 11/16/1986     Years since quitting: 34.3  . Smokeless tobacco: Never Used  Vaping Use  . Vaping Use: Never used  Substance and Sexual Activity  . Alcohol use: Yes    Alcohol/week: 1.0 - 2.0 standard drink    Types: 1 - 2 Glasses of wine per week    Comment: occ  . Drug use: No  . Sexual activity: Not on file  Other Topics Concern  . Not on file  Social History Narrative  . Not on file   Social Determinants of Health   Financial Resource Strain: Low Risk   . Difficulty of Paying Living Expenses: Not hard at all  Food Insecurity: No Food Insecurity  . Worried About Charity fundraiser in the Last Year: Never true  . Ran Out of Food in the Last Year: Never true  Transportation Needs: No Transportation Needs  . Lack of Transportation (Medical): No  . Lack of Transportation (Non-Medical): No  Physical Activity: Insufficiently Active  . Days of Exercise per Week: 7 days  . Minutes of Exercise per Session: 20 min  Stress: No Stress Concern Present  . Feeling of Stress : Not at all  Social Connections: Socially Integrated  . Frequency of Communication with Friends and Family: More than three times a week  . Frequency of Social Gatherings with Friends and Family: More than three times a week  . Attends Religious Services: More than 4 times per year  . Active Member of Clubs or Organizations: Yes  . Attends Archivist Meetings: More than 4 times per year  . Marital Status: Married  Human resources officer Violence: Not At Risk  . Fear of Current or Ex-Partner: No  . Emotionally Abused: No  . Physically Abused: No  . Sexually Abused: No    ALLERGIES: Nsaids, Sulfa antibiotics, and Thiazide-type diuretics  MEDICATIONS:  Current Outpatient Medications  Medication Sig Dispense Refill  . amLODipine (NORVASC) 10 MG tablet Take 1 tablet (10 mg total) by mouth daily. 90 tablet 3  . atorvastatin (LIPITOR) 40 MG tablet Take 1 tablet (40 mg total) by mouth daily. 90 tablet 3  . blood glucose  meter kit and supplies KIT Use to test blood sugar up to twice a day. DX E11.09 1 each 0  . Cholecalciferol (VITAMIN D) 2000 units CAPS Take 1 capsule (2,000 Units total) by mouth every morning. 90 capsule 3  . Continuous Blood Gluc Receiver (FREESTYLE LIBRE 14 DAY READER) DEVI 1 Device by Does not apply route as directed. 1 each 2  . Continuous Blood Gluc Sensor (FREESTYLE LIBRE 14 DAY SENSOR) MISC 1 Device by Does not apply route every 14 (fourteen) days. 5 each 5  . Cyanocobalamin (VITAMIN B12 PO) Take 1 tablet by  mouth daily.    Marland Kitchen glucose blood test strip Use as instructed to test blood sugar up to twice a day. DX:  E11.09 200 each 3  . Krill Oil 300 MG CAPS Take 500 mg by mouth daily.     . Lancets 35W MISC Delica Fine Lancets. Use to test blood sugar up to twice a day. DX E11.09 200 each 3  . lisinopril (ZESTRIL) 20 MG tablet Take 1 tablet (20 mg total) by mouth daily. 90 tablet 3  . metFORMIN (GLUCOPHAGE-XR) 500 MG 24 hr tablet TAKE 2 TABLETS 2 TIMES     DAILY WITH MEALS 360 tablet 1  . Multiple Vitamins-Minerals (SENIOR MULTIVITAMIN PLUS) TABS Take 1 tablet by mouth daily.    . Probiotic Product (PROBIOTIC DAILY PO) Take by mouth.    . tamsulosin (FLOMAX) 0.4 MG CAPS capsule Take 1 capsule (0.4 mg total) by mouth at bedtime. (Patient taking differently: Take 0.4-0.8 mg by mouth See admin instructions. Take 0.4 mg by mouth daily. Starting 7 days before procedure take 0.35m by mouth daily) 14 capsule 0  . Triamcinolone Acetonide (NASACORT AQ NA) Place 1 spray into the nose daily as needed (allergies).     Current Facility-Administered Medications  Medication Dose Route Frequency Provider Last Rate Last Admin  . 0.9 %  sodium chloride infusion  500 mL Intravenous Once GGatha Mayer MD        REVIEW OF SYSTEMS:  On review of systems, the patient reports that he is doing well overall. He denies any chest pain, shortness of breath, cough, fevers, chills, night sweats, unintended weight  changes. He denies abdominal pain, nausea or vomiting. He reports diarrhea, which has improved since starting Creon approximately 6 weeks ago. He denies any new musculoskeletal or joint aches or pains. His IPSS was 15, indicating moderate urinary symptoms with nocturia x3, weak stream, intermittency and frequency. His SHIM was 10, indicating he has moderate erectile dysfunction. A complete review of systems is obtained and is otherwise negative.    PHYSICAL EXAM:  Wt Readings from Last 3 Encounters:  03/18/21 182 lb 8 oz (82.8 kg)  12/30/20 184 lb 12.8 oz (83.8 kg)  09/04/20 193 lb (87.5 kg)   Temp Readings from Last 3 Encounters:  03/18/21 (!) 96.8 F (36 C) (Temporal)  12/30/20 97.6 F (36.4 C) (Temporal)  06/20/20 (!) 96.4 F (35.8 C) (Tympanic)   BP Readings from Last 3 Encounters:  03/18/21 (!) 145/64  12/30/20 122/68  09/04/20 130/80   Pulse Readings from Last 3 Encounters:  03/18/21 86  12/30/20 70  09/04/20 84    /10  In general this is a well appearing Caucasian male in no acute distress. He's alert and oriented x4 and appropriate throughout the examination. Cardiopulmonary assessment is negative for acute distress, and he exhibits normal effort.     KPS = 90  100 - Normal; no complaints; no evidence of disease. 90   - Able to carry on normal activity; minor signs or symptoms of disease. 80   - Normal activity with effort; some signs or symptoms of disease. 712  - Cares for self; unable to carry on normal activity or to do active work. 60   - Requires occasional assistance, but is able to care for most of his personal needs. 50   - Requires considerable assistance and frequent medical care. 41  - Disabled; requires special care and assistance. 30   - Severely disabled; hospital admission is indicated although  death not imminent. 72   - Very sick; hospital admission necessary; active supportive treatment necessary. 10   - Moribund; fatal processes progressing  rapidly. 0     - Dead  Karnofsky DA, Abelmann [place], Craver LS and Burchenal [facility] [phone]) The use of the nitrogen mustards in the palliative treatment of carcinoma: with particular reference to bronchogenic carcinoma Cancer 1 634-56  LABORATORY DATA:  Lab Results  Component Value Date   WBC 5.1 05/01/2020   HGB 12.5 (L) 05/01/2020   HCT 37.5 (L) 05/01/2020   MCV 95.7 05/01/2020   PLT 209.0 05/01/2020   Lab Results  Component Value Date   NA 140 12/30/2020   K 4.8 12/30/2020   CL 106 12/30/2020   CO2 23 12/30/2020   Lab Results  Component Value Date   ALT 13 05/01/2020   AST 15 05/01/2020   ALKPHOS 90 05/01/2020   BILITOT 0.3 05/01/2020     RADIOGRAPHY: No results found.    IMPRESSION/PLAN: 1. 80 y.o. gentleman with Stage T2a adenocarcinoma of the prostate with Gleason Score of 3+4, and PSA of 6.36. We discussed the patient's workup and outlined the nature of prostate cancer in this setting. The patient's T stage, Gleason's score, and PSA put him into the favorable intermediate risk group. Accordingly, he is eligible for a variety of potential treatment options including brachytherapy, 5.5 weeks of external radiation, or prostatectomy. We discussed the available radiation techniques, and focused on the details and logistics of delivery. The patient is not felt to be an ideal candidate for brachytherapy with a prostate volume of at least 74 cc and an IPSS score of 15.  Therefore, we discussed and outlined the risks, benefits, short and long-term effects associated with external beam radiotherapy and compared and contrasted these with prostatectomy. We discussed the role of SpaceOAR gel in reducing the rectal toxicity associated with radiotherapy.  He and his wife were encouraged to ask questions that were answered to their stated satisfaction.  At the conclusion of our conversation, the patient is interested in moving forward with 5.5 weeks of external beam therapy. We will share our  discussion with Dr. Claudia Gomez and make arrangements for fiducial marker with SpaceOAR gel placement, first available, prior to CT simulation, to reduce rectal toxicity from radiotherapy. The patient appears to have a good understanding of his disease and our treatment recommendations which are of curative intent and is in agreement with the stated plan.  Therefore, we will move forward with treatment planning accordingly, in anticipation of beginning IMRT in the near future.  We enjoyed meeting him and his wife today and look forward to continuing to participate in his care.  I spent 60 minutes in this encounter - [place], PA-C    Tyler Pita, MD  [place]: [phone]  Fax: [phone] [email or web address]  Skype  [email or web address]   This document serves as a record of services personally performed by Tyler Pita, MD and Freeman Caldron, PA-C. It was created on their behalf by Wilburn Mylar, a trained medical scribe. The creation of this record is based on the scribe's personal observations and the provider's statements to them. This document has been checked and approved by the attending provider.

## 2021-03-20 ENCOUNTER — Telehealth: Payer: Self-pay | Admitting: *Deleted

## 2021-03-20 NOTE — Telephone Encounter (Signed)
CALLED PATIENT TO INFORM OF FID. MARKERS AND SPACE OAR PLACEMENT ON 04-09-21 @ [facility] AND HIS SIM ON 04-11-21 @ [place], LVM FOR A RETURN CALL

## 2021-03-21 ENCOUNTER — Telehealth: Payer: Self-pay | Admitting: *Deleted

## 2021-03-21 NOTE — Telephone Encounter (Signed)
RETURNED PATIENT'S PHONE CALL, LVM FOR A RETURN CALL 

## 2021-03-27 ENCOUNTER — Encounter: Payer: Self-pay | Admitting: Family Medicine

## 2021-03-31 ENCOUNTER — Encounter: Payer: Self-pay | Admitting: Family Medicine

## 2021-03-31 ENCOUNTER — Ambulatory Visit (INDEPENDENT_AMBULATORY_CARE_PROVIDER_SITE_OTHER): Payer: Medicare Other | Admitting: Family Medicine

## 2021-03-31 ENCOUNTER — Other Ambulatory Visit: Payer: Self-pay

## 2021-03-31 VITALS — BP 144/70 | HR 68 | Temp 97.5°F | Ht 71.0 in | Wt 182.8 lb

## 2021-03-31 DIAGNOSIS — E785 Hyperlipidemia, unspecified: Secondary | ICD-10-CM

## 2021-03-31 DIAGNOSIS — I152 Hypertension secondary to endocrine disorders: Secondary | ICD-10-CM | POA: Diagnosis not present

## 2021-03-31 DIAGNOSIS — E1169 Type 2 diabetes mellitus with other specified complication: Secondary | ICD-10-CM

## 2021-03-31 DIAGNOSIS — E1159 Type 2 diabetes mellitus with other circulatory complications: Secondary | ICD-10-CM | POA: Diagnosis not present

## 2021-03-31 DIAGNOSIS — C61 Malignant neoplasm of prostate: Secondary | ICD-10-CM | POA: Diagnosis not present

## 2021-03-31 DIAGNOSIS — E1129 Type 2 diabetes mellitus with other diabetic kidney complication: Secondary | ICD-10-CM | POA: Diagnosis not present

## 2021-03-31 DIAGNOSIS — K8689 Other specified diseases of pancreas: Secondary | ICD-10-CM

## 2021-03-31 LAB — HEMOGLOBIN A1C: Hgb A1c MFr Bld: 6.6 % — ABNORMAL HIGH (ref 4.6–6.5)

## 2021-03-31 LAB — GLUCOSE, RANDOM: Glucose, Bld: 135 mg/dL — ABNORMAL HIGH (ref 70–99)

## 2021-03-31 NOTE — Progress Notes (Signed)
[facility] [address] [place] [address] Dept: [phone] Dept Fax: [phone]  Chronic Care Office Visit  Subjective:    Patient ID: Nathan Gomez, male    DOB: 1940/12/07, 80 y.o..   MRN: [id number]  Chief Complaint  Patient presents with  . Follow-up    3 month f/u .  Wants to discuss pancreatitis and prostrate cancer     History of Present Illness:  Patient is in today for reassessment of chronic medical issues.  Since his last vist, Mr. Osmond has been diagnosed with Stage IIB prostate cancer. He is scheduled to start radiation treatment for this in June. He continues to be on Flomax for the time being.  Mr. Bhola did follow through with GI related to his diarrhea. He underwent colonoscopy by Dr. Cristina Gong (gastreoenterology) and stool studies. A follow-up CT scan was performed, which showed some atrophy of the tail of the pancreas. Based on this and the stool studies, it was determined that he has some pancreatic insufficiency. He was started on Creon and notes he is still trying to gauge the correct number of tabs to take each day based on the size of his meals. He still has had some occasional diarrhea.  Mr. Glascoe remains off of [place]. He is unsure why he may have had a bump in his HbA1c in the past, but was glad to see this was back down at his last visit. He remains on metformin. He also has a history of hyperlipidemia (on atorvastatin and fish oil) and hypertension (on amlodipine and lisinopril).  Past Medical History: Patient Active Problem List   Diagnosis Date Noted  . Pancreatic insufficiency 03/31/2021  . Irritable bowel syndrome with diarrhea 12/30/2020  . Malignant neoplasm of prostate ([place]) 12/30/2020  . Stage 3b chronic kidney disease ([place]) 05/01/2020  . Loose stools 10/25/2019  . Caregiver stress 08/15/2019  . GERD (gastroesophageal reflux disease) 09/12/2017  . Vitamin D deficiency  09/12/2017  . Ruptured, tendon, Achilles, right, sequela 04/08/2017  . stage 3 chronic kidney disease due to type 2 diabetes mellitus ([place]) 02/22/2017  . Hypomagnesemia 04/08/2016  . B12 deficiency 01/30/2016  . Urinary retention 07/30/2015  . Primary osteoarthritis of right knee 07/09/2015  . Personal history of colonic adenomas 01/16/2013  . Pernicious anemia 02/22/2012  . Anemia   . Diabetes mellitus type II, controlled ([place]) 01/05/2011  . Hyperlipidemia associated with type 2 diabetes mellitus ([place]) 01/05/2011  . Hypertension associated with diabetes ([place]) 01/05/2011  . Osteoarthritis 01/05/2011   Past Surgical History:  Procedure Laterality Date  . CATARACT EXTRACTION  08/2012 L, 09/2012 R  . COLONOSCOPY    . PROSTATE BIOPSY    . TONSILLECTOMY  1945  . TONSILLECTOMY    . TOTAL HIP ARTHROPLASTY  2011   Left  . TOTAL HIP ARTHROPLASTY Right 07/09/2015   Procedure: TOTAL HIP ARTHROPLASTY ANTERIOR APPROACH;  Surgeon: Melrose Nakayama, MD;  Location: [facility];  Service: Orthopedics;  Laterality: Right;  . TOTAL KNEE ARTHROPLASTY Right 07/21/2016   Procedure: RIGHT TOTAL KNEE ARTHROPLASTY;  Surgeon: Melrose Nakayama, MD;  Location: [facility];  Service: Orthopedics;  Laterality: Right;   Family History  Problem Relation Age of Onset  . Ovarian cancer Mother   . Hypertension Father   . Diabetes Maternal Grandfather   . Pneumonia Paternal Grandfather   . Ovarian cancer Sister   . Brain cancer Cousin   . Colon cancer Neg Hx   . Esophageal cancer Neg  Hx   . Liver cancer Neg Hx   . Pancreatic cancer Neg Hx   . Rectal cancer Neg Hx   . Stomach cancer Neg Hx     Outpatient Medications Prior to Visit  Medication Sig Dispense Refill  . amLODipine (NORVASC) 10 MG tablet Take 1 tablet (10 mg total) by mouth daily. 90 tablet 3  . atorvastatin (LIPITOR) 40 MG tablet Take 1 tablet (40 mg total) by mouth daily. 90 tablet 3  . blood glucose meter kit and supplies KIT Use to test blood sugar up to  twice a day. DX E11.09 1 each 0  . Cholecalciferol (VITAMIN D) 2000 units CAPS Take 1 capsule (2,000 Units total) by mouth every morning. 90 capsule 3  . Continuous Blood Gluc Receiver (FREESTYLE LIBRE 14 DAY READER) DEVI 1 Device by Does not apply route as directed. 1 each 2  . Continuous Blood Gluc Sensor (FREESTYLE LIBRE 14 DAY SENSOR) MISC 1 Device by Does not apply route every 14 (fourteen) days. 5 each 5  . Cyanocobalamin (VITAMIN B12 PO) Take 1 tablet by mouth daily.    Marland Kitchen glucose blood test strip Use as instructed to test blood sugar up to twice a day. DX:  E11.09 200 each 3  . Krill Oil 300 MG CAPS Take 500 mg by mouth daily.     . Lancets 53P MISC Delica Fine Lancets. Use to test blood sugar up to twice a day. DX E11.09 200 each 3  . lipase/protease/amylase (CREON) 36000 UNITS CPEP capsule Take by mouth.    Marland Kitchen lisinopril (ZESTRIL) 20 MG tablet Take 1 tablet (20 mg total) by mouth daily. 90 tablet 3  . Magnesium 100 MG CAPS Take 100 mg by mouth 2 (two) times daily.    . metFORMIN (GLUCOPHAGE-XR) 500 MG 24 hr tablet TAKE 2 TABLETS 2 TIMES     DAILY WITH MEALS 360 tablet 1  . Multiple Vitamins-Minerals (SENIOR MULTIVITAMIN PLUS) TABS Take 1 tablet by mouth daily.    . tamsulosin (FLOMAX) 0.4 MG CAPS capsule Take 1 capsule (0.4 mg total) by mouth at bedtime. (Patient taking differently: Take 0.4-0.8 mg by mouth See admin instructions. Take 0.4 mg by mouth daily. Starting 7 days before procedure take 0.82m by mouth daily) 14 capsule 0  . Triamcinolone Acetonide (NASACORT AQ NA) Place 1 spray into the nose daily as needed (allergies).    . magnesium 30 MG tablet Take 30 mg by mouth 2 (two) times daily.    . Probiotic Product (PROBIOTIC DAILY PO) Take by mouth. (Patient not taking: Reported on 03/31/2021)     Facility-Administered Medications Prior to Visit  Medication Dose Route Frequency Provider Last Rate Last Admin  . 0.9 %  sodium chloride infusion  500 mL Intravenous Once GGatha Mayer  MD        Allergies  Allergen Reactions  . Nsaids     Kidney Disease  . Sulfa Antibiotics Other (See Comments)    As child    Not sure rxn  . Thiazide-Type Diuretics Other (See Comments)    Unknown   Objective:   Today's Vitals   03/31/21 0835  BP: (!) 144/70  Pulse: 68  Temp: (!) 97.5 F (36.4 C)  TempSrc: Temporal  SpO2: 98%  Weight: 182 lb 12.8 oz (82.9 kg)  Height: _0  (1.803 m)   Body mass index is 25.5 kg/m.   General: Well developed, well nourished. No acute distress. Feet: 1+ lower leg edema. Skin intact. No  significant callus. Scant hair. DP pulses 2+, PT pulses 1+. 5-07  monofilament testing shows decreased sensation at the heels.  Psych: Alert and oriented. Normal mood and affect.  Health Maintenance Due  Topic Date Due  . FOOT EXAM  09/27/2019  . COVID-19 Vaccine (4 - Booster for Pfizer series) 11/12/2020   Lab Results Lab Results  Component Value Date   HGBA1C 6.5 12/30/2020   Lab Results  Component Value Date   CHOL 119 12/30/2020   HDL 36.60 (L) 12/30/2020   LDLCALC 53 12/30/2020   LDLDIRECT 99.0 05/01/2020   TRIG 147.0 12/30/2020   CHOLHDL 3 12/30/2020   Imaging: CT of Abdomen with contrast (01/29/2021) IMPRESSION: 1. Stable appearance of the pancreas with relative atrophy of the pancreatic tail and relatively higher amount of parenchymal tissue in the pancreatic body and head. No contour change or significant change in density of components of the pancreas to suggest an underlying pancreatic mass. No calcifications of chronic pancreatitis. 2. Other imaging findings of potential clinical significance: Coronary atherosclerosis. Old granulomatous disease. Levoconvex lumbar scoliosis with rotary component. Lumbar spondylosis and degenerative disc disease causing multilevel impingement. Stable benign-appearing Bosniak category 1 and category 2 renal cysts. 3. Aortic atherosclerosis.  Assessment & Plan:   1. 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 Continue metformin for now.  - Glucose, random - Hemoglobin A1c  2. Hypertension associated with diabetes ([place]) Blood pressure is a little high today. He admits to some situational anxiety of his health issues, which may have impacted his BP today. We will watch this for now.  3. Hyperlipidemia associated with type 2 diabetes mellitus ([place]) Lipids at goal on atorvastatin. We discussed the incidental findings of coronary and aortic atherosclerosis on his recent CT scan. He had been quite worried about this finding based on some poor communication about this (notes this was not well explained by radiology and was told to go to an ER if he felt dizzy). I reassured him. We are treating him medically at this point.  4. Malignant neoplasm of prostate [facility]) Reviewed  consult notes, scan, and 2 biopsy reports. Anticipate starting radiation therapy int he coming weeks. He will continue to follow with Dr. Claudia Desanctis (urology)  5. Pancreatic insufficiency On Creon. Mr. Boodram will continue to work to determine the appropriate tabs per meal.  Haydee Salter, MD

## 2021-04-01 ENCOUNTER — Telehealth: Payer: Self-pay | Admitting: *Deleted

## 2021-04-01 NOTE — Telephone Encounter (Signed)
Returned patient's phone call, spoke with patient 

## 2021-04-09 DIAGNOSIS — C61 Malignant neoplasm of prostate: Secondary | ICD-10-CM | POA: Diagnosis not present

## 2021-04-10 ENCOUNTER — Telehealth: Payer: Self-pay | Admitting: *Deleted

## 2021-04-10 NOTE — Telephone Encounter (Signed)
Called patient to remind of sim appt. for 04-11-21- arrival time- 1:45 pm @ [facility], lvm for a return call

## 2021-04-11 ENCOUNTER — Ambulatory Visit
Admission: RE | Admit: 2021-04-11 | Discharge: 2021-04-11 | Disposition: A | Discharge status: Home / Self Care | Payer: Medicare Other | Source: Ambulatory Visit | Attending: Radiation Oncology | Admitting: Radiation Oncology

## 2021-04-11 ENCOUNTER — Other Ambulatory Visit: Payer: Self-pay

## 2021-04-11 DIAGNOSIS — C61 Malignant neoplasm of prostate: Secondary | ICD-10-CM | POA: Insufficient documentation

## 2021-04-11 NOTE — Progress Notes (Signed)
  Radiation Oncology         [phone] ________________________________  Name: SEWARD CORAN MRN: [id number]  Date: 04/11/2021  DOB: 1941/09/24  SIMULATION AND TREATMENT PLANNING NOTE    ICD-10-CM   1. Malignant neoplasm of prostate ([place])  C61     DIAGNOSIS:  80 y.o. gentleman with Stage T2a adenocarcinoma of the prostate with Gleason score of 3+4, and PSA of 6.36.  NARRATIVE:  The patient was brought to the [facility].  Identity was confirmed.  All relevant records and images related to the planned course of therapy were reviewed.  The patient freely provided informed written consent to proceed with treatment after reviewing the details related to the planned course of therapy. The consent form was witnessed and verified by the simulation staff.  Then, the patient was set-up in a stable reproducible supine position for radiation therapy.  A vacuum lock pillow device was custom fabricated to position his legs in a reproducible immobilized position.  Then, I performed a urethrogram under sterile conditions to identify the prostatic apex.  CT images were obtained.  Surface markings were placed.  The CT images were loaded into the planning software.  Then the prostate target and avoidance structures including the rectum, bladder, bowel and hips were contoured.  Treatment planning then occurred.  The radiation prescription was entered and confirmed.  A total of one complex treatment devices was fabricated. I have requested : Intensity Modulated Radiotherapy (IMRT) is medically necessary for this case for the following reason:  Rectal sparing.Marland Kitchen  PLAN:  The patient will receive 70 Gy in 28 fractions.  ________________________________  Sheral Apley Tammi Klippel, M.D.

## 2021-04-13 ENCOUNTER — Other Ambulatory Visit: Payer: Self-pay | Admitting: Family Medicine

## 2021-04-13 DIAGNOSIS — I152 Hypertension secondary to endocrine disorders: Secondary | ICD-10-CM

## 2021-04-13 DIAGNOSIS — E1159 Type 2 diabetes mellitus with other circulatory complications: Secondary | ICD-10-CM

## 2021-04-15 ENCOUNTER — Ambulatory Visit: Payer: Medicare Other | Admitting: Radiation Oncology

## 2021-04-16 DIAGNOSIS — M1712 Unilateral primary osteoarthritis, left knee: Secondary | ICD-10-CM | POA: Diagnosis not present

## 2021-04-17 DIAGNOSIS — C61 Malignant neoplasm of prostate: Secondary | ICD-10-CM | POA: Insufficient documentation

## 2021-04-21 DIAGNOSIS — M1712 Unilateral primary osteoarthritis, left knee: Secondary | ICD-10-CM | POA: Diagnosis not present

## 2021-04-24 ENCOUNTER — Ambulatory Visit
Admission: RE | Admit: 2021-04-24 | Discharge: 2021-04-24 | Disposition: A | Discharge status: Home / Self Care | Payer: Medicare Other | Source: Ambulatory Visit | Attending: Radiation Oncology | Admitting: Radiation Oncology

## 2021-04-24 ENCOUNTER — Other Ambulatory Visit: Payer: Self-pay

## 2021-04-24 DIAGNOSIS — C61 Malignant neoplasm of prostate: Secondary | ICD-10-CM | POA: Diagnosis not present

## 2021-04-24 NOTE — Progress Notes (Signed)
Pt here for patient teaching.     Pt given Radiation and You booklet.     Reviewed areas of pertinence such as diarrhea, fatigue, hair loss, nausea and vomiting, sexual and fertility changes, skin changes, and urinary and bladder changes .   Pt able to give teach back of to pat skin, use unscented/gentle soap, and have Imodium on hand,avoid applying anything to skin within 4 hours of treatment.   Pt verbalizes understanding of information given and will contact nursing with any questions or concerns.

## 2021-04-25 ENCOUNTER — Encounter: Payer: Self-pay | Admitting: Family Medicine

## 2021-04-25 ENCOUNTER — Other Ambulatory Visit: Payer: Self-pay

## 2021-04-25 ENCOUNTER — Ambulatory Visit
Admission: RE | Admit: 2021-04-25 | Discharge: 2021-04-25 | Disposition: A | Discharge status: Home / Self Care | Payer: Medicare Other | Source: Ambulatory Visit | Attending: Radiation Oncology | Admitting: Radiation Oncology

## 2021-04-25 DIAGNOSIS — M47816 Spondylosis without myelopathy or radiculopathy, lumbar region: Secondary | ICD-10-CM | POA: Insufficient documentation

## 2021-04-25 DIAGNOSIS — Z96651 Presence of right artificial knee joint: Secondary | ICD-10-CM | POA: Insufficient documentation

## 2021-04-25 DIAGNOSIS — C61 Malignant neoplasm of prostate: Secondary | ICD-10-CM | POA: Diagnosis not present

## 2021-04-25 DIAGNOSIS — Z96641 Presence of right artificial hip joint: Secondary | ICD-10-CM | POA: Insufficient documentation

## 2021-04-25 DIAGNOSIS — Z96642 Presence of left artificial hip joint: Secondary | ICD-10-CM | POA: Insufficient documentation

## 2021-04-28 ENCOUNTER — Ambulatory Visit
Admission: RE | Admit: 2021-04-28 | Discharge: 2021-04-28 | Disposition: A | Discharge status: Home / Self Care | Payer: Medicare Other | Source: Ambulatory Visit | Attending: Radiation Oncology | Admitting: Radiation Oncology

## 2021-04-28 ENCOUNTER — Other Ambulatory Visit: Payer: Self-pay

## 2021-04-28 ENCOUNTER — Telehealth: Payer: Self-pay | Admitting: *Deleted

## 2021-04-28 DIAGNOSIS — C61 Malignant neoplasm of prostate: Secondary | ICD-10-CM | POA: Diagnosis not present

## 2021-04-28 DIAGNOSIS — M1712 Unilateral primary osteoarthritis, left knee: Secondary | ICD-10-CM | POA: Diagnosis not present

## 2021-04-28 NOTE — Telephone Encounter (Signed)
Returned patient's phone call, spoke with patient 

## 2021-04-29 ENCOUNTER — Ambulatory Visit
Admission: RE | Admit: 2021-04-29 | Discharge: 2021-04-29 | Disposition: A | Discharge status: Home / Self Care | Payer: Medicare Other | Source: Ambulatory Visit | Attending: Radiation Oncology | Admitting: Radiation Oncology

## 2021-04-29 DIAGNOSIS — C61 Malignant neoplasm of prostate: Secondary | ICD-10-CM | POA: Diagnosis not present

## 2021-04-30 ENCOUNTER — Ambulatory Visit
Admission: RE | Admit: 2021-04-30 | Discharge: 2021-04-30 | Disposition: A | Discharge status: Home / Self Care | Payer: Medicare Other | Source: Ambulatory Visit | Attending: Radiation Oncology | Admitting: Radiation Oncology

## 2021-04-30 ENCOUNTER — Other Ambulatory Visit: Payer: Self-pay

## 2021-04-30 DIAGNOSIS — C61 Malignant neoplasm of prostate: Secondary | ICD-10-CM | POA: Diagnosis not present

## 2021-05-01 ENCOUNTER — Ambulatory Visit
Admission: RE | Admit: 2021-05-01 | Discharge: 2021-05-01 | Disposition: A | Discharge status: Home / Self Care | Payer: Medicare Other | Source: Ambulatory Visit | Attending: Radiation Oncology | Admitting: Radiation Oncology

## 2021-05-01 DIAGNOSIS — C61 Malignant neoplasm of prostate: Secondary | ICD-10-CM | POA: Diagnosis not present

## 2021-05-02 ENCOUNTER — Ambulatory Visit
Admission: RE | Admit: 2021-05-02 | Discharge: 2021-05-02 | Disposition: A | Discharge status: Home / Self Care | Payer: Medicare Other | Source: Ambulatory Visit | Attending: Radiation Oncology | Admitting: Radiation Oncology

## 2021-05-02 ENCOUNTER — Other Ambulatory Visit: Payer: Self-pay

## 2021-05-02 DIAGNOSIS — C61 Malignant neoplasm of prostate: Secondary | ICD-10-CM | POA: Diagnosis not present

## 2021-05-05 ENCOUNTER — Ambulatory Visit
Admission: RE | Admit: 2021-05-05 | Discharge: 2021-05-05 | Disposition: A | Discharge status: Home / Self Care | Payer: Medicare Other | Source: Ambulatory Visit | Attending: Radiation Oncology | Admitting: Radiation Oncology

## 2021-05-05 ENCOUNTER — Other Ambulatory Visit: Payer: Self-pay

## 2021-05-05 DIAGNOSIS — C61 Malignant neoplasm of prostate: Secondary | ICD-10-CM | POA: Diagnosis not present

## 2021-05-05 DIAGNOSIS — M1712 Unilateral primary osteoarthritis, left knee: Secondary | ICD-10-CM | POA: Diagnosis not present

## 2021-05-06 ENCOUNTER — Ambulatory Visit
Admission: RE | Admit: 2021-05-06 | Discharge: 2021-05-06 | Disposition: A | Discharge status: Home / Self Care | Payer: Medicare Other | Source: Ambulatory Visit | Attending: Radiation Oncology | Admitting: Radiation Oncology

## 2021-05-06 DIAGNOSIS — C61 Malignant neoplasm of prostate: Secondary | ICD-10-CM | POA: Diagnosis not present

## 2021-05-07 ENCOUNTER — Ambulatory Visit
Admission: RE | Admit: 2021-05-07 | Discharge: 2021-05-07 | Disposition: A | Discharge status: Home / Self Care | Payer: Medicare Other | Source: Ambulatory Visit | Attending: Radiation Oncology | Admitting: Radiation Oncology

## 2021-05-07 DIAGNOSIS — C61 Malignant neoplasm of prostate: Secondary | ICD-10-CM | POA: Diagnosis not present

## 2021-05-08 ENCOUNTER — Other Ambulatory Visit: Payer: Self-pay

## 2021-05-08 ENCOUNTER — Ambulatory Visit
Admission: RE | Admit: 2021-05-08 | Discharge: 2021-05-08 | Disposition: A | Discharge status: Home / Self Care | Payer: Medicare Other | Source: Ambulatory Visit | Attending: Radiation Oncology | Admitting: Radiation Oncology

## 2021-05-08 DIAGNOSIS — C61 Malignant neoplasm of prostate: Secondary | ICD-10-CM | POA: Diagnosis not present

## 2021-05-09 ENCOUNTER — Ambulatory Visit
Admission: RE | Admit: 2021-05-09 | Discharge: 2021-05-09 | Disposition: A | Discharge status: Home / Self Care | Payer: Medicare Other | Source: Ambulatory Visit | Attending: Radiation Oncology | Admitting: Radiation Oncology

## 2021-05-09 DIAGNOSIS — C61 Malignant neoplasm of prostate: Secondary | ICD-10-CM | POA: Diagnosis not present

## 2021-05-12 ENCOUNTER — Other Ambulatory Visit: Payer: Self-pay

## 2021-05-12 ENCOUNTER — Ambulatory Visit
Admission: RE | Admit: 2021-05-12 | Discharge: 2021-05-12 | Disposition: A | Discharge status: Home / Self Care | Payer: Medicare Other | Source: Ambulatory Visit | Attending: Radiation Oncology | Admitting: Radiation Oncology

## 2021-05-12 DIAGNOSIS — C61 Malignant neoplasm of prostate: Secondary | ICD-10-CM | POA: Diagnosis not present

## 2021-05-13 ENCOUNTER — Ambulatory Visit
Admission: RE | Admit: 2021-05-13 | Discharge: 2021-05-13 | Disposition: A | Discharge status: Home / Self Care | Payer: Medicare Other | Source: Ambulatory Visit | Attending: Radiation Oncology | Admitting: Radiation Oncology

## 2021-05-13 DIAGNOSIS — C61 Malignant neoplasm of prostate: Secondary | ICD-10-CM | POA: Diagnosis not present

## 2021-05-14 ENCOUNTER — Other Ambulatory Visit: Payer: Self-pay

## 2021-05-14 ENCOUNTER — Ambulatory Visit
Admission: RE | Admit: 2021-05-14 | Discharge: 2021-05-14 | Disposition: A | Discharge status: Home / Self Care | Payer: Medicare Other | Source: Ambulatory Visit | Attending: Radiation Oncology | Admitting: Radiation Oncology

## 2021-05-14 DIAGNOSIS — C61 Malignant neoplasm of prostate: Secondary | ICD-10-CM | POA: Diagnosis not present

## 2021-05-15 ENCOUNTER — Ambulatory Visit
Admission: RE | Admit: 2021-05-15 | Discharge: 2021-05-15 | Disposition: A | Discharge status: Home / Self Care | Payer: Medicare Other | Source: Ambulatory Visit | Attending: Radiation Oncology | Admitting: Radiation Oncology

## 2021-05-15 DIAGNOSIS — C61 Malignant neoplasm of prostate: Secondary | ICD-10-CM | POA: Diagnosis not present

## 2021-05-16 ENCOUNTER — Ambulatory Visit
Admission: RE | Admit: 2021-05-16 | Discharge: 2021-05-16 | Disposition: A | Discharge status: Home / Self Care | Payer: Medicare Other | Source: Ambulatory Visit | Attending: Radiation Oncology | Admitting: Radiation Oncology

## 2021-05-16 ENCOUNTER — Other Ambulatory Visit: Payer: Self-pay

## 2021-05-16 DIAGNOSIS — C61 Malignant neoplasm of prostate: Secondary | ICD-10-CM | POA: Insufficient documentation

## 2021-05-20 ENCOUNTER — Ambulatory Visit
Admission: RE | Admit: 2021-05-20 | Discharge: 2021-05-20 | Disposition: A | Discharge status: Home / Self Care | Payer: Medicare Other | Source: Ambulatory Visit | Attending: Radiation Oncology | Admitting: Radiation Oncology

## 2021-05-20 ENCOUNTER — Telehealth: Payer: Self-pay

## 2021-05-20 ENCOUNTER — Other Ambulatory Visit: Payer: Self-pay | Admitting: Radiation Oncology

## 2021-05-20 ENCOUNTER — Other Ambulatory Visit: Payer: Self-pay

## 2021-05-20 DIAGNOSIS — C61 Malignant neoplasm of prostate: Secondary | ICD-10-CM | POA: Diagnosis not present

## 2021-05-20 MED ORDER — HYDROCORTISONE ACETATE 25 MG RE SUPP: 25.0000 mg | Freq: Two times a day (BID) | RECTAL | 0 refills | Status: DC

## 2021-05-20 NOTE — Telephone Encounter (Signed)
Received message from nursing secretary that patient requested a return call regarding symptoms/concerns.   Returned patient's call and was informed that from Friday evening onward he's been experiencing rectal spasms/cramping. States they happen randomly (though most frequently when he's sitting for a prolonged period, while using the bathroom, and with some movement) and usually last for about 5 minutes. Reports he had an episode of looser stools last Thursday 6/30 and Sunday 7/3. Denies any signs of blood in his stool or urine, but would like to know if anything could be prescribed to help manage symptoms. Informed patient that Dr. Tammi Klippel and Freeman Caldron PA-C are both out of the office this week, so I would need to consult Dr. Johny Shears partner Dr. Lisbeth Renshaw. Informed patient I would either call him back with an update, or come speak to him on the treatment machine after his radiation today. Patient verbalized understanding and agreement.   Relayed above information to Dr. Lisbeth Renshaw who advised for patient to try warm sitz baths as well as an anusol suppository. Brought sitz bath with instructions over to patient on L1, and reviewed recommendations from Dr. Lisbeth Renshaw with patient. Provided patient with my direct call back number should he have any other questions/concerns, but also encouraged him to give staff an update when he comes around for his weekly PUT this Friday 05/23/21. Patient verbalized understanding and agreement of plan. No other needs identified at this time

## 2021-05-21 ENCOUNTER — Ambulatory Visit
Admission: RE | Admit: 2021-05-21 | Discharge: 2021-05-21 | Disposition: A | Discharge status: Home / Self Care | Payer: Medicare Other | Source: Ambulatory Visit | Attending: Radiation Oncology | Admitting: Radiation Oncology

## 2021-05-21 DIAGNOSIS — C61 Malignant neoplasm of prostate: Secondary | ICD-10-CM | POA: Diagnosis not present

## 2021-05-22 ENCOUNTER — Ambulatory Visit
Admission: RE | Admit: 2021-05-22 | Discharge: 2021-05-22 | Disposition: A | Discharge status: Home / Self Care | Payer: Medicare Other | Source: Ambulatory Visit | Attending: Radiation Oncology | Admitting: Radiation Oncology

## 2021-05-22 ENCOUNTER — Other Ambulatory Visit: Payer: Self-pay

## 2021-05-22 DIAGNOSIS — C61 Malignant neoplasm of prostate: Secondary | ICD-10-CM | POA: Diagnosis not present

## 2021-05-23 ENCOUNTER — Ambulatory Visit
Admission: RE | Admit: 2021-05-23 | Discharge: 2021-05-23 | Disposition: A | Discharge status: Home / Self Care | Payer: Medicare Other | Source: Ambulatory Visit | Attending: Radiation Oncology | Admitting: Radiation Oncology

## 2021-05-23 DIAGNOSIS — C61 Malignant neoplasm of prostate: Secondary | ICD-10-CM | POA: Diagnosis not present

## 2021-05-26 ENCOUNTER — Other Ambulatory Visit: Payer: Self-pay

## 2021-05-26 ENCOUNTER — Ambulatory Visit
Admission: RE | Admit: 2021-05-26 | Discharge: 2021-05-26 | Disposition: A | Discharge status: Home / Self Care | Payer: Medicare Other | Source: Ambulatory Visit | Attending: Radiation Oncology | Admitting: Radiation Oncology

## 2021-05-26 DIAGNOSIS — C61 Malignant neoplasm of prostate: Secondary | ICD-10-CM | POA: Diagnosis not present

## 2021-05-27 ENCOUNTER — Ambulatory Visit
Admission: RE | Admit: 2021-05-27 | Discharge: 2021-05-27 | Disposition: A | Discharge status: Home / Self Care | Payer: Medicare Other | Source: Ambulatory Visit | Attending: Radiation Oncology | Admitting: Radiation Oncology

## 2021-05-27 DIAGNOSIS — C61 Malignant neoplasm of prostate: Secondary | ICD-10-CM | POA: Diagnosis not present

## 2021-05-28 ENCOUNTER — Ambulatory Visit
Admission: RE | Admit: 2021-05-28 | Discharge: 2021-05-28 | Disposition: A | Discharge status: Home / Self Care | Payer: Medicare Other | Source: Ambulatory Visit | Attending: Radiation Oncology | Admitting: Radiation Oncology

## 2021-05-28 ENCOUNTER — Other Ambulatory Visit: Payer: Self-pay

## 2021-05-28 DIAGNOSIS — C61 Malignant neoplasm of prostate: Secondary | ICD-10-CM | POA: Diagnosis not present

## 2021-05-29 ENCOUNTER — Ambulatory Visit
Admission: RE | Admit: 2021-05-29 | Discharge: 2021-05-29 | Disposition: A | Discharge status: Home / Self Care | Payer: Medicare Other | Source: Ambulatory Visit | Attending: Radiation Oncology | Admitting: Radiation Oncology

## 2021-05-29 DIAGNOSIS — C61 Malignant neoplasm of prostate: Secondary | ICD-10-CM | POA: Diagnosis not present

## 2021-05-30 ENCOUNTER — Other Ambulatory Visit: Payer: Self-pay

## 2021-05-30 ENCOUNTER — Ambulatory Visit
Admission: RE | Admit: 2021-05-30 | Discharge: 2021-05-30 | Disposition: A | Discharge status: Home / Self Care | Payer: Medicare Other | Source: Ambulatory Visit | Attending: Radiation Oncology | Admitting: Radiation Oncology

## 2021-05-30 DIAGNOSIS — C61 Malignant neoplasm of prostate: Secondary | ICD-10-CM | POA: Diagnosis not present

## 2021-06-02 ENCOUNTER — Other Ambulatory Visit: Payer: Self-pay

## 2021-06-02 ENCOUNTER — Other Ambulatory Visit: Payer: Self-pay | Admitting: *Deleted

## 2021-06-02 ENCOUNTER — Other Ambulatory Visit: Payer: Self-pay | Admitting: Radiation Oncology

## 2021-06-02 ENCOUNTER — Ambulatory Visit
Admission: RE | Admit: 2021-06-02 | Discharge: 2021-06-02 | Disposition: A | Discharge status: Home / Self Care | Payer: Medicare Other | Source: Ambulatory Visit | Attending: Radiation Oncology | Admitting: Radiation Oncology

## 2021-06-02 DIAGNOSIS — C61 Malignant neoplasm of prostate: Secondary | ICD-10-CM | POA: Diagnosis not present

## 2021-06-02 DIAGNOSIS — E1169 Type 2 diabetes mellitus with other specified complication: Secondary | ICD-10-CM

## 2021-06-02 MED ORDER — ATORVASTATIN CALCIUM 40 MG PO TABS: 40.0000 mg | ORAL_TABLET | Freq: Every day | ORAL | 3 refills | Status: DC

## 2021-06-02 MED ORDER — HYDROCORTISONE ACETATE 25 MG RE SUPP: 25.0000 mg | Freq: Two times a day (BID) | RECTAL | 0 refills | Status: DC

## 2021-06-03 ENCOUNTER — Encounter: Payer: Self-pay | Admitting: Radiation Oncology

## 2021-06-03 ENCOUNTER — Ambulatory Visit
Admission: RE | Admit: 2021-06-03 | Discharge: 2021-06-03 | Disposition: A | Discharge status: Home / Self Care | Payer: Medicare Other | Source: Ambulatory Visit | Attending: Radiation Oncology | Admitting: Radiation Oncology

## 2021-06-03 DIAGNOSIS — C61 Malignant neoplasm of prostate: Secondary | ICD-10-CM

## 2021-06-11 ENCOUNTER — Ambulatory Visit: Payer: Medicare Other

## 2021-06-17 ENCOUNTER — Ambulatory Visit (INDEPENDENT_AMBULATORY_CARE_PROVIDER_SITE_OTHER): Payer: Medicare Other | Admitting: *Deleted

## 2021-06-17 DIAGNOSIS — Z0000 Encounter for general adult medical examination without abnormal findings: Secondary | ICD-10-CM

## 2021-06-17 NOTE — Patient Instructions (Signed)
Nathan Gomez , Thank you for taking time to come for your Medicare Wellness Visit. I appreciate your ongoing commitment to your health goals. Please review the following plan we discussed and let me know if I can assist you in the future.   Screening recommendations/referrals: Colonoscopy: no longer required Recommended yearly ophthalmology/optometry visit for glaucoma screening and checkup Recommended yearly dental visit for hygiene and checkup  Vaccinations: Influenza vaccine: up to date Pneumococcal vaccine: up to date Tdap vaccine: up to date Shingles vaccine: up to date    Advanced directives: on file  Conditions/risks identified: NA  Next appointment: 07-01-2021 @ 8:30 Dr. Gena Fray  Preventive Care 36 Years and Older, Male Preventive care refers to lifestyle choices and visits with your health care provider that can promote health and wellness. What does preventive care include? A yearly physical exam. This is also called an annual well check. Dental exams once or twice a year. Routine eye exams. Ask your health care provider how often you should have your eyes checked. Personal lifestyle choices, including: Daily care of your teeth and gums. Regular physical activity. Eating a healthy diet. Avoiding tobacco and drug use. Limiting alcohol use. Practicing safe sex. Taking low doses of aspirin every day. Taking vitamin and mineral supplements as recommended by your health care provider. What happens during an annual well check? The services and screenings done by your health care provider during your annual well check will depend on your age, overall health, lifestyle risk factors, and family history of disease. Counseling  Your health care provider may ask you questions about your: Alcohol use. Tobacco use. Drug use. Emotional well-being. Home and relationship well-being. Sexual activity. Eating habits. History of falls. Memory and ability to understand (cognition). Work  and work Statistician. Screening  You may have the following tests or measurements: Height, weight, and BMI. Blood pressure. Lipid and cholesterol levels. These may be checked every 5 years, or more frequently if you are over 43 years old. Skin check. Lung cancer screening. You may have this screening every year starting at age 48 if you have a 30-pack-year history of smoking and currently smoke or have quit within the past 15 years. Fecal occult blood test (FOBT) of the stool. You may have this test every year starting at age 73. Flexible sigmoidoscopy or colonoscopy. You may have a sigmoidoscopy every 5 years or a colonoscopy every 10 years starting at age 53. Prostate cancer screening. Recommendations will vary depending on your family history and other risks. Hepatitis C blood test. Hepatitis B blood test. Sexually transmitted disease (STD) testing. Diabetes screening. This is done by checking your blood sugar (glucose) after you have not eaten for a while (fasting). You may have this done every 1-3 years. Abdominal aortic aneurysm (AAA) screening. You may need this if you are a current or former smoker. Osteoporosis. You may be screened starting at age 45 if you are at high risk. Talk with your health care provider about your test results, treatment options, and if necessary, the need for more tests. Vaccines  Your health care provider may recommend certain vaccines, such as: Influenza vaccine. This is recommended every year. Tetanus, diphtheria, and acellular pertussis (Tdap, Td) vaccine. You may need a Td booster every 10 years. Zoster vaccine. You may need this after age 78. Pneumococcal 13-valent conjugate (PCV13) vaccine. One dose is recommended after age 65. Pneumococcal polysaccharide (PPSV23) vaccine. One dose is recommended after age 74. Talk to your health care provider about which screenings  and vaccines you need and how often you need them. This information is not intended  to replace advice given to you by your health care provider. Make sure you discuss any questions you have with your health care provider. Document Released: 11/29/2015 Document Revised: 07/22/2016 Document Reviewed: 09/03/2015 Elsevier Interactive Patient Education  2017 [place] Prevention in the Home Falls can cause injuries. They can happen to people of all ages. There are many things you can do to make your home safe and to help prevent falls. What can I do on the outside of my home? Regularly fix the edges of walkways and driveways and fix any cracks. Remove anything that might make you trip as you walk through a door, such as a raised step or threshold. Trim any bushes or trees on the path to your home. Use bright outdoor lighting. Clear any walking paths of anything that might make someone trip, such as rocks or tools. Regularly check to see if handrails are loose or broken. Make sure that both sides of any steps have handrails. Any raised decks and porches should have guardrails on the edges. Have any leaves, snow, or ice cleared regularly. Use sand or salt on walking paths during winter. Clean up any spills in your garage right away. This includes oil or grease spills. What can I do in the bathroom? Use night lights. Install grab bars by the toilet and in the tub and shower. Do not use towel bars as grab bars. Use non-skid mats or decals in the tub or shower. If you need to sit down in the shower, use a plastic, non-slip stool. Keep the floor dry. Clean up any water that spills on the floor as soon as it happens. Remove soap buildup in the tub or shower regularly. Attach bath mats securely with double-sided non-slip rug tape. Do not have throw rugs and other things on the floor that can make you trip. What can I do in the bedroom? Use night lights. Make sure that you have a light by your bed that is easy to reach. Do not use any sheets or blankets that are too big  for your bed. They should not hang down onto the floor. Have a firm chair that has side arms. You can use this for support while you get dressed. Do not have throw rugs and other things on the floor that can make you trip. What can I do in the kitchen? Clean up any spills right away. Avoid walking on wet floors. Keep items that you use a lot in easy-to-reach places. If you need to reach something above you, use a strong step stool that has a grab bar. Keep electrical cords out of the way. Do not use floor polish or wax that makes floors slippery. If you must use wax, use non-skid floor wax. Do not have throw rugs and other things on the floor that can make you trip. What can I do with my stairs? Do not leave any items on the stairs. Make sure that there are handrails on both sides of the stairs and use them. Fix handrails that are broken or loose. Make sure that handrails are as long as the stairways. Check any carpeting to make sure that it is firmly attached to the stairs. Fix any carpet that is loose or worn. Avoid having throw rugs at the top or bottom of the stairs. If you do have throw rugs, attach them to the floor with carpet tape.  Make sure that you have a light switch at the top of the stairs and the bottom of the stairs. If you do not have them, ask someone to add them for you. What else can I do to help prevent falls? Wear shoes that: Do not have high heels. Have rubber bottoms. Are comfortable and fit you well. Are closed at the toe. Do not wear sandals. If you use a stepladder: Make sure that it is fully opened. Do not climb a closed stepladder. Make sure that both sides of the stepladder are locked into place. Ask someone to hold it for you, if possible. Clearly mark and make sure that you can see: Any grab bars or handrails. First and last steps. Where the edge of each step is. Use tools that help you move around (mobility aids) if they are needed. These  include: Canes. Walkers. Scooters. Crutches. Turn on the lights when you go into a dark area. Replace any light bulbs as soon as they burn out. Set up your furniture so you have a clear path. Avoid moving your furniture around. If any of your floors are uneven, fix them. If there are any pets around you, be aware of where they are. Review your medicines with your doctor. Some medicines can make you feel dizzy. This can increase your chance of falling. Ask your doctor what other things that you can do to help prevent falls. This information is not intended to replace advice given to you by your health care provider. Make sure you discuss any questions you have with your health care provider. Document Released: 08/29/2009 Document Revised: 04/09/2016 Document Reviewed: 12/07/2014 Elsevier Interactive Patient Education  2017 [employer].

## 2021-06-17 NOTE — Progress Notes (Signed)
Subjective:   Nathan Gomez is a 80 y.o. male who presents for Medicare Annual/Subsequent preventive examination.  I connected with  [name] on 06/17/21 by a video enabled telemedicine application and verified that I am speaking with the correct person using two identifiers.   I discussed the limitations of evaluation and management by telemedicine. The patient expressed understanding and agreed to proceed.     Review of Systems    NA Cardiac Risk Factors include: advanced age (>6mn, >>69women);diabetes mellitus;hypertension;dyslipidemia;male gender     Objective:    Today's Vitals   There is no height or weight on file to calculate BMI.  Advanced Directives 06/17/2021 03/18/2021 06/05/2020 05/13/2017 04/08/2017 07/21/2016 07/10/2016  Does Patient Have a Medical Advance Directive? _0  - Yes  Type of AParamedicof [place]Living will [place]Living will [place]Living will - [place]Living will - HVan Al[place]Living will  Does patient want to make changes to medical advance directive? - No - Patient declined - - - - No - Patient declined  Copy of HWiotain Chart? No - copy requested No - copy requested Yes - validated most recent copy scanned in chart (See row information) - - Yes No - copy requested    Current Medications (verified) Outpatient Encounter Medications as of 06/17/2021  Medication Sig   amLODipine (NORVASC) 10 MG tablet TAKE 1 TABLET DAILY   atorvastatin (LIPITOR) 40 MG tablet Take 1 tablet (40 mg total) by mouth daily.   blood glucose meter kit and supplies KIT Use to test blood sugar up to twice a day. DX E11.09   Cholecalciferol (VITAMIN D) 2000 units CAPS Take 1 capsule (2,000 Units total) by mouth every morning.   Continuous Blood Gluc Receiver (FREESTYLE LIBRE 14 DAY READER) DEVI 1 Device by Does not apply route as  directed.   Continuous Blood Gluc Sensor (FREESTYLE LIBRE 14 DAY SENSOR) MISC 1 Device by Does not apply route every 14 (fourteen) days.   Cyanocobalamin (VITAMIN B12 PO) Take 1 tablet by mouth daily.   glucose blood test strip Use as instructed to test blood sugar up to twice a day. DX:  E11.09   Krill Oil 300 MG CAPS Take 500 mg by mouth daily.    Lancets 393AMISC Delica Fine Lancets. Use to test blood sugar up to twice a day. DX E11.09   lipase/protease/amylase (CREON) 36000 UNITS CPEP capsule Take by mouth.   lisinopril (ZESTRIL) 20 MG tablet TAKE 1 TABLET DAILY   Magnesium 100 MG CAPS Take 100 mg by mouth 2 (two) times daily.   metFORMIN (GLUCOPHAGE-XR) 500 MG 24 hr tablet TAKE 2 TABLETS 2 TIMES     DAILY WITH MEALS   Multiple Vitamins-Minerals (SENIOR MULTIVITAMIN PLUS) TABS Take 1 tablet by mouth daily.   tamsulosin (FLOMAX) 0.4 MG CAPS capsule Take 1 capsule (0.4 mg total) by mouth at bedtime. (Patient taking differently: Take 0.4-0.8 mg by mouth See admin instructions. Take 0.4 mg by mouth daily. Starting 7 days before procedure take 0.869mby mouth daily)   Triamcinolone Acetonide (NASACORT AQ NA) Place 1 spray into the nose daily as needed (allergies).   hydrocortisone (ANUSOL-HC) 25 MG suppository Place 1 suppository (25 mg total) rectally 2 (two) times daily. (Patient not taking: Reported on 06/17/2021)   Facility-Administered Encounter Medications as of 06/17/2021  Medication   0.9 %  sodium chloride infusion  Allergies (verified) Nsaids, Sulfa antibiotics, and Thiazide-type diuretics   History: Past Medical History:  Diagnosis Date   Achilles tendinitis    Allergy    Cataract    Chronic kidney disease    Complication of anesthesia    urinary retention   Degenerative tear of left medial meniscus 11/2014   Diabetes mellitus, type 2 (HCC)    Dyslipidemia    GERD (gastroesophageal reflux disease)    Heart murmur    child   Hyperlipidemia    Hypertension     Osteoarthritis    right knee   Personal history of colonic adenomas 01/16/2013   Plantar fasciitis    Prostate cancer [facility])    Past Surgical History:  Procedure Laterality Date   CATARACT EXTRACTION  08/2012 L, 09/2012 R   COLONOSCOPY     PROSTATE BIOPSY     TONSILLECTOMY  1945   TONSILLECTOMY     TOTAL HIP ARTHROPLASTY  2011   Left   TOTAL HIP ARTHROPLASTY Right 07/09/2015   Procedure: TOTAL HIP ARTHROPLASTY ANTERIOR APPROACH;  Surgeon: Melrose Nakayama, MD;  Location: [place];  Service: Orthopedics;  Laterality: Right;   TOTAL KNEE ARTHROPLASTY Right 07/21/2016   Procedure: RIGHT TOTAL KNEE ARTHROPLASTY;  Surgeon: Melrose Nakayama, MD;  Location: [place];  Service: Orthopedics;  Laterality: Right;   Family History  Problem Relation Age of Onset   Ovarian cancer Mother    Hypertension Father    Diabetes Maternal Grandfather    Pneumonia Paternal Grandfather    Ovarian cancer Sister    Brain cancer Cousin    Colon cancer Neg Hx    Esophageal cancer Neg Hx    Liver cancer Neg Hx    Pancreatic cancer Neg Hx    Rectal cancer Neg Hx    Stomach cancer Neg Hx    Social History   Socioeconomic History   Marital status: Married    Spouse name: Not on file   Number of children: Not on file   Years of education: Not on file   Highest education level: Not on file  Occupational History   Occupation: Retired  Tobacco Use   Smoking status: Former    Packs/day: 1.00    Years: 20.00    Pack years: 20.00    Types: Cigarettes    Quit date: 11/16/1986    Years since quitting: 34.6   Smokeless tobacco: Never  Vaping Use   Vaping Use: Never used  Substance and Sexual Activity   Alcohol use: Yes    Alcohol/week: 1.0 - 2.0 standard drink    Types: 1 - 2 Glasses of wine per week    Comment: occ   Drug use: No   Sexual activity: Yes  Other Topics Concern   Not on file  Social History Narrative   Not on file   Social Determinants of Health   Financial Resource Strain: Low Risk     Difficulty of Paying Living Expenses: Not hard at all  Food Insecurity: No Food Insecurity   Worried About Charity fundraiser in the Last Year: Never true   Ran Out of Food in the Last Year: Never true  Transportation Needs: No Transportation Needs   Lack of Transportation (Medical): No   Lack of Transportation (Non-Medical): No  Physical Activity: Inactive   Days of Exercise per Week: 0 days   Minutes of Exercise per Session: 0 min  Stress: No Stress Concern Present   Feeling of Stress : Not at all  Social Connections: Moderately Integrated   Frequency of Communication with Friends and Family: More than three times a week   Frequency of Social Gatherings with Friends and Family: Twice a week   Attends Religious Services: More than 4 times per year   Active Member of Clubs or Organizations: No   Attends Club or Organization Meetings: Never   Marital Status: Married    Tobacco Counseling Counseling given: Not Answered   Clinical Intake:  Pre-visit preparation completed: Yes  Pain : No/denies pain     Nutritional Risks: None Diabetes: Yes CBG done?: No Did pt. bring in CBG monitor from home?: No  How often do you need to have someone help you when you read instructions, pamphlets, or other written materials from your doctor or pharmacy?: 1 - Never  Diabetic?  Yes  Nutrition Risk Assessment:  Has the patient had any N/V/D within the last 2 months?  No  Does the patient have any non-healing wounds?  No  Has the patient had any unintentional weight loss or weight gain?  No   Diabetes:  Is the patient diabetic?  Yes  If diabetic, was a CBG obtained today?  No  Did the patient bring in their glucometer from home?  No  How often do you monitor your CBG's?  1 x daily.   Financial Strains and Diabetes Management:  Are you having any financial strains with the device, your supplies or your medication? No .  Does the patient want to be seen by Chronic Care Management  for management of their diabetes?  No  Would the patient like to be referred to a Nutritionist or for Diabetic Management?  No   Diabetic Exams:  Diabetic Eye Exam: Completed . Overdue for diabetic eye exam. Pt has been advised about the importance in completing this exam. A referral has been placed today. Message sent to referral coordinator for scheduling purposes. Advised pt to expect a call from office referred to regarding appt.  Diabetic Foot Exam: Completed . Pt has been advised about the importance in completing this exam.    Activities of Daily Living In your present state of health, do you have any difficulty performing the following activities: 06/17/2021  Hearing? N  Vision? N  Difficulty concentrating or making decisions? N  Walking or climbing stairs? N  Dressing or bathing? N  Doing errands, shopping? N  Preparing Food and eating ? N  Using the Toilet? N  In the past six months, have you accidently leaked urine? N  Do you have problems with loss of bowel control? N  Managing your Medications? N  Managing your Finances? N  Housekeeping or managing your Housekeeping? N  Some recent data might be hidden    Patient Care Team: Rudd, Stephen M, MD as PCP - General (Family Medicine) Bevis, Timothy, MD (Ophthalmology) Dalldorf, Peter, MD (Orthopedic Surgery) Lyles, Graham, MD as Consulting Physician (Ophthalmology) Tafeen, Stuart, MD as Consulting Physician (Dermatology) Pace, Maryellen D, MD as Consulting Physician (Urology) Buccini, Robert, MD as Consulting Physician (Gastroenterology)  Indicate any recent Medical Services you may have received from other than [facility] providers in the past year (date may be approximate).     Assessment:   This is a routine wellness examination for Nathan Gomez.  Hearing/Vision screen Hearing Screening - Comments:: No trouble hearing Vision Screening - Comments:: Up to date Dr. Lyle  Dietary issues and exercise activities  discussed: Current Exercise Habits: The patient does not participate in regular exercise at present     Goals Addressed             This Visit's Progress    Patient Stated   Not on track    Increase walking and lose weight      Patient Stated       To reduce night time urination trips       Depression Screen PHQ 2/9 Scores 06/17/2021 12/30/2020 06/05/2020 05/01/2020 11/29/2019 08/15/2019 03/27/2019  PHQ - 2 Score 0 1 1 1 0 1 0  PHQ- 9 Score - - - 2 0 1 0    Fall Risk Fall Risk  06/17/2021 12/30/2020 06/05/2020 05/01/2020 11/29/2019  Falls in the past year? 0 0 0 0 0  Comment - - - - -  Number falls in past yr: 0 - 0 - 0  Injury with Fall? 0 - 0 - 0  Follow up Falls evaluation completed;Falls prevention discussed - Falls prevention discussed - Falls evaluation completed    FALL RISK PREVENTION PERTAINING TO THE HOME:  Any stairs in or around the home? Yes  If so, are there any without handrails? Yes  Home free of loose throw rugs in walkways, pet beds, electrical cords, etc? Yes  Adequate lighting in your home to reduce risk of falls? Yes   ASSISTIVE DEVICES UTILIZED TO PREVENT FALLS:  Life alert? No  Use of a cane, walker or w/c? No  Grab bars in the bathroom? No  Shower chair or bench in shower? Yes  Elevated toilet seat or a handicapped toilet? Yes   TIMED UP AND GO:  Was the test performed? No .    Cognitive Function:  Normal cognitive status assessed by direct observation by this Nurse Health Advisor. No abnormalities found.       6CIT Screen 05/13/2017  What Year? 0 points  What month? 0 points  What time? 0 points  Count back from 20 0 points  Months in reverse 0 points  Repeat phrase 10 points  Total Score 10    Immunizations Immunization History  Administered Date(s) Administered   Fluad Quad(high Dose 65+) 08/15/2019   Influenza Split 09/28/2011   Influenza, High Dose Seasonal PF 07/12/2014, 07/30/2015, 08/25/2016, 09/13/2017, 08/12/2018   Influenza,  Seasonal, Injecte, Preservative Fre 07/17/2013   PFIZER(Purple Top)SARS-COV-2 Vaccination 12/23/2019, 01/17/2020, 08/13/2020   Pneumococcal Conjugate-13 01/29/2015   Pneumococcal Polysaccharide-23 08/01/2007, 09/16/2008   Td 05/26/2012   Tdap 04/16/2006   Zoster Recombinat (Shingrix) 05/13/2017, 10/22/2017   Zoster, Live 03/01/2009    TDAP status: Up to date  Flu Vaccine status: Up to date  Pneumococcal vaccine status: Up to date  Covid-19 vaccine status: Completed vaccines  Qualifies for Shingles Vaccine? No   Zostavax completed Yes   Shingrix Completed?: Yes  Screening Tests Health Maintenance  Topic Date Due   FOOT EXAM  09/27/2019   COVID-19 Vaccine (4 - Booster for Pfizer series) 11/12/2020   INFLUENZA VACCINE  06/16/2021   OPHTHALMOLOGY EXAM  08/12/2021   HEMOGLOBIN A1C  10/01/2021   TETANUS/TDAP  05/26/2022   COLONOSCOPY (Pts 45-49yrs Insurance coverage will need to be confirmed)  02/09/2023   PNA vac Low Risk Adult  Completed   Zoster Vaccines- Shingrix  Completed   HPV VACCINES  Aged Out    Health Maintenance  Health Maintenance Due  Topic Date Due   FOOT EXAM  09/27/2019   COVID-19 Vaccine (4 - Booster for Pfizer series) 11/12/2020   INFLUENZA VACCINE  06/16/2021    Colorectal cancer screening: No longer required.     Lung Cancer Screening: (Low Dose CT Chest recommended if Age 55-80 years, 30 pack-year currently smoking OR have quit w/in 15years.) does not qualify.   Lung Cancer Screening Referral:   Additional Screening:  Hepatitis C Screening: does not qualify; C  Vision Screening: Recommended annual ophthalmology exams for early detection of glaucoma and other disorders of the eye. Is the patient up to date with their annual eye exam?  Yes  Who is the provider or what is the name of the office in which the patient attends annual eye exams? Dr Lyle If pt is not established with a provider, would they like to be referred to a provider to establish  care? No .   Dental Screening: Recommended annual dental exams for proper oral hygiene  Community Resource Referral / Chronic Care Management: CRR required this visit?  No   CCM required this visit?  No      Plan:     I have personally reviewed and noted the following in the patient's chart:   Medical and social history Use of alcohol, tobacco or illicit drugs  Current medications and supplements including opioid prescriptions. Patient is not currently taking opioid prescriptions. Functional ability and status Nutritional status Physical activity Advanced directives List of other physicians Hospitalizations, surgeries, and ER visits in previous 12 months Vitals Screenings to include cognitive, depression, and falls Referrals and appointments  In addition, I have reviewed and discussed with patient certain preventive protocols, quality metrics, and best practice recommendations. A written personalized care plan for preventive services as well as general preventive health recommendations were provided to patient.     Julie Greer, LPN   06/17/2021   Nurse Notes: na    

## 2021-06-26 ENCOUNTER — Other Ambulatory Visit: Payer: Self-pay | Admitting: Family Medicine

## 2021-06-26 DIAGNOSIS — E1129 Type 2 diabetes mellitus with other diabetic kidney complication: Secondary | ICD-10-CM

## 2021-07-01 ENCOUNTER — Encounter: Payer: Self-pay | Admitting: Family Medicine

## 2021-07-01 ENCOUNTER — Other Ambulatory Visit: Payer: Self-pay

## 2021-07-01 ENCOUNTER — Ambulatory Visit (INDEPENDENT_AMBULATORY_CARE_PROVIDER_SITE_OTHER): Payer: Medicare Other | Admitting: Family Medicine

## 2021-07-01 VITALS — BP 132/60 | HR 69 | Temp 97.5°F | Ht 71.0 in | Wt 184.4 lb

## 2021-07-01 DIAGNOSIS — E7800 Pure hypercholesterolemia, unspecified: Secondary | ICD-10-CM

## 2021-07-01 DIAGNOSIS — E538 Deficiency of other specified B group vitamins: Secondary | ICD-10-CM | POA: Diagnosis not present

## 2021-07-01 DIAGNOSIS — N1832 Chronic kidney disease, stage 3b: Secondary | ICD-10-CM

## 2021-07-01 DIAGNOSIS — C61 Malignant neoplasm of prostate: Secondary | ICD-10-CM | POA: Diagnosis not present

## 2021-07-01 DIAGNOSIS — D510 Vitamin B12 deficiency anemia due to intrinsic factor deficiency: Secondary | ICD-10-CM | POA: Diagnosis not present

## 2021-07-01 DIAGNOSIS — E559 Vitamin D deficiency, unspecified: Secondary | ICD-10-CM

## 2021-07-01 DIAGNOSIS — E1129 Type 2 diabetes mellitus with other diabetic kidney complication: Secondary | ICD-10-CM

## 2021-07-01 DIAGNOSIS — I10 Essential (primary) hypertension: Secondary | ICD-10-CM

## 2021-07-01 LAB — CBC
HCT: 32.1 % — ABNORMAL LOW (ref 39.0–52.0)
Hemoglobin: 10.5 g/dL — ABNORMAL LOW (ref 13.0–17.0)
MCHC: 32.7 g/dL (ref 30.0–36.0)
MCV: 94.6 fl (ref 78.0–100.0)
Platelets: 251 10*3/uL (ref 150.0–400.0)
RBC: 3.4 Mil/uL — ABNORMAL LOW (ref 4.22–5.81)
RDW: 14.3 % (ref 11.5–15.5)
WBC: 6.1 10*3/uL (ref 4.0–10.5)

## 2021-07-01 LAB — LIPID PANEL
Cholesterol: 113 mg/dL (ref 0–200)
HDL: 37.4 mg/dL — ABNORMAL LOW (ref >39.00)
LDL Cholesterol: 54 mg/dL (ref 0–99)
NonHDL: 75.83
Total CHOL/HDL Ratio: 3
Triglycerides: 108 mg/dL (ref 0.0–149.0)
VLDL: 21.6 mg/dL (ref 0.0–40.0)

## 2021-07-01 LAB — BASIC METABOLIC PANEL
BUN: 32 mg/dL — ABNORMAL HIGH (ref 6–23)
CO2: 20 mEq/L (ref 19–32)
Calcium: 9.4 mg/dL (ref 8.4–10.5)
Chloride: 108 mEq/L (ref 96–112)
Creatinine, Ser: 1.37 mg/dL (ref 0.40–1.50)
GFR: 48.15 mL/min — ABNORMAL LOW (ref >60.00)
Glucose, Bld: 121 mg/dL — ABNORMAL HIGH (ref 70–99)
Potassium: 5.2 mEq/L — ABNORMAL HIGH (ref 3.5–5.1)
Sodium: 138 mEq/L (ref 135–145)

## 2021-07-01 LAB — VITAMIN B12: Vitamin B-12: 410 pg/mL (ref 211–911)

## 2021-07-01 LAB — MAGNESIUM: Magnesium: 1.3 mg/dL — ABNORMAL LOW (ref 1.5–2.5)

## 2021-07-01 LAB — HEMOGLOBIN A1C: Hgb A1c MFr Bld: 7 % — ABNORMAL HIGH (ref 4.6–6.5)

## 2021-07-01 LAB — GLUCOSE, RANDOM: Glucose, Bld: 121 mg/dL — ABNORMAL HIGH (ref 70–99)

## 2021-07-01 LAB — VITAMIN D 25 HYDROXY (VIT D DEFICIENCY, FRACTURES): VITD: 53.74 ng/mL (ref 30.00–100.00)

## 2021-07-01 NOTE — Progress Notes (Signed)
[facility] [address] [place] [address] Dept: [phone] Dept Fax: [phone]  Transfer of Care Office Visit  Subjective:    Patient ID: Nathan Gomez, male    DOB: 1940/12/13, 80 y.o..   MRN: [id number]  Chief Complaint  Patient presents with   Transitions Of Care    3 mo f/u DM. Bs ranges 118-135    History of Present Illness:  Patient is in today to establish care. Mr. Schedler is originally from [place], [place]. At age 49, his family moved to [place], [place]. He has lived multiple places in his lifetime. He served for 4 years in the [place] [employer] in the [place] War era. He eventually settled in [place] 40 years ago, having taken a job with [employer]. 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 Mr. Wolman had a priro history of tobacco use, but quit in 1988. He drinks rarely. He denies drug use.  Mr. Bolle has a history of prostate cancer, diagnosed earlier this year. He completed 2 months of radiation treatments on July 19th. He has noted some mild urinary urgency, but this is improving with time. He did also have issues with pain, which he relates to gas due to his pancreatic insufficiency. He continues to take Creon and is trying to learn to adjust his dosage based on what he is eating.  Mr. Ciolek has a history of Type 2 diabetes (managed with metformin), hypertension (managed with lisinopril and amlodipine), and hyperlipidemia (managed with atorvastatin). He has Stage 3 chronic kidney disease. He does have issues with some lower leg edema and had previously been on HCTZ, but this was stopped due to concerns for his kidneys.  Mr. 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 He has some OA in the left knee and gets periodic gel injections.  Mr. Fana has a history of Vitamin B12 deficiency with prior anemia. He also has had issue  with Vitamin D deficiency and hypomagnesemia.  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/25/2021   Pancreatic insufficiency 03/31/2021   Irritable bowel syndrome with diarrhea 12/30/2020   Malignant neoplasm of prostate ([place]) 12/30/2020   Stage 3b chronic kidney disease ([place]) 05/01/2020   GERD (gastroesophageal reflux disease) 09/12/2017   Vitamin D deficiency 09/12/2017   Ruptured, tendon, Achilles, right, sequela 04/08/2017   Hypomagnesemia 04/08/2016   B12 deficiency 01/30/2016   Urinary retention 07/30/2015   Personal history of colonic adenomas 01/16/2013   Pernicious anemia 02/22/2012   Diabetes mellitus type II, controlled ([place]) 01/05/2011   Hyperlipidemia 01/05/2011   Essential hypertension 01/05/2011   Primary osteoarthritis of left knee 01/05/2011   Past Surgical History:  Procedure Laterality Date   CATARACT EXTRACTION  08/2012 L, 09/2012 R   COLONOSCOPY     PROSTATE BIOPSY     TONSILLECTOMY  1945   TOTAL HIP ARTHROPLASTY Left 2011   TOTAL HIP ARTHROPLASTY Right 07/09/2015   Procedure: TOTAL HIP ARTHROPLASTY ANTERIOR APPROACH;  Surgeon: Melrose Nakayama, MD;  Location: [place];  Service: Orthopedics;  Laterality: Right;   TOTAL KNEE ARTHROPLASTY Right 07/21/2016   Procedure: RIGHT TOTAL KNEE ARTHROPLASTY;  Surgeon: Melrose Nakayama, MD;  Location: [place];  Service:  Orthopedics;  Laterality: Right;   Family History  Problem Relation Age of Onset   Ovarian cancer Mother    Heart disease Father    Hypertension Father    Ovarian cancer Sister    Heart disease Paternal Uncle    Diabetes Maternal Grandfather    Pneumonia Paternal Grandfather    Brain cancer Cousin    Colon cancer Neg Hx    Esophageal cancer Neg Hx    Liver cancer Neg Hx    Pancreatic cancer Neg  Hx    Rectal cancer Neg Hx    Stomach cancer Neg Hx    Outpatient Medications Prior to Visit  Medication Sig Dispense Refill   amLODipine (NORVASC) 10 MG tablet TAKE 1 TABLET DAILY 90 tablet 3   atorvastatin (LIPITOR) 40 MG tablet Take 1 tablet (40 mg total) by mouth daily. 90 tablet 3   blood glucose meter kit and supplies KIT Use to test blood sugar up to twice a day. DX E11.09 1 each 0   Cholecalciferol (VITAMIN D) 2000 units CAPS Take 1 capsule (2,000 Units total) by mouth every morning. 90 capsule 3   Continuous Blood Gluc Receiver (FREESTYLE LIBRE 14 DAY READER) DEVI 1 Device by Does not apply route as directed. 1 each 2   Continuous Blood Gluc Sensor (FREESTYLE LIBRE 14 DAY SENSOR) MISC 1 Device by Does not apply route every 14 (fourteen) days. 5 each 5   Cyanocobalamin (VITAMIN B12 PO) Take 1 tablet by mouth daily.     glucose blood test strip Use as instructed to test blood sugar up to twice a day. DX:  E11.09 200 each 3   hydrocortisone (ANUSOL-HC) 25 MG suppository Place 1 suppository (25 mg total) rectally 2 (two) times daily. 12 suppository 0   Krill Oil 300 MG CAPS Take 500 mg by mouth daily.      Lancets 88T MISC Delica Fine Lancets. Use to test blood sugar up to twice a day. DX E11.09 200 each 3   lipase/protease/amylase (CREON) 36000 UNITS CPEP capsule Take by mouth.     lisinopril (ZESTRIL) 20 MG tablet TAKE 1 TABLET DAILY 90 tablet 3   Magnesium 100 MG CAPS Take 100 mg by mouth 2 (two) times daily.     metFORMIN (GLUCOPHAGE-XR) 500 MG 24 hr tablet TAKE 2 TABLETS 2 TIMES     DAILY WITH MEALS 360 tablet 1   Multiple Vitamins-Minerals (SENIOR MULTIVITAMIN PLUS) TABS Take 1 tablet by mouth daily.     tamsulosin (FLOMAX) 0.4 MG CAPS capsule Take 1 capsule (0.4 mg total) by mouth at bedtime. (Patient taking differently: Take 0.4-0.8 mg by mouth See admin instructions. Take 0.4 mg by mouth daily. Starting 7 days before procedure take 0.76m by mouth daily) 14 capsule 0    Triamcinolone Acetonide (NASACORT AQ NA) Place 1 spray into the nose daily as needed (allergies).     0.9 %  sodium chloride infusion      No facility-administered medications prior to visit.   Allergies  Allergen Reactions   Nsaids     Kidney Disease   Sulfa Antibiotics Other (See Comments)    As child    Not sure rxn   Thiazide-Type Diuretics Other (See Comments)    Unknown    Objective:   Today's Vitals   07/01/21 0839  BP: 132/60  Pulse: 69  Temp: (!) 97.5 F (36.4 C)  TempSrc: Temporal  SpO2: 98%  Weight: 184 lb 6.4 oz (83.6 kg)  Height: 5'  11" (1.803 m)   Body mass index is 25.72 kg/m.   General: Well developed, well nourished. No acute distress. Extremities: 1+ pitting edema noted to both lower legs. Feet: Skin intact. Mild callus around the margins of the left heel. Pulses present. 5.07 mm   monofilament testing normal. Psych: Alert and oriented. Normal mood and affect.  Health Maintenance Due  Topic Date Due   FOOT EXAM  09/27/2019   COVID-19 Vaccine (4 - Booster for Pfizer series) 11/12/2020   INFLUENZA VACCINE  06/16/2021     Assessment & Plan:   1. Controlled type 2 diabetes mellitus with other diabetic kidney complication, without long-term current use of insulin ([place]) Due for reassessment of HbA1c. Continue metformin.  - Glucose, random - Hemoglobin A1c  2. Pure hypercholesterolemia Due for repeat lipids. Continue atorvastatin.  - Lipid panel  3. Essential hypertension Blood pressure control adequate. Amlodipine may be contributing to pedal edema.  4. Stage 3b chronic kidney disease ([place]) We will reassess his current renal function, now s/p radiation. - Basic metabolic panel  5. Hypomagnesemia We will reassess his magnesium level  - Magnesium  6. Pernicious anemia History of anemia. We will recheck his CBC.  - CBC  7. B12 deficiency  - Vitamin B12  8. Vitamin D deficiency  - VITAMIN D 25 Hydroxy (Vit-D Deficiency,  Fractures)   9. Malignant neoplasm of prostate [facility]) Completed radiation therapy. He will continue to follow with urology.   Haydee Salter, MD

## 2021-07-10 DIAGNOSIS — N401 Enlarged prostate with lower urinary tract symptoms: Secondary | ICD-10-CM | POA: Diagnosis not present

## 2021-07-10 DIAGNOSIS — R3914 Feeling of incomplete bladder emptying: Secondary | ICD-10-CM | POA: Diagnosis not present

## 2021-07-10 DIAGNOSIS — C61 Malignant neoplasm of prostate: Secondary | ICD-10-CM | POA: Diagnosis not present

## 2021-07-14 ENCOUNTER — Encounter: Payer: Self-pay | Admitting: Urology

## 2021-07-14 ENCOUNTER — Other Ambulatory Visit: Payer: Self-pay

## 2021-07-14 NOTE — Progress Notes (Signed)
Patient reports nocturia x3, w/ mild frequency/ urgency, and has a good urine stream. Denies pain, fatigue, dysuria, hematuria, diarrhea, constipation, or skin changes. Using Flomax as directed.  I-PSS score of 5 (mild) Meaningful use questions complete. Patient notified of 10:30am telephone visit on 07/16/21 and expressed understanding.

## 2021-07-16 ENCOUNTER — Other Ambulatory Visit: Payer: Self-pay

## 2021-07-16 ENCOUNTER — Ambulatory Visit
Admission: RE | Admit: 2021-07-16 | Discharge: 2021-07-16 | Disposition: A | Discharge status: Home / Self Care | Payer: Medicare Other | Source: Ambulatory Visit | Attending: Urology | Admitting: Urology

## 2021-07-16 DIAGNOSIS — C61 Malignant neoplasm of prostate: Secondary | ICD-10-CM

## 2021-07-16 NOTE — Progress Notes (Signed)
Radiation Oncology         [phone] ________________________________  Name: Nathan Gomez MRN: [id number]  Date: 07/16/2021  DOB: Feb 14, 1941  Post Treatment Note  CC: Haydee Salter, MD  Robley Fries, MD  Diagnosis:   80 y.o. gentleman with Stage T2a adenocarcinoma of the prostate with Gleason score of 3+4, and PSA of 6.36.    Interval Since Last Radiation:  6 weeks  04/24/21 - 06/03/21: The prostate was treated to 70 Gy in 28 fractions of 2.5 Gy  Narrative: I spoke with the patient to conduct his routine scheduled 1 month follow up visit via telephone to spare the patient unnecessary potential exposure in the healthcare setting during the current COVID-19 pandemic.  The patient was notified in advance and gave permission to proceed with this visit format.    He tolerated radiation treatment relatively well.   The patient experienced some minor urinary irritation and modest fatigue. He did experience dysuria, increased nocturia, urgency, frequency, hesitancy and intermittency which did improve somewhat on increased dose of Flomax twice daily.  He also experienced diarrhea and rectal pressure/burning.                              On review of systems, the patient states he is doing well in general.  He reports complete resolution of the dysuria as well as diarrhea and rectal pressure/burning.  He feels that his LUTS are gradually improving as well but he does continue with residual nocturia x3 as well as mild frequency and urgency but has a strong stream and feels that he is emptying his bladder well on voiding.  He is continue taking Flomax twice daily as prescribed.  He denies abdominal pain, nausea, vomiting, diarrhea or constipation.  He reports a healthy appetite and is maintaining his weight.  He has noticed a gradual improvement in his stamina and, overall, he is pleased with his progress to date.  He has an upcoming trip planned which he is excited about.  ALLERGIES:  is  allergic to nsaids, sulfa antibiotics, and thiazide-type diuretics.  Meds: Current Outpatient Medications  Medication Sig Dispense Refill   amLODipine (NORVASC) 10 MG tablet TAKE 1 TABLET DAILY 90 tablet 3   atorvastatin (LIPITOR) 40 MG tablet Take 1 tablet (40 mg total) by mouth daily. 90 tablet 3   blood glucose meter kit and supplies KIT Use to test blood sugar up to twice a day. DX E11.09 1 each 0   Cholecalciferol (VITAMIN D) 2000 units CAPS Take 1 capsule (2,000 Units total) by mouth every morning. 90 capsule 3   Continuous Blood Gluc Receiver (FREESTYLE LIBRE 14 DAY READER) DEVI 1 Device by Does not apply route as directed. 1 each 2   Continuous Blood Gluc Sensor (FREESTYLE LIBRE 14 DAY SENSOR) MISC 1 Device by Does not apply route every 14 (fourteen) days. 5 each 5   Cyanocobalamin (VITAMIN B12 PO) Take 1 tablet by mouth daily.     glucose blood test strip Use as instructed to test blood sugar up to twice a day. DX:  E11.09 200 each 3   Krill Oil 300 MG CAPS Take 500 mg by mouth daily.      Lancets 15Q MISC Delica Fine Lancets. Use to test blood sugar up to twice a day. DX E11.09 200 each 3   lipase/protease/amylase (CREON) 36000 UNITS CPEP capsule Take by mouth.     lisinopril (ZESTRIL)  20 MG tablet TAKE 1 TABLET DAILY 90 tablet 3   Magnesium 100 MG CAPS Take 100 mg by mouth 2 (two) times daily.     metFORMIN (GLUCOPHAGE-XR) 500 MG 24 hr tablet TAKE 2 TABLETS 2 TIMES     DAILY WITH MEALS 360 tablet 1   Multiple Vitamins-Minerals (SENIOR MULTIVITAMIN PLUS) TABS Take 1 tablet by mouth daily.     tamsulosin (FLOMAX) 0.4 MG CAPS capsule Take 1 capsule (0.4 mg total) by mouth at bedtime. (Patient taking differently: Take 0.4-0.8 mg by mouth See admin instructions. Take 0.4 mg by mouth daily. Starting 7 days before procedure take 0.17m by mouth daily) 14 capsule 0   Triamcinolone Acetonide (NASACORT AQ NA) Place 1 spray into the nose daily as needed (allergies).     hydrocortisone  (ANUSOL-HC) 25 MG suppository Place 1 suppository (25 mg total) rectally 2 (two) times daily. (Patient not taking: Reported on 07/14/2021) 12 suppository 0   No current facility-administered medications for this encounter.    Physical Findings:  vitals were not taken for this visit.  Pain Assessment Pain Score: 0-No pain/10 Unable to assess due to telephone follow-up visit format.  Lab Findings: Lab Results  Component Value Date   WBC 6.1 07/01/2021   HGB 10.5 (L) 07/01/2021   HCT 32.1 (L) 07/01/2021   MCV 94.6 07/01/2021   PLT 251.0 07/01/2021     Radiographic Findings: No results found.  Impression/Plan: 1. 80y.o. gentleman with Stage T2a adenocarcinoma of the prostate with Gleason score of 3+4, and PSA of 6.36.   He will continue to follow up with urology for ongoing PSA determinations and had a recent follow-up appointment with Dr. PClaudia Desanctislast week.  He is scheduled for labs to check his first posttreatment PSA on 08/29/2021 and will see Dr. PClaudia Desanctisagain the following week to review these results.  He understands what to expect with regards to PSA monitoring going forward. I will look forward to following his response to treatment via correspondence with urology, and would be happy to continue to participate in his care if clinically indicated. I talked to the patient about what to expect in the future, including his risk for erectile dysfunction and rectal bleeding. I encouraged him to call or return to the office if he has any questions regarding his previous radiation or possible radiation side effects. He was comfortable with this plan and will follow up as needed.     ANicholos Johns PA-C

## 2021-07-16 NOTE — Progress Notes (Signed)
  Radiation Oncology         (336) [phone] ________________________________  Name: Nathan Gomez MRN: [id number]  Date: 06/03/2021  DOB: 1941/01/11  End of Treatment Note  Diagnosis:    80 y.o. gentleman with Stage T2a adenocarcinoma of the prostate with Gleason score of 3+4, and PSA of 6.36.     Indication for treatment:  Curative, Definitive Radiotherapy       Radiation treatment dates:   04/24/21 - 06/03/21  Site/dose:   The prostate was treated to 70 Gy in 28 fractions of 2.5 Gy  Beams/energy:   The patient was treated with IMRT using volumetric arc therapy delivering 6 MV X-rays to clockwise and counterclockwise circumferential arcs with a 90 degree collimator offset to avoid dose scalloping.  Image guidance was performed with daily cone beam CT prior to each fraction to align to gold markers in the prostate and assure proper bladder and rectal fill volumes.  Immobilization was achieved with BodyFix custom mold.  Narrative: The patient tolerated radiation treatment relatively well.   The patient experienced some minor urinary irritation and modest fatigue.  He did experience dysuria, increased nocturia, urgency, frequency, hesitancy and intermittency which did improve somewhat on increased dose of Flomax twice daily.  He also experienced diarrhea and rectal pressure/burning.  Plan: The patient has completed radiation treatment. He will return to radiation oncology clinic for routine followup in one month. I advised him to call or return sooner if he has any questions or concerns related to his recovery or treatment. ________________________________  Sheral Apley. Tammi Klippel, M.D.

## 2021-07-27 ENCOUNTER — Encounter: Payer: Self-pay | Admitting: Family Medicine

## 2021-07-27 DIAGNOSIS — U071 COVID-19: Secondary | ICD-10-CM | POA: Diagnosis not present

## 2021-07-31 ENCOUNTER — Other Ambulatory Visit: Payer: Self-pay | Admitting: Urology

## 2021-07-31 DIAGNOSIS — C61 Malignant neoplasm of prostate: Secondary | ICD-10-CM

## 2021-08-04 ENCOUNTER — Other Ambulatory Visit: Payer: Self-pay | Admitting: Family Medicine

## 2021-08-04 DIAGNOSIS — E1169 Type 2 diabetes mellitus with other specified complication: Secondary | ICD-10-CM

## 2021-08-04 DIAGNOSIS — E1129 Type 2 diabetes mellitus with other diabetic kidney complication: Secondary | ICD-10-CM

## 2021-08-04 DIAGNOSIS — N1832 Chronic kidney disease, stage 3b: Secondary | ICD-10-CM

## 2021-08-08 DIAGNOSIS — J029 Acute pharyngitis, unspecified: Secondary | ICD-10-CM | POA: Diagnosis not present

## 2021-08-08 DIAGNOSIS — R0981 Nasal congestion: Secondary | ICD-10-CM | POA: Diagnosis not present

## 2021-08-08 DIAGNOSIS — R059 Cough, unspecified: Secondary | ICD-10-CM | POA: Diagnosis not present

## 2021-08-08 DIAGNOSIS — J3489 Other specified disorders of nose and nasal sinuses: Secondary | ICD-10-CM | POA: Diagnosis not present

## 2021-08-08 DIAGNOSIS — R0982 Postnasal drip: Secondary | ICD-10-CM | POA: Diagnosis not present

## 2021-08-08 DIAGNOSIS — H938X3 Other specified disorders of ear, bilateral: Secondary | ICD-10-CM | POA: Diagnosis not present

## 2021-08-08 DIAGNOSIS — R509 Fever, unspecified: Secondary | ICD-10-CM | POA: Diagnosis not present

## 2021-08-13 ENCOUNTER — Other Ambulatory Visit: Payer: Self-pay

## 2021-08-13 ENCOUNTER — Encounter (HOSPITAL_COMMUNITY): Payer: Self-pay

## 2021-08-13 ENCOUNTER — Ambulatory Visit (HOSPITAL_COMMUNITY)
Admission: EM | Admit: 2021-08-13 | Discharge: 2021-08-13 | Disposition: A | Discharge status: Home / Self Care | Payer: Medicare Other | Attending: Family Medicine | Admitting: Family Medicine

## 2021-08-13 DIAGNOSIS — R0989 Other specified symptoms and signs involving the circulatory and respiratory systems: Secondary | ICD-10-CM | POA: Diagnosis not present

## 2021-08-13 DIAGNOSIS — R059 Cough, unspecified: Secondary | ICD-10-CM | POA: Diagnosis not present

## 2021-08-13 MED ORDER — PROMETHAZINE-DM 6.25-15 MG/5ML PO SYRP: 5.0000 mL | ORAL_SOLUTION | Freq: Four times a day (QID) | ORAL | 0 refills | Status: DC | PRN

## 2021-08-13 MED ORDER — BUDESONIDE-FORMOTEROL FUMARATE 160-4.5 MCG/ACT IN AERO: 2.0000 | INHALATION_SPRAY | Freq: Two times a day (BID) | RESPIRATORY_TRACT | 0 refills | Status: DC

## 2021-08-13 NOTE — ED Provider Notes (Signed)
[facility]    CSN: [id number] Arrival date & time: 08/13/21  1151      History   Chief Complaint Chief Complaint  Patient presents with   Cough    HPI Nathan Gomez is a 80 y.o. male.   Patient presenting today following up on about 2 weeks of cough, congestion, chest congestion, fatigue.  Has had some intermittent fevers here and there as well.  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  States another provider put him on amoxicillin about a week ago because his symptoms had not fully resolved but this did not seem to help anything.  Also taking Mucinex at home with minimal relief.  No known history of asthma, does have a history of sinus issues and takes Claritin for this.  Past Medical History:  Diagnosis Date   Achilles tendinitis    Allergy    Cataract    Chronic kidney disease    Complication of anesthesia    urinary retention   Degenerative tear of left medial meniscus 11/2014   Diabetes mellitus, type 2 (HCC)    Dyslipidemia    GERD (gastroesophageal reflux disease)    Heart murmur    child   Hyperlipidemia    Hypertension    Osteoarthritis    right knee   Personal history of colonic adenomas 01/16/2013   Plantar fasciitis    Prostate cancer [facility])     Patient Active Problem List   Diagnosis Date Noted   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/25/2021   Lumbar spondylosis 04/25/2021   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/25/2021   Pancreatic insufficiency 03/31/2021   Irritable bowel syndrome with diarrhea 12/30/2020   Malignant neoplasm of prostate ([place]) 12/30/2020   Stage 3b chronic kidney disease ([place]) 05/01/2020   GERD (gastroesophageal reflux disease) 09/12/2017   Vitamin D deficiency 09/12/2017   Ruptured, tendon, Achilles, right, sequela 04/08/2017   Hypomagnesemia 04/08/2016   B12 deficiency  01/30/2016   Urinary retention 07/30/2015   Personal history of colonic adenomas 01/16/2013   Pernicious anemia 02/22/2012   Diabetes mellitus type II, controlled ([place]) 01/05/2011   Hyperlipidemia 01/05/2011   Essential hypertension 01/05/2011   Primary osteoarthritis of left knee 01/05/2011    Past Surgical History:  Procedure Laterality Date   CATARACT EXTRACTION  08/2012 L, 09/2012 R   COLONOSCOPY     PROSTATE BIOPSY     TONSILLECTOMY  1945   TOTAL HIP ARTHROPLASTY Left 2011   TOTAL HIP ARTHROPLASTY Right 07/09/2015   Procedure: TOTAL HIP ARTHROPLASTY ANTERIOR APPROACH;  Surgeon: Melrose Nakayama, MD;  Location: [facility];  Service: Orthopedics;  Laterality: Right;   TOTAL KNEE ARTHROPLASTY Right 07/21/2016   Procedure: RIGHT TOTAL KNEE ARTHROPLASTY;  Surgeon: Melrose Nakayama, MD;  Location: [facility];  Service: Orthopedics;  Laterality: Right;       Home Medications    Prior to Admission medications   Medication Sig Start Date End Date Taking? Authorizing Provider  budesonide-formoterol (SYMBICORT) 160-4.5 MCG/ACT inhaler Inhale 2 puffs into the lungs 2 (two) times daily. Rinse mouth with water after each use 08/13/21  Yes Volney American, PA-C  promethazine-dextromethorphan (PROMETHAZINE-DM) 6.25-15 MG/5ML syrup Take 5 mLs by mouth 4 (four) times daily as needed for cough. 08/13/21  Yes Volney American, PA-C  amLODipine (NORVASC) 10 MG tablet TAKE 1 TABLET DAILY 04/15/21  Libby Maw, MD  atorvastatin (LIPITOR) 40 MG tablet Take 1 tablet (40 mg total) by mouth daily. 06/02/21   Haydee Salter, MD  blood glucose meter kit and supplies KIT Use to test blood sugar up to twice a day. DX E11.09 06/20/20   Libby Maw, MD  Cholecalciferol (VITAMIN D) 2000 units CAPS Take 1 capsule (2,000 Units total) by mouth every morning. 12/03/17   Opalski, Neoma Laming, DO  Continuous Blood Gluc Receiver (FREESTYLE LIBRE 14 DAY READER) DEVI 1 Device by Does not apply route as  directed. 05/28/20   Libby Maw, MD  Continuous Blood Gluc Sensor (FREESTYLE LIBRE 14 DAY SENSOR) MISC 1 Device by Does not apply route every 14 (fourteen) days. 05/28/20   Libby Maw, MD  Cyanocobalamin (VITAMIN B12 PO) Take 1 tablet by mouth daily.    [provider]  glucose blood test strip Use as instructed to test blood sugar up to twice a day. DX:  E11.09 07/30/15   Rowe Clack, MD  hydrocortisone (ANUSOL-HC) 25 MG suppository Place 1 suppository (25 mg total) rectally 2 (two) times daily. Patient not taking: Reported on 07/14/2021 06/02/21   Hayden Pedro, PA-C  Krill Oil 300 MG CAPS Take 500 mg by mouth daily.     [provider]  Lancets 76P MISC Delica Fine Lancets. Use to test blood sugar up to twice a day. DX E11.09 07/30/15   Rowe Clack, MD  lipase/protease/amylase (CREON) 36000 UNITS CPEP capsule Take by mouth.    [provider]  lisinopril (ZESTRIL) 20 MG tablet TAKE 1 TABLET DAILY 04/15/21   Libby Maw, MD  Magnesium 100 MG CAPS Take 100 mg by mouth 2 (two) times daily.    [provider]  metFORMIN (GLUCOPHAGE-XR) 500 MG 24 hr tablet TAKE 2 TABLETS 2 TIMES     DAILY WITH MEALS 06/26/21   Haydee Salter, MD  Multiple Vitamins-Minerals (SENIOR MULTIVITAMIN PLUS) TABS Take 1 tablet by mouth daily.    [provider]  tamsulosin (FLOMAX) 0.4 MG CAPS capsule Take 1 capsule (0.4 mg total) by mouth at bedtime. Patient taking differently: Take 0.4-0.8 mg by mouth See admin instructions. Take 0.4 mg by mouth daily. Starting 7 days before procedure take 0.94m by mouth daily 03/27/16   BBinnie Rail MD  Triamcinolone Acetonide (NASACORT AQ NA) Place 1 spray into the nose daily as needed (allergies).    [provider]    Family History Family History  Problem Relation Age of Onset   Ovarian cancer Mother    Heart disease Father    Hypertension Father    Ovarian cancer Sister     Heart disease Paternal Uncle    Diabetes Maternal Grandfather    Pneumonia Paternal Grandfather    Brain cancer Cousin    Colon cancer Neg Hx    Esophageal cancer Neg Hx    Liver cancer Neg Hx    Pancreatic cancer Neg Hx    Rectal cancer Neg Hx    Stomach cancer Neg Hx     Social History Social History   Tobacco Use   Smoking status: Former    Packs/day: 1.00    Years: 20.00    Pack years: 20.00    Types: Cigarettes    Quit date: 11/16/1986    Years since quitting: 34.7   Smokeless tobacco: Never  Vaping Use   Vaping Use: Never used  Substance Use Topics   Alcohol use:  Yes    Alcohol/week: 1.0 - 2.0 standard drink    Types: 1 - 2 Glasses of wine per week    Comment: occ   Drug use: No     Allergies   Nsaids, Sulfa antibiotics, and Thiazide-type diuretics   Review of Systems Review of Systems Per HPI  Physical Exam Triage Vital Signs ED Triage Vitals  Enc Vitals Group     BP 08/13/21 1308 (!) 153/80     Pulse Rate 08/13/21 1308 77     Resp 08/13/21 1308 18     Temp 08/13/21 1308 98.3 F (36.8 C)     Temp Source 08/13/21 1308 Oral     SpO2 08/13/21 1308 97 %     Weight --      Height --      Head Circumference --      Peak Flow --      Pain Score 08/13/21 1309 0     Pain Loc --      Pain Edu? --      Excl. in [place]? --    No data found.  Updated Vital Signs BP (!) 153/80 (BP Location: Left Arm)   Pulse 77   Temp 98.3 F (36.8 C) (Oral)   Resp 18   SpO2 97%   Visual Acuity Right Eye Distance:   Left Eye Distance:   Bilateral Distance:    Right Eye Near:   Left Eye Near:    Bilateral Near:     Physical Exam Vitals and nursing note reviewed.  Constitutional:      Appearance: Normal appearance.  HENT:     Head: Atraumatic.     Right Ear: Tympanic membrane normal.     Left Ear: Tympanic membrane normal.     Nose: Rhinorrhea present.     Mouth/Throat:     Mouth: Mucous membranes are moist.     Pharynx: Posterior oropharyngeal erythema  present.  Eyes:     Extraocular Movements: Extraocular movements intact.     Conjunctiva/sclera: Conjunctivae normal.  Cardiovascular:     Rate and Rhythm: Normal rate and regular rhythm.     Heart sounds: Normal heart sounds.  Pulmonary:     Effort: Pulmonary effort is normal.     Breath sounds: Normal breath sounds. No wheezing or rales.  Musculoskeletal:        General: Normal range of motion.     Cervical back: Normal range of motion and neck supple.  Skin:    General: Skin is warm and dry.  Neurological:     General: No focal deficit present.     Mental Status: He is oriented to person, place, and time.  Psychiatric:        Mood and Affect: Mood normal.        Thought Content: Thought content normal.        Judgment: Judgment normal.     UC Treatments / Results  Labs (all labs ordered are listed, but only abnormal results are displayed) Labs Reviewed - No data to display  EKG   Radiology No results found.  Procedures Procedures (including critical care time)  Medications Ordered in UC Medications - No data to display  Initial Impression / Assessment and Plan / UC Course  I have reviewed the triage vital signs and the nursing notes.  Pertinent labs & imaging results that were available during my care of the patient were reviewed by me and considered in my medical decision making (see  chart for details).     Very mildly hypertensive in triage but otherwise vital signs benign and within normal limits.  Exam reassuring without significant abnormal findings.  Suspect some lingering post viral cough, inflammation and possibly some poorly controlled seasonal allergies despite Claritin daily.  Could also be long COVID symptoms.  Discussed Symbicort, Phenergan DM, continued Mucinex.  Follow-up with PCP for recheck symptoms and return for worsening symptoms at any time.  Final Clinical Impressions(s) / UC Diagnoses   Final diagnoses:  Cough  Chest congestion    Discharge Instructions   None    ED Prescriptions     Medication Sig Dispense Auth. Provider   budesonide-formoterol (SYMBICORT) 160-4.5 MCG/ACT inhaler Inhale 2 puffs into the lungs 2 (two) times daily. Rinse mouth with water after each use 1 each Volney American, PA-C   promethazine-dextromethorphan (PROMETHAZINE-DM) 6.25-15 MG/5ML syrup Take 5 mLs by mouth 4 (four) times daily as needed for cough. 100 mL Volney American, [place]      PDMP not reviewed this encounter.   Volney American, [place] 08/13/21 1418

## 2021-08-13 NOTE — ED Triage Notes (Signed)
Pt states had COVID 9/11. States received paxovid 150mg  . States still having a cough and chest congestion. States has one more day on his amoxicillin. States chest congestion is worse at night.

## 2021-08-14 DIAGNOSIS — E119 Type 2 diabetes mellitus without complications: Secondary | ICD-10-CM | POA: Diagnosis not present

## 2021-08-14 DIAGNOSIS — H40053 Ocular hypertension, bilateral: Secondary | ICD-10-CM | POA: Diagnosis not present

## 2021-08-14 DIAGNOSIS — H26493 Other secondary cataract, bilateral: Secondary | ICD-10-CM | POA: Diagnosis not present

## 2021-08-14 LAB — HM DIABETES EYE EXAM

## 2021-08-15 ENCOUNTER — Encounter: Payer: Self-pay | Admitting: Family Medicine

## 2021-08-18 ENCOUNTER — Telehealth: Payer: Self-pay | Admitting: Adult Health

## 2021-08-18 NOTE — Telephone Encounter (Signed)
Called and reviewed [facility] referral for patient.  He would like to receive an in person visit, and would prefer to come after his urology appt on 10/14.  I sent a message to work to coordinate this.    Wilber Bihari, NP

## 2021-08-22 ENCOUNTER — Other Ambulatory Visit: Payer: Self-pay | Admitting: Physician Assistant

## 2021-08-29 ENCOUNTER — Encounter: Payer: Self-pay | Admitting: Physician Assistant

## 2021-08-29 ENCOUNTER — Other Ambulatory Visit: Payer: Self-pay

## 2021-08-29 ENCOUNTER — Inpatient Hospital Stay: Payer: Medicare Other | Attending: Physician Assistant | Admitting: Physician Assistant

## 2021-08-29 VITALS — BP 137/63 | HR 85 | Temp 98.2°F | Resp 18 | Wt 186.0 lb

## 2021-08-29 DIAGNOSIS — C61 Malignant neoplasm of prostate: Secondary | ICD-10-CM | POA: Diagnosis not present

## 2021-08-29 DIAGNOSIS — N189 Chronic kidney disease, unspecified: Secondary | ICD-10-CM | POA: Insufficient documentation

## 2021-08-29 DIAGNOSIS — Z808 Family history of malignant neoplasm of other organs or systems: Secondary | ICD-10-CM | POA: Diagnosis not present

## 2021-08-29 DIAGNOSIS — Z87891 Personal history of nicotine dependence: Secondary | ICD-10-CM | POA: Insufficient documentation

## 2021-08-29 DIAGNOSIS — I129 Hypertensive chronic kidney disease with stage 1 through stage 4 chronic kidney disease, or unspecified chronic kidney disease: Secondary | ICD-10-CM | POA: Diagnosis not present

## 2021-08-29 DIAGNOSIS — E1122 Type 2 diabetes mellitus with diabetic chronic kidney disease: Secondary | ICD-10-CM | POA: Insufficient documentation

## 2021-08-29 DIAGNOSIS — Z8041 Family history of malignant neoplasm of ovary: Secondary | ICD-10-CM | POA: Insufficient documentation

## 2021-08-29 NOTE — Progress Notes (Signed)
[place] [phone]   [phone] [place]  Patient Care Team: Haydee Salter, MD as PCP - General (Family Medicine) Darleen Crocker, MD (Ophthalmology) Melrose Nakayama, MD (Orthopedic Surgery) Katy Apo, MD as Consulting Physician (Ophthalmology) Lavonna Monarch, MD as Consulting Physician (Dermatology) Robley Fries, MD as Consulting Physician (Urology) Ronald Lobo, MD as Consulting Physician (Gastroenterology)  Oncology History  Malignant neoplasm of prostate [facility])  12/30/2020 Initial Diagnosis   Malignant neoplasm of prostate ([place])   01/24/2021 Cancer Staging   Staging form: Prostate, AJCC 8th Edition - Clinical stage from 01/24/2021: Stage IIB (cT2a, cN0, cM0, PSA: 6.4, Grade Group: 2) - Signed by Freeman Caldron, PA-C on 03/18/2021 Histopathologic type: Adenocarcinoma, NOS Stage prefix: Initial diagnosis Prostate specific antigen (PSA) range: Less than 10 Gleason primary pattern: 2 Gleason secondary pattern: 4 Gleason score: 7 Histologic grading system: 5 grade system Number of biopsy cores examined: 23 Number of biopsy cores positive: 9 Location of positive needle core biopsies: One side    ONCOLOGIC TREATMENT: 04/24/21 - 06/03/21: Intensity Modulated Radiotherapy (IMRT) to the prostate, 70 Gy in 28 fractions of 2.5 Gy  HISTORY OF PRESENTING ILLNESS:  Nathan Gomez 80 y.o. male presents today to review survivorship care plan detailing his treatment course for prostate cancer, monitoring long-term side effects of the treatment, education regarding health maintenance, cancer screening and overall wellness and health promotion.  On exam today, Mr. Nathan Gomez reports that his energy levels and appetite are fairly stable. He is able to complete all his daily activities independently. He continues to recover from COVID  infection approximately five weeks ago. He received Paxlovid x 5 days followed by antibiotic therapy. He  currently uses Symbicort inhaler twice daily. He still has upper respiratory congestion and intermittent episodes of a cough. Fortunately, he denies any shortness of breath. He denies nausea, vomiting or abdominal pain. He continues to take Creon with his meals to help with gas and absorption secondary to pancreatic insufficiency. He denies any diarrhea or constipation. He notes that his urinary symptoms have markedly improved since completion radiation. He denies any hematuria or dysuria at this time. He has no other signs of bleeding. Patient denies any fevers, chills, night sweats, shortness of breath or chest pain. He has no there complaints. Rest of the ROS is below.   MEDICAL HISTORY:  Past Medical History:  Diagnosis Date   Achilles tendinitis    Allergy    Cataract    Chronic kidney disease    Complication of anesthesia    urinary retention   Degenerative tear of left medial meniscus 11/2014   Diabetes mellitus, type 2 (HCC)    Dyslipidemia    GERD (gastroesophageal reflux disease)    Heart murmur    child   Hyperlipidemia    Hypertension    Osteoarthritis    right knee   Personal history of colonic adenomas 01/16/2013   Plantar fasciitis    Prostate cancer [facility])     SURGICAL HISTORY: Past Surgical History:  Procedure Laterality Date   CATARACT EXTRACTION  08/2012 L, 09/2012 R   COLONOSCOPY     PROSTATE BIOPSY     TONSILLECTOMY  1945   TOTAL HIP ARTHROPLASTY Left 2011   TOTAL HIP ARTHROPLASTY Right 07/09/2015   Procedure: TOTAL HIP ARTHROPLASTY ANTERIOR APPROACH;  Surgeon: Melrose Nakayama, MD;  Location: [place];  Service: Orthopedics;  Laterality: Right;   TOTAL KNEE ARTHROPLASTY Right 07/21/2016   Procedure: RIGHT TOTAL KNEE ARTHROPLASTY;  Surgeon: Melrose Nakayama, MD;  Location: [place];  Service: Orthopedics;  Laterality: Right;    SOCIAL HISTORY: Social History   Socioeconomic History   Marital status: Married    Spouse name: Not on file   Number of children: 3    Years of education: Not on file   Highest education level: Not on file  Occupational History   Occupation: Retired    Fish farm manager: [employer]    Comment: Building services engineer  Tobacco Use   Smoking status: Former    Packs/day: 1.00    Years: 20.00    Pack years: 20.00    Types: Cigarettes    Quit date: 11/16/1986    Years since quitting: 34.8   Smokeless tobacco: Never  Vaping Use   Vaping Use: Never used  Substance and Sexual Activity   Alcohol use: Yes    Alcohol/week: 1.0 - 2.0 standard drink    Types: 1 - 2 Glasses of wine per week    Comment: occ   Drug use: No   Sexual activity: Yes  Other Topics Concern   Not on file  Social History Narrative   Wife is from [place].   Served 4 years in [employer] ([place] era)   Social Determinants of Health   Financial Resource Strain: Low Risk    Difficulty of Paying Living Expenses: Not hard at all  Food Insecurity: No Food Insecurity   Worried About Charity fundraiser in the Last Year: Never true   Arboriculturist in the Last Year: Never true  Transportation Needs: No Transportation Needs   Lack of Transportation (Medical): No   Lack of Transportation (Non-Medical): No  Physical Activity: Inactive   Days of Exercise per Week: 0 days   Minutes of Exercise per Session: 0 min  Stress: No Stress Concern Present   Feeling of Stress : Not at all  Social Connections: Moderately Integrated   Frequency of Communication with Friends and Family: More than three times a week   Frequency of Social Gatherings with Friends and Family: Twice a week   Attends Religious Services: More than 4 times per year   Active Member of [employer] or Organizations: No   Attends Music therapist: Never   Marital Status: Married  Human resources officer Violence: Not At Risk   Fear of Current or Ex-Partner: No   Emotionally Abused: No   Physically Abused: No   Sexually Abused: No    FAMILY HISTORY: Family History  Problem Relation Age of Onset   Ovarian  cancer Mother    Heart disease Father    Hypertension Father    Ovarian cancer Sister    Heart disease Paternal Uncle    Diabetes Maternal Grandfather    Pneumonia Paternal Grandfather    Brain cancer Cousin    Colon cancer Neg Hx    Esophageal cancer Neg Hx    Liver cancer Neg Hx    Pancreatic cancer Neg Hx    Rectal cancer Neg Hx    Stomach cancer Neg Hx     ALLERGIES:  is allergic to nsaids, sulfa antibiotics, and thiazide-type diuretics.  MEDICATIONS:  Current Outpatient Medications  Medication Sig Dispense Refill   amLODipine (NORVASC) 10 MG tablet TAKE 1 TABLET DAILY 90 tablet 3   atorvastatin (LIPITOR) 40 MG tablet Take 1 tablet (40 mg total) by mouth daily. 90 tablet 3   benzonatate (TESSALON) 100 MG capsule Take 100-200 mg by mouth 3 (three) times daily as needed.  blood glucose meter kit and supplies KIT Use to test blood sugar up to twice a day. DX E11.09 1 each 0   budesonide-formoterol (SYMBICORT) 160-4.5 MCG/ACT inhaler Inhale 2 puffs into the lungs 2 (two) times daily. Rinse mouth with water after each use 1 each 0   Cholecalciferol (VITAMIN D) 2000 units CAPS Take 1 capsule (2,000 Units total) by mouth every morning. 90 capsule 3   Continuous Blood Gluc Receiver (FREESTYLE LIBRE 14 DAY READER) DEVI 1 Device by Does not apply route as directed. 1 each 2   Cyanocobalamin (VITAMIN B12 PO) Take 1 tablet by mouth daily.     fluticasone (FLONASE) 50 MCG/ACT nasal spray Place 1 spray into both nostrils 2 (two) times daily.     glucose blood test strip Use as instructed to test blood sugar up to twice a day. DX:  E11.09 200 each 3   Krill Oil 300 MG CAPS Take 500 mg by mouth daily.      Lancets 33A MISC Delica Fine Lancets. Use to test blood sugar up to twice a day. DX E11.09 200 each 3   lipase/protease/amylase (CREON) 36000 UNITS CPEP capsule Take by mouth.     lisinopril (ZESTRIL) 20 MG tablet TAKE 1 TABLET DAILY 90 tablet 3   Magnesium 100 MG CAPS Take 100 mg by  mouth 2 (two) times daily.     metFORMIN (GLUCOPHAGE-XR) 500 MG 24 hr tablet TAKE 2 TABLETS 2 TIMES     DAILY WITH MEALS 360 tablet 1   Multiple Vitamins-Minerals (SENIOR MULTIVITAMIN PLUS) TABS Take 1 tablet by mouth daily.     tamsulosin (FLOMAX) 0.4 MG CAPS capsule Take 1 capsule (0.4 mg total) by mouth at bedtime. (Patient taking differently: Take 0.4-0.8 mg by mouth See admin instructions. Take 0.4 mg by mouth daily. Starting 7 days before procedure take 0.20m by mouth daily) 14 capsule 0   No current facility-administered medications for this visit.   Social Determinants of Health with Concerns   Tobacco Use: Medium Risk   Smoking Tobacco Use: Former   Smokeless Tobacco Use: Never  Physical Activity: Inactive   Days of Exercise per Week: 0 days   Minutes of Exercise per Session: 0 min    Review of Systems  Constitutional:  Negative for chills, fever, malaise/fatigue and weight loss.  Respiratory:  Negative for shortness of breath and wheezing.   Cardiovascular:  Positive for leg swelling (chronic and mild). Negative for chest pain and palpitations.  Gastrointestinal:  Negative for abdominal pain, blood in stool, constipation, diarrhea, melena, nausea and vomiting.  Genitourinary:  Negative for dysuria and hematuria.  Musculoskeletal:  Negative for myalgias.  Skin:  Negative for rash.    PHYSICAL EXAMINATION: ECOG PERFORMANCE STATUS: 0 - Asymptomatic  Vitals:   08/29/21 1507  BP: 137/63  Pulse: 85  Resp: 18  Temp: 98.2 F (36.8 C)  SpO2: 96%   Filed Weights   08/29/21 1507  Weight: 186 lb (84.4 kg)    GENERAL: well appearing male in NAD  SKIN: skin color, texture, turgor are normal, no rashes or significant lesions EYES: conjunctiva are pink and non-injected, sclera clear OROPHARYNX: no exudate, no erythema; lips, buccal mucosa, and tongue normal  NECK: supple, non-tender LYMPH:  no palpable lymphadenopathy in the cervical, axillary or supraclavicular lymph  nodes.  LUNGS: clear to auscultation and percussion with normal breathing effort HEART: regular rate & rhythm and no murmurs and no lower extremity edema ABDOMEN: soft, non-tender, non-distended, normal bowel sounds  Musculoskeletal: no cyanosis of digits and no clubbing  PSYCH: alert & oriented x 3, fluent speech NEURO: no focal motor/sensory deficits  LABORATORY DATA:  I have reviewed the data as listed CBC Latest Ref Rng & Units 07/01/2021 05/01/2020 07/05/2019  WBC 4.0 - 10.5 K/uL 6.1 5.1 5.0  Hemoglobin 13.0 - 17.0 g/dL 10.5(L) 12.5(L) 12.5(L)  Hematocrit 39.0 - 52.0 % 32.1(L) 37.5(L) 37.5  Platelets 150.0 - 400.0 K/uL 251.0 209.0 188    CMP Latest Ref Rng & Units 07/01/2021 07/01/2021 03/31/2021  Glucose 70 - 99 mg/dL 121(H) 121(H) 135(H)  BUN 6 - 23 mg/dL 32(H) - -  Creatinine 0.40 - 1.50 mg/dL 1.37 - -  Sodium 135 - 145 mEq/L 138 - -  Potassium 3.5 - 5.1 mEq/L 5.2(H) - -  Chloride 96 - 112 mEq/L 108 - -  CO2 19 - 32 mEq/L 20 - -  Calcium 8.4 - 10.5 mg/dL 9.4 - -  Total Protein 6.0 - 8.3 g/dL - - -  Total Bilirubin 0.2 - 1.2 mg/dL - - -  Alkaline Phos 39 - 117 U/L - - -  AST 0 - 37 U/L - - -  ALT 0 - 53 U/L - - -    RADIOGRAPHIC STUDIES: No images were reviewed during this visit.   ASSESSMENT & PLAN Nathan Gomez is a 80 y.o. male with stage IIB prostate cancer.  He presents to the survivorship clinic for initial meeting in routine follow-up post completion of treatment for prostate cancer.  Stage IIB prostate cancer: Mr. Schlabach has nearly recovered from definitive treatment for prostate cancer.  He had his first posttreatment PSA check today and will see Dr. Claudia Desanctis next week to review results.  Today, comprehensive survivorship care plan and treatment summary was reviewed with the patient detailing his prostate cancer diagnosis, treatment course, potential late/long-term effects of treatment, appropriate follow-up care with recommendations for the future and patient education  resources.  A copy of the summary, along with a letter will be sent to the patient's primary care provider via mail/fax, in basket message after today's visit.  Bone Health: He was given education on specific activities to promote bone health  Cancer screening: Due to Mr. Luu age, he should receive screening for skin cancers. He is up to date for his colon cancer screening. His most recent colonoscopy was 01/16/2021 with no evidence of malignancy. He will follow up with his PCP to complete recommended skin cancer screenings.  Health maintenance and wellness promotion: Mr. Treasa School was encouraged to consume 5-7 servings of fruits and vegetables per day. He was also encouraged to engage in moderate to vigorous exercise for 30 minutes per day most days of the week. We discussed the [employer] fitness program, which is designed for cancer survivors to help them become more physically fit after cancer treatments.  He was instructed to limit her alcohol consumption and continue to abstain from tobacco use.    Support services/counseling: It is not uncommon for this period of the patient's cancer care trajectory to be one of many emotions and stressors.  He was given information regarding our available services and encouraged to contact me with any questions or for help enrolling in any of our support group/programs.   FOLLOW UP: -Return to cancer center PRN -Follow up with urology next week   No orders of the defined types were placed in this encounter.   All questions were answered. The patient knows to call the clinic with any  problems, questions or concerns.  I have spent a total of 45 minutes minutes of face-to-face and non-face-to-face time, preparing to see the patient, obtaining and/or reviewing separately obtained history, performing a medically appropriate examination, counseling and educating the patient, documenting clinical information in the electronic health record, and care  coordination.   Dede Query, PA-C Department of Hematology/Oncology [place] at [facility] Phone: [phone]

## 2021-09-02 DIAGNOSIS — Z23 Encounter for immunization: Secondary | ICD-10-CM | POA: Diagnosis not present

## 2021-09-05 DIAGNOSIS — N401 Enlarged prostate with lower urinary tract symptoms: Secondary | ICD-10-CM | POA: Diagnosis not present

## 2021-09-05 DIAGNOSIS — R351 Nocturia: Secondary | ICD-10-CM | POA: Diagnosis not present

## 2021-09-05 DIAGNOSIS — C61 Malignant neoplasm of prostate: Secondary | ICD-10-CM | POA: Diagnosis not present

## 2021-09-11 DIAGNOSIS — H40053 Ocular hypertension, bilateral: Secondary | ICD-10-CM | POA: Diagnosis not present

## 2021-10-06 ENCOUNTER — Ambulatory Visit (INDEPENDENT_AMBULATORY_CARE_PROVIDER_SITE_OTHER): Payer: Medicare Other | Admitting: Family Medicine

## 2021-10-06 ENCOUNTER — Encounter: Payer: Self-pay | Admitting: Family Medicine

## 2021-10-06 ENCOUNTER — Other Ambulatory Visit: Payer: Self-pay

## 2021-10-06 VITALS — BP 132/76 | HR 86 | Temp 98.0°F | Ht 71.0 in | Wt 189.4 lb

## 2021-10-06 DIAGNOSIS — E1129 Type 2 diabetes mellitus with other diabetic kidney complication: Secondary | ICD-10-CM | POA: Diagnosis not present

## 2021-10-06 DIAGNOSIS — C61 Malignant neoplasm of prostate: Secondary | ICD-10-CM | POA: Diagnosis not present

## 2021-10-06 DIAGNOSIS — J309 Allergic rhinitis, unspecified: Secondary | ICD-10-CM | POA: Diagnosis not present

## 2021-10-06 DIAGNOSIS — I10 Essential (primary) hypertension: Secondary | ICD-10-CM | POA: Diagnosis not present

## 2021-10-06 DIAGNOSIS — U099 Post covid-19 condition, unspecified: Secondary | ICD-10-CM | POA: Diagnosis not present

## 2021-10-06 DIAGNOSIS — R053 Chronic cough: Secondary | ICD-10-CM

## 2021-10-06 DIAGNOSIS — E7800 Pure hypercholesterolemia, unspecified: Secondary | ICD-10-CM

## 2021-10-06 DIAGNOSIS — K8689 Other specified diseases of pancreas: Secondary | ICD-10-CM | POA: Diagnosis not present

## 2021-10-06 DIAGNOSIS — D649 Anemia, unspecified: Secondary | ICD-10-CM | POA: Diagnosis not present

## 2021-10-06 LAB — GLUCOSE, RANDOM: Glucose, Bld: 144 mg/dL — ABNORMAL HIGH (ref 70–99)

## 2021-10-06 LAB — CBC
HCT: 32.4 % — ABNORMAL LOW (ref 39.0–52.0)
Hemoglobin: 10.9 g/dL — ABNORMAL LOW (ref 13.0–17.0)
MCHC: 33.7 g/dL (ref 30.0–36.0)
MCV: 94.1 fl (ref 78.0–100.0)
Platelets: 202 10*3/uL (ref 150.0–400.0)
RBC: 3.44 Mil/uL — ABNORMAL LOW (ref 4.22–5.81)
RDW: 14.4 % (ref 11.5–15.5)
WBC: 4.6 10*3/uL (ref 4.0–10.5)

## 2021-10-06 LAB — HEMOGLOBIN A1C: Hgb A1c MFr Bld: 6.8 % — ABNORMAL HIGH (ref 4.6–6.5)

## 2021-10-06 NOTE — Progress Notes (Signed)
[facility] [address] [place] [address] Dept: [phone] Dept Fax: [phone]  Chronic Care Office Visit  Subjective:    Patient ID: Nathan Gomez, male    DOB: Jun 29, 1941, 80 y.o..   MRN: [id number]  Chief Complaint  Patient presents with   Follow-up    3 month f/u HTN/DM. Concerns with cough and sneezing at night since having covid on 07/27/21     History of Present Illness:  Patient is in today for reassessment of chronic medical issues.  Mr. Dastrup has a history of prostate cancer, diagnosed earlier this year. He completed 2 months of radiation treatments on July 19th. He had a recent follow-up and notes his PSA is now < 1.  Mr. Hepp has a history of pancreatic insufficiency. He takes Creon and notes that it does appear to work well. If he misses even a single dose, he notes he gets loose stools.   Mr. Sitzmann has a history of Type 2 diabetes (managed with metformin), hypertension (managed with lisinopril and amlodipine), and hyperlipidemia (managed with atorvastatin). He has Stage 3b chronic kidney disease.    Mr. Ivery has a history of Vitamin B12 deficiency with prior anemia. At his last visit, his B12 level was normal, but he was mildly anemic. He also has had issue with Vitamin D deficiency and hypomagnesemia.  Mr. Schembri had COVID back in early Sept. He was treated with a course of Paxlovid. He notes he has had some lingering cough and sneezing. He takes an OTC decongestant at night. He was started on Symbicort related to the cough. He notes this did help his symptoms early on. Now, he isn't sure if it is giving further benefit or not.  Past Medical History: Patient Active Problem List   Diagnosis Date Noted   COVID-19 long hauler manifesting chronic cough 10/06/2021   Allergic rhinitis 10/06/2021   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/25/2021   Lumbar spondylosis 04/25/2021   History of total  right hip replacement 04/25/2021   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/25/2021   Pancreatic insufficiency 03/31/2021   Irritable bowel syndrome with diarrhea 12/30/2020   Malignant neoplasm of prostate ([place]) 12/30/2020   Stage 3b chronic kidney disease ([place]) 05/01/2020   GERD (gastroesophageal reflux disease) 09/12/2017   Vitamin D deficiency 09/12/2017   Ruptured, tendon, Achilles, right, sequela 04/08/2017   Hypomagnesemia 04/08/2016   B12 deficiency 01/30/2016   Urinary retention 07/30/2015   Personal history of colonic adenomas 01/16/2013   Anemia 02/22/2012   Diabetes mellitus type II, controlled ([place]) 01/05/2011   Hyperlipidemia 01/05/2011   Essential hypertension 01/05/2011   Primary osteoarthritis of left knee 01/05/2011   Past Surgical History:  Procedure Laterality Date   CATARACT EXTRACTION  08/2012 L, 09/2012 R   COLONOSCOPY     PROSTATE BIOPSY     TONSILLECTOMY  1945   TOTAL HIP ARTHROPLASTY Left 2011   TOTAL HIP ARTHROPLASTY Right 07/09/2015   Procedure: TOTAL HIP ARTHROPLASTY ANTERIOR APPROACH;  Surgeon: Melrose Nakayama, MD;  Location: [place];  Service: Orthopedics;  Laterality: Right;   TOTAL KNEE ARTHROPLASTY Right 07/21/2016   Procedure: RIGHT TOTAL KNEE ARTHROPLASTY;  Surgeon: Melrose Nakayama, MD;  Location: [place];  Service: Orthopedics;  Laterality: Right;   Family History  Problem Relation Age of Onset   Ovarian cancer Mother    Heart disease Father    Hypertension Father    Ovarian cancer Sister    Heart disease  Paternal Uncle    Diabetes Maternal Grandfather    Pneumonia Paternal Grandfather    Brain cancer Cousin    Colon cancer Neg Hx    Esophageal cancer Neg Hx    Liver cancer Neg Hx    Pancreatic cancer Neg Hx    Rectal cancer Neg Hx    Stomach cancer Neg Hx    Outpatient Medications Prior to Visit  Medication Sig Dispense Refill   amLODipine (NORVASC) 10 MG tablet TAKE 1 TABLET DAILY 90 tablet 3   atorvastatin (LIPITOR) 40 MG tablet  Take 40 mg by mouth daily.     blood glucose meter kit and supplies KIT Use to test blood sugar up to twice a day. DX E11.09 1 each 0   budesonide-formoterol (SYMBICORT) 160-4.5 MCG/ACT inhaler Inhale 2 puffs into the lungs 2 (two) times daily. Rinse mouth with water after each use 1 each 0   Cholecalciferol (VITAMIN D) 2000 units CAPS Take 1 capsule (2,000 Units total) by mouth every morning. 90 capsule 3   Continuous Blood Gluc Receiver (FREESTYLE LIBRE 14 DAY READER) DEVI 1 Device by Does not apply route as directed. 1 each 2   Cyanocobalamin (VITAMIN B12 PO) Take 1 tablet by mouth daily.     fluticasone (FLONASE) 50 MCG/ACT nasal spray Place 1 spray into both nostrils 2 (two) times daily.     glucose blood test strip Use as instructed to test blood sugar up to twice a day. DX:  E11.09 200 each 3   Krill Oil 300 MG CAPS Take 500 mg by mouth daily.      Lancets 16L MISC Delica Fine Lancets. Use to test blood sugar up to twice a day. DX E11.09 200 each 3   lipase/protease/amylase (CREON) 36000 UNITS CPEP capsule Take by mouth.     lisinopril (ZESTRIL) 20 MG tablet TAKE 1 TABLET DAILY 90 tablet 3   Magnesium 100 MG CAPS Take 100 mg by mouth 2 (two) times daily.     metFORMIN (GLUCOPHAGE-XR) 500 MG 24 hr tablet TAKE 2 TABLETS 2 TIMES     DAILY WITH MEALS 360 tablet 1   Multiple Vitamins-Minerals (SENIOR MULTIVITAMIN PLUS) TABS Take 1 tablet by mouth daily.     tamsulosin (FLOMAX) 0.4 MG CAPS capsule Take 1 capsule (0.4 mg total) by mouth at bedtime. (Patient taking differently: Take 0.4-0.8 mg by mouth See admin instructions. Take 0.4 mg by mouth daily. Starting 7 days before procedure take 0.31m by mouth daily) 14 capsule 0   atorvastatin (LIPITOR) 40 MG tablet Take 1 tablet (40 mg total) by mouth daily. 90 tablet 3   benzonatate (TESSALON) 100 MG capsule Take 100-200 mg by mouth 3 (three) times daily as needed. (Patient not taking: Reported on 10/06/2021)     No facility-administered medications  prior to visit.   Allergies  Allergen Reactions   Nsaids     Kidney Disease   Sulfa Antibiotics Other (See Comments)    As child    Not sure rxn   Thiazide-Type Diuretics Other (See Comments)    Unknown   Objective:   Today's Vitals   10/06/21 0931  BP: 132/76  Pulse: 86  Temp: 98 F (36.7 C)  SpO2: 98%  Weight: 189 lb 6.4 oz (85.9 kg)  Height: 5' 11"  (1.803 m)   Body mass index is 26.42 kg/m.   General: Well developed, well nourished. No acute distress. Lungs: Clear to auscultation bilaterally. No wheezing, rales or rhonchi. CV: RRR without murmurs  or rubs. Pulses 2+ bilaterally. Psych: Alert and oriented. Normal mood and affect.  Health Maintenance Due  Topic Date Due   COVID-19 Vaccine (4 - Booster for Pfizer series) 10/08/2020   Lab Results BMP Latest Ref Rng & Units 07/01/2021 07/01/2021 03/31/2021  Glucose 70 - 99 mg/dL 121(H) 121(H) 135(H)  BUN 6 - 23 mg/dL 32(H) - -  Creatinine 0.40 - 1.50 mg/dL 1.37 - -  BUN/Creat Ratio 10 - 24 - - -  Sodium 135 - 145 mEq/L 138 - -  Potassium 3.5 - 5.1 mEq/L 5.2(H) - -  Chloride 96 - 112 mEq/L 108 - -  CO2 19 - 32 mEq/L 20 - -  Calcium 8.4 - 10.5 mg/dL 9.4 - -   Lab Results  Component Value Date   WBC 6.1 07/01/2021   HGB 10.5 (L) 07/01/2021   HCT 32.1 (L) 07/01/2021   MCV 94.6 07/01/2021   PLT 251.0 07/01/2021   Lab Results  Component Value Date   CHOL 113 07/01/2021   HDL 37.40 (L) 07/01/2021   LDLCALC 54 07/01/2021   LDLDIRECT 99.0 05/01/2020   TRIG 108.0 07/01/2021   CHOLHDL 3 07/01/2021   Lab Results  Component Value Date   HGBA1C 7.0 (H) 07/01/2021   Component Ref Range & Units 3 mo ago 9 mo ago 1 yr ago 2 yr ago 3 yr ago 4 yr ago 5 yr ago  Vitamin B-12 211 - 911 pg/mL 410  483  280  517 R  1,550 High  R  495  344    Component Ref Range & Units 3 mo ago  (07/01/21) 9 mo ago  (12/30/20) 1 yr ago  (05/01/20) 3 yr ago  (04/08/18) 4 yr ago  (06/15/17) 4 yr ago  (02/23/17) 6 yr ago  (01/29/15)  Magnesium  1.5 - 2.5 mg/dL 1.3 Low   1.1 Low   1.5  1.2 Low  R  1.1 Low  R  1.1 Low   1.3 Low    Component Ref Range & Units 3 mo ago 9 mo ago 1 yr ago  VITD 30.00 - 100.00 ng/mL 53.74  45.93  43.27      Assessment & Plan:   1. Controlled type 2 diabetes mellitus with other diabetic kidney complication, without long-term current use of insulin ([place]) Diabetes has been well controlled on metformin. We will check quarterly labs today.  - Glucose, random - Hemoglobin A1c  2. Essential hypertension Blood pressure is at goal. Continue amlodipine and lisinopril.  3. Pancreatic insufficiency Stable on Creon.  4. Malignant neoplasm of prostate ([place]) Completed treatment. PSA is low. We will continue surveillance.  5. Pure hypercholesterolemia Lipids at goal at last visit. We will recheck this in 3 months.  6. COVID-19 long hauler manifesting chronic cough We discussed that the cough should gradually improve with time. I recommend we give a trial off of the Symbicort. If the cough worsens over the next 2 weeks, we can restart this.  7. Allergic rhinitis, unspecified seasonality, unspecified trigger I recommend Mr. Gavia continue use of his steroid nasal spray. I recommend he add a 2nd generation antihistamine to see if this will reduce his sneezing and congestion.  8. Anemia, unspecified type We will recheck his anemia today.  - CBC  Haydee Salter, MD

## 2021-10-08 DIAGNOSIS — M1712 Unilateral primary osteoarthritis, left knee: Secondary | ICD-10-CM | POA: Diagnosis not present

## 2021-11-28 DIAGNOSIS — K8681 Exocrine pancreatic insufficiency: Secondary | ICD-10-CM | POA: Diagnosis not present

## 2021-12-03 DIAGNOSIS — C61 Malignant neoplasm of prostate: Secondary | ICD-10-CM | POA: Diagnosis not present

## 2021-12-10 DIAGNOSIS — R351 Nocturia: Secondary | ICD-10-CM | POA: Diagnosis not present

## 2021-12-10 DIAGNOSIS — C61 Malignant neoplasm of prostate: Secondary | ICD-10-CM | POA: Diagnosis not present

## 2021-12-10 DIAGNOSIS — N401 Enlarged prostate with lower urinary tract symptoms: Secondary | ICD-10-CM | POA: Diagnosis not present

## 2021-12-15 DIAGNOSIS — M1712 Unilateral primary osteoarthritis, left knee: Secondary | ICD-10-CM | POA: Diagnosis not present

## 2021-12-22 DIAGNOSIS — M1712 Unilateral primary osteoarthritis, left knee: Secondary | ICD-10-CM | POA: Diagnosis not present

## 2021-12-23 ENCOUNTER — Other Ambulatory Visit: Payer: Self-pay | Admitting: Family Medicine

## 2021-12-23 DIAGNOSIS — E1129 Type 2 diabetes mellitus with other diabetic kidney complication: Secondary | ICD-10-CM

## 2021-12-29 DIAGNOSIS — M1712 Unilateral primary osteoarthritis, left knee: Secondary | ICD-10-CM | POA: Diagnosis not present

## 2022-01-06 ENCOUNTER — Encounter: Payer: Self-pay | Admitting: Family Medicine

## 2022-01-06 ENCOUNTER — Other Ambulatory Visit: Payer: Self-pay

## 2022-01-06 ENCOUNTER — Ambulatory Visit (INDEPENDENT_AMBULATORY_CARE_PROVIDER_SITE_OTHER): Payer: Medicare Other | Admitting: Family Medicine

## 2022-01-06 VITALS — BP 130/66 | HR 75 | Temp 97.5°F | Wt 191.4 lb

## 2022-01-06 DIAGNOSIS — K8689 Other specified diseases of pancreas: Secondary | ICD-10-CM | POA: Diagnosis not present

## 2022-01-06 DIAGNOSIS — M1712 Unilateral primary osteoarthritis, left knee: Secondary | ICD-10-CM | POA: Diagnosis not present

## 2022-01-06 DIAGNOSIS — I10 Essential (primary) hypertension: Secondary | ICD-10-CM | POA: Diagnosis not present

## 2022-01-06 DIAGNOSIS — E1122 Type 2 diabetes mellitus with diabetic chronic kidney disease: Secondary | ICD-10-CM

## 2022-01-06 DIAGNOSIS — N1832 Chronic kidney disease, stage 3b: Secondary | ICD-10-CM | POA: Diagnosis not present

## 2022-01-06 DIAGNOSIS — E782 Mixed hyperlipidemia: Secondary | ICD-10-CM

## 2022-01-06 LAB — BASIC METABOLIC PANEL
BUN: 25 mg/dL — ABNORMAL HIGH (ref 6–23)
CO2: 26 mEq/L (ref 19–32)
Calcium: 9.5 mg/dL (ref 8.4–10.5)
Chloride: 106 mEq/L (ref 96–112)
Creatinine, Ser: 1.38 mg/dL (ref 0.40–1.50)
GFR: 47.56 mL/min — ABNORMAL LOW (ref >60.00)
Glucose, Bld: 209 mg/dL — ABNORMAL HIGH (ref 70–99)
Potassium: 4.4 mEq/L (ref 3.5–5.1)
Sodium: 139 mEq/L (ref 135–145)

## 2022-01-06 LAB — MICROALBUMIN / CREATININE URINE RATIO
Creatinine,U: 71.7 mg/dL
Microalb Creat Ratio: 29.7 mg/g (ref 0.0–30.0)
Microalb, Ur: 21.3 mg/dL — ABNORMAL HIGH (ref 0.0–1.9)

## 2022-01-06 LAB — URINALYSIS, ROUTINE W REFLEX MICROSCOPIC
Bilirubin Urine: NEGATIVE
Hgb urine dipstick: NEGATIVE
Ketones, ur: NEGATIVE
Leukocytes,Ua: NEGATIVE
Nitrite: NEGATIVE
RBC / HPF: NONE SEEN (ref 0–?)
Specific Gravity, Urine: 1.02 (ref 1.000–1.030)
Total Protein, Urine: 30 — AB
Urine Glucose: NEGATIVE
Urobilinogen, UA: 0.2 (ref 0.0–1.0)
pH: 6 (ref 5.0–8.0)

## 2022-01-06 LAB — HEMOGLOBIN A1C: Hgb A1c MFr Bld: 7 % — ABNORMAL HIGH (ref 4.6–6.5)

## 2022-01-06 NOTE — Progress Notes (Signed)
[facility] [address] [place] [address] Dept: [phone] Dept Fax: [phone]  Chronic Care Office Visit  Subjective:    Patient ID: Nathan Gomez, male    DOB: [birth year], 81 y.o..   MRN: [id number]  Chief Complaint  Patient presents with   Follow-up    3 month f/u meds.  No concerns.  Not fasitng.     History of Present Illness:  Patient is in today for reassessment of chronic medical issues.  Mr. Turrell has a history of Type 2 diabetes (managed with metformin), hypertension (managed with lisinopril and amlodipine), and hyperlipidemia (managed with atorvastatin). He has Stage 3b chronic kidney disease.   Mr. Hohn has a history of pancreatic insufficiency. He takes Creon and notes that it does appear to work well. If he misses even a single dose, he notes he gets some diarrhea, but feels he is figuring this out better.    Mr. Fails had COVID back in early Sept. He had some lingering cough and was on Symbicort for a time, but feels this is all now resolved.  Mr. Stafford has a history of osteoarthritis. He has had joint replacements for both hips and the right knee. He continues to have left knee pain and stiffness. he is seeing orthopedics about every 3 months for intraarticular gel injections.  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/25/2021   Pancreatic insufficiency 03/31/2021   Irritable bowel syndrome with diarrhea 12/30/2020   Malignant neoplasm of prostate ([place]) 12/30/2020   Stage 3b chronic kidney disease ([place]) 05/01/2020   GERD (gastroesophageal reflux disease) 09/12/2017   Vitamin D deficiency  09/12/2017   Ruptured, tendon, Achilles, right, sequela 04/08/2017   Hypomagnesemia 04/08/2016   B12 deficiency 01/30/2016   Urinary retention 07/30/2015   Personal history of colonic adenomas 01/16/2013   Anemia 02/22/2012   Type 2 diabetes mellitus with stage 3b chronic kidney disease ([place]) 01/05/2011   Hyperlipidemia 01/05/2011   Essential hypertension 01/05/2011   Primary osteoarthritis of left knee 01/05/2011   Past Surgical History:  Procedure Laterality Date   CATARACT EXTRACTION  08/2012 L, 09/2012 R   COLONOSCOPY     PROSTATE BIOPSY     TONSILLECTOMY  1945   TOTAL HIP ARTHROPLASTY Left 2011   TOTAL HIP ARTHROPLASTY Right 07/09/2015   Procedure: TOTAL HIP ARTHROPLASTY ANTERIOR APPROACH;  Surgeon: Melrose Nakayama, MD;  Location: [facility];  Service: Orthopedics;  Laterality: Right;   TOTAL KNEE ARTHROPLASTY Right 07/21/2016   Procedure: RIGHT TOTAL KNEE ARTHROPLASTY;  Surgeon: Melrose Nakayama, MD;  Location: [facility];  Service: Orthopedics;  Laterality: Right;   Family History  Problem Relation Age of Onset   Ovarian cancer Mother    Heart disease Father    Hypertension Father    Ovarian cancer Sister    Heart disease Paternal Uncle    Diabetes Maternal Grandfather    Pneumonia Paternal Grandfather    Brain cancer Cousin    Colon cancer Neg Hx    Esophageal cancer Neg Hx    Liver cancer Neg Hx    Pancreatic cancer Neg Hx    Rectal cancer Neg Hx    Stomach cancer Neg Hx  Outpatient Medications Prior to Visit  Medication Sig Dispense Refill   amLODipine (NORVASC) 10 MG tablet TAKE 1 TABLET DAILY 90 tablet 3   atorvastatin (LIPITOR) 40 MG tablet Take 40 mg by mouth daily.     blood glucose meter kit and supplies KIT Use to test blood sugar up to twice a day. DX E11.09 1 each 0   Cholecalciferol (VITAMIN D) 2000 units CAPS Take 1 capsule (2,000 Units total) by mouth every morning. 90 capsule 3   Continuous Blood Gluc Receiver (FREESTYLE LIBRE 14 DAY READER) DEVI 1 Device by  Does not apply route as directed. 1 each 2   Cyanocobalamin (VITAMIN B12 PO) Take 1 tablet by mouth daily.     fluticasone (FLONASE) 50 MCG/ACT nasal spray Place 1 spray into both nostrils 2 (two) times daily.     glucose blood test strip Use as instructed to test blood sugar up to twice a day. DX:  E11.09 200 each 3   Krill Oil 300 MG CAPS Take 500 mg by mouth daily.      Lancets 67R MISC Delica Fine Lancets. Use to test blood sugar up to twice a day. DX E11.09 200 each 3   lipase/protease/amylase (CREON) 36000 UNITS CPEP capsule Take by mouth.     lisinopril (ZESTRIL) 20 MG tablet TAKE 1 TABLET DAILY 90 tablet 3   Magnesium 100 MG CAPS Take 100 mg by mouth 2 (two) times daily.     metFORMIN (GLUCOPHAGE-XR) 500 MG 24 hr tablet TAKE 2 TABLETS 2 TIMES     DAILY WITH MEALS 360 tablet 1   Multiple Vitamins-Minerals (SENIOR MULTIVITAMIN PLUS) TABS Take 1 tablet by mouth daily.     tamsulosin (FLOMAX) 0.4 MG CAPS capsule Take 1 capsule (0.4 mg total) by mouth at bedtime. (Patient taking differently: Take 0.4-0.8 mg by mouth See admin instructions. Take 0.4 mg by mouth daily. Starting 7 days before procedure take 0.33m by mouth daily) 14 capsule 0   budesonide-formoterol (SYMBICORT) 160-4.5 MCG/ACT inhaler Inhale 2 puffs into the lungs 2 (two) times daily. Rinse mouth with water after each use (Patient not taking: Reported on 01/06/2022) 1 each 0   No facility-administered medications prior to visit.   Allergies  Allergen Reactions   Nsaids     Kidney Disease   Sulfa Antibiotics Other (See Comments)    As child    Not sure rxn   Thiazide-Type Diuretics Other (See Comments)    Unknown      Objective:   Today's Vitals   01/06/22 0906  BP: 130/66  Pulse: 75  Temp: (!) 97.5 F (36.4 C)  TempSrc: Temporal  SpO2: 97%  Weight: 191 lb 6.4 oz (86.8 kg)   Body mass index is 26.69 kg/m.   General: Well developed, well nourished. No acute distress. Psych: Alert and oriented. Normal mood and  affect.  There are no preventive care reminders to display for this patient.  Lab Results Last lipids Lab Results  Component Value Date   CHOL 113 07/01/2021   HDL 37.40 (L) 07/01/2021   LDLCALC 54 07/01/2021   LDLDIRECT 99.0 05/01/2020   TRIG 108.0 07/01/2021   CHOLHDL 3 07/01/2021   Last hemoglobin A1c Lab Results  Component Value Date   HGBA1C 6.8 (H) 10/06/2021   Assessment & Plan:   1. Type 2 diabetes mellitus with stage 3b chronic kidney disease, without long-term current use of insulin ([place] Due for annual DM labs today. I will continue Mr. LBreauon metformin.  -  Microalbumin / creatinine urine ratio - Basic metabolic panel - Hemoglobin A1c - Urinalysis, Routine w reflex microscopic  2. Essential hypertension Blood pressure at goal. Continue amlodipine and lisinopril.  3. Pancreatic insufficiency Stable on Creon. Sees GI annually.  4. Stage 3b chronic kidney disease ([place]) Meeting blood pressure goals. Diabetes in good control.  5. Primary osteoarthritis of left knee Stable on Synvisc injections.  6. Mixed hyperlipidemia Last lipids at goal. Plan to reassess in Aug. Continue atorvastatin.  Haydee Salter, MD

## 2022-01-12 DIAGNOSIS — M1712 Unilateral primary osteoarthritis, left knee: Secondary | ICD-10-CM | POA: Diagnosis not present

## 2022-01-19 DIAGNOSIS — M1712 Unilateral primary osteoarthritis, left knee: Secondary | ICD-10-CM | POA: Diagnosis not present

## 2022-02-04 ENCOUNTER — Encounter (HOSPITAL_COMMUNITY): Payer: Self-pay

## 2022-02-04 ENCOUNTER — Ambulatory Visit (HOSPITAL_COMMUNITY)
Admission: EM | Admit: 2022-02-04 | Discharge: 2022-02-04 | Disposition: A | Discharge status: Home / Self Care | Payer: Medicare Other | Attending: Nurse Practitioner | Admitting: Nurse Practitioner

## 2022-02-04 DIAGNOSIS — H6122 Impacted cerumen, left ear: Secondary | ICD-10-CM

## 2022-02-04 NOTE — ED Triage Notes (Signed)
Pt c/o lt ear feeling clogged a month ago and used drops with no help so this week use a syringe and made it worse. States now having pressure when coughing and can hear pulse in ear. ?

## 2022-02-04 NOTE — ED Provider Notes (Signed)
?[facility] ? ? ? ?CSN: [id number] ?Arrival date & time: 02/04/22  1736 ? ? ?  ? ?History   ?Chief Complaint ?Chief Complaint  ?Patient presents with  ? Otalgia  ? ? ?HPI ?MIKAELE STECHER is a 81 y.o. male.  ? ?HisThe patient is a 82 year old male who presents for complaints of left ear pain.  Patient states symptoms started about a month ago, he started using earwax softener drops to help with his cerumen impaction.  Patient states symptoms did not improve much and over the past couple of days, he used a syringe to try to get the wax out of his ear.  He states since that time he has had worsening ear pain, feeling like he can hear his pulse in his ears, muffled hearing.  He denies any ear drainage, fever, chills, loss of hearing, or upper respiratory symptoms.   ? ? ?Otalgia ?Associated symptoms: no ear discharge   ? ?Past Medical History:  ?Diagnosis Date  ? Achilles tendinitis   ? Allergy   ? Cataract   ? Chronic kidney disease   ? Complication of anesthesia   ? urinary retention  ? Degenerative tear of left medial meniscus 11/2014  ? Diabetes mellitus, type 2 ([place])   ? Dyslipidemia   ? GERD (gastroesophageal reflux disease)   ? Glaucoma   ? Heart murmur   ? child  ? Hyperlipidemia   ? Hypertension   ? Osteoarthritis   ? right knee  ? Personal history of colonic adenomas 01/16/2013  ? Plantar fasciitis   ? Prostate cancer ([place])   ? ? ?Patient Active Problem List  ? Diagnosis Date Noted  ? Allergic rhinitis 10/06/2021  ? 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/25/2021  ? Lumbar spondylosis 04/25/2021  ? 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 Pancreatic insufficiency 03/31/2021  ? Irritable bowel syndrome with diarrhea 12/30/2020  ? Malignant neoplasm of prostate ([place]) 12/30/2020  ? Stage 3b chronic kidney disease ([place]) 05/01/2020  ? GERD (gastroesophageal reflux disease) 09/12/2017  ? Vitamin D deficiency 09/12/2017  ? Ruptured,  tendon, Achilles, right, sequela 04/08/2017  ? Hypomagnesemia 04/08/2016  ? B12 deficiency 01/30/2016  ? Urinary retention 07/30/2015  ? Personal history of colonic adenomas 01/16/2013  ? Normocytic anemia 02/22/2012  ? Type 2 diabetes mellitus with stage 3b chronic kidney disease ([place]) 01/05/2011  ? Hyperlipidemia 01/05/2011  ? Essential hypertension 01/05/2011  ? Primary osteoarthritis of left knee 01/05/2011  ? ? ?Past Surgical History:  ?Procedure Laterality Date  ? CATARACT EXTRACTION  08/2012 L, 09/2012 R  ? COLONOSCOPY    ? PROSTATE BIOPSY    ? TONSILLECTOMY  1945  ? TOTAL HIP ARTHROPLASTY Left 2011  ? TOTAL HIP ARTHROPLASTY Right 07/09/2015  ? Procedure: TOTAL HIP ARTHROPLASTY ANTERIOR APPROACH;  Surgeon: Melrose Nakayama, MD;  Location: [facility];  Service: Orthopedics;  Laterality: Right;  ? TOTAL KNEE ARTHROPLASTY Right 07/21/2016  ? Procedure: RIGHT TOTAL KNEE ARTHROPLASTY;  Surgeon: Melrose Nakayama, MD;  Location: [facility];  Service: Orthopedics;  Laterality: Right;  ? ? ? ? ? ?Home Medications   ? ?Prior to Admission medications   ?Medication Sig Start Date End Date Taking? Authorizing Provider  ?amLODipine (NORVASC) 10 MG tablet TAKE 1 TABLET DAILY 04/15/21   Libby Maw, MD  ?atorvastatin (LIPITOR) 40 MG tablet Take 40 mg by mouth daily.    [provider]  ?blood glucose meter kit  and supplies KIT Use to test blood sugar up to twice a day. DX E11.09 06/20/20   Libby Maw, MD  ?Cholecalciferol (VITAMIN D) 2000 units CAPS Take 1 capsule (2,000 Units total) by mouth every morning. 12/03/17   Mellody Dance, DO  ?Continuous Blood Gluc Receiver (FREESTYLE LIBRE 14 DAY READER) DEVI 1 Device by Does not apply route as directed. 05/28/20   Libby Maw, MD  ?Cyanocobalamin (VITAMIN B12 PO) Take 1 tablet by mouth daily.    [provider]  ?fluticasone (FLONASE) 50 MCG/ACT nasal spray Place 1 spray into both nostrils 2 (two) times daily. 08/08/21   [provider]  ?glucose blood test strip Use as instructed to test blood sugar up to twice a day. DX:  E11.09 07/30/15   Rowe Clack, MD  ?Astrid Drafts 300 MG CAPS Take 500 mg by mouth daily.     [provider]  ?Lancets 99M MISC Delica Fine Lancets. Use to test blood sugar up to twice a day. DX E11.09 07/30/15   Rowe Clack, MD  ?lipase/protease/amylase (CREON) 36000 UNITS CPEP capsule Take by mouth.    [provider]  ?lisinopril (ZESTRIL) 20 MG tablet TAKE 1 TABLET DAILY 04/15/21   Libby Maw, MD  ?Magnesium 100 MG CAPS Take 100 mg by mouth 2 (two) times daily.    [provider]  ?metFORMIN (GLUCOPHAGE-XR) 500 MG 24 hr tablet TAKE 2 TABLETS 2 TIMES     DAILY WITH MEALS 12/23/21   Haydee Salter, MD  ?Multiple Vitamins-Minerals (SENIOR MULTIVITAMIN PLUS) TABS Take 1 tablet by mouth daily.    [provider]  ?tamsulosin (FLOMAX) 0.4 MG CAPS capsule Take 1 capsule (0.4 mg total) by mouth at bedtime. ?Patient taking differently: Take 0.4-0.8 mg by mouth See admin instructions. Take 0.4 mg by mouth daily. Starting 7 days before procedure take 0.73m by mouth daily 03/27/16   BBinnie Rail MD  ? ? ?Family History ?Family History  ?Problem Relation Age of Onset  ? Ovarian cancer Mother   ? Heart disease Father   ? Hypertension Father   ? Ovarian cancer Sister   ? Heart disease Paternal Uncle   ? Diabetes Maternal Grandfather   ? Pneumonia Paternal Grandfather   ? Brain cancer Cousin   ? Colon cancer Neg Hx   ? Esophageal cancer Neg Hx   ? Liver cancer Neg Hx   ? Pancreatic cancer Neg Hx   ? Rectal cancer Neg Hx   ? Stomach cancer Neg Hx   ? ? ?Social History ?Social History  ? ?Tobacco Use  ? Smoking status: Former  ?  Packs/day: 1.00  ?  Years: 20.00  ?  Pack years: 20.00  ?  Types: Cigarettes  ?  Quit date: 11/16/1986  ?  Years since quitting: 35.2  ? Smokeless tobacco: Never  ?Vaping Use  ? Vaping Use: Never used  ?Substance Use Topics  ? Alcohol use: Yes  ?   Alcohol/week: 1.0 - 2.0 standard drink  ?  Types: 1 - 2 Glasses of wine per week  ?  Comment: occ  ? Drug use: No  ? ? ? ?Allergies   ?Nsaids, Sulfa antibiotics, and Thiazide-type diuretics ? ? ?Review of Systems ?Review of Systems  ?Constitutional: Negative.   ?HENT:  Positive for ear pain. Negative for ear discharge.   ?Eyes: Negative.   ?Respiratory: Negative.    ?Cardiovascular: Negative.   ?Skin: Negative.   ?Psychiatric/Behavioral: Negative.    ? ? ?  Physical Exam ?Triage Vital Signs ?ED Triage Vitals [02/04/22 1852]  ?Enc Vitals Group  ?   BP 138/77  ?   Pulse Rate 76  ?   Resp 18  ?   Temp 98.1 ?F (36.7 ?C)  ?   Temp Source Oral  ?   SpO2 96 %  ?   Weight   ?   Height   ?   Head Circumference   ?   Peak Flow   ?   Pain Score 0  ?   Pain Loc   ?   Pain Edu?   ?   Excl. in [place]?   ? ?No data found. ? ?Updated Vital Signs ?BP 138/77 (BP Location: Left Arm)   Pulse 76   Temp 98.1 ?F (36.7 ?C) (Oral)   Resp 18   SpO2 96%  ? ?Visual Acuity ?Right Eye Distance:   ?Left Eye Distance:   ?Bilateral Distance:   ? ?Right Eye Near:   ?Left Eye Near:    ?Bilateral Near:    ? ?Physical Exam ?Vitals reviewed.  ?Constitutional:   ?   General: He is not in acute distress. ?   Appearance: Normal appearance.  ?HENT:  ?   Head: Normocephalic and atraumatic.  ?   Right Ear: Tympanic membrane, ear canal and external ear normal. There is no impacted cerumen.  ?   Left Ear: Ear canal and external ear normal. There is impacted cerumen.  ?   Ears:  ?   Comments: Ear irrigation performed to the left ear. Patient tolerated well. Able to visualize TM, no bulging or erythema noted. Patient reports improved hearing in the left ear.  ?   Nose: No congestion or rhinorrhea.  ?   Mouth/Throat:  ?   Mouth: Mucous membranes are moist.  ?Eyes:  ?   Extraocular Movements: Extraocular movements intact.  ?   Conjunctiva/sclera: Conjunctivae normal.  ?   Pupils: Pupils are equal, round, and reactive to light.  ?Cardiovascular:  ?   Rate and Rhythm:  Normal rate and regular rhythm.  ?   Pulses: Normal pulses.  ?   Heart sounds: Normal heart sounds.  ?Pulmonary:  ?   Effort: Pulmonary effort is normal.  ?   Breath sounds: Normal breath sounds.  ?Musculoskeletal:  ?   C

## 2022-02-04 NOTE — Discharge Instructions (Addendum)
You may continue to use the earwax softener.  I do recommend using it 1-2 times per week. ?Follow-up with your regular doctor for regular irrigation of your ears. ?Follow-up if symptoms return. ?

## 2022-03-12 DIAGNOSIS — H40053 Ocular hypertension, bilateral: Secondary | ICD-10-CM | POA: Diagnosis not present

## 2022-03-22 ENCOUNTER — Other Ambulatory Visit: Payer: Self-pay | Admitting: Family Medicine

## 2022-03-22 DIAGNOSIS — E1159 Type 2 diabetes mellitus with other circulatory complications: Secondary | ICD-10-CM

## 2022-04-08 ENCOUNTER — Ambulatory Visit (INDEPENDENT_AMBULATORY_CARE_PROVIDER_SITE_OTHER): Payer: Medicare Other | Admitting: Family Medicine

## 2022-04-08 ENCOUNTER — Encounter: Payer: Self-pay | Admitting: Family Medicine

## 2022-04-08 VITALS — BP 130/78 | HR 69 | Temp 97.8°F | Ht 71.0 in | Wt 192.2 lb

## 2022-04-08 DIAGNOSIS — H6122 Impacted cerumen, left ear: Secondary | ICD-10-CM | POA: Diagnosis not present

## 2022-04-08 DIAGNOSIS — I10 Essential (primary) hypertension: Secondary | ICD-10-CM

## 2022-04-08 DIAGNOSIS — I440 Atrioventricular block, first degree: Secondary | ICD-10-CM

## 2022-04-08 DIAGNOSIS — N1832 Chronic kidney disease, stage 3b: Secondary | ICD-10-CM | POA: Diagnosis not present

## 2022-04-08 DIAGNOSIS — E1122 Type 2 diabetes mellitus with diabetic chronic kidney disease: Secondary | ICD-10-CM | POA: Diagnosis not present

## 2022-04-08 DIAGNOSIS — R0609 Other forms of dyspnea: Secondary | ICD-10-CM

## 2022-04-08 LAB — COMPREHENSIVE METABOLIC PANEL
ALT: 12 U/L (ref 0–53)
AST: 11 U/L (ref 0–37)
Albumin: 4.4 g/dL (ref 3.5–5.2)
Alkaline Phosphatase: 92 U/L (ref 39–117)
BUN: 26 mg/dL — ABNORMAL HIGH (ref 6–23)
CO2: 25 mEq/L (ref 19–32)
Calcium: 9.4 mg/dL (ref 8.4–10.5)
Chloride: 104 mEq/L (ref 96–112)
Creatinine, Ser: 1.5 mg/dL (ref 0.40–1.50)
GFR: 42.95 mL/min — ABNORMAL LOW (ref >60.00)
Glucose, Bld: 153 mg/dL — ABNORMAL HIGH (ref 70–99)
Potassium: 4.7 mEq/L (ref 3.5–5.1)
Sodium: 138 mEq/L (ref 135–145)
Total Bilirubin: 0.5 mg/dL (ref 0.2–1.2)
Total Protein: 6.9 g/dL (ref 6.0–8.3)

## 2022-04-08 LAB — CBC
HCT: 35.8 % — ABNORMAL LOW (ref 39.0–52.0)
Hemoglobin: 11.7 g/dL — ABNORMAL LOW (ref 13.0–17.0)
MCHC: 32.8 g/dL (ref 30.0–36.0)
MCV: 93.9 fl (ref 78.0–100.0)
Platelets: 225 10*3/uL (ref 150.0–400.0)
RBC: 3.82 Mil/uL — ABNORMAL LOW (ref 4.22–5.81)
RDW: 14 % (ref 11.5–15.5)
WBC: 5.4 10*3/uL (ref 4.0–10.5)

## 2022-04-08 LAB — HEMOGLOBIN A1C: Hgb A1c MFr Bld: 7.1 % — ABNORMAL HIGH (ref 4.6–6.5)

## 2022-04-08 LAB — TSH: TSH: 3.11 u[IU]/mL (ref 0.35–5.50)

## 2022-04-08 NOTE — Progress Notes (Signed)
[facility] [address] [place] [address] Dept: [phone] Dept Fax: [phone]  Chronic Care Office Visit  Subjective:    Patient ID: Nathan Gomez, male    DOB: [birth year], 81 y.o..   MRN: [id number]  Chief Complaint  Patient presents with   Follow-up    3 month f/u.  C/o wax build up, SOB, pain in hips/back when walking.     History of Present Illness:  Patient is in today for reassessment of chronic medical issues.  Nathan Gomez has a history of Type 2 diabetes (managed with metformin), hypertension (managed with lisinopril and amlodipine), and hyperlipidemia (managed with atorvastatin). He has Stage 3b chronic kidney disease.    Nathan Gomez has a history of pancreatic insufficiency. He takes Creon and notes that it does appear to work well. If he misses even a single dose, he notes he gets some diarrhea. he usually has some symptoms for a day or two afterwards, but then things settle back out.    Nathan Gomez notes a 1-2 month history of dyspnea with exertion. He notes that this resolves fairly quickly with rest. He also gets some occasional lightheadedness when he first stand sup int he morning. He remains active despite all of this. He admits to using 3 pillows at night now, though he characterizes this as being to manage snoring. He does note waking up occasionally at night "gasping for air". He has a history of hypertension and chronic pedal edema. He has noted more congestion than usual this pollen season.    Nathan Gomez notes his left ear feels blocked. He has used a home ear wax kit and been unable to relieve the obstruction.  Nathan Gomez also notes some burning sensation periodically in the lower back and hip areas. This is worse after days when he has been more active.  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/25/2021   Pancreatic insufficiency 03/31/2021   Irritable bowel syndrome with diarrhea 12/30/2020   Malignant neoplasm of prostate ([place]) 12/30/2020   Stage 3b chronic kidney disease ([place]) 05/01/2020   GERD (gastroesophageal reflux disease) 09/12/2017   Vitamin D deficiency 09/12/2017   Ruptured, tendon, Achilles, right, sequela 04/08/2017   Hypomagnesemia 04/08/2016   B12 deficiency 01/30/2016   Urinary retention 07/30/2015   Personal history of colonic adenomas 01/16/2013   Normocytic anemia 02/22/2012   Type 2 diabetes mellitus with stage 3b chronic kidney disease ([place]) 01/05/2011   Hyperlipidemia 01/05/2011   Essential hypertension 01/05/2011   Primary osteoarthritis of left knee 01/05/2011   Past Surgical History:  Procedure Laterality Date   CATARACT EXTRACTION  08/2012 L, 09/2012 R   COLONOSCOPY     PROSTATE BIOPSY     TONSILLECTOMY  1945   TOTAL HIP ARTHROPLASTY Left 2011   TOTAL HIP ARTHROPLASTY Right 07/09/2015   Procedure: TOTAL HIP ARTHROPLASTY ANTERIOR APPROACH;  Surgeon: Melrose Nakayama, MD;  Location: [facility];  Service: Orthopedics;  Laterality: Right;   TOTAL KNEE ARTHROPLASTY Right 07/21/2016   Procedure: RIGHT TOTAL KNEE ARTHROPLASTY;  Surgeon: Melrose Nakayama, MD;  Location: [facility];  Service: Orthopedics;  Laterality: Right;   Family History  Problem Relation Age of Onset  Ovarian cancer Mother    Heart disease Father    Hypertension Father    Ovarian cancer Sister    Heart disease Paternal Uncle    Diabetes Maternal Grandfather    Pneumonia Paternal Grandfather    Brain cancer Cousin    Colon cancer Neg Hx    Esophageal cancer Neg Hx    Liver cancer Neg Hx    Pancreatic cancer Neg Hx    Rectal cancer Neg Hx    Stomach cancer Neg Hx    Outpatient Medications Prior to Visit   Medication Sig Dispense Refill   amLODipine (NORVASC) 10 MG tablet TAKE 1 TABLET DAILY 90 tablet 3   atorvastatin (LIPITOR) 40 MG tablet Take 40 mg by mouth daily.     blood glucose meter kit and supplies KIT Use to test blood sugar up to twice a day. DX E11.09 1 each 0   Cholecalciferol (VITAMIN D) 2000 units CAPS Take 1 capsule (2,000 Units total) by mouth every morning. 90 capsule 3   Continuous Blood Gluc Receiver (FREESTYLE LIBRE 14 DAY READER) DEVI 1 Device by Does not apply route as directed. 1 each 2   Cyanocobalamin (VITAMIN B12 PO) Take 1 tablet by mouth daily.     fluticasone (FLONASE) 50 MCG/ACT nasal spray Place 1 spray into both nostrils 2 (two) times daily.     glucose blood test strip Use as instructed to test blood sugar up to twice a day. DX:  E11.09 200 each 3   Krill Oil 300 MG CAPS Take 500 mg by mouth daily.      Lancets 22W MISC Delica Fine Lancets. Use to test blood sugar up to twice a day. DX E11.09 200 each 3   lipase/protease/amylase (CREON) 36000 UNITS CPEP capsule Take by mouth.     lisinopril (ZESTRIL) 20 MG tablet TAKE 1 TABLET DAILY 90 tablet 3   Magnesium 100 MG CAPS Take 100 mg by mouth 2 (two) times daily.     metFORMIN (GLUCOPHAGE-XR) 500 MG 24 hr tablet TAKE 2 TABLETS 2 TIMES     DAILY WITH MEALS 360 tablet 1   Multiple Vitamins-Minerals (SENIOR MULTIVITAMIN PLUS) TABS Take 1 tablet by mouth daily.     tamsulosin (FLOMAX) 0.4 MG CAPS capsule Take 1 capsule (0.4 mg total) by mouth at bedtime. (Patient taking differently: Take 0.4-0.8 mg by mouth See admin instructions. Take 0.4 mg by mouth daily. Starting 7 days before procedure take 0.63m by mouth daily) 14 capsule 0   No facility-administered medications prior to visit.   Allergies  Allergen Reactions   Nsaids     Kidney Disease   Sulfa Antibiotics Other (See Comments)    As child    Not sure rxn   Thiazide-Type Diuretics Other (See Comments)    Unknown    Objective:   Today's Vitals   04/08/22  0925  BP: 130/78  Pulse: 69  Temp: 97.8 F (36.6 C)  TempSrc: Temporal  SpO2: 96%  Weight: 192 lb 3.2 oz (87.2 kg)  Height: 5' 11"  (1.803 m)   Body mass index is 26.81 kg/m.   General: Well developed, well nourished. No acute distress. HEENT: Normocephalic, non-traumatic. External ears normal. Left EAC obstructed with wax.  Neck: Supple. +/- mild JVD Lungs: Clear to auscultation bilaterally. No wheezing, rales or rhonchi. CV: RRR with ? low grade systolic murmur. Pulses 2+ bilaterally. Extremities: 1-2+ edema noted. Psych: Alert and oriented. Normal mood and affect.  There are no preventive care reminders  to display for this patient.  EKG: Sinus rhythm with a 1st degree heart block (new since 2021).    PROCEDURE- Ear Wax Removal Indication: Impacted ear wax  PARQ reviewed with patient. Verbal consent obtained. Flushed ear with mixture of warm water and hydrogen peroxide. Lighted curette used to remove wax. Patient tolerated procedure well.  Assessment & Plan:   1. Type 2 diabetes mellitus with stage 3b chronic kidney disease, without long-term current use of insulin ([place]) Due for quarterly labs. Continue metformin 500 mg, two tabs twice a day.  - Comprehensive metabolic panel - Hemoglobin A1c  2. Essential hypertension Blood pressure is acceptable. Continue amlodipine 10 mg daily and lisinopril 20 mg daily.  - EKG 12-Lead - Comprehensive metabolic panel - Ambulatory referral to Cardiology  3. Dyspnea on exertion Mr. Feutz is having dyspnea with some possible orthopnea and PND. He has chronic edema of the lower legs. I will check some screening labs, but plan referral to cardiology for assessment and possible echocardiogram.  - Comprehensive metabolic panel - CBC - TSH - Ambulatory referral to Cardiology  4. First degree heart block Possibly an indication of strain related to his new issues with DOE.  - Ambulatory referral to Cardiology  5. Impacted cerumen  of left ear Wax removed as noted above.   Return in about 3 months (around 07/09/2022) for Reassessment.   Haydee Salter, MD

## 2022-04-15 ENCOUNTER — Encounter: Payer: Self-pay | Admitting: Cardiology

## 2022-04-15 DIAGNOSIS — R0602 Shortness of breath: Secondary | ICD-10-CM | POA: Insufficient documentation

## 2022-04-15 NOTE — Progress Notes (Unsigned)
Cardiology Office Note   Date:  04/16/2022   ID:  Nathan Gomez, DOB 1941-07-30, MRN [id number]  PCP:  Nathan Salter, MD  Cardiologist:   None Referring:  Nathan Salter, MD  Chief Complaint  Patient presents with   Shortness of Breath      History of Present Illness: Nathan Gomez is a 81 y.o. male who presents for evaluation of shortness of breath.  He has been noticing this going up the stairs.  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  He has not had any prior cardiac history.  Years ago he said he had a stress test.  He has been getting short of breath doing things like climbing up the stairs.  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  He does not describe resting shortness of breath, PND or orthopnea.  He does not have any palpitations, presyncope or syncope.  He is not describing chest pressure, neck or arm discomfort.  He does have increasing fatigue and low energy.  He has had decreasing exercise tolerance he thinks.  I do see from an old CT that he had some calcium in his aorta and his coronaries noted.    Past Medical History:  Diagnosis Date   Achilles tendinitis    Allergy    Cataract    Chronic kidney disease    Complication of anesthesia    urinary retention   Degenerative tear of left medial meniscus 11/2014   Diabetes mellitus, type 2 (HCC)    GERD (gastroesophageal reflux disease)    Glaucoma    Hyperlipidemia    Hypertension    Osteoarthritis    right knee   Pancreatic insufficiency    Personal history of colonic adenomas 01/16/2013   Plantar fasciitis    Prostate cancer [facility])     Past Surgical History:  Procedure Laterality Date   CATARACT EXTRACTION  08/2012 L, 09/2012 R   COLONOSCOPY     PROSTATE BIOPSY     TONSILLECTOMY  1945    TOTAL HIP ARTHROPLASTY Left 2011   TOTAL HIP ARTHROPLASTY Right 07/09/2015   Procedure: TOTAL HIP ARTHROPLASTY ANTERIOR APPROACH;  Surgeon: Melrose Nakayama, MD;  Location: [facility];  Service: Orthopedics;  Laterality: Right;   TOTAL KNEE ARTHROPLASTY Right 07/21/2016   Procedure: RIGHT TOTAL KNEE ARTHROPLASTY;  Surgeon: Melrose Nakayama, MD;  Location: [facility];  Service: Orthopedics;  Laterality: Right;     Current Outpatient Medications  Medication Sig Dispense Refill   amLODipine (NORVASC) 10 MG tablet TAKE 1 TABLET DAILY 90 tablet 3   atorvastatin (LIPITOR) 40 MG tablet Take 40 mg by mouth daily.     blood glucose meter kit and supplies KIT Use to test blood sugar up to twice a day. DX E11.09 1 each 0   Cholecalciferol (VITAMIN D) 2000 units CAPS Take 1 capsule (2,000 Units total) by mouth every morning. 90 capsule 3   Continuous Blood Gluc Receiver (FREESTYLE LIBRE 14 DAY READER) DEVI 1 Device by Does not apply route as directed. 1 each 2   Cyanocobalamin (VITAMIN B12 PO) Take 1 tablet by mouth daily.     fluticasone (FLONASE) 50 MCG/ACT nasal spray Place 1 spray into both nostrils 2 (two) times daily.     glucose blood test  strip Use as instructed to test blood sugar up to twice a day. DX:  E11.09 200 each 3   Krill Oil 300 MG CAPS Take 500 mg by mouth daily.      Lancets 10X MISC Delica Fine Lancets. Use to test blood sugar up to twice a day. DX E11.09 200 each 3   lipase/protease/amylase (CREON) 36000 UNITS CPEP capsule Take by mouth.     lisinopril (ZESTRIL) 20 MG tablet TAKE 1 TABLET DAILY 90 tablet 3   Magnesium 100 MG CAPS Take 100 mg by mouth 2 (two) times daily.     metFORMIN (GLUCOPHAGE-XR) 500 MG 24 hr tablet TAKE 2 TABLETS 2 TIMES     DAILY WITH MEALS 360 tablet 1   Multiple Vitamins-Minerals (SENIOR MULTIVITAMIN PLUS) TABS Take 1 tablet by mouth daily.     tamsulosin (FLOMAX) 0.4 MG CAPS capsule Take 1 capsule (0.4 mg total) by mouth at bedtime. (Patient taking differently: Take  0.4-0.8 mg by mouth See admin instructions. Take 0.4 mg by mouth daily. Starting 7 days before procedure take 0.25m by mouth daily) 14 capsule 0   No current facility-administered medications for this visit.    Allergies:   Nsaids, Sulfa antibiotics, and Thiazide-type diuretics    Social History:  The patient  reports that he quit smoking about 35 years ago. His smoking use included cigarettes. He has a 20.00 pack-year smoking history. He has never used smokeless tobacco. He reports current alcohol use of about 1.0 - 2.0 standard drink per week. He reports that he does not use drugs.   Family History:  The patient's family history includes Brain cancer in his cousin; Cancer - Ovarian in his mother; Diabetes in his maternal grandfather; Heart disease in his paternal uncle; Heart disease (age of onset: 534 in his father; Hypertension in his father; Ovarian cancer in his sister; Pneumonia in his paternal grandfather.    ROS:  Please see the history of present illness.   Otherwise, review of systems are positive for none.   All other systems are reviewed and negative.    PHYSICAL EXAM: VS:  BP 119/72   Pulse 72   Ht 6' (1.829 m)   Wt 194 lb 12.8 oz (88.4 kg)   SpO2 97%   BMI 26.42 kg/m  , BMI Body mass index is 26.42 kg/m. GENERAL:  Well appearing HEENT:  Pupils equal round and reactive, fundi not visualized, oral mucosa unremarkable NECK:  No jugular venous distention, waveform within normal limits, carotid upstroke brisk and symmetric, no bruits, no thyromegaly LYMPHATICS:  No cervical, inguinal adenopathy LUNGS:  Clear to auscultation bilaterally BACK:  No CVA tenderness CHEST:  Unremarkable HEART:  PMI not displaced or sustained,S1 and S2 within normal limits, no S3, no S4, no clicks, no rubs, no murmurs ABD:  Flat, positive bowel sounds normal in frequency in pitch, no bruits, no rebound, no guarding, no midline pulsatile mass, no hepatomegaly, no splenomegaly EXT:  2 plus pulses  throughout, no edema, no cyanosis no clubbing SKIN:  No rashes no nodules NEURO:  Cranial nerves II through XII grossly intact, motor grossly intact throughout PSYCH:  Cognitively intact, oriented to person place and time    EKG:  EKG is not ordered today. The ekg ordered 04/08/2022 demonstrates sinus rhythm, first-degree AV block borderline, no acute ST-T wave changes.   Recent Labs: 07/01/2021: Magnesium 1.3 04/08/2022: ALT 12; BUN 26; Creatinine, Ser 1.50; Hemoglobin 11.7; Platelets 225.0; Potassium 4.7; Sodium 138; TSH 3.11  Lipid Panel    Component Value Date/Time   CHOL 113 07/01/2021 0931   CHOL 151 07/05/2019 0916   TRIG 108.0 07/01/2021 0931   TRIG 107 09/26/2010 0000   HDL 37.40 (L) 07/01/2021 0931   HDL 40 07/05/2019 0916   CHOLHDL 3 07/01/2021 0931   VLDL 21.6 07/01/2021 0931   LDLCALC 54 07/01/2021 0931   LDLCALC 79 07/05/2019 0916   LDLDIRECT 99.0 05/01/2020 0840      Wt Readings from Last 3 Encounters:  04/16/22 194 lb 12.8 oz (88.4 kg)  04/08/22 192 lb 3.2 oz (87.2 kg)  01/06/22 191 lb 6.4 oz (86.8 kg)      Other studies Reviewed: Additional studies/ records that were reviewed today include: Labs. Review of the above records demonstrates:  Please see elsewhere in the note.     ASSESSMENT AND PLAN:  SOB: The patient does have known coronary calcium.  He has significant cardiovascular risk factors.  Dyspnea could be an anginal equivalent.  He would be able to walk on a treadmill.  I would like for him to have a PET scan.  Further evaluation will be based on these results.  I will also check a BNP level.  If these studies are unremarkable I would not suggest further cardiac work-up  First degree AV block: He has borderline PR prolongation and I do not think this is clinically significant.  HTN: Blood pressures controlled.  Continue the meds as listed.  DM: A1c is 7.1.  No change in therapy.  Plan per Nathan Salter, MD  Current medicines are  reviewed at length with the patient today.  The patient does not have concerns regarding medicines.  The following changes have been made:  no change  Labs/ tests ordered today include:   Orders Placed This Encounter  Procedures   NM PET CT CARDIAC PERFUSION MULTI W/ABSOLUTE BLOODFLOW   Pro b natriuretic peptide (BNP)[id number])     Disposition:   FU with me as needed and based on the results of the above   Signed, Minus Breeding, MD  04/16/2022 12:47 PM    [place]

## 2022-04-16 ENCOUNTER — Ambulatory Visit (INDEPENDENT_AMBULATORY_CARE_PROVIDER_SITE_OTHER): Payer: Medicare Other | Admitting: Cardiology

## 2022-04-16 ENCOUNTER — Encounter: Payer: Self-pay | Admitting: Cardiology

## 2022-04-16 VITALS — BP 119/72 | HR 72 | Ht 72.0 in | Wt 194.8 lb

## 2022-04-16 DIAGNOSIS — I440 Atrioventricular block, first degree: Secondary | ICD-10-CM | POA: Diagnosis not present

## 2022-04-16 DIAGNOSIS — E118 Type 2 diabetes mellitus with unspecified complications: Secondary | ICD-10-CM | POA: Diagnosis not present

## 2022-04-16 DIAGNOSIS — R0602 Shortness of breath: Secondary | ICD-10-CM | POA: Diagnosis not present

## 2022-04-16 DIAGNOSIS — I10 Essential (primary) hypertension: Secondary | ICD-10-CM | POA: Diagnosis not present

## 2022-04-16 NOTE — Patient Instructions (Signed)
Testing/Procedures:  How to Prepare for Your Cardiac PET/CT Stress Test:  1. Please do not take these medications before your test:   Medications that may interfere with the cardiac pharmacological stress agent (ex. nitrates or beta-blockers) the day of the exam. Theophylline containing medications for 12 hours. Dipyridamole 48 hours prior to the test. Your remaining medications may be taken with water.  2. Nothing to eat or drink, except water, 3 hours prior to arrival time.   NO caffeine/decaffeinated products, or chocolate 12 hours prior to arrival.  3. NO perfume, cologne or lotion  4. Total time is 1 to 2 hours; you may want to bring reading material for the waiting time.  5. Please report to Admitting at the [facility] Main Entrance 60 minutes early for your test.  [place], [place] [address]  Diabetic Preparation:  Hold oral medications. You may take NPH and Lantus insulin. Do not take Humalog or Humulin R (Regular Insulin) the day of your test. Check blood sugars prior to leaving the house. If able to eat breakfast prior to 3 hour fasting, you may take all medications, including your insulin, Do not worry if you miss your breakfast dose of insulin - start at your next meal.  IF YOU THINK YOU MAY BE PREGNANT, OR ARE NURSING PLEASE INFORM THE TECHNOLOGIST.  In preparation for your appointment, medication and supplies will be purchased.  Appointment availability is limited, so if you need to cancel or reschedule, please call the Radiology Department at [phone]  24 hours in advance to avoid a cancellation fee of $100.00  What to Expect After you Arrive:  Once you arrive and check in for your appointment, you will be taken to a preparation room within the Radiology Department.  A technologist or Nurse will obtain your medical history, verify that you are correctly prepped for the exam, and explain the procedure.  Afterwards,  an IV will be  started in your arm and electrodes will be placed on your skin for EKG monitoring during the stress portion of the exam. Then you will be escorted to the PET/CT scanner.  There, staff will get you positioned on the scanner and obtain a blood pressure and EKG.  During the exam, you will continue to be connected to the EKG and blood pressure machines.  A small, safe amount of a radioactive tracer will be injected in your IV to obtain a series of pictures of your heart along with an injection of a stress agent.    After your Exam:  It is recommended that you eat a meal and drink a caffeinated beverage to counter act any effects of the stress agent.  Drink plenty of fluids for the remainder of the day and urinate frequently for the first couple of hours after the exam.  Your doctor will inform you of your test results within 7-10 business days.  For questions about your test or how to prepare for your test, please call: Marchia Bond, Cardiac Imaging Nurse Navigator  Gordy Clement, Cardiac Imaging Nurse Navigator Office: [phone]    Follow-Up: At [facility], you and your health needs are our priority.  As part of our continuing mission to provide you with exceptional heart care, we have created designated Provider Care Teams.  These Care Teams include your primary Cardiologist (physician) and Advanced Practice Providers (APPs -  Physician Assistants and Nurse Practitioners) who all work together to provide you with the care you need, when you  need it.  We recommend signing up for the patient portal called "MyChart".  Sign up information is provided on this After Visit Summary.  MyChart is used to connect with patients for Virtual Visits (Telemedicine).  Patients are able to view lab/test results, encounter notes, upcoming appointments, etc.  Non-urgent messages can be sent to your provider as well.   To learn more about what you can do with MyChart, go to [email or web address].    Your next  appointment:    As needed  Important Information About Sugar

## 2022-04-17 LAB — PRO B NATRIURETIC PEPTIDE: NT-Pro BNP: 47 pg/mL (ref 0–486)

## 2022-04-30 ENCOUNTER — Encounter: Payer: Self-pay | Admitting: Cardiology

## 2022-05-20 ENCOUNTER — Encounter: Payer: Self-pay | Admitting: Cardiology

## 2022-05-22 ENCOUNTER — Other Ambulatory Visit: Payer: Self-pay | Admitting: Family Medicine

## 2022-06-08 DIAGNOSIS — C61 Malignant neoplasm of prostate: Secondary | ICD-10-CM | POA: Diagnosis not present

## 2022-06-14 ENCOUNTER — Other Ambulatory Visit: Payer: Self-pay | Admitting: Family Medicine

## 2022-06-14 DIAGNOSIS — E1129 Type 2 diabetes mellitus with other diabetic kidney complication: Secondary | ICD-10-CM

## 2022-06-17 DIAGNOSIS — R3914 Feeling of incomplete bladder emptying: Secondary | ICD-10-CM | POA: Diagnosis not present

## 2022-06-17 DIAGNOSIS — R351 Nocturia: Secondary | ICD-10-CM | POA: Diagnosis not present

## 2022-06-17 DIAGNOSIS — N401 Enlarged prostate with lower urinary tract symptoms: Secondary | ICD-10-CM | POA: Diagnosis not present

## 2022-06-17 DIAGNOSIS — C61 Malignant neoplasm of prostate: Secondary | ICD-10-CM | POA: Diagnosis not present

## 2022-07-08 ENCOUNTER — Ambulatory Visit (INDEPENDENT_AMBULATORY_CARE_PROVIDER_SITE_OTHER): Payer: Medicare Other

## 2022-07-08 DIAGNOSIS — Z0000 Encounter for general adult medical examination without abnormal findings: Secondary | ICD-10-CM

## 2022-07-08 DIAGNOSIS — M1712 Unilateral primary osteoarthritis, left knee: Secondary | ICD-10-CM | POA: Diagnosis not present

## 2022-07-08 NOTE — Patient Instructions (Signed)
Nathan Gomez , Thank you for taking time to come for your Medicare Wellness Visit. I appreciate your ongoing commitment to your health goals. Please review the following plan we discussed and let me know if I can assist you in the future.   Screening recommendations/referrals: Colonoscopy: no longer required  Recommended yearly ophthalmology/optometry visit for glaucoma screening and checkup Recommended yearly dental visit for hygiene and checkup  Vaccinations: Influenza vaccine: completed  Pneumococcal vaccine: completed  Tdap vaccine: due  Shingles vaccine: completed     Advanced directives: yes   Conditions/risks identified: none   Next appointment: none   Preventive Care 83 Years and Older, Male Preventive care refers to lifestyle choices and visits with your health care provider that can promote health and wellness. What does preventive care include? A yearly physical exam. This is also called an annual well check. Dental exams once or twice a year. Routine eye exams. Ask your health care provider how often you should have your eyes checked. Personal lifestyle choices, including: Daily care of your teeth and gums. Regular physical activity. Eating a healthy diet. Avoiding tobacco and drug use. Limiting alcohol use. Practicing safe sex. Taking low doses of aspirin every day. Taking vitamin and mineral supplements as recommended by your health care provider. What happens during an annual well check? The services and screenings done by your health care provider during your annual well check will depend on your age, overall health, lifestyle risk factors, and family history of disease. Counseling  Your health care provider may ask you questions about your: Alcohol use. Tobacco use. Drug use. Emotional well-being. Home and relationship well-being. Sexual activity. Eating habits. History of falls. Memory and ability to understand (cognition). Work and work  Statistician. Screening  You may have the following tests or measurements: Height, weight, and BMI. Blood pressure. Lipid and cholesterol levels. These may be checked every 5 years, or more frequently if you are over 83 years old. Skin check. Lung cancer screening. You may have this screening every year starting at age 83 if you have a 30-pack-year history of smoking and currently smoke or have quit within the past 15 years. Fecal occult blood test (FOBT) of the stool. You may have this test every year starting at age 83. Flexible sigmoidoscopy or colonoscopy. You may have a sigmoidoscopy every 5 years or a colonoscopy every 10 years starting at age 83. Prostate cancer screening. Recommendations will vary depending on your family history and other risks. Hepatitis C blood test. Hepatitis B blood test. Sexually transmitted disease (STD) testing. Diabetes screening. This is done by checking your blood sugar (glucose) after you have not eaten for a while (fasting). You may have this done every 1-3 years. Abdominal aortic aneurysm (AAA) screening. You may need this if you are a current or former smoker. Osteoporosis. You may be screened starting at age 83 if you are at high risk. Talk with your health care provider about your test results, treatment options, and if necessary, the need for more tests. Vaccines  Your health care provider may recommend certain vaccines, such as: Influenza vaccine. This is recommended every year. Tetanus, diphtheria, and acellular pertussis (Tdap, Td) vaccine. You may need a Td booster every 10 years. Zoster vaccine. You may need this after age 83. Pneumococcal 13-valent conjugate (PCV13) vaccine. One dose is recommended after age 83. Pneumococcal polysaccharide (PPSV23) vaccine. One dose is recommended after age 83. Talk to your health care provider about which screenings and vaccines you need and  how often you need them. This information is not intended to replace  advice given to you by your health care provider. Make sure you discuss any questions you have with your health care provider. Document Released: 11/29/2015 Document Revised: 07/22/2016 Document Reviewed: 09/03/2015 Elsevier Interactive Patient Education  2017 [place] Prevention in the Home Falls can cause injuries. They can happen to people of all ages. There are many things you can do to make your home safe and to help prevent falls. What can I do on the outside of my home? Regularly fix the edges of walkways and driveways and fix any cracks. Remove anything that might make you trip as you walk through a door, such as a raised step or threshold. Trim any bushes or trees on the path to your home. Use bright outdoor lighting. Clear any walking paths of anything that might make someone trip, such as rocks or tools. Regularly check to see if handrails are loose or broken. Make sure that both sides of any steps have handrails. Any raised decks and porches should have guardrails on the edges. Have any leaves, snow, or ice cleared regularly. Use sand or salt on walking paths during winter. Clean up any spills in your garage right away. This includes oil or grease spills. What can I do in the bathroom? Use night lights. Install grab bars by the toilet and in the tub and shower. Do not use towel bars as grab bars. Use non-skid mats or decals in the tub or shower. If you need to sit down in the shower, use a plastic, non-slip stool. Keep the floor dry. Clean up any water that spills on the floor as soon as it happens. Remove soap buildup in the tub or shower regularly. Attach bath mats securely with double-sided non-slip rug tape. Do not have throw rugs and other things on the floor that can make you trip. What can I do in the bedroom? Use night lights. Make sure that you have a light by your bed that is easy to reach. Do not use any sheets or blankets that are too big for your bed.  They should not hang down onto the floor. Have a firm chair that has side arms. You can use this for support while you get dressed. Do not have throw rugs and other things on the floor that can make you trip. What can I do in the kitchen? Clean up any spills right away. Avoid walking on wet floors. Keep items that you use a lot in easy-to-reach places. If you need to reach something above you, use a strong step stool that has a grab bar. Keep electrical cords out of the way. Do not use floor polish or wax that makes floors slippery. If you must use wax, use non-skid floor wax. Do not have throw rugs and other things on the floor that can make you trip. What can I do with my stairs? Do not leave any items on the stairs. Make sure that there are handrails on both sides of the stairs and use them. Fix handrails that are broken or loose. Make sure that handrails are as long as the stairways. Check any carpeting to make sure that it is firmly attached to the stairs. Fix any carpet that is loose or worn. Avoid having throw rugs at the top or bottom of the stairs. If you do have throw rugs, attach them to the floor with carpet tape. Make sure that you have  a light switch at the top of the stairs and the bottom of the stairs. If you do not have them, ask someone to add them for you. What else can I do to help prevent falls? Wear shoes that: Do not have high heels. Have rubber bottoms. Are comfortable and fit you well. Are closed at the toe. Do not wear sandals. If you use a stepladder: Make sure that it is fully opened. Do not climb a closed stepladder. Make sure that both sides of the stepladder are locked into place. Ask someone to hold it for you, if possible. Clearly mark and make sure that you can see: Any grab bars or handrails. First and last steps. Where the edge of each step is. Use tools that help you move around (mobility aids) if they are needed. These  include: Canes. Walkers. Scooters. Crutches. Turn on the lights when you go into a dark area. Replace any light bulbs as soon as they burn out. Set up your furniture so you have a clear path. Avoid moving your furniture around. If any of your floors are uneven, fix them. If there are any pets around you, be aware of where they are. Review your medicines with your doctor. Some medicines can make you feel dizzy. This can increase your chance of falling. Ask your doctor what other things that you can do to help prevent falls. This information is not intended to replace advice given to you by your health care provider. Make sure you discuss any questions you have with your health care provider. Document Released: 08/29/2009 Document Revised: 04/09/2016 Document Reviewed: 12/07/2014 Elsevier Interactive Patient Education  2017 [employer].

## 2022-07-08 NOTE — Progress Notes (Signed)
Subjective:   Nathan Gomez is a 81 y.o. male who presents for an Subsequent Medicare Annual Wellness Visit.   I connected with Nathan Gomez  today by telephone and verified that I am speaking with the correct person using two identifiers. Location patient: home Location provider: work Persons participating in the virtual visit: patient, provider.   I discussed the limitations, risks, security and privacy concerns of performing an evaluation and management service by telephone and the availability of in person appointments. I also discussed with the patient that there may be a patient responsible charge related to this service. The patient expressed understanding and verbally consented to this telephonic visit.    Interactive audio and video telecommunications were attempted between this provider and patient, however failed, due to patient having technical difficulties OR patient did not have access to video capability.  We continued and completed visit with audio only.    Review of Systems     Cardiac Risk Factors include: advanced age (>69mn, >>19women);diabetes mellitus;male gender     Objective:    Today's Vitals   There is no height or weight on file to calculate BMI.     07/08/2022   11:39 AM 07/14/2021    9:57 AM 06/17/2021    8:25 AM 03/18/2021   10:23 AM 06/05/2020    9:54 AM 05/13/2017    1:29 PM 04/08/2017    1:29 PM  Advanced Directives  Does Patient Have a Medical Advance Directive? Yes Yes Yes Yes Yes Yes Yes  Type of AParamedicof [place]villeLiving will  [place]Living will [place]ahumpkaLiving will HSt. GeorgeLiving will  HVinelandLiving will  Does patient want to make changes to medical advance directive?  No - Patient declined  No - Patient declined     Copy of [place] Chart? No - copy requested  No - copy requested No - copy requested Yes -  validated most recent copy scanned in chart (See row information)      Current Medications (verified) Outpatient Encounter Medications as of 07/08/2022  Medication Sig   amLODipine (NORVASC) 10 MG tablet TAKE 1 TABLET DAILY   atorvastatin (LIPITOR) 40 MG tablet TAKE 1 TABLET DAILY   blood glucose meter kit and supplies KIT Use to test blood sugar up to twice a day. DX E11.09   Cholecalciferol (VITAMIN D) 2000 units CAPS Take 1 capsule (2,000 Units total) by mouth every morning.   Continuous Blood Gluc Receiver (FREESTYLE LIBRE 14 DAY READER) DEVI 1 Device by Does not apply route as directed.   Cyanocobalamin (VITAMIN B12 PO) Take 1 tablet by mouth daily.   fluticasone (FLONASE) 50 MCG/ACT nasal spray Place 1 spray into both nostrils 2 (two) times daily.   glucose blood test strip Use as instructed to test blood sugar up to twice a day. DX:  E11.09   Krill Oil 300 MG CAPS Take 500 mg by mouth daily.    Lancets 379KMISC Delica Fine Lancets. Use to test blood sugar up to twice a day. DX E11.09   lipase/protease/amylase (CREON) 36000 UNITS CPEP capsule Take by mouth.   lisinopril (ZESTRIL) 20 MG tablet TAKE 1 TABLET DAILY   Magnesium 100 MG CAPS Take 100 mg by mouth 2 (two) times daily.   metFORMIN (GLUCOPHAGE-XR) 500 MG 24 hr tablet TAKE 2 TABLETS 2 TIMES     DAILY WITH MEALS   Multiple Vitamins-Minerals (SENIOR  MULTIVITAMIN PLUS) TABS Take 1 tablet by mouth daily.   tamsulosin (FLOMAX) 0.4 MG CAPS capsule Take 1 capsule (0.4 mg total) by mouth at bedtime. (Patient taking differently: Take 0.4-0.8 mg by mouth See admin instructions. Take 0.4 mg by mouth daily. Starting 7 days before procedure take 0.49m by mouth daily)   No facility-administered encounter medications on file as of 07/08/2022.    Allergies (verified) Nsaids, Sulfa antibiotics, and Thiazide-type diuretics   History: Past Medical History:  Diagnosis Date   Achilles tendinitis    Allergy    Cataract    Chronic kidney  disease    Complication of anesthesia    urinary retention   Degenerative tear of left medial meniscus 11/2014   Diabetes mellitus, type 2 (HCC)    GERD (gastroesophageal reflux disease)    Glaucoma    Hyperlipidemia    Hypertension    Osteoarthritis    right knee   Pancreatic insufficiency    Personal history of colonic adenomas 01/16/2013   Plantar fasciitis    Prostate cancer ([facility]    Past Surgical History:  Procedure Laterality Date   CATARACT EXTRACTION  08/2012 L, 09/2012 R   COLONOSCOPY     PROSTATE BIOPSY     TONSILLECTOMY  1945   TOTAL HIP ARTHROPLASTY Left 2011   TOTAL HIP ARTHROPLASTY Right 07/09/2015   Procedure: TOTAL HIP ARTHROPLASTY ANTERIOR APPROACH;  Surgeon: PMelrose Nakayama MD;  [place]  Service: Orthopedics;  Laterality: Right;   TOTAL KNEE ARTHROPLASTY Right 07/21/2016   Procedure: RIGHT TOTAL KNEE ARTHROPLASTY;  Surgeon: PMelrose Nakayama MD;  [place]  Service: Orthopedics;  Laterality: Right;   Family History  Problem Relation Age of Onset   Cancer - Ovarian Mother    Heart disease Father 534      Died age 83  Hypertension Father    Ovarian cancer Sister    Diabetes Maternal Grandfather    Pneumonia Paternal Grandfather    Heart disease Paternal Uncle    Brain cancer Cousin    Colon cancer Neg Hx    Esophageal cancer Neg Hx    Liver cancer Neg Hx    Pancreatic cancer Neg Hx    Rectal cancer Neg Hx    Stomach cancer Neg Hx    Social History   Socioeconomic History   Marital status: Married    Spouse name: Not on file   Number of children: 3   Years of education: Not on file   Highest education level: Not on file  Occupational History   Occupation: Retired    EFish farm manager [employer]   Comment: PBuilding services engineer Tobacco Use   Smoking status: Former    Packs/day: 1.00    Years: 20.00    Total pack years: 20.00    Types: Cigarettes    Quit date: 11/16/1986    Years since quitting: 35.6   Smokeless tobacco: Never  Vaping Use    Vaping Use: Never used  Substance and Sexual Activity   Alcohol use: Yes    Alcohol/week: 1.0 - 2.0 standard drink of alcohol    Types: 1 - 2 Glasses of wine per week    Comment: occ   Drug use: No   Sexual activity: Yes  Other Topics Concern   Not on file  Social History Narrative   W[place] is from [place]    [place] years in [employer] [place]   Three children   Five grands.  Social Determinants of Health   Financial Resource Strain: Low Risk  (07/08/2022)   Overall Financial Resource Strain (CARDIA)    Difficulty of Paying Living Expenses: Not hard at all  Food Insecurity: No Food Insecurity (07/08/2022)   Hunger Vital Sign    Worried About Running Out of Food in the Last Year: Never true    Ran Out of Food in the Last Year: Never true  Transportation Needs: No Transportation Needs (07/08/2022)   PRAPARE - Hydrologist (Medical): No    Lack of Transportation (Non-Medical): No  Physical Activity: Insufficiently Active (07/08/2022)   Exercise Vital Sign    Days of Exercise per Week: 3 days    Minutes of Exercise per Session: 30 min  Stress: No Stress Concern Present (07/08/2022)   [place]    Feeling of Stress : Not at all  Social Connections: Moderately Integrated (07/08/2022)   Social Connection and Isolation Panel [NHANES]    Frequency of Communication with Friends and Family: Three times a week    Frequency of Social Gatherings with Friends and Family: Three times a week    Attends Religious Services: More than 4 times per year    Active Member of Clubs or Organizations: No    Attends Archivist Meetings: Never    Marital Status: Married    Tobacco Counseling Counseling given: Not Answered   Clinical Intake:  Pre-visit preparation completed: Yes  Pain : No/denies pain     Nutritional Risks: None Diabetes: Yes CBG done?: No Did pt. bring in CBG  monitor from home?: No  How often do you need to have someone help you when you read instructions, pamphlets, or other written materials from your doctor or pharmacy?: 1 - Never What is the last grade level you completed in school?: college  Diabetic?yes   Nutrition Risk Assessment:  Has the patient had any N/V/D within the last 2 months?  No  Does the patient have any non-healing wounds?  No  Has the patient had any unintentional weight loss or weight gain?  No   Diabetes:  Is the patient diabetic?  Yes  If diabetic, was a CBG obtained today?  No  Did the patient bring in their glucometer from home?  No  How often do you monitor your CBG's? Daily .   Financial Strains and Diabetes Management:  Are you having any financial strains with the device, your supplies or your medication? No .  Does the patient want to be seen by Chronic Care Management for management of their diabetes?  No  Would the patient like to be referred to a Nutritionist or for Diabetic Management?  No   Diabetic Exams:  Diabetic Eye Exam: Completed 01/2022 Diabetic Foot Exam: Overdue, Pt has been advised about the importance in completing this exam. Pt is scheduled for diabetic foot exam on next office visit .   Interpreter Needed?: No  Information entered by :: L.Wilson,LPN   Activities of Daily Living    07/08/2022   11:40 AM  In your present state of health, do you have any difficulty performing the following activities:  Hearing? 0  Vision? 0  Difficulty concentrating or making decisions? 0  Walking or climbing stairs? 0  Dressing or bathing? 0  Doing errands, shopping? 0  Preparing Food and eating ? N  Using the Toilet? N  In the past six months, have you accidently leaked urine?  N  Do you have problems with loss of bowel control? N  Managing your Medications? N  Managing your Finances? N  Housekeeping or managing your Housekeeping? N    Patient Care Team: Haydee Salter, MD as PCP -  General (Family Medicine) Darleen Crocker, MD (Ophthalmology) Melrose Nakayama, MD (Orthopedic Surgery) Katy Apo, MD as Consulting Physician (Ophthalmology) Lavonna Monarch, MD as Consulting Physician (Dermatology) Robley Fries, MD as Consulting Physician (Urology) Ronald Lobo, MD as Consulting Physician (Gastroenterology)  Indicate any recent Medical Services you may have received from other than [facility] providers in the past year (date may be approximate).     Assessment:   This is a routine wellness examination for [place].  Hearing/Vision screen Vision Screening - Comments:: Annual eye exams wear glasses n  Dietary issues and exercise activities discussed: Current Exercise Habits: Home exercise routine, Type of exercise: walking, Time (Minutes): 30, Frequency (Times/Week): 3, Weekly Exercise (Minutes/Week): 90, Intensity: Mild, Exercise limited by: None identified   Goals Addressed             This Visit's Progress    Patient Stated   On track    Increase walking and lose weight        Depression Screen    07/08/2022   11:40 AM 01/06/2022    9:05 AM 08/29/2021    3:36 PM 06/17/2021    8:33 AM 12/30/2020   10:13 AM 06/05/2020    9:57 AM 05/01/2020    8:50 AM  PHQ 2/9 Scores  PHQ - 2 Score 0 0 0 0 1 1 1   PHQ- 9 Score       2    Fall Risk    07/08/2022   11:40 AM 01/06/2022    9:05 AM 06/17/2021    8:29 AM 12/30/2020   10:14 AM 06/05/2020    9:56 AM  Fall Risk   Falls in the past year? 0 0 0 0 0  Number falls in past yr: 0 0 0  0  Injury with Fall? 0 0 0  0  Risk for fall due to :  No Fall Risks     Follow up Falls evaluation completed;Education provided Falls evaluation completed Falls evaluation completed;Falls prevention discussed  Falls prevention discussed    FALL RISK PREVENTION PERTAINING TO THE HOME:  Any stairs in or around the home? Yes  If so, are there any without handrails? No  Home free of loose throw rugs in walkways, pet beds, electrical  cords, etc? Yes  Adequate lighting in your home to reduce risk of falls? Yes   ASSISTIVE DEVICES UTILIZED TO PREVENT FALLS:  Life alert? No  Use of a cane, walker or w/c? No  Grab bars in the bathroom? No  Shower chair or bench in shower? Yes  Elevated toilet seat or a handicapped toilet? Yes    Cognitive Function:    Normal cognitive status assessed by  telephone conversation by this Nurse Health Advisor. No abnormalities found.      05/13/2017    1:35 PM  6CIT Screen  What Year? 0 points  What month? 0 points  What time? 0 points  Count back from 20 0 points  Months in reverse 0 points  Repeat phrase 10 points  Total Score 10 points    Immunizations Immunization History  Administered Date(s) Administered   Fluad Quad(high Dose 65+) 08/15/2019   Hepatitis A 09/15/1996   Influenza Split 09/28/2011   Influenza, High Dose Seasonal PF 07/12/2014,  07/30/2015, 08/25/2016, 09/13/2017, 08/12/2018   Influenza, Seasonal, Injecte, Preservative Fre 07/17/2013   Influenza-Unspecified 09/10/2021   Meningococcal Conjugate 09/15/1996   PFIZER(Purple Top)SARS-COV-2 Vaccination 12/23/2019, 01/17/2020, 08/13/2020   Pfizer Covid-19 Vaccine Bivalent Booster 70yr & up 03/10/2021   Pneumococcal Conjugate-13 01/29/2015   Pneumococcal Polysaccharide-23 08/01/2007, 09/16/2008   Td 05/26/2012   Tdap 04/16/2006   Typhoid Live 05/13/2017, 10/22/2017   Zoster Recombinat (Shingrix) 05/13/2017, 10/22/2017   Zoster, Live 03/01/2009    TDAP status: Due, Education has been provided regarding the importance of this vaccine. Advised may receive this vaccine at local pharmacy or Health Dept. Aware to provide a copy of the vaccination record if obtained from local pharmacy or Health Dept. Verbalized acceptance and understanding.  Flu Vaccine status: Up to date  Pneumococcal vaccine status: Up to date  Covid-19 vaccine status: Completed vaccines  Qualifies for Shingles Vaccine? Yes   Zostavax  completed Yes   Shingrix Completed?: Yes  Screening Tests Health Maintenance  Topic Date Due   COVID-19 Vaccine (5 - Pfizer risk series) 05/05/2021   TETANUS/TDAP  05/26/2022   INFLUENZA VACCINE  06/16/2022   FOOT EXAM  07/01/2022   OPHTHALMOLOGY EXAM  08/14/2022   HEMOGLOBIN A1C  10/09/2022   COLONOSCOPY (Pts 45-441yrInsurance coverage will need to be confirmed)  01/16/2026   Pneumonia Vaccine 6532Years old  Completed   Zoster Vaccines- Shingrix  Completed   HPV VACCINES  Aged Out    Health Maintenance  Health Maintenance Due  Topic Date Due   COVID-19 Vaccine (5 - Pfizer risk series) 05/05/2021   TETANUS/TDAP  05/26/2022   INFLUENZA VACCINE  06/16/2022   FOOT EXAM  07/01/2022    Colorectal cancer screening: No longer required.   Lung Cancer Screening: (Low Dose CT Chest recommended if Age 83-80ears, 30 pack-year currently smoking OR have quit w/in 15years.) does not qualify.   Lung Cancer Screening Referral: n/a  Additional Screening:  Hepatitis C Screening: does not qualify;   Vision Screening: Recommended annual ophthalmology exams for early detection of glaucoma and other disorders of the eye. Is the patient up to date with their annual eye exam?  Yes  Who is the provider or what is the name of the office in which the patient attends annual eye exams? Dr.Lyles  If pt is not established with a provider, would they like to be referred to a provider to establish care? No .   Dental Screening: Recommended annual dental exams for proper oral hygiene  Community Resource Referral / Chronic Care Management: CRR required this visit?  No   CCM required this visit?  No      Plan:     I have personally reviewed and noted the following in the patient's chart:   Medical and social history Use of alcohol, tobacco or illicit drugs  Current medications and supplements including opioid prescriptions. Patient is not currently taking opioid prescriptions. Functional  ability and status Nutritional status Physical activity Advanced directives List of other physicians Hospitalizations, surgeries, and ER visits in previous 12 months Vitals Screenings to include cognitive, depression, and falls Referrals and appointments  In addition, I have reviewed and discussed with patient certain preventive protocols, quality metrics, and best practice recommendations. A written personalized care plan for preventive services as well as general preventive health recommendations were provided to patient.     LaDaphane ShepherdLPN   06/16/49/5697 Nurse Notes: none

## 2022-07-09 ENCOUNTER — Ambulatory Visit (INDEPENDENT_AMBULATORY_CARE_PROVIDER_SITE_OTHER): Payer: Medicare Other | Admitting: Family Medicine

## 2022-07-09 ENCOUNTER — Encounter: Payer: Self-pay | Admitting: Family Medicine

## 2022-07-09 VITALS — BP 136/78 | HR 70 | Temp 97.6°F | Ht 72.0 in | Wt 200.6 lb

## 2022-07-09 DIAGNOSIS — E782 Mixed hyperlipidemia: Secondary | ICD-10-CM

## 2022-07-09 DIAGNOSIS — K580 Irritable bowel syndrome with diarrhea: Secondary | ICD-10-CM

## 2022-07-09 DIAGNOSIS — N1832 Chronic kidney disease, stage 3b: Secondary | ICD-10-CM | POA: Diagnosis not present

## 2022-07-09 DIAGNOSIS — I10 Essential (primary) hypertension: Secondary | ICD-10-CM | POA: Diagnosis not present

## 2022-07-09 DIAGNOSIS — K8689 Other specified diseases of pancreas: Secondary | ICD-10-CM

## 2022-07-09 DIAGNOSIS — E1122 Type 2 diabetes mellitus with diabetic chronic kidney disease: Secondary | ICD-10-CM | POA: Diagnosis not present

## 2022-07-09 DIAGNOSIS — R0602 Shortness of breath: Secondary | ICD-10-CM | POA: Diagnosis not present

## 2022-07-09 LAB — LIPID PANEL
Cholesterol: 153 mg/dL (ref 0–200)
HDL: 31.7 mg/dL — ABNORMAL LOW (ref >39.00)
LDL Cholesterol: 87 mg/dL (ref 0–99)
NonHDL: 120.95
Total CHOL/HDL Ratio: 5
Triglycerides: 171 mg/dL — ABNORMAL HIGH (ref 0.0–149.0)
VLDL: 34.2 mg/dL (ref 0.0–40.0)

## 2022-07-09 LAB — HEMOGLOBIN A1C: Hgb A1c MFr Bld: 7.4 % — ABNORMAL HIGH (ref 4.6–6.5)

## 2022-07-09 LAB — GLUCOSE, RANDOM: Glucose, Bld: 139 mg/dL — ABNORMAL HIGH (ref 70–99)

## 2022-07-09 NOTE — Progress Notes (Signed)
N[facility] [address] [place] [address] Dept: [phone] Dept Fax: [phone]  Chronic Care Office Visit  Subjective:    Patient ID: Nathan Gomez, male    DOB: March 16, 1941, 81 y.o..   MRN: [id number]  Chief Complaint  Patient presents with   Follow-up    3 month f/u.   Not fasting.     History of Present Illness:  Patient is in today for reassessment of chronic medical issues.  Mr. Mcmanaman has a history of Type 2 diabetes (managed with metformin 1,000 mg bid), hypertension (managed with lisinopril 20 mg daily and amlodipine 10 mg daily), and hyperlipidemia (managed with atorvastatin 40 mg daily). He has Stage 3b chronic kidney disease.    Mr. Puccinelli has a history of pancreatic insufficiency. He takes Creon and notes that it variably helpful. He also has IBS. He still occasionally gets some diarrhea.     Mr. Rufus notes a 1-2 month history of dyspnea with exertion. This mainly occurs when he is going up stairs. he has been seen by Dr. Percival Spanish (cardiology) who has him scheduled for a PET scan in 2 weeks. Mr. Cullinane believes he has been able to associate some fo the dyspnea symptoms with occurring just priro to his bouts of diarrhea.   Mr. Sheckler spent some time describing stressors he feels related to his wife's health issues and how she responds to him when he tries to get her to focus on this.  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/25/2021   Pancreatic insufficiency 03/31/2021   Irritable bowel syndrome with diarrhea 12/30/2020   Malignant neoplasm of prostate ([place]) 12/30/2020   Stage 3b  chronic kidney disease ([facility]) 05/01/2020   GERD (gastroesophageal reflux disease) 09/12/2017   Vitamin D deficiency 09/12/2017   Ruptured, tendon, Achilles, right, sequela 04/08/2017   Hypomagnesemia 04/08/2016   B12 deficiency 01/30/2016   Urinary retention 07/30/2015   Personal history of colonic adenomas 01/16/2013   Normocytic anemia 02/22/2012   Type 2 diabetes mellitus with stage 3b chronic kidney disease ([place]) 01/05/2011   Hyperlipidemia 01/05/2011   Essential hypertension 01/05/2011   Primary osteoarthritis of left knee 01/05/2011   Past Surgical History:  Procedure Laterality Date   CATARACT EXTRACTION  08/2012 L, 09/2012 R   COLONOSCOPY     PROSTATE BIOPSY     TONSILLECTOMY  1945   TOTAL HIP ARTHROPLASTY Left 2011   TOTAL HIP ARTHROPLASTY Right 07/09/2015   Procedure: TOTAL HIP ARTHROPLASTY ANTERIOR APPROACH;  Surgeon: Melrose Nakayama, MD;  Location: [facility];  Service: Orthopedics;  Laterality: Right;   TOTAL KNEE ARTHROPLASTY Right 07/21/2016   Procedure: RIGHT TOTAL KNEE ARTHROPLASTY;  Surgeon: Melrose Nakayama, MD;  Location: [facility];  Service: Orthopedics;  Laterality: Right;   Family History  Problem Relation Age of Onset   Cancer - Ovarian Mother    Heart disease Father 52       Died age 68   Hypertension Father    Ovarian cancer Sister    Diabetes Maternal Grandfather    Pneumonia Paternal Grandfather    Heart disease Paternal Uncle    Brain cancer Cousin  Colon cancer Neg Hx    Esophageal cancer Neg Hx    Liver cancer Neg Hx    Pancreatic cancer Neg Hx    Rectal cancer Neg Hx    Stomach cancer Neg Hx    Outpatient Medications Prior to Visit  Medication Sig Dispense Refill   amLODipine (NORVASC) 10 MG tablet TAKE 1 TABLET DAILY 90 tablet 3   atorvastatin (LIPITOR) 40 MG tablet TAKE 1 TABLET DAILY 90 tablet 3   blood glucose meter kit and supplies KIT Use to test blood sugar up to twice a day. DX E11.09 1 each 0   Cholecalciferol (VITAMIN D) 2000 units CAPS  Take 1 capsule (2,000 Units total) by mouth every morning. 90 capsule 3   Continuous Blood Gluc Receiver (FREESTYLE LIBRE 14 DAY READER) DEVI 1 Device by Does not apply route as directed. 1 each 2   Cyanocobalamin (VITAMIN B12 PO) Take 1 tablet by mouth daily.     fluticasone (FLONASE) 50 MCG/ACT nasal spray Place 1 spray into both nostrils 2 (two) times daily.     glucose blood test strip Use as instructed to test blood sugar up to twice a day. DX:  E11.09 200 each 3   Krill Oil 300 MG CAPS Take 500 mg by mouth daily.      Lancets 63J MISC Delica Fine Lancets. Use to test blood sugar up to twice a day. DX E11.09 200 each 3   lipase/protease/amylase (CREON) 36000 UNITS CPEP capsule Take by mouth.     lisinopril (ZESTRIL) 20 MG tablet TAKE 1 TABLET DAILY 90 tablet 3   Magnesium 100 MG CAPS Take 100 mg by mouth 2 (two) times daily.     metFORMIN (GLUCOPHAGE-XR) 500 MG 24 hr tablet TAKE 2 TABLETS 2 TIMES     DAILY WITH MEALS 360 tablet 1   Multiple Vitamins-Minerals (SENIOR MULTIVITAMIN PLUS) TABS Take 1 tablet by mouth daily.     tamsulosin (FLOMAX) 0.4 MG CAPS capsule Take 1 capsule (0.4 mg total) by mouth at bedtime. (Patient taking differently: Take 0.4-0.8 mg by mouth See admin instructions. Take 0.4 mg by mouth daily. Starting 7 days before procedure take 0.35m by mouth daily) 14 capsule 0   No facility-administered medications prior to visit.   Allergies  Allergen Reactions   Nsaids     Kidney Disease   Sulfa Antibiotics Other (See Comments)    As child    Not sure rxn   Thiazide-Type Diuretics Other (See Comments)    Unknown    Objective:   Today's Vitals   07/09/22 0925  BP: 136/78  Pulse: 70  Temp: 97.6 F (36.4 C)  TempSrc: Temporal  SpO2: 96%  Weight: 200 Gomez 9.6 oz (91 kg)  Height: 6' (1.829 m)   Body mass index is 27.21 kg/m.   General: Well developed, well nourished. No acute distress. Psych: Alert and oriented. Normal mood and affect.  Health Maintenance Due   Topic Date Due   TETANUS/TDAP  05/26/2022   INFLUENZA VACCINE  06/16/2022   FOOT EXAM  07/01/2022     Assessment & Plan:   1. Type 2 diabetes mellitus with stage 3b chronic kidney disease, without long-term current use of insulin ([place] Due for repeat A1c. Plan to continue metformin 1,000 mg bid.  - Glucose, random - Hemoglobin A1c  2. Essential hypertension Blood pressure is adequately controlled on lisinopril 20 mg daily  and amlodipine 10 mg daily.  3. Irritable bowel syndrome with diarrhea 4. Pancreatic  insufficiency Both conditions likely play a role in his intermittent diarrhea. We did discuss his association with diarrhea occurring soon after his episodes of perceived dyspnea. It is possible, he could be having a vagal response. This would be hard to pick up on routine evaluation. I would also consider that his metformin may play a role in the GI symptoms, so would consider switching his meds at some point if this persists.  5. SOB (shortness of breath) PET scan pending.  6. Mixed hyperlipidemia Due for annual lipid check. Plan to continue atorvastatin 40 mg daily.  - Lipid panel  Return in about 3 months (around 10/09/2022) for Reassessment.   Haydee Salter, MD

## 2022-07-10 ENCOUNTER — Encounter: Payer: Self-pay | Admitting: Family Medicine

## 2022-07-16 ENCOUNTER — Other Ambulatory Visit (HOSPITAL_COMMUNITY): Payer: Self-pay | Admitting: *Deleted

## 2022-07-16 ENCOUNTER — Other Ambulatory Visit: Payer: Self-pay | Admitting: *Deleted

## 2022-07-16 DIAGNOSIS — R0602 Shortness of breath: Secondary | ICD-10-CM

## 2022-07-17 ENCOUNTER — Telehealth (HOSPITAL_COMMUNITY): Payer: Self-pay | Admitting: *Deleted

## 2022-07-17 NOTE — Telephone Encounter (Signed)
Reaching out to patient to offer assistance regarding upcoming cardiac imaging study; pt verbalizes understanding of appt date/time, parking situation and where to check in, pre-test NPO status, and verified current allergies; name and call back number provided for further questions should they arise  Nathan Terhaar RN Navigator Cardiac Imaging [facility] [phone] office [phone] cell  Patient aware to avoid caffeine 12 hours prior to his cardiac PET scan. 

## 2022-07-21 ENCOUNTER — Ambulatory Visit (HOSPITAL_COMMUNITY)
Admission: RE | Admit: 2022-07-21 | Discharge: 2022-07-21 | Disposition: A | Discharge status: Home / Self Care | Payer: Medicare Other | Source: Ambulatory Visit | Attending: Cardiology | Admitting: Cardiology

## 2022-07-21 ENCOUNTER — Other Ambulatory Visit: Payer: Self-pay | Admitting: *Deleted

## 2022-07-21 DIAGNOSIS — R0602 Shortness of breath: Secondary | ICD-10-CM | POA: Insufficient documentation

## 2022-07-21 LAB — NM PET CT CARDIAC PERFUSION MULTI W/ABSOLUTE BLOODFLOW
LV dias vol: 91 mL (ref 62–150)
LV sys vol: 37 mL
MBFR: 1.71
Peak HR: 89 {beats}/min
Rest HR: 72 {beats}/min
Rest MBF: 1.07 ml/g/min
Rest Nuclear Isotope Dose: 23.7 mCi
Rest perfusion cavity size (mL): 91 mL
ST Depression (mm): 0 mm
Stress MBF: 1.83 ml/g/min
Stress Nuclear Isotope Dose: 23.7 mCi
Stress perfusion cavity size (mL): 95 mL
TID: 0.91

## 2022-07-21 MED ORDER — RUBIDIUM RB82 GENERATOR (RUBYFILL)
25.0000 | PACK | Freq: Once | INTRAVENOUS | Status: AC
  Administered 2022-07-21: 23.7 via INTRAVENOUS

## 2022-07-21 MED ORDER — REGADENOSON 0.4 MG/5ML IV SOLN: 0.4000 mg | Freq: Once | INTRAVENOUS | Status: AC

## 2022-07-21 MED ORDER — REGADENOSON 0.4 MG/5ML IV SOLN
INTRAVENOUS | Status: AC
  Administered 2022-07-21: 0.4 mg via INTRAVENOUS
  Filled 2022-07-21: qty 5

## 2022-07-21 NOTE — Progress Notes (Signed)
Patient presents for a cardiac PET stress test and tolerated procedure without incident. Patient maintained acceptable vital signs throughout the test and was offered caffeine after test.  Patient ambulated out of department with a steady gait.  

## 2022-07-23 ENCOUNTER — Encounter: Payer: Self-pay | Admitting: Family Medicine

## 2022-07-23 DIAGNOSIS — Z23 Encounter for immunization: Secondary | ICD-10-CM | POA: Diagnosis not present

## 2022-07-24 DIAGNOSIS — M1712 Unilateral primary osteoarthritis, left knee: Secondary | ICD-10-CM | POA: Diagnosis not present

## 2022-07-31 DIAGNOSIS — M1712 Unilateral primary osteoarthritis, left knee: Secondary | ICD-10-CM | POA: Diagnosis not present

## 2022-08-21 DIAGNOSIS — Z23 Encounter for immunization: Secondary | ICD-10-CM | POA: Diagnosis not present

## 2022-09-28 DIAGNOSIS — M79675 Pain in left toe(s): Secondary | ICD-10-CM | POA: Diagnosis not present

## 2022-10-01 DIAGNOSIS — H40053 Ocular hypertension, bilateral: Secondary | ICD-10-CM | POA: Diagnosis not present

## 2022-10-01 DIAGNOSIS — Z961 Presence of intraocular lens: Secondary | ICD-10-CM | POA: Diagnosis not present

## 2022-10-01 LAB — HM DIABETES EYE EXAM

## 2022-10-27 ENCOUNTER — Ambulatory Visit: Payer: Medicare Other | Admitting: Family Medicine

## 2022-10-27 ENCOUNTER — Telehealth: Payer: Self-pay | Admitting: Family Medicine

## 2022-10-27 NOTE — Telephone Encounter (Signed)
Pt is in th hospital his wife is being admitted right now. I took him off the dar for an OV with Dr Gena Fray, I did not send a letter.

## 2022-10-29 NOTE — Telephone Encounter (Signed)
cancelled appt - no fee - pt at hospital with wife who was being admitted

## 2022-11-04 ENCOUNTER — Encounter: Payer: Self-pay | Admitting: Family Medicine

## 2022-11-04 ENCOUNTER — Ambulatory Visit (INDEPENDENT_AMBULATORY_CARE_PROVIDER_SITE_OTHER): Payer: Medicare Other | Admitting: Family Medicine

## 2022-11-04 VITALS — BP 120/66 | HR 82 | Temp 97.5°F | Ht 72.0 in | Wt 196.4 lb

## 2022-11-04 DIAGNOSIS — R42 Dizziness and giddiness: Secondary | ICD-10-CM | POA: Diagnosis not present

## 2022-11-04 DIAGNOSIS — E782 Mixed hyperlipidemia: Secondary | ICD-10-CM

## 2022-11-04 DIAGNOSIS — N1832 Chronic kidney disease, stage 3b: Secondary | ICD-10-CM | POA: Diagnosis not present

## 2022-11-04 DIAGNOSIS — L600 Ingrowing nail: Secondary | ICD-10-CM

## 2022-11-04 DIAGNOSIS — E1122 Type 2 diabetes mellitus with diabetic chronic kidney disease: Secondary | ICD-10-CM | POA: Diagnosis not present

## 2022-11-04 DIAGNOSIS — I10 Essential (primary) hypertension: Secondary | ICD-10-CM

## 2022-11-04 DIAGNOSIS — R0602 Shortness of breath: Secondary | ICD-10-CM

## 2022-11-04 NOTE — Progress Notes (Signed)
[facility] [address] [place] [address] Dept: [phone] Dept Fax: [phone]  Chronic Care Office Visit  Subjective:    Patient ID: Nathan Gomez, male    DOB: July 05, 1941, 81 y.o..   MRN: [id number]  Chief Complaint  Patient presents with   Follow-up    3 month f/u, c/o still having dizzy spells & SOB off/on, feet pain    History of Present Illness:  Patient is in today for reassessment of chronic medical issues.  Mr. Grieshaber has a history of Type 2 diabetes (managed with metformin 1,000 mg bid), hypertension (managed with lisinopril 20 mg daily and amlodipine 10 mg daily), and hyperlipidemia (managed with atorvastatin 40 mg daily). He has Stage 3b chronic kidney disease.    Mr. Hatchel has a history of pancreatic insufficiency. He takes Creon and notes that it variably helpful. He also has IBS. He still complains of diarrhea. He has an upcoming appointment with GI.    Mr. Heinle notes a 1-2 month history of dyspnea with exertion. This mainly occurs when he is going up stairs. He has been seen by Dr. Percival Spanish (cardiology) and had a PET scan. Mr. Macqueen believes he has been able to associate some of the dyspnea symptoms with occurring just prior to his bouts of diarrhea. He plans to discuss this further with the GI doctor.   Mr. Selley continues to have stressors he feels related to his wife's health issues and how she responds to him when he tries to get her to focus on this. He notes he and his wife are scheduled for a trip to [place] in February. He feels concerned about both their medical issues and safety. He plans ot cancel his trip and request a refund.  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/25/2021   Pancreatic insufficiency 03/31/2021   Irritable bowel syndrome with diarrhea 12/30/2020   Malignant neoplasm of prostate ([facility]) 12/30/2020   Stage 3b chronic kidney disease ([place]) 05/01/2020   GERD (gastroesophageal reflux disease) 09/12/2017   Vitamin D deficiency 09/12/2017   Ruptured, tendon, Achilles, right, sequela 04/08/2017   Hypomagnesemia 04/08/2016   B12 deficiency 01/30/2016   Urinary retention 07/30/2015   Personal history of colonic adenomas 01/16/2013   Normocytic anemia 02/22/2012   Type 2 diabetes mellitus with stage 3b chronic kidney disease ([place] [facility]) 01/05/2011   Hyperlipidemia 01/05/2011   Essential hypertension 01/05/2011   Primary osteoarthritis of left knee 01/05/2011   Past Surgical History:  Procedure Laterality Date   CATARACT EXTRACTION  08/2012 L, 09/2012 R   COLONOSCOPY     PROSTATE BIOPSY     TONSILLECTOMY  1945   TOTAL HIP ARTHROPLASTY Left 2011   TOTAL HIP ARTHROPLASTY Right 07/09/2015   Procedure: TOTAL HIP ARTHROPLASTY ANTERIOR APPROACH;  Surgeon: Melrose Nakayama, MD;  Location: [facility];  Service: Orthopedics;  Laterality: Right;   TOTAL KNEE ARTHROPLASTY Right 07/21/2016   Procedure: RIGHT TOTAL KNEE ARTHROPLASTY;  Surgeon: Melrose Nakayama, MD;  Location: [facility];  Service: Orthopedics;  Laterality: Right;   Family History  Problem Relation Age of Onset   Cancer - Ovarian Mother    Heart disease  Father 69       Died age 26   Hypertension Father    Ovarian cancer Sister    Diabetes Maternal Grandfather    Pneumonia Paternal Grandfather    Heart disease Paternal Uncle    Brain cancer Cousin    Colon cancer Neg Hx    Esophageal cancer Neg Hx    Liver cancer Neg Hx    Pancreatic cancer Neg Hx    Rectal cancer Neg Hx    Stomach cancer Neg Hx    Outpatient Medications Prior to Visit  Medication Sig Dispense Refill   amLODipine  (NORVASC) 10 MG tablet TAKE 1 TABLET DAILY 90 tablet 3   atorvastatin (LIPITOR) 40 MG tablet TAKE 1 TABLET DAILY 90 tablet 3   blood glucose meter kit and supplies KIT Use to test blood sugar up to twice a day. DX E11.09 1 each 0   Cholecalciferol (VITAMIN D) 2000 units CAPS Take 1 capsule (2,000 Units total) by mouth every morning. 90 capsule 3   Continuous Blood Gluc Receiver (FREESTYLE LIBRE 14 DAY READER) DEVI 1 Device by Does not apply route as directed. 1 each 2   Cyanocobalamin (VITAMIN B12 PO) Take 1 tablet by mouth daily.     fluticasone (FLONASE) 50 MCG/ACT nasal spray Place 1 spray into both nostrils 2 (two) times daily.     glucose blood test strip Use as instructed to test blood sugar up to twice a day. DX:  E11.09 200 each 3   Krill Oil 300 MG CAPS Take 500 mg by mouth daily.      Lancets 35C MISC Delica Fine Lancets. Use to test blood sugar up to twice a day. DX E11.09 200 each 3   lipase/protease/amylase (CREON) 36000 UNITS CPEP capsule Take by mouth.     lisinopril (ZESTRIL) 20 MG tablet TAKE 1 TABLET DAILY 90 tablet 3   Magnesium 100 MG CAPS Take 100 mg by mouth 2 (two) times daily.     metFORMIN (GLUCOPHAGE-XR) 500 MG 24 hr tablet TAKE 2 TABLETS 2 TIMES     DAILY WITH MEALS 360 tablet 1   Multiple Vitamins-Minerals (SENIOR MULTIVITAMIN PLUS) TABS Take 1 tablet by mouth daily.     tamsulosin (FLOMAX) 0.4 MG CAPS capsule Take 1 capsule (0.4 mg total) by mouth at bedtime. (Patient taking differently: Take 0.4-0.8 mg by mouth See admin instructions. Take 0.4 mg by mouth daily. Starting 7 days before procedure take 0.22m by mouth daily) 14 capsule 0   No facility-administered medications prior to visit.   Allergies  Allergen Reactions   Nsaids     Kidney Disease   Sulfa Antibiotics Other (See Comments)    As child    Not sure rxn   Thiazide-Type Diuretics Other (See Comments)    Unknown     Objective:   Today's Vitals   11/04/22 1336  BP: 120/66  Pulse: 82  Temp: (!)  97.5 F (36.4 C)  TempSrc: Temporal  SpO2: 95%  Weight: 196 lb 6.4 oz (89.1 kg)  Height: 6' (1.829 m)   Body mass index is 26.64 kg/m.   General: Well developed, well nourished. No acute distress. HEENT: Normocephalic, non-traumatic. External ears normal. Left EAC with small amount of wax near the external auditory meatus. Removed with a curette.  TMs normal bilaterally.  Feet- Skin intact. No sign of maceration between toes. Nails are normal, thoguh there is a healing   ingrown nail on the left great toe. There is a  thickened callus along the foot margin near the heal on the   left. Dorsalis pedis and posterior tibial artery pulses are normal. 5.07 monofilament testing normal. Psych: Alert and oriented. Normal mood and affect.  Health Maintenance Due  Topic Date Due   OPHTHALMOLOGY EXAM  08/14/2022     Assessment & Plan:   1. Type 2 diabetes mellitus with stage 3b chronic kidney disease, without long-term current use of insulin (HCC) We will check his A1c today. Plan to continue metformin 1,000 mg bid. I will refer to podiatry for management of his callus and to determine if anything further needs to be done about the prior ingrown nail.  - Glucose, random - Hemoglobin A1c - Ambulatory referral to Podiatry  2. Essential hypertension Blood pressure is at goal. His lightheadedness could be some orthostasis. I advised him to move his time for taking his medication to bedtime. Continue  lisinopril 20 mg daily and amlodipine 10 mg daily .  3. Mixed hyperlipidemia Continue atorvastatin 40 mg daily.  4. Lightheadedness As above. Possibly related to medication timing for BP meds, as symptoms are worse int he morning.  5. SOB (shortness of breath) Etiology remains unclear. e will follow-up after his GI appointment.  6. Ingrown nail of great toe of left foot Healing. I will have podiatry reassess.  - Ambulatory referral to Podiatry   Return in about 3 months (around 02/03/2023)  for Reassessment.   Haydee Salter, MD

## 2022-11-05 ENCOUNTER — Encounter: Payer: Self-pay | Admitting: Family Medicine

## 2022-11-05 LAB — GLUCOSE, RANDOM: Glucose, Bld: 160 mg/dL — ABNORMAL HIGH (ref 70–99)

## 2022-11-05 LAB — HEMOGLOBIN A1C: Hgb A1c MFr Bld: 7.3 % — ABNORMAL HIGH (ref 4.6–6.5)

## 2022-11-23 ENCOUNTER — Encounter: Payer: Self-pay | Admitting: Podiatry

## 2022-11-23 ENCOUNTER — Ambulatory Visit (INDEPENDENT_AMBULATORY_CARE_PROVIDER_SITE_OTHER): Payer: Medicare Other | Admitting: Podiatry

## 2022-11-23 VITALS — BP 142/78

## 2022-11-23 DIAGNOSIS — L84 Corns and callosities: Secondary | ICD-10-CM

## 2022-11-23 DIAGNOSIS — N1832 Chronic kidney disease, stage 3b: Secondary | ICD-10-CM | POA: Diagnosis not present

## 2022-11-23 DIAGNOSIS — B351 Tinea unguium: Secondary | ICD-10-CM | POA: Diagnosis not present

## 2022-11-23 DIAGNOSIS — M79675 Pain in left toe(s): Secondary | ICD-10-CM

## 2022-11-23 DIAGNOSIS — E1122 Type 2 diabetes mellitus with diabetic chronic kidney disease: Secondary | ICD-10-CM

## 2022-11-23 DIAGNOSIS — M79674 Pain in right toe(s): Secondary | ICD-10-CM

## 2022-11-23 NOTE — Progress Notes (Signed)
  Subjective:  Patient ID: Nathan Gomez, male    DOB: [birth year],   MRN: [id number]  Chief Complaint  Patient presents with   Nail Problem    Nail trim    Callouses    On heel of left foot , pt states it is not painful     82 y.o. male presents for concern of thickened elongated and painful nails that are difficult to trim. Requesting to have them trimmed today. Also has concern for callous on the outside of his left foot. Denies burning and tingling in their feet. Patient is diabetic and last A1c was  Lab Results  Component Value Date   HGBA1C 7.3 (H) 11/04/2022   .   PCP:  Haydee Salter, MD    . Denies any other pedal complaints. Denies n/v/f/c.   Past Medical History:  Diagnosis Date   Achilles tendinitis    Allergy    Cataract    Chronic kidney disease    Complication of anesthesia    urinary retention   Degenerative tear of left medial meniscus 11/2014   Diabetes mellitus, type 2 (HCC)    GERD (gastroesophageal reflux disease)    Glaucoma    Hyperlipidemia    Hypertension    Osteoarthritis    right knee   Pancreatic insufficiency    Personal history of colonic adenomas 01/16/2013   Plantar fasciitis    Prostate cancer (HCC)     Objective:  Physical Exam: Vascular: DP/PT pulses 2/4 bilateral. CFT <3 seconds. Absent hair growth on digits. Edema noted to bilateral lower extremities. Xerosis noted bilaterally.  Skin. No lacerations or abrasions bilateral feet. Nails 1-5 bilateral  are thickened discolored and elongated with subungual debris. Hyperkeartotic lesion noted to lateral left heel.  Musculoskeletal: MMT 5/5 bilateral lower extremities in DF, PF, Inversion and Eversion. Deceased ROM in DF of ankle joint.  Neurological: Sensation intact to light touch. Protective sensation intact bilateral.    Assessment:   1. Type 2 diabetes mellitus with stage 3b chronic kidney disease, without long-term current use of insulin (HCC)   2. Pain due to onychomycosis of  toenails of both feet   3. Corn      Plan:  Patient was evaluated and treated and all questions answered. -Discussed and educated patient on diabetic foot care, especially with  regards to the vascular, neurological and musculoskeletal systems.  -Stressed the importance of good glycemic control and the detriment of not  controlling glucose levels in relation to the foot. -Discussed supportive shoes at all times and checking feet regularly.  -Mechanically debrided all nails 1-5 bilateral using sterile nail nipper and filed with dremel without incident -Hyperkeratotic tissue debrided without issue with chisel.   -Answered all patient questions -Patient to return  in 3 months for at risk foot care -Patient advised to call the office if any problems or questions arise in the meantime.   Lorenda Peck, DPM

## 2022-11-26 DIAGNOSIS — K529 Noninfective gastroenteritis and colitis, unspecified: Secondary | ICD-10-CM | POA: Diagnosis not present

## 2022-11-26 DIAGNOSIS — K8681 Exocrine pancreatic insufficiency: Secondary | ICD-10-CM | POA: Diagnosis not present

## 2022-12-01 DIAGNOSIS — K8681 Exocrine pancreatic insufficiency: Secondary | ICD-10-CM | POA: Diagnosis not present

## 2022-12-01 DIAGNOSIS — K529 Noninfective gastroenteritis and colitis, unspecified: Secondary | ICD-10-CM | POA: Diagnosis not present

## 2022-12-03 ENCOUNTER — Encounter: Payer: Self-pay | Admitting: Family Medicine

## 2022-12-17 ENCOUNTER — Encounter: Payer: Self-pay | Admitting: Family Medicine

## 2022-12-23 DIAGNOSIS — A0472 Enterocolitis due to Clostridium difficile, not specified as recurrent: Secondary | ICD-10-CM | POA: Diagnosis not present

## 2022-12-23 DIAGNOSIS — R197 Diarrhea, unspecified: Secondary | ICD-10-CM | POA: Diagnosis not present

## 2022-12-23 DIAGNOSIS — K8681 Exocrine pancreatic insufficiency: Secondary | ICD-10-CM | POA: Diagnosis not present

## 2023-01-15 DIAGNOSIS — A0472 Enterocolitis due to Clostridium difficile, not specified as recurrent: Secondary | ICD-10-CM | POA: Diagnosis not present

## 2023-01-15 DIAGNOSIS — R197 Diarrhea, unspecified: Secondary | ICD-10-CM | POA: Diagnosis not present

## 2023-01-20 DIAGNOSIS — M1712 Unilateral primary osteoarthritis, left knee: Secondary | ICD-10-CM | POA: Diagnosis not present

## 2023-01-22 DIAGNOSIS — C61 Malignant neoplasm of prostate: Secondary | ICD-10-CM | POA: Diagnosis not present

## 2023-01-29 DIAGNOSIS — N401 Enlarged prostate with lower urinary tract symptoms: Secondary | ICD-10-CM | POA: Diagnosis not present

## 2023-01-29 DIAGNOSIS — R351 Nocturia: Secondary | ICD-10-CM | POA: Diagnosis not present

## 2023-01-29 DIAGNOSIS — R3914 Feeling of incomplete bladder emptying: Secondary | ICD-10-CM | POA: Diagnosis not present

## 2023-01-29 DIAGNOSIS — C61 Malignant neoplasm of prostate: Secondary | ICD-10-CM | POA: Diagnosis not present

## 2023-02-03 ENCOUNTER — Encounter: Payer: Self-pay | Admitting: Family Medicine

## 2023-02-03 ENCOUNTER — Ambulatory Visit (INDEPENDENT_AMBULATORY_CARE_PROVIDER_SITE_OTHER): Payer: Medicare Other | Admitting: Family Medicine

## 2023-02-03 VITALS — BP 130/64 | HR 66 | Temp 98.2°F | Ht 72.0 in | Wt 194.4 lb

## 2023-02-03 DIAGNOSIS — A0472 Enterocolitis due to Clostridium difficile, not specified as recurrent: Secondary | ICD-10-CM

## 2023-02-03 DIAGNOSIS — E1122 Type 2 diabetes mellitus with diabetic chronic kidney disease: Secondary | ICD-10-CM | POA: Diagnosis not present

## 2023-02-03 DIAGNOSIS — N1832 Chronic kidney disease, stage 3b: Secondary | ICD-10-CM | POA: Diagnosis not present

## 2023-02-03 DIAGNOSIS — I10 Essential (primary) hypertension: Secondary | ICD-10-CM

## 2023-02-03 LAB — BASIC METABOLIC PANEL
BUN: 27 mg/dL — ABNORMAL HIGH (ref 6–23)
CO2: 23 mEq/L (ref 19–32)
Calcium: 9.1 mg/dL (ref 8.4–10.5)
Chloride: 109 mEq/L (ref 96–112)
Creatinine, Ser: 1.54 mg/dL — ABNORMAL HIGH (ref 0.40–1.50)
GFR: 41.38 mL/min — ABNORMAL LOW (ref >60.00)
Glucose, Bld: 117 mg/dL — ABNORMAL HIGH (ref 70–99)
Potassium: 5.5 mEq/L — ABNORMAL HIGH (ref 3.5–5.1)
Sodium: 139 mEq/L (ref 135–145)

## 2023-02-03 LAB — MICROALBUMIN / CREATININE URINE RATIO
Creatinine,U: 56.1 mg/dL
Microalb Creat Ratio: 10.1 mg/g (ref 0.0–30.0)
Microalb, Ur: 5.7 mg/dL — ABNORMAL HIGH (ref 0.0–1.9)

## 2023-02-03 LAB — HEMOGLOBIN A1C: Hgb A1c MFr Bld: 7.2 % — ABNORMAL HIGH (ref 4.6–6.5)

## 2023-02-03 NOTE — Assessment & Plan Note (Signed)
Blood pressure is adequately controlled. I will continue lisinopril 20 mg daily and amlodipine 10 mg daily.

## 2023-02-03 NOTE — Progress Notes (Signed)
[facility] [address] [place] [address] Dept: [phone] Dept Fax: [phone]  Chronic Care Office Visit  Subjective:    Patient ID: Nathan Gomez, male    DOB: 11-30-1940, 82 y.o..   MRN: [id number]  Chief Complaint  Patient presents with   Medical Management of Chronic Issues    3 month f/u.  Fasting today.    History of Present Illness:  Patient is in today for reassessment of chronic medical issues.  Mr. Sagar has a history of Type 2 diabetes (managed with metformin 1,000 mg bid), hypertension (managed with lisinopril 20 mg daily and amlodipine 10 mg daily), and hyperlipidemia (managed with atorvastatin 40 mg daily). He has Stage 3b chronic kidney disease.    Mr. Mihelich has a history of pancreatic insufficiency. He takes Creon and notes that it variably helpful. He also has IBS. Since his last visit, he was seen by GI related to ongoing diarrhea. He informs me he was diagnosed with C. difficile diarrhea. He took an initial course of vancomycin, but this did not clear the infection. He is now on a second, pulsed course of vancomycin.  Past Medical History: Patient Active Problem List   Diagnosis Date Noted   C. 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/25/2021   Pancreatic insufficiency 03/31/2021   Irritable bowel syndrome with diarrhea 12/30/2020   Malignant neoplasm of prostate ([facility]) 12/30/2020   Stage 3b chronic kidney disease ([facility]) 05/01/2020   GERD (gastroesophageal reflux disease) 09/12/2017   Vitamin D deficiency 09/12/2017   Ruptured, tendon, Achilles, right, sequela 04/08/2017   Hypomagnesemia 04/08/2016   B12 deficiency  01/30/2016   Urinary retention 07/30/2015   Personal history of colonic adenomas 01/16/2013   Normocytic anemia 02/22/2012   Type 2 diabetes mellitus with stage 3b chronic kidney disease ([facility]) 01/05/2011   Hyperlipidemia 01/05/2011   Essential hypertension 01/05/2011   Primary osteoarthritis of left knee 01/05/2011   Past Surgical History:  Procedure Laterality Date   CATARACT EXTRACTION  08/2012 L, 09/2012 R   COLONOSCOPY     PROSTATE BIOPSY     TONSILLECTOMY  1945   TOTAL HIP ARTHROPLASTY Left 2011   TOTAL HIP ARTHROPLASTY Right 07/09/2015   Procedure: TOTAL HIP ARTHROPLASTY ANTERIOR APPROACH;  Surgeon: Melrose Nakayama, MD;  Location: [facility];  Service: Orthopedics;  Laterality: Right;   TOTAL KNEE ARTHROPLASTY Right 07/21/2016   Procedure: RIGHT TOTAL KNEE ARTHROPLASTY;  Surgeon: Melrose Nakayama, MD;  Location: [facility];  Service: Orthopedics;  Laterality: Right;   Family History  Problem Relation Age of Onset   Cancer - Ovarian Mother    Heart disease Father 81       Died age 20   Hypertension Father    Ovarian cancer Sister    Diabetes Maternal Grandfather    Pneumonia Paternal Grandfather    Heart disease Paternal Uncle    Brain cancer Cousin    Colon cancer Neg Hx    Esophageal cancer Neg Hx    Liver cancer Neg Hx    Pancreatic cancer Neg Hx    Rectal cancer Neg Hx    Stomach cancer Neg Hx    Outpatient Medications Prior to Visit  Medication  Sig Dispense Refill   amLODipine (NORVASC) 10 MG tablet TAKE 1 TABLET DAILY 90 tablet 3   atorvastatin (LIPITOR) 40 MG tablet TAKE 1 TABLET DAILY 90 tablet 3   blood glucose meter kit and supplies KIT Use to test blood sugar up to twice a day. DX E11.09 1 each 0   Cholecalciferol (VITAMIN D) 2000 units CAPS Take 1 capsule (2,000 Units total) by mouth every morning. 90 capsule 3   Continuous Blood Gluc Receiver (FREESTYLE LIBRE 14 DAY READER) DEVI 1 Device by Does not apply route as directed. 1 each 2   Cyanocobalamin (VITAMIN B12 PO)  Take 1 tablet by mouth daily.     fluticasone (FLONASE) 50 MCG/ACT nasal spray Place 1 spray into both nostrils 2 (two) times daily.     glucose blood test strip Use as instructed to test blood sugar up to twice a day. DX:  E11.09 200 each 3   Krill Oil 300 MG CAPS Take 500 mg by mouth daily.      lactobacillus acidophilus (BACID) TABS tablet Take 2 tablets by mouth 3 (three) times daily.     Lancets 99991111 MISC Delica Fine Lancets. Use to test blood sugar up to twice a day. DX E11.09 200 each 3   lipase/protease/amylase (CREON) 36000 UNITS CPEP capsule Take by mouth.     lisinopril (ZESTRIL) 20 MG tablet TAKE 1 TABLET DAILY 90 tablet 3   Magnesium 100 MG CAPS Take 100 mg by mouth 2 (two) times daily.     metFORMIN (GLUCOPHAGE-XR) 500 MG 24 hr tablet TAKE 2 TABLETS 2 TIMES     DAILY WITH MEALS 360 tablet 1   Multiple Vitamins-Minerals (SENIOR MULTIVITAMIN PLUS) TABS Take 1 tablet by mouth daily.     psyllium (METAMUCIL) 58.6 % powder Take 1 packet by mouth 3 (three) times daily.     tamsulosin (FLOMAX) 0.4 MG CAPS capsule Take 1 capsule (0.4 mg total) by mouth at bedtime. (Patient taking differently: Take 0.4-0.8 mg by mouth See admin instructions. Take 0.4 mg by mouth daily. Starting 7 days before procedure take 0.8mg  by mouth daily) 14 capsule 0   vancomycin (VANCOCIN) 125 MG capsule 1 capsule Orally as directed for 45 days     No facility-administered medications prior to visit.   Allergies  Allergen Reactions   Nsaids     Kidney Disease   Sulfa Antibiotics Other (See Comments)    As child    Not sure rxn   Thiazide-Type Diuretics Other (See Comments)    Unknown   Objective:   Today's Vitals   02/03/23 0752  BP: 130/64  Pulse: 66  Temp: 98.2 F (36.8 C)  TempSrc: Temporal  SpO2: 96%  Weight: 194 lb 6.4 oz (88.2 kg)  Height: 6' (1.829 m)   Body mass index is 26.37 kg/m.   General: Well developed, well nourished. No acute distress. Psych: Alert and oriented. Normal mood and  affect.  Health Maintenance Due  Topic Date Due   Diabetic kidney evaluation - Urine ACR  01/06/2023     Assessment & Plan:   Problem List Items Addressed This Visit       Cardiovascular and Mediastinum   Essential hypertension    Blood pressure is adequately controlled. I will continue lisinopril 20 mg daily and amlodipine 10 mg daily.        Digestive   C. difficile diarrhea    Currently on a 2nd course of vancomycin. We did discuss that if his symptoms  persist after this, we might consider having him see an infectious disease specialist.      Relevant Medications   vancomycin (VANCOCIN) 125 MG capsule     Endocrine   Type 2 diabetes mellitus with stage 3b chronic kidney disease ([facility]) - Primary    Diabetes has been in fair control. Continue metformin 1,000 mg bid.      Relevant Orders   Microalbumin / creatinine urine ratio   Hemoglobin 123456   Basic metabolic panel     Genitourinary   Stage 3b chronic kidney disease ([place])    Continue focus on blood pressure control and avoidance of NSAIDs.       Return in about 3 months (around 05/06/2023) for Reassessment.   Haydee Salter, MD

## 2023-02-03 NOTE — Assessment & Plan Note (Signed)
Diabetes has been in fair control. Continue metformin 1,000 mg bid.

## 2023-02-03 NOTE — Assessment & Plan Note (Signed)
Continue focus on blood pressure control and avoidance of NSAIDs.

## 2023-02-03 NOTE — Assessment & Plan Note (Signed)
Currently on a 2nd course of vancomycin. We did discuss that if his symptoms persist after this, we might consider having him see an infectious disease specialist.

## 2023-02-15 DIAGNOSIS — M1712 Unilateral primary osteoarthritis, left knee: Secondary | ICD-10-CM | POA: Diagnosis not present

## 2023-02-22 ENCOUNTER — Ambulatory Visit (INDEPENDENT_AMBULATORY_CARE_PROVIDER_SITE_OTHER): Payer: Medicare Other | Admitting: Podiatry

## 2023-02-22 ENCOUNTER — Encounter: Payer: Self-pay | Admitting: Podiatry

## 2023-02-22 DIAGNOSIS — E1122 Type 2 diabetes mellitus with diabetic chronic kidney disease: Secondary | ICD-10-CM | POA: Diagnosis not present

## 2023-02-22 DIAGNOSIS — N1832 Chronic kidney disease, stage 3b: Secondary | ICD-10-CM

## 2023-02-22 DIAGNOSIS — L84 Corns and callosities: Secondary | ICD-10-CM | POA: Diagnosis not present

## 2023-02-22 DIAGNOSIS — M79674 Pain in right toe(s): Secondary | ICD-10-CM

## 2023-02-22 DIAGNOSIS — M1712 Unilateral primary osteoarthritis, left knee: Secondary | ICD-10-CM | POA: Diagnosis not present

## 2023-02-22 DIAGNOSIS — B351 Tinea unguium: Secondary | ICD-10-CM

## 2023-02-22 DIAGNOSIS — M79675 Pain in left toe(s): Secondary | ICD-10-CM | POA: Diagnosis not present

## 2023-02-22 NOTE — Progress Notes (Signed)
  Subjective:  Patient ID: Nathan Gomez, male    DOB: 1941-05-27,   MRN: [id number]  Chief Complaint  Patient presents with   Nail Problem     Routine foot care    82 y.o. male presents for concern of thickened elongated and painful nails that are difficult to trim. Requesting to have them trimmed today. Also has concern for callous on the outside of his left foot. Denies burning and tingling in their feet. Patient is diabetic and last A1c was  Lab Results  Component Value Date   HGBA1C 7.2 (H) 02/03/2023   .   PCP:  Loyola Mast, MD    . Denies any other pedal complaints. Denies n/v/f/c.   Past Medical History:  Diagnosis Date   Achilles tendinitis    Allergy    Cataract    Chronic kidney disease    Complication of anesthesia    urinary retention   Degenerative tear of left medial meniscus 11/2014   Diabetes mellitus, type 2    GERD (gastroesophageal reflux disease)    Glaucoma    Hyperlipidemia    Hypertension    Osteoarthritis    right knee   Pancreatic insufficiency    Personal history of colonic adenomas 01/16/2013   Plantar fasciitis    Prostate cancer     Objective:  Physical Exam: Vascular: DP/PT pulses 2/4 bilateral. CFT <3 seconds. Absent hair growth on digits. Edema noted to bilateral lower extremities. Xerosis noted bilaterally.  Skin. No lacerations or abrasions bilateral feet. Nails 1-5 bilateral  are thickened discolored and elongated with subungual debris. Hyperkeartotic lesion noted to lateral left heel.  Musculoskeletal: MMT 5/5 bilateral lower extremities in DF, PF, Inversion and Eversion. Deceased ROM in DF of ankle joint.  Neurological: Sensation intact to light touch. Protective sensation intact bilateral.    Assessment:   1. Pain due to onychomycosis of toenails of both feet   2. Corn   3. Type 2 diabetes mellitus with stage 3b chronic kidney disease, without long-term current use of insulin       Plan:  Patient was evaluated and  treated and all questions answered. -Discussed and educated patient on diabetic foot care, especially with  regards to the vascular, neurological and musculoskeletal systems.  -Stressed the importance of good glycemic control and the detriment of not  controlling glucose levels in relation to the foot. -Discussed supportive shoes at all times and checking feet regularly.  -Mechanically debrided all nails 1-5 bilateral using sterile nail nipper and filed with dremel without incident -Hyperkeratotic tissue debrided without issue with chisel.   -Answered all patient questions -Patient to return  in 3 months for at risk foot care -Patient advised to call the office if any problems or questions arise in the meantime.   Louann Sjogren, DPM

## 2023-03-12 ENCOUNTER — Encounter: Payer: Self-pay | Admitting: Family Medicine

## 2023-03-15 MED ORDER — ATORVASTATIN CALCIUM 40 MG PO TABS: 40.0000 mg | ORAL_TABLET | Freq: Every day | ORAL | 3 refills | Status: DC

## 2023-04-08 DIAGNOSIS — A0472 Enterocolitis due to Clostridium difficile, not specified as recurrent: Secondary | ICD-10-CM | POA: Diagnosis not present

## 2023-04-08 DIAGNOSIS — K8681 Exocrine pancreatic insufficiency: Secondary | ICD-10-CM | POA: Diagnosis not present

## 2023-04-11 DIAGNOSIS — R21 Rash and other nonspecific skin eruption: Secondary | ICD-10-CM | POA: Diagnosis not present

## 2023-05-06 ENCOUNTER — Ambulatory Visit (INDEPENDENT_AMBULATORY_CARE_PROVIDER_SITE_OTHER): Payer: Medicare Other | Admitting: Family Medicine

## 2023-05-06 ENCOUNTER — Encounter: Payer: Self-pay | Admitting: Family Medicine

## 2023-05-06 VITALS — BP 132/66 | HR 72 | Temp 98.1°F | Ht 72.0 in | Wt 194.4 lb

## 2023-05-06 DIAGNOSIS — E538 Deficiency of other specified B group vitamins: Secondary | ICD-10-CM

## 2023-05-06 DIAGNOSIS — E559 Vitamin D deficiency, unspecified: Secondary | ICD-10-CM

## 2023-05-06 DIAGNOSIS — N1832 Chronic kidney disease, stage 3b: Secondary | ICD-10-CM

## 2023-05-06 DIAGNOSIS — Z7984 Long term (current) use of oral hypoglycemic drugs: Secondary | ICD-10-CM

## 2023-05-06 DIAGNOSIS — Z8619 Personal history of other infectious and parasitic diseases: Secondary | ICD-10-CM | POA: Insufficient documentation

## 2023-05-06 DIAGNOSIS — K8689 Other specified diseases of pancreas: Secondary | ICD-10-CM

## 2023-05-06 DIAGNOSIS — E782 Mixed hyperlipidemia: Secondary | ICD-10-CM

## 2023-05-06 DIAGNOSIS — I10 Essential (primary) hypertension: Secondary | ICD-10-CM

## 2023-05-06 DIAGNOSIS — E1122 Type 2 diabetes mellitus with diabetic chronic kidney disease: Secondary | ICD-10-CM

## 2023-05-06 LAB — CBC
HCT: 30.9 % — ABNORMAL LOW (ref 39.0–52.0)
Hemoglobin: 10.3 g/dL — ABNORMAL LOW (ref 13.0–17.0)
MCHC: 33.2 g/dL (ref 30.0–36.0)
MCV: 94.5 fl (ref 78.0–100.0)
Platelets: 191 10*3/uL (ref 150.0–400.0)
RBC: 3.27 Mil/uL — ABNORMAL LOW (ref 4.22–5.81)
RDW: 14.1 % (ref 11.5–15.5)
WBC: 5.3 10*3/uL (ref 4.0–10.5)

## 2023-05-06 LAB — BASIC METABOLIC PANEL
BUN: 34 mg/dL — ABNORMAL HIGH (ref 6–23)
CO2: 21 mEq/L (ref 19–32)
Calcium: 9.1 mg/dL (ref 8.4–10.5)
Chloride: 108 mEq/L (ref 96–112)
Creatinine, Ser: 1.66 mg/dL — ABNORMAL HIGH (ref 0.40–1.50)
GFR: 37.75 mL/min — ABNORMAL LOW (ref >60.00)
Glucose, Bld: 125 mg/dL — ABNORMAL HIGH (ref 70–99)
Potassium: 5.1 mEq/L (ref 3.5–5.1)
Sodium: 138 mEq/L (ref 135–145)

## 2023-05-06 LAB — VITAMIN D 25 HYDROXY (VIT D DEFICIENCY, FRACTURES): VITD: 64.93 ng/mL (ref 30.00–100.00)

## 2023-05-06 LAB — VITAMIN B12: Vitamin B-12: 581 pg/mL (ref 211–911)

## 2023-05-06 LAB — PHOSPHORUS: Phosphorus: 3.6 mg/dL (ref 2.3–4.6)

## 2023-05-06 LAB — HEMOGLOBIN A1C: Hgb A1c MFr Bld: 7 % — ABNORMAL HIGH (ref 4.6–6.5)

## 2023-05-06 MED ORDER — ATORVASTATIN CALCIUM 40 MG PO TABS: 40.0000 mg | ORAL_TABLET | Freq: Every day | ORAL | 3 refills | Status: DC

## 2023-05-06 NOTE — Assessment & Plan Note (Signed)
Lipids are at goal. Continue atorvastatin 40 mg daily. 

## 2023-05-06 NOTE — Assessment & Plan Note (Signed)
Stable. Continue use of Creon with meals. We discussed the dizziness. It sounds like this is an issue he will need to anticipate and adjust his morning plans for when he has episodes.

## 2023-05-06 NOTE — Assessment & Plan Note (Signed)
Diabetes has been in fair control with his A1c in the low 7% range. Continue metformin 1,000 mg bid.

## 2023-05-06 NOTE — Assessment & Plan Note (Signed)
Continue focus on blood pressure control and avoidance of NSAIDs. I will check labs to screen for complications.

## 2023-05-06 NOTE — Assessment & Plan Note (Signed)
I will monitor his B12 level. He will continue his B12 supplement.

## 2023-05-06 NOTE — Assessment & Plan Note (Signed)
I will reassess Vitamin D level today. Continue Vitamin D supplement.

## 2023-05-06 NOTE — Assessment & Plan Note (Signed)
Blood pressure is adequately controlled. Nathan Gomez exhibits some isolated systolic hypertension. I will continue lisinopril 20 mg daily and amlodipine 10 mg daily.

## 2023-05-06 NOTE — Progress Notes (Signed)
[facility] [address] [place] [address] Dept: [phone] Dept Fax: [phone]  Chronic Care Office Visit  Subjective:    Patient ID: Nathan Gomez, male    DOB: Dec 01, 1940, 82 y.o..   MRN: [id number]  Chief Complaint  Patient presents with   Medical Management of Chronic Issues    3 month follow up, no concerns. Patient fasting.    History of Present Illness:  Patient is in today for reassessment of chronic medical issues.  Nathan Gomez has a history of Type 2 diabetes (managed with metformin 1,000 mg bid), hypertension (managed with lisinopril 20 mg daily and amlodipine 10 mg daily), and hyperlipidemia (managed with atorvastatin 40 mg daily). He has Stage 3b chronic kidney disease.    Nathan Gomez has a history of pancreatic insufficiency. He takes Creon and notes that it variably helpful. He also has IBS. He had C. difficile diarrhea this past year. He notes that after two courses of antibiotics, this is doing better at this point. He continues to have episodes of morning dizziness that is linked to when he eats breakfast. His gastroenterologist has him separating solids form liquids, but he can't tell that this has improved his symptoms that much.  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/2021   Pancreatic insufficiency 03/31/2021   Irritable bowel syndrome with diarrhea 12/30/2020   Malignant neoplasm of prostate (HCC) 12/30/2020   Stage 3b chronic kidney disease (HCC) 05/01/2020   GERD (gastroesophageal reflux disease) 09/12/2017    Vitamin D deficiency 09/12/2017   Ruptured, tendon, Achilles, right, sequela 04/08/2017   Hypomagnesemia 04/08/2016   B12 deficiency 01/30/2016   Urinary retention 07/30/2015   Personal history of colonic adenomas 01/16/2013   Normocytic anemia 02/22/2012   Type 2 diabetes mellitus with stage 3b chronic kidney disease (HCC) 01/05/2011   Hyperlipidemia 01/05/2011   Essential hypertension 01/05/2011   Primary osteoarthritis of left knee 01/05/2011   Past Surgical History:  Procedure Laterality Date   CATARACT EXTRACTION  08/2012 L, 09/2012 R   COLONOSCOPY     PROSTATE BIOPSY     TONSILLECTOMY  1945   TOTAL HIP ARTHROPLASTY Left 2011   TOTAL HIP ARTHROPLASTY Right 07/09/2015   Procedure: TOTAL HIP ARTHROPLASTY ANTERIOR APPROACH;  Surgeon: Marcene Corning, MD;  Location: [facility] OR;  Service: Orthopedics;  Laterality: Right;   TOTAL KNEE ARTHROPLASTY Right 07/21/2016   Procedure: RIGHT TOTAL KNEE ARTHROPLASTY;  Surgeon: Marcene Corning, MD;  Location: [facility] OR;  Service: Orthopedics;  Laterality: Right;   Family History  Problem Relation Age of Onset   Cancer - Ovarian Mother    Heart disease Father 36       Died age 21   Hypertension Father    Ovarian cancer Sister    Diabetes Maternal Grandfather    Pneumonia Paternal Grandfather    Heart disease Paternal Uncle    Brain cancer Cousin    Colon cancer Neg Hx    Esophageal cancer Neg Hx    Liver cancer Neg Hx    Pancreatic cancer Neg Hx    Rectal cancer Neg Hx  Stomach cancer Neg Hx    Outpatient Medications Prior to Visit  Medication Sig Dispense Refill   amLODipine (NORVASC) 10 MG tablet TAKE 1 TABLET DAILY 90 tablet 3   blood glucose meter kit and supplies KIT Use to test blood sugar up to twice a day. DX E11.09 1 each 0   Cholecalciferol (VITAMIN D) 2000 units CAPS Take 1 capsule (2,000 Units total) by mouth every morning. 90 capsule 3   Continuous Blood Gluc Receiver (FREESTYLE LIBRE 14 DAY READER) DEVI 1 Device by Does not  apply route as directed. 1 each 2   Cyanocobalamin (VITAMIN B12 PO) Take 1 tablet by mouth daily.     fluticasone (FLONASE) 50 MCG/ACT nasal spray Place 1 spray into both nostrils 2 (two) times daily.     glucose blood test strip Use as instructed to test blood sugar up to twice a day. DX:  E11.09 200 each 3   Krill Oil 300 MG CAPS Take 500 mg by mouth daily.      lactobacillus acidophilus (BACID) TABS tablet Take 2 tablets by mouth 3 (three) times daily.     Lancets 30G MISC Delica Fine Lancets. Use to test blood sugar up to twice a day. DX E11.09 200 each 3   lipase/protease/amylase (CREON) 36000 UNITS CPEP capsule Take by mouth.     lisinopril (ZESTRIL) 20 MG tablet TAKE 1 TABLET DAILY 90 tablet 3   Magnesium 100 MG CAPS Take 100 mg by mouth 2 (two) times daily.     metFORMIN (GLUCOPHAGE-XR) 500 MG 24 hr tablet TAKE 2 TABLETS 2 TIMES     DAILY WITH MEALS 360 tablet 1   Multiple Vitamins-Minerals (SENIOR MULTIVITAMIN PLUS) TABS Take 1 tablet by mouth daily.     psyllium (METAMUCIL) 58.6 % powder Take 1 packet by mouth 3 (three) times daily.     tamsulosin (FLOMAX) 0.4 MG CAPS capsule Take 1 capsule (0.4 mg total) by mouth at bedtime. (Patient taking differently: Take 0.4-0.8 mg by mouth See admin instructions. Take 0.4 mg by mouth daily. Starting 7 days before procedure take 0.8mg  by mouth daily) 14 capsule 0   atorvastatin (LIPITOR) 40 MG tablet Take 1 tablet (40 mg total) by mouth daily. 90 tablet 3   vancomycin (VANCOCIN) 125 MG capsule 1 capsule Orally as directed for 45 days (Patient not taking: Reported on 05/06/2023)     No facility-administered medications prior to visit.   Allergies  Allergen Reactions   Nsaids     Kidney Disease   Sulfa Antibiotics Other (See Comments)    As child    Not sure rxn   Thiazide-Type Diuretics Other (See Comments)    Unknown   Objective:   Today's Vitals   05/06/23 0804  BP: 132/66  Pulse: 72  Temp: 98.1 F (36.7 C)  SpO2: 97%  Weight: 194  lb 6.4 oz (88.2 kg)  Height: 6' (1.829 m)   Body mass index is 26.37 kg/m.   General: Well developed, well nourished. No acute distress. Psych: Alert and oriented. Normal mood and affect.  There are no preventive care reminders to display for this patient.    Assessment & Plan:   Problem List Items Addressed This Visit       Cardiovascular and Mediastinum   Essential hypertension - Primary    Blood pressure is adequately controlled. Mr. Prorok exhibits some isolated systolic hypertension. I will continue lisinopril 20 mg daily and amlodipine 10 mg daily.      Relevant  Medications   atorvastatin (LIPITOR) 40 MG tablet     Digestive   Pancreatic insufficiency    Stable. Continue use of Creon with meals. We discussed the dizziness. It sounds like this is an issue he will need to anticipate and adjust his morning plans for when he has episodes.        Endocrine   Type 2 diabetes mellitus with stage 3b chronic kidney disease (HCC)    Diabetes has been in fair control with his A1c in the low 7% range. Continue metformin 1,000 mg bid.      Relevant Medications   atorvastatin (LIPITOR) 40 MG tablet   Other Relevant Orders   Hemoglobin A1c   Basic metabolic panel     Genitourinary   Stage 3b chronic kidney disease (HCC)    Continue focus on blood pressure control and avoidance of NSAIDs. I will check labs to screen for complications.      Relevant Orders   Basic metabolic panel   CBC   Phosphorus   Parathyroid hormone, intact (no Ca)     Other   Hyperlipidemia    Lipids are at goal. Continue atorvastatin 40 mg daily.      Relevant Medications   atorvastatin (LIPITOR) 40 MG tablet   B12 deficiency    I will monitor his B12 level. He will continue his B12 supplement.      Relevant Orders   Vitamin B12   Vitamin D deficiency    I will reassess Vitamin D level today. Continue Vitamin D supplement.      Relevant Orders   VITAMIN D 25 Hydroxy (Vit-D Deficiency,  Fractures)    Return in about 3 months (around 08/06/2023) for Reassessment.   Loyola Mast, MD

## 2023-05-07 LAB — PARATHYROID HORMONE, INTACT (NO CA): PTH: 53 pg/mL (ref 16–77)

## 2023-05-12 ENCOUNTER — Other Ambulatory Visit (HOSPITAL_BASED_OUTPATIENT_CLINIC_OR_DEPARTMENT_OTHER): Payer: Self-pay | Admitting: Orthopaedic Surgery

## 2023-05-12 ENCOUNTER — Encounter (HOSPITAL_BASED_OUTPATIENT_CLINIC_OR_DEPARTMENT_OTHER): Payer: Self-pay

## 2023-05-12 ENCOUNTER — Encounter (INDEPENDENT_AMBULATORY_CARE_PROVIDER_SITE_OTHER): Payer: Medicare Other

## 2023-05-12 DIAGNOSIS — M79605 Pain in left leg: Secondary | ICD-10-CM

## 2023-05-12 DIAGNOSIS — M1712 Unilateral primary osteoarthritis, left knee: Secondary | ICD-10-CM | POA: Diagnosis not present

## 2023-05-25 DIAGNOSIS — R0982 Postnasal drip: Secondary | ICD-10-CM | POA: Diagnosis not present

## 2023-05-25 DIAGNOSIS — J3489 Other specified disorders of nose and nasal sinuses: Secondary | ICD-10-CM | POA: Diagnosis not present

## 2023-05-25 DIAGNOSIS — R5383 Other fatigue: Secondary | ICD-10-CM | POA: Diagnosis not present

## 2023-05-25 DIAGNOSIS — R059 Cough, unspecified: Secondary | ICD-10-CM | POA: Diagnosis not present

## 2023-05-28 ENCOUNTER — Encounter (HOSPITAL_COMMUNITY): Payer: Self-pay

## 2023-05-28 ENCOUNTER — Ambulatory Visit (INDEPENDENT_AMBULATORY_CARE_PROVIDER_SITE_OTHER): Payer: Medicare Other

## 2023-05-28 ENCOUNTER — Ambulatory Visit (HOSPITAL_COMMUNITY)
Admission: EM | Admit: 2023-05-28 | Discharge: 2023-05-28 | Disposition: A | Discharge status: Home / Self Care | Payer: Medicare Other | Attending: Physician Assistant | Admitting: Physician Assistant

## 2023-05-28 DIAGNOSIS — Z8619 Personal history of other infectious and parasitic diseases: Secondary | ICD-10-CM

## 2023-05-28 DIAGNOSIS — J40 Bronchitis, not specified as acute or chronic: Secondary | ICD-10-CM | POA: Diagnosis not present

## 2023-05-28 DIAGNOSIS — R053 Chronic cough: Secondary | ICD-10-CM | POA: Diagnosis not present

## 2023-05-28 DIAGNOSIS — J329 Chronic sinusitis, unspecified: Secondary | ICD-10-CM

## 2023-05-28 MED ORDER — PREDNISONE 20 MG PO TABS: 20.0000 mg | ORAL_TABLET | Freq: Every day | ORAL | 0 refills | Status: AC

## 2023-05-28 NOTE — ED Provider Notes (Signed)
[facility]    CSN: [id number] Arrival date & time: 05/28/23  1109      History   Chief Complaint Chief Complaint  Patient presents with   Cough    HPI Nathan Gomez is a 82 y.o. male.   Patient presents today with a 10 to 11-day history of URI symptoms.  He reports cough, nasal congestion, sinus pressure.  Denies any fever, chest pain, shortness of breath, nausea, vomiting.  He did complete a telemedicine visit and was given benzonatate with some for improvement of symptoms.  He has been taking over-the-counter medicines as well with minimal improvement of symptoms.  He did recently travel to [place] to visit family but denies any known sick contacts.  He has had COVID in 2022 and is up-to-date on COVID-19 vaccinations.  He denies any recent antibiotics in the past 90 days.  Last antibiotic use was prolonged course of vancomycin to manage C. difficile.  Denies any recent steroids.  He denies history of asthma, COPD, smoking.  Because of allergies and has been compliant with over-the-counter antihistamines to manage the symptoms.    Past Medical History:  Diagnosis Date   Achilles tendinitis    Allergy    Cataract    Chronic kidney disease    Complication of anesthesia    urinary retention   Degenerative tear of left medial meniscus 11/2014   Diabetes mellitus, type 2 (HCC)    GERD (gastroesophageal reflux disease)    Glaucoma    Hyperlipidemia    Hypertension    Osteoarthritis    right knee   Pancreatic insufficiency    Personal history of colonic adenomas 01/16/2013   Plantar fasciitis    Prostate cancer [facility])     Patient Active Problem List   Diagnosis Date Noted   History of Clostridium difficile infection 05/06/2023   SOB (shortness of breath) 04/15/2022   First degree heart block 04/08/2022   Allergic rhinitis 10/06/2021   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/25/2021   Lumbar spondylosis 04/25/2021   History of total right hip replacement  04/25/2021   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/25/2021   Pancreatic insufficiency 03/31/2021   Irritable bowel syndrome with diarrhea 12/30/2020   Malignant neoplasm of prostate (HCC) 12/30/2020   Stage 3b chronic kidney disease (HCC) 05/01/2020   GERD (gastroesophageal reflux disease) 09/12/2017   Vitamin D deficiency 09/12/2017   Ruptured, tendon, Achilles, right, sequela 04/08/2017   Hypomagnesemia 04/08/2016   B12 deficiency 01/30/2016   Urinary retention 07/30/2015   Personal history of colonic adenomas 01/16/2013   Normocytic anemia 02/22/2012   Type 2 diabetes mellitus with stage 3b chronic kidney disease (HCC) 01/05/2011   Hyperlipidemia 01/05/2011   Essential hypertension 01/05/2011   Primary osteoarthritis of left knee 01/05/2011    Past Surgical History:  Procedure Laterality Date   CATARACT EXTRACTION  08/2012 L, 09/2012 R   COLONOSCOPY     PROSTATE BIOPSY     TONSILLECTOMY  1945   TOTAL HIP ARTHROPLASTY Left 2011   TOTAL HIP ARTHROPLASTY Right 07/09/2015   Procedure: TOTAL HIP ARTHROPLASTY ANTERIOR APPROACH;  Surgeon: Marcene Corning, MD;  Location: [facility] OR;  Service: Orthopedics;  Laterality: Right;   TOTAL KNEE ARTHROPLASTY Right 07/21/2016   Procedure: RIGHT TOTAL KNEE ARTHROPLASTY;  Surgeon: Marcene Corning, MD;  Location: [facility] OR;  Service: Orthopedics;  Laterality: Right;       Home Medications    Prior to Admission medications   Medication Sig Start Date  End Date Taking? Authorizing Provider  amLODipine (NORVASC) 10 MG tablet TAKE 1 TABLET DAILY 03/23/22  Yes Loyola Mast, MD  atorvastatin (LIPITOR) 40 MG tablet Take 1 tablet (40 mg total) by mouth daily. 05/06/23  Yes Loyola Mast, MD  Cholecalciferol (VITAMIN D) 2000 units CAPS Take 1 capsule (2,000 Units total) by mouth every morning. 12/03/17  Yes Opalski, Gavin Pound, DO  Cyanocobalamin (VITAMIN B12 PO) Take 1 tablet by mouth daily.   Yes [provider]  fluticasone (FLONASE) 50 MCG/ACT  nasal spray Place 1 spray into both nostrils 2 (two) times daily. 08/08/21  Yes [provider]  Boris Lown Oil 300 MG CAPS Take 500 mg by mouth daily.    Yes [provider]  lactobacillus acidophilus (BACID) TABS tablet Take 2 tablets by mouth 3 (three) times daily.   Yes [provider]  lipase/protease/amylase (CREON) 36000 UNITS CPEP capsule Take by mouth.   Yes [provider]  lisinopril (ZESTRIL) 20 MG tablet TAKE 1 TABLET DAILY 03/23/22  Yes Loyola Mast, MD  Magnesium 100 MG CAPS Take 100 mg by mouth 2 (two) times daily.   Yes [provider]  metFORMIN (GLUCOPHAGE-XR) 500 MG 24 hr tablet TAKE 2 TABLETS 2 TIMES     DAILY WITH MEALS 06/15/22  Yes Loyola Mast, MD  Multiple Vitamins-Minerals (SENIOR MULTIVITAMIN PLUS) TABS Take 1 tablet by mouth daily.   Yes [provider]  predniSONE (DELTASONE) 20 MG tablet Take 1 tablet (20 mg total) by mouth daily for 4 days. 05/28/23 06/01/23 Yes Isa Kohlenberg K, PA-C  psyllium (METAMUCIL) 58.6 % powder Take 1 packet by mouth 3 (three) times daily.   Yes [provider]  tamsulosin (FLOMAX) 0.4 MG CAPS capsule Take 1 capsule (0.4 mg total) by mouth at bedtime. Patient taking differently: Take 0.4-0.8 mg by mouth See admin instructions. Take 0.4 mg by mouth daily. Starting 7 days before procedure take 0.8mg  by mouth daily 03/27/16  Yes Burns, Bobette Mo, MD  blood glucose meter kit and supplies KIT Use to test blood sugar up to twice a day. DX E11.09 06/20/20   Mliss Sax, MD  Continuous Blood Gluc Receiver (FREESTYLE LIBRE 14 DAY READER) DEVI 1 Device by Does not apply route as directed. 05/28/20   Mliss Sax, MD  glucose blood test strip Use as instructed to test blood sugar up to twice a day. DX:  E11.09 07/30/15   Newt Lukes, MD  Lancets 30G MISC Delica Fine Lancets. Use to test blood sugar up to twice a day. DX E11.09 07/30/15   Newt Lukes, MD    Family  History Family History  Problem Relation Age of Onset   Cancer - Ovarian Mother    Heart disease Father 51       Died age 20   Hypertension Father    Ovarian cancer Sister    Diabetes Maternal Grandfather    Pneumonia Paternal Grandfather    Heart disease Paternal Uncle    Brain cancer Cousin    Colon cancer Neg Hx    Esophageal cancer Neg Hx    Liver cancer Neg Hx    Pancreatic cancer Neg Hx    Rectal cancer Neg Hx    Stomach cancer Neg Hx     Social History Social History   Tobacco Use   Smoking status: Former    Current packs/day: 0.00    Average packs/day: 1 pack/day for 20.0 years (20.0 ttl pk-yrs)  Types: Cigarettes    Start date: 11/16/1966    Quit date: 11/16/1986    Years since quitting: 36.5   Smokeless tobacco: Never  Vaping Use   Vaping status: Never Used  Substance Use Topics   Alcohol use: Not Currently    Alcohol/week: 1.0 - 2.0 standard drink of alcohol    Types: 1 - 2 Glasses of wine per week    Comment: occ   Drug use: No     Allergies   Nsaids, Sulfa antibiotics, and Thiazide-type diuretics   Review of Systems Review of Systems  Constitutional:  Positive for activity change. Negative for appetite change, fatigue and fever.  HENT:  Positive for congestion, postnasal drip and sinus pressure. Negative for sneezing and sore throat.   Respiratory:  Positive for cough. Negative for shortness of breath.   Cardiovascular:  Negative for chest pain.  Gastrointestinal:  Negative for abdominal pain, diarrhea, nausea and vomiting.  Neurological:  Negative for dizziness, light-headedness and headaches.     Physical Exam Triage Vital Signs ED Triage Vitals [05/28/23 1211]  Encounter Vitals Group     BP 120/80     Systolic BP Percentile      Diastolic BP Percentile      Pulse Rate 84     Resp 16     Temp 97.8 F (36.6 C)     Temp Source Oral     SpO2 96 %     Weight 190 lb (86.2 kg)     Height 6' (1.829 m)     Head Circumference      Peak  Flow      Pain Score 0     Pain Loc      Pain Education      Exclude from Growth Chart    No data found.  Updated Vital Signs BP 120/80 (BP Location: Right Arm)   Pulse 84   Temp 97.8 F (36.6 C) (Oral)   Resp 16   Ht 6' (1.829 m)   Wt 190 lb (86.2 kg)   SpO2 96%   BMI 25.77 kg/m   Visual Acuity Right Eye Distance:   Left Eye Distance:   Bilateral Distance:    Right Eye Near:   Left Eye Near:    Bilateral Near:     Physical Exam Vitals reviewed.  Constitutional:      General: He is awake.     Appearance: Normal appearance. He is well-developed. He is not ill-appearing.     Comments: Very pleasant male appears stated age in no acute distress sitting comfortably in exam room  HENT:     Head: Normocephalic and atraumatic.     Right Ear: Tympanic membrane, ear canal and external ear normal. Tympanic membrane is not erythematous or bulging.     Left Ear: Tympanic membrane, ear canal and external ear normal. Tympanic membrane is not erythematous or bulging.     Nose: Nose normal.     Right Sinus: No maxillary sinus tenderness or frontal sinus tenderness.     Left Sinus: No maxillary sinus tenderness or frontal sinus tenderness.     Mouth/Throat:     Pharynx: Uvula midline. No oropharyngeal exudate or posterior oropharyngeal erythema.     Comments: Drainage present posterior oropharynx Cardiovascular:     Rate and Rhythm: Normal rate and regular rhythm.     Heart sounds: Normal heart sounds, S1 normal and S2 normal. No murmur heard. Pulmonary:     Effort: Pulmonary effort is  normal. No accessory muscle usage or respiratory distress.     Breath sounds: No stridor. Examination of the left-lower field reveals decreased breath sounds. Decreased breath sounds present. No wheezing, rhonchi or rales.  Neurological:     Mental Status: He is alert.  Psychiatric:        Behavior: Behavior is cooperative.      UC Treatments / Results  Labs (all labs ordered are listed, but  only abnormal results are displayed) Labs Reviewed - No data to display  EKG   Radiology DG Chest 2 View  Result Date: 05/28/2023 CLINICAL DATA:  Persistent cough. EXAM: CHEST - 2 VIEW COMPARISON:  Chest x-ray dated 07/10/2016 FINDINGS: Heart size and mediastinal contours are stable. Lungs are clear. No pleural effusion or pneumothorax is seen. No acute-appearing osseous abnormality. IMPRESSION: No active cardiopulmonary disease. No evidence of pneumonia or pulmonary edema. Electronically Signed   By: Bary Rena M.D.   On: 05/28/2023 13:35    Procedures Procedures (including critical care time)  Medications Ordered in UC Medications - No data to display  Initial Impression / Assessment and Plan / UC Course  I have reviewed the triage vital signs and the nursing notes.  Pertinent labs & imaging results that were available during my care of the patient were reviewed by me and considered in my medical decision making (see chart for details).     Patient is well-appearing, afebrile, nontoxic, nontachycardic.  Chest x-ray was obtained given prolonged and worsening symptoms that showed no acute cardiopulmonary disease.  Viral testing was deferred as patient has been symptomatic for several weeks and this would not change our management.  We did discuss potential utility of starting antibiotic to cover for sinusitis/sinobronchitis, however, given mild symptoms we deferred antibiotics due to concern for recurrent C. difficile recently was treated and recovered from this infection.  Discussed that if symptoms or not improving we can consider antibiotic use but I would recommend following up with GI/infectious disease to consider prophylactic vancomycin or other medication to prevent recurrent C. difficile.  Will treat with prednisone burst of 20 mg for 4 days.  He does have a history of diabetes but his blood sugars are out of control with his last A1c in June 2024 7.0%.  He was encouraged to push  fluids and avoid carbohydrates while on this medication.  He can continue over-the-counter medication for additional symptom relief discussed that if he has any worsening or changing symptoms including worsening cough, shortness of breath, fever, weakness, nausea, vomiting he needs to be seen immediately.  Recommend close follow-up with primary care.  Strict return precautions given.  Final Clinical Impressions(s) / UC Diagnoses   Final diagnoses:  Sinobronchitis  History of Clostridioides difficile colitis     Discharge Instructions      Your x-ray was normal with no evidence of pneumonia.  Continue over-the-counter medication including allergy medicine.  Use nasal saline and sinus rinses for additional symptom relief.  Start prednisone 20 mg for 4 days.  This can raise your blood sugars to make sure that you drink plenty of fluid and avoid carbohydrates.  Do not take NSAIDs including aspirin, ibuprofen/Advil, naproxen/Aleve while on this medication.  Follow-up with your primary care if your symptoms have not resolved by next week.  If anything worsens and you have worsening cough, shortness of breath, fever, nausea, vomiting you need to be seen immediately.     ED Prescriptions     Medication Sig Dispense Auth. Provider  predniSONE (DELTASONE) 20 MG tablet Take 1 tablet (20 mg total) by mouth daily for 4 days. 4 tablet Velencia Lenart, Noberto Retort, PA-C      PDMP not reviewed this encounter.   Jeani Hawking, PA-C 05/28/23 1414

## 2023-05-28 NOTE — Discharge Instructions (Signed)
Your x-ray was normal with no evidence of pneumonia.  Continue over-the-counter medication including allergy medicine.  Use nasal saline and sinus rinses for additional symptom relief.  Start prednisone 20 mg for 4 days.  This can raise your blood sugars to make sure that you drink plenty of fluid and avoid carbohydrates.  Do not take NSAIDs including aspirin, ibuprofen/Advil, naproxen/Aleve while on this medication.  Follow-up with your primary care if your symptoms have not resolved by next week.  If anything worsens and you have worsening cough, shortness of breath, fever, nausea, vomiting you need to be seen immediately.

## 2023-05-28 NOTE — ED Triage Notes (Signed)
Patient here today with c/o cough, congestion, and nasal drainage X 10 days. Cough is worse at night while lying down. He has taken Benzonatate and Dristan Nasal spray with some relief. No fever. He was recently in [place] on Vacation with some family and friends. Some of them had some sinus symptoms as well but they improved.

## 2023-05-31 ENCOUNTER — Encounter: Payer: Self-pay | Admitting: Podiatry

## 2023-05-31 ENCOUNTER — Ambulatory Visit (INDEPENDENT_AMBULATORY_CARE_PROVIDER_SITE_OTHER): Payer: PRIVATE HEALTH INSURANCE | Admitting: Podiatry

## 2023-05-31 DIAGNOSIS — B351 Tinea unguium: Secondary | ICD-10-CM | POA: Diagnosis not present

## 2023-05-31 DIAGNOSIS — N1832 Chronic kidney disease, stage 3b: Secondary | ICD-10-CM

## 2023-05-31 DIAGNOSIS — E1122 Type 2 diabetes mellitus with diabetic chronic kidney disease: Secondary | ICD-10-CM | POA: Diagnosis not present

## 2023-05-31 DIAGNOSIS — L84 Corns and callosities: Secondary | ICD-10-CM

## 2023-05-31 DIAGNOSIS — M79674 Pain in right toe(s): Secondary | ICD-10-CM

## 2023-05-31 DIAGNOSIS — M79675 Pain in left toe(s): Secondary | ICD-10-CM

## 2023-05-31 NOTE — Progress Notes (Signed)
  Subjective:  Patient ID: Nathan Gomez, male    DOB: 24-Oct-1941,   MRN: [id number]  Chief Complaint  Patient presents with   Nail Problem    [facility]    82 y.o. male presents for concern of thickened elongated and painful nails that are difficult to trim. Requesting to have them trimmed today. Also has concern for callous on the outside of his left foot. Denies burning and tingling in their feet. Patient is diabetic and last A1c was  Lab Results  Component Value Date   HGBA1C 7.0 (H) 05/06/2023   .   PCP:  Loyola Mast, MD    . Denies any other pedal complaints. Denies n/v/f/c.   Past Medical History:  Diagnosis Date   Achilles tendinitis    Allergy    Cataract    Chronic kidney disease    Complication of anesthesia    urinary retention   Degenerative tear of left medial meniscus 11/2014   Diabetes mellitus, type 2 (HCC)    GERD (gastroesophageal reflux disease)    Glaucoma    Hyperlipidemia    Hypertension    Osteoarthritis    right knee   Pancreatic insufficiency    Personal history of colonic adenomas 01/16/2013   Plantar fasciitis    Prostate cancer (HCC)     Objective:  Physical Exam: Vascular: DP/PT pulses 2/4 bilateral. CFT <3 seconds. Absent hair growth on digits. Edema noted to bilateral lower extremities. Xerosis noted bilaterally.  Skin. No lacerations or abrasions bilateral feet. Nails 1-5 bilateral  are thickened discolored and elongated with subungual debris. Hyperkeartotic lesion noted to lateral left heel.  Musculoskeletal: MMT 5/5 bilateral lower extremities in DF, PF, Inversion and Eversion. Deceased ROM in DF of ankle joint.  Neurological: Sensation intact to light touch. Protective sensation intact bilateral.    Assessment:   1. Pain due to onychomycosis of toenails of both feet   2. Corn   3. Type 2 diabetes mellitus with stage 3b chronic kidney disease, without long-term current use of insulin (HCC)        Plan:  Patient was evaluated  and treated and all questions answered. -Discussed and educated patient on diabetic foot care, especially with  regards to the vascular, neurological and musculoskeletal systems.  -Stressed the importance of good glycemic control and the detriment of not  controlling glucose levels in relation to the foot. -Discussed supportive shoes at all times and checking feet regularly.  -Mechanically debrided all nails 1-5 bilateral using sterile nail nipper and filed with dremel without incident -Hyperkeratotic tissue debrided without issue with chisel.   -Answered all patient questions -Patient to return  in 3 months for at risk foot care -Patient advised to call the office if any problems or questions arise in the meantime.   Louann Sjogren, DPM

## 2023-06-01 LAB — LIPID PANEL
Cholesterol: 159 (ref 0–200)
HDL: 34 — AB (ref 35–70)
LDL Cholesterol: 90
Triglycerides: 326 — AB (ref 40–160)

## 2023-06-01 LAB — HEMOGLOBIN A1C: Hemoglobin A1C: 6.5

## 2023-06-02 ENCOUNTER — Encounter: Payer: Self-pay | Admitting: Family Medicine

## 2023-06-07 ENCOUNTER — Encounter: Payer: Self-pay | Admitting: Family Medicine

## 2023-06-07 ENCOUNTER — Ambulatory Visit (INDEPENDENT_AMBULATORY_CARE_PROVIDER_SITE_OTHER): Payer: Medicare Other | Admitting: Family Medicine

## 2023-06-07 VITALS — BP 114/68 | HR 83 | Temp 98.8°F | Ht 72.0 in | Wt 190.8 lb

## 2023-06-07 DIAGNOSIS — J40 Bronchitis, not specified as acute or chronic: Secondary | ICD-10-CM | POA: Diagnosis not present

## 2023-06-07 MED ORDER — BENZONATATE 200 MG PO CAPS: 400.0000 mg | ORAL_CAPSULE | Freq: Three times a day (TID) | ORAL | 0 refills | Status: DC | PRN

## 2023-06-07 NOTE — Progress Notes (Signed)
[facility] [address] [place] [address] Dept: [phone] Dept Fax: [phone]  Office Visit  Subjective:    Patient ID: Nathan Gomez, male    DOB: 1941-01-11, 82 y.o..   MRN: [id number]  Chief Complaint  Patient presents with   Cough    C/o having a lingering cough x 3 weeks.  Has been taking Benzonatate caps and over the counter cough meds.     History of Present Illness:  Patient is in today for reassessment of a cough he has had for several weeks. Nathan Gomez notes he had been on a trip to [place], [place] with his family, as he had grandchildren playing in a basketball tournament. He was very busy while there. He developed an issue with a cough. He did a virtual visit on his drive back and was prescribed some Tessalon. After returning home, he was seen at Urgent Care on 7/12. He was felt to have some sinobronchitis. He was prescribed a 4-day course of prednisone. He notes that though he took this, he couldn't tell that this really helped him. He has had waxing and waning symptoms wince that time. He is no longer coughing up any sputum, which had been clear even when he was.  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/2021   Pancreatic insufficiency 03/31/2021   Irritable bowel syndrome with diarrhea 12/30/2020   Malignant neoplasm of prostate (HCC) 12/30/2020   Stage 3b chronic kidney disease (HCC) 05/01/2020   GERD (gastroesophageal reflux disease) 09/12/2017   Vitamin D deficiency 09/12/2017   Ruptured, tendon, Achilles, right, sequela  04/08/2017   Hypomagnesemia 04/08/2016   B12 deficiency 01/30/2016   Urinary retention 07/30/2015   Personal history of colonic adenomas 01/16/2013   Normocytic anemia 02/22/2012   Type 2 diabetes mellitus with stage 3b chronic kidney disease (HCC) 01/05/2011   Hyperlipidemia 01/05/2011   Essential hypertension 01/05/2011   Primary osteoarthritis of left knee 01/05/2011   Past Surgical History:  Procedure Laterality Date   CATARACT EXTRACTION  08/2012 L, 09/2012 R   COLONOSCOPY     PROSTATE BIOPSY     TONSILLECTOMY  1945   TOTAL HIP ARTHROPLASTY Left 2011   TOTAL HIP ARTHROPLASTY Right 07/09/2015   Procedure: TOTAL HIP ARTHROPLASTY ANTERIOR APPROACH;  Surgeon: Marcene Corning, MD;  Location: [facility] OR;  Service: Orthopedics;  Laterality: Right;   TOTAL KNEE ARTHROPLASTY Right 07/21/2016   Procedure: RIGHT TOTAL KNEE ARTHROPLASTY;  Surgeon: Marcene Corning, MD;  Location: [facility] OR;  Service: Orthopedics;  Laterality: Right;   Family History  Problem Relation Age of Onset   Cancer - Ovarian Mother    Heart disease Father 15       Died age 45   Hypertension Father    Ovarian cancer Sister    Diabetes Maternal Grandfather    Pneumonia Paternal Grandfather    Heart disease Paternal Uncle    Brain cancer Cousin    Colon cancer Neg Hx    Esophageal cancer Neg Hx    Liver cancer Neg Hx    Pancreatic cancer Neg Hx    Rectal  cancer Neg Hx    Stomach cancer Neg Hx    Outpatient Medications Prior to Visit  Medication Sig Dispense Refill   amLODipine (NORVASC) 10 MG tablet TAKE 1 TABLET DAILY 90 tablet 3   atorvastatin (LIPITOR) 40 MG tablet Take 1 tablet (40 mg total) by mouth daily. 90 tablet 3   blood glucose meter kit and supplies KIT Use to test blood sugar up to twice a day. DX E11.09 1 each 0   Cholecalciferol (VITAMIN D) 2000 units CAPS Take 1 capsule (2,000 Units total) by mouth every morning. 90 capsule 3   Continuous Blood Gluc Receiver (FREESTYLE LIBRE 14 DAY READER) DEVI 1  Device by Does not apply route as directed. 1 each 2   Cyanocobalamin (VITAMIN B12 PO) Take 1 tablet by mouth daily.     fluticasone (FLONASE) 50 MCG/ACT nasal spray Place 1 spray into both nostrils 2 (two) times daily.     glucose blood test strip Use as instructed to test blood sugar up to twice a day. DX:  E11.09 200 each 3   Krill Oil 300 MG CAPS Take 500 mg by mouth daily.      lactobacillus acidophilus (BACID) TABS tablet Take 2 tablets by mouth 3 (three) times daily.     Lancets 30G MISC Delica Fine Lancets. Use to test blood sugar up to twice a day. DX E11.09 200 each 3   lipase/protease/amylase (CREON) 36000 UNITS CPEP capsule Take by mouth.     lisinopril (ZESTRIL) 20 MG tablet TAKE 1 TABLET DAILY 90 tablet 3   Magnesium 100 MG CAPS Take 100 mg by mouth 2 (two) times daily.     metFORMIN (GLUCOPHAGE-XR) 500 MG 24 hr tablet TAKE 2 TABLETS 2 TIMES     DAILY WITH MEALS 360 tablet 1   Multiple Vitamins-Minerals (SENIOR MULTIVITAMIN PLUS) TABS Take 1 tablet by mouth daily.     psyllium (METAMUCIL) 58.6 % powder Take 1 packet by mouth 3 (three) times daily.     tamsulosin (FLOMAX) 0.4 MG CAPS capsule Take 1 capsule (0.4 mg total) by mouth at bedtime. (Patient taking differently: Take 0.4-0.8 mg by mouth See admin instructions. Take 0.4 mg by mouth daily. Starting 7 days before procedure take 0.8mg  by mouth daily) 14 capsule 0   benzonatate (TESSALON) 200 MG capsule Take 400 mg by mouth every 8 (eight) hours as needed.     No facility-administered medications prior to visit.   Allergies  Allergen Reactions   Nsaids     Kidney Disease   Sulfa Antibiotics Other (See Comments)    As child    Not sure rxn   Thiazide-Type Diuretics Other (See Comments)    Unknown     Objective:   Today's Vitals   06/07/23 1303  BP: 114/68  Pulse: 83  Temp: 98.8 F (37.1 C)  TempSrc: Temporal  SpO2: 99%  Weight: 190 lb 12.8 oz (86.5 kg)  Height: 6' (1.829 m)   Body mass index is 25.88 kg/m.    General: Well developed, well nourished. No acute distress. HEENT: Normocephalic, non-traumatic. PERRL, EOMI. Conjunctiva clear. External ears normal. EAC and TMs   normal bilaterally. Nose clear without congestion or rhinorrhea. Mucous membranes moist. Oropharynx clear. Good   dentition. Neck: Supple. No lymphadenopathy. No thyromegaly. Lungs: Clear to auscultation bilaterally. No wheezing, rales or rhonchi. CV: RRR without murmurs or rubs. Pulses 2+ bilaterally. Psych: Alert and oriented. Normal mood and affect.  Health Maintenance Due  Topic Date Due  Medicare Annual Wellness (AWV)  07/09/2023     Imaging: Chest x-ray (05/28/2023) IMPRESSION: No active cardiopulmonary disease. No evidence of pneumonia or pulmonary edema.  Assessment & Plan:   Problem List Items Addressed This Visit       Respiratory   Bronchitis - Primary    Discussed home care for viral illness, including rest, pushing fluids, and OTC medications as needed for symptom relief. Recommend hot tea with honey for sore throat symptoms. Follow-up if needed for worsening or persistent symptoms.       Relevant Medications   benzonatate (TESSALON) 200 MG capsule    Return if symptoms worsen or fail to improve.   Loyola Mast, MD

## 2023-06-07 NOTE — Assessment & Plan Note (Signed)
Discussed home care for viral illness, including rest, pushing fluids, and OTC medications as needed for symptom relief. Recommend hot tea with honey for sore throat symptoms. Follow-up if needed for worsening or persistent symptoms.  

## 2023-07-01 ENCOUNTER — Encounter (INDEPENDENT_AMBULATORY_CARE_PROVIDER_SITE_OTHER): Payer: Self-pay

## 2023-07-07 ENCOUNTER — Other Ambulatory Visit: Payer: Self-pay | Admitting: Oncology

## 2023-07-07 DIAGNOSIS — Z006 Encounter for examination for normal comparison and control in clinical research program: Secondary | ICD-10-CM

## 2023-07-12 ENCOUNTER — Ambulatory Visit (INDEPENDENT_AMBULATORY_CARE_PROVIDER_SITE_OTHER): Payer: Medicare Other

## 2023-07-12 DIAGNOSIS — Z0000 Encounter for general adult medical examination without abnormal findings: Secondary | ICD-10-CM | POA: Diagnosis not present

## 2023-07-12 NOTE — Patient Instructions (Signed)
Nathan Gomez , Thank you for taking time to come for your Medicare Wellness Visit. I appreciate your ongoing commitment to your health goals. Please review the following plan we discussed and let me know if I can assist you in the future.   Referrals/Orders/Follow-Ups/Clinician Recommendations: none  This is a list of the screening recommended for you and due dates:  Health Maintenance  Topic Date Due   Flu Shot  06/17/2023   Eye exam for diabetics  10/02/2023   Complete foot exam   11/05/2023   Hemoglobin A1C  11/05/2023   Yearly kidney health urinalysis for diabetes  02/03/2024   Yearly kidney function blood test for diabetes  05/05/2024   Medicare Annual Wellness Visit  07/11/2024   Colon Cancer Screening  01/16/2026   DTaP/Tdap/Td vaccine (4 - Td or Tdap) 07/10/2032   Pneumonia Vaccine  Completed   Zoster (Shingles) Vaccine  Completed   HPV Vaccine  Aged Out   COVID-19 Vaccine  Discontinued    Advanced directives: (In Chart) A copy of your advanced directives are scanned into your chart should your provider ever need it.  Next Medicare Annual Wellness Visit scheduled for next year: Yes  insert Preventive Care attachment Insert FALL PREVENTION attachment if needed

## 2023-07-12 NOTE — Progress Notes (Signed)
Subjective:   Nathan Gomez is a 82 y.o. male who presents for Medicare Annual/Subsequent preventive examination.  Visit Complete: Virtual  I connected with  Forde Radon on 07/12/23 by a audio enabled telemedicine application and verified that I am speaking with the correct person using two identifiers.  Patient Location: Home  Provider Location: Office/Clinic  I discussed the limitations of evaluation and management by telemedicine. The patient expressed understanding and agreed to proceed.  Vital Signs: Unable to obtain new vitals due to this being a telehealth visit.  Review of Systems     Cardiac Risk Factors include: advanced age (>17men, >43 women);diabetes mellitus;dyslipidemia;hypertension;male gender     Objective:    Today's Vitals   There is no height or weight on file to calculate BMI.     07/12/2023   11:16 AM 07/08/2022   11:39 AM 07/14/2021    9:57 AM 06/17/2021    8:25 AM 03/18/2021   10:23 AM 06/05/2020    9:54 AM 05/13/2017    1:29 PM  Advanced Directives  Does Patient Have a Medical Advance Directive? Yes Yes Yes Yes Yes Yes Yes  Type of Estate agent of [place];Living will Healthcare Power of [place];Living will  Healthcare Power of [place];Living will Healthcare Power of [place];Living will Healthcare Power of [place];Living will   Does patient want to make changes to medical advance directive?   No - Patient declined  No - Patient declined    Copy of Healthcare Power of Attorney in Chart? Yes - validated most recent copy scanned in chart (See row information) No - copy requested  No - copy requested No - copy requested Yes - validated most recent copy scanned in chart (See row information)     Current Medications (verified) Outpatient Encounter Medications as of 07/12/2023  Medication Sig   amLODipine (NORVASC) 10 MG tablet TAKE 1 TABLET DAILY   atorvastatin (LIPITOR) 40 MG tablet Take 1 tablet (40 mg total) by mouth  daily.   benzonatate (TESSALON) 200 MG capsule Take 2 capsules (400 mg total) by mouth every 8 (eight) hours as needed.   blood glucose meter kit and supplies KIT Use to test blood sugar up to twice a day. DX E11.09   Cholecalciferol (VITAMIN D) 2000 units CAPS Take 1 capsule (2,000 Units total) by mouth every morning.   Continuous Blood Gluc Receiver (FREESTYLE LIBRE 14 DAY READER) DEVI 1 Device by Does not apply route as directed.   Cyanocobalamin (VITAMIN B12 PO) Take 1 tablet by mouth daily.   fluticasone (FLONASE) 50 MCG/ACT nasal spray Place 1 spray into both nostrils 2 (two) times daily.   glucose blood test strip Use as instructed to test blood sugar up to twice a day. DX:  E11.09   Krill Oil 300 MG CAPS Take 500 mg by mouth daily.    lactobacillus acidophilus (BACID) TABS tablet Take 2 tablets by mouth 3 (three) times daily.   Lancets 30G MISC Delica Fine Lancets. Use to test blood sugar up to twice a day. DX E11.09   lipase/protease/amylase (CREON) 36000 UNITS CPEP capsule Take by mouth.   lisinopril (ZESTRIL) 20 MG tablet TAKE 1 TABLET DAILY   Magnesium 100 MG CAPS Take 100 mg by mouth 2 (two) times daily.   metFORMIN (GLUCOPHAGE-XR) 500 MG 24 hr tablet TAKE 2 TABLETS 2 TIMES     DAILY WITH MEALS   Multiple Vitamins-Minerals (SENIOR MULTIVITAMIN PLUS) TABS Take 1 tablet by mouth daily.   psyllium (  METAMUCIL) 58.6 % powder Take 1 packet by mouth 3 (three) times daily.   tamsulosin (FLOMAX) 0.4 MG CAPS capsule Take 1 capsule (0.4 mg total) by mouth at bedtime. (Patient taking differently: Take 0.4-0.8 mg by mouth See admin instructions. Take 0.4 mg by mouth daily. Starting 7 days before procedure take 0.8mg  by mouth daily)   No facility-administered encounter medications on file as of 07/12/2023.    Allergies (verified) Nsaids, Sulfa antibiotics, and Thiazide-type diuretics   History: Past Medical History:  Diagnosis Date   Achilles tendinitis    Allergy    Cataract    Chronic  kidney disease    Complication of anesthesia    urinary retention   Degenerative tear of left medial meniscus 11/2014   Diabetes mellitus, type 2 (HCC)    GERD (gastroesophageal reflux disease)    Glaucoma    Hyperlipidemia    Hypertension    Osteoarthritis    right knee   Pancreatic insufficiency    Personal history of colonic adenomas 01/16/2013   Plantar fasciitis    Prostate cancer [facility])    Past Surgical History:  Procedure Laterality Date   CATARACT EXTRACTION  08/2012 L, 09/2012 R   COLONOSCOPY     PROSTATE BIOPSY     TONSILLECTOMY  1945   TOTAL HIP ARTHROPLASTY Left 2011   TOTAL HIP ARTHROPLASTY Right 07/09/2015   Procedure: TOTAL HIP ARTHROPLASTY ANTERIOR APPROACH;  Surgeon: Marcene Corning, MD;  Location: [facility] OR;  Service: Orthopedics;  Laterality: Right;   TOTAL KNEE ARTHROPLASTY Right 07/21/2016   Procedure: RIGHT TOTAL KNEE ARTHROPLASTY;  Surgeon: Marcene Corning, MD;  Location: [facility] OR;  Service: Orthopedics;  Laterality: Right;   Family History  Problem Relation Age of Onset   Cancer - Ovarian Mother    Heart disease Father 11       Died age 31   Hypertension Father    Ovarian cancer Sister    Diabetes Maternal Grandfather    Pneumonia Paternal Grandfather    Heart disease Paternal Uncle    Brain cancer Cousin    Colon cancer Neg Hx    Esophageal cancer Neg Hx    Liver cancer Neg Hx    Pancreatic cancer Neg Hx    Rectal cancer Neg Hx    Stomach cancer Neg Hx    Social History   Socioeconomic History   Marital status: Married    Spouse name: Not on file   Number of children: 3   Years of education: Not on file   Highest education level: Not on file  Occupational History   Occupation: Retired    Associate Professor: [employer]    Comment: Pharmacist, hospital  Tobacco Use   Smoking status: Former    Current packs/day: 0.00    Average packs/day: 1 pack/day for 20.0 years (20.0 ttl pk-yrs)    Types: Cigarettes    Start date: 11/16/1966    Quit date: 11/16/1986    Years  since quitting: 36.6   Smokeless tobacco: Never  Vaping Use   Vaping status: Never Used  Substance and Sexual Activity   Alcohol use: Not Currently    Alcohol/week: 1.0 - 2.0 standard drink of alcohol    Types: 1 - 2 Glasses of wine per week    Comment: occ   Drug use: No   Sexual activity: Yes  Other Topics Concern   Not on file  Social History Narrative   Wife is from [place].    Served 4 years in [place]  [employer] ([place] era[place]   Three children   Five grands.    Social Determinants of Health   Financial Resource Strain: Low Risk  (07/12/2023)   Overall Financial Resource Strain (CARDIA)    Difficulty of Paying Living Expenses: Not hard at all  Food Insecurity: No Food Insecurity (07/12/2023)   Hunger Vital Sign    Worried About Running Out of Food in the Last Year: Never true    Ran Out of Food in the Last Year: Never true  Transportation Needs: No Transportation Needs (07/12/2023)   PRAPARE - Administrator, Civil Service (Medical): No    Lack of Transportation (Non-Medical): No  Physical Activity: Sufficiently Active (07/12/2023)   Exercise Vital Sign    Days of Exercise per Week: 7 days    Minutes of Exercise per Session: 60 min  Stress: No Stress Concern Present (07/12/2023)   [employer] of Occupational Health - Occupational Stress Questionnaire    Feeling of Stress : Not at all  Social Connections: Moderately Integrated (07/12/2023)   Social Connection and Isolation Panel [NHANES]    Frequency of Communication with Friends and Family: Three times a week    Frequency of Social Gatherings with Friends and Family: Twice a week    Attends Religious Services: More than 4 times per year    Active Member of [employer] or Organizations: No    Attends Engineer, structural: Never    Marital Status: Married    Tobacco Counseling Counseling given: Not Answered   Clinical Intake:  Pre-visit preparation completed: Yes  Pain : No/denies pain     Nutritional  Risks: None Diabetes: Yes CBG done?: No Did pt. bring in CBG monitor from home?: No  How often do you need to have someone help you when you read instructions, pamphlets, or other written materials from your doctor or pharmacy?: 1 - Never  Interpreter Needed?: No  Information entered by :: NAllen LPN   Activities of Daily Living    07/12/2023   11:12 AM  In your present state of health, do you have any difficulty performing the following activities:  Hearing? 0  Vision? 0  Difficulty concentrating or making decisions? 0  Walking or climbing stairs? 0  Dressing or bathing? 0  Doing errands, shopping? 0  Preparing Food and eating ? N  Using the Toilet? N  In the past six months, have you accidently leaked urine? Y  Comment couple times  Do you have problems with loss of bowel control? N  Managing your Medications? N  Managing your Finances? N  Housekeeping or managing your Housekeeping? N    Patient Care Team: Loyola Mast, MD as PCP - General (Family Medicine) Mia Creek, MD (Ophthalmology) Marcene Corning, MD (Orthopedic Surgery) Antony Contras, MD as Consulting Physician (Ophthalmology) Janalyn Harder, MD (Inactive) as Consulting Physician (Dermatology) Noel Christmas, MD as Consulting Physician (Urology) Bernette Redbird, MD as Consulting Physician (Gastroenterology)  Indicate any recent Medical Services you may have received from other than [facility] providers in the past year (date may be approximate).     Assessment:   This is a routine wellness examination for Jesstin.  Hearing/Vision screen Hearing Screening - Comments:: Denies hearing issues Vision Screening - Comments:: Regular eye exams, [facility]  Dietary issues and exercise activities discussed:     Goals Addressed             This Visit's Progress    Patient Stated  07/12/2023, stay alive       Depression Screen    07/12/2023   11:17 AM 06/07/2023    1:08 PM 05/06/2023     8:04 AM 07/08/2022   11:40 AM 01/06/2022    9:05 AM 08/29/2021    3:36 PM 06/17/2021    8:33 AM  PHQ 2/9 Scores  PHQ - 2 Score 0 1 0 0 0 0 0    Fall Risk    07/12/2023   11:17 AM 06/07/2023    1:08 PM 05/06/2023    8:04 AM 02/03/2023    8:00 AM 07/08/2022   11:40 AM  Fall Risk   Falls in the past year? 0 0 0 0 0  Number falls in past yr: 0 0 0 0 0  Injury with Fall? 0 0 0 0 0  Risk for fall due to : Medication side effect No Fall Risks No Fall Risks No Fall Risks   Follow up Falls evaluation completed;Falls prevention discussed Falls evaluation completed Falls evaluation completed Falls evaluation completed Falls evaluation completed;Education provided    MEDICARE RISK AT HOME: Medicare Risk at Home Any stairs in or around the home?: Yes If so, are there any without handrails?: No Home free of loose throw rugs in walkways, pet beds, electrical cords, etc?: Yes Adequate lighting in your home to reduce risk of falls?: Yes Life alert?: No Use of a cane, walker or w/c?: No Grab bars in the bathroom?: No Shower chair or bench in shower?: Yes Elevated toilet seat or a handicapped toilet?: Yes  TIMED UP AND GO:  Was the test performed?  No    Cognitive Function:        07/12/2023   11:17 AM 05/13/2017    1:35 PM  6CIT Screen  What Year? 0 points 0 points  What month? 0 points 0 points  What time? 0 points 0 points  Count back from 20 0 points 0 points  Months in reverse 0 points 0 points  Repeat phrase 2 points 10 points  Total Score 2 points 10 points    Immunizations Immunization History  Administered Date(s) Administered   Fluad Quad(high Dose 65+) 08/15/2019   Hepatitis A 09/15/1996   Influenza Split 09/28/2011, 07/23/2022   Influenza, High Dose Seasonal PF 07/12/2014, 07/30/2015, 08/25/2016, 09/13/2017, 08/12/2018   Influenza, Seasonal, Injecte, Preservative Fre 07/17/2013   Influenza-Unspecified 09/10/2021   Meningococcal Conjugate 09/15/1996   PFIZER(Purple  Top)SARS-COV-2 Vaccination 12/23/2019, 01/17/2020, 08/13/2020   Pfizer Covid-19 Vaccine Bivalent Booster 18yrs & up 03/10/2021   Pneumococcal Conjugate-13 01/29/2015   Pneumococcal Polysaccharide-23 08/01/2007, 09/16/2008   Rsv, Bivalent, Protein Subunit Rsvpref,pf Verdis Frederickson) 07/23/2022   Td 05/26/2012   Tdap 04/16/2006, 07/10/2022   Typhoid Live 05/13/2017, 10/22/2017   Zoster Recombinant(Shingrix) 05/13/2017, 10/22/2017   Zoster, Live 03/01/2009    TDAP status: Up to date  Flu Vaccine status: Due, Education has been provided regarding the importance of this vaccine. Advised may receive this vaccine at local pharmacy or Health Dept. Aware to provide a copy of the vaccination record if obtained from local pharmacy or Health Dept. Verbalized acceptance and understanding.  Pneumococcal vaccine status: Up to date  Covid-19 vaccine status: Completed vaccines  Qualifies for Shingles Vaccine? Yes   Zostavax completed Yes   Shingrix Completed?: Yes  Screening Tests Health Maintenance  Topic Date Due   INFLUENZA VACCINE  06/17/2023   OPHTHALMOLOGY EXAM  10/02/2023   FOOT EXAM  11/05/2023   HEMOGLOBIN A1C  11/05/2023   Diabetic  kidney evaluation - Urine ACR  02/03/2024   Diabetic kidney evaluation - eGFR measurement  05/05/2024   Medicare Annual Wellness (AWV)  07/11/2024   Colonoscopy  01/16/2026   DTaP/Tdap/Td (4 - Td or Tdap) 07/10/2032   Pneumonia Vaccine 21+ Years old  Completed   Zoster Vaccines- Shingrix  Completed   HPV VACCINES  Aged Out   COVID-19 Vaccine  Discontinued    Health Maintenance  Health Maintenance Due  Topic Date Due   INFLUENZA VACCINE  06/17/2023    Colorectal cancer screening: No longer required.   Lung Cancer Screening: (Low Dose CT Chest recommended if Age 68-80 years, 20 pack-year currently smoking OR have quit w/in 15years.) does not qualify.   Lung Cancer Screening Referral: no  Additional Screening:  Hepatitis C Screening: does not  qualify;   Vision Screening: Recommended annual ophthalmology exams for early detection of glaucoma and other disorders of the eye. Is the patient up to date with their annual eye exam?  Yes  Who is the provider or what is the name of the office in which the patient attends annual eye exams? [facility] If pt is not established with a provider, would they like to be referred to a provider to establish care? No .   Dental Screening: Recommended annual dental exams for proper oral hygiene  Diabetic Foot Exam: Diabetic Foot Exam: Completed 11/04/2022  Community Resource Referral / Chronic Care Management: CRR required this visit?  No   CCM required this visit?  No     Plan:     I have personally reviewed and noted the following in the patient's chart:   Medical and social history Use of alcohol, tobacco or illicit drugs  Current medications and supplements including opioid prescriptions. Patient is not currently taking opioid prescriptions. Functional ability and status Nutritional status Physical activity Advanced directives List of other physicians Hospitalizations, surgeries, and ER visits in previous 12 months Vitals Screenings to include cognitive, depression, and falls Referrals and appointments  In addition, I have reviewed and discussed with patient certain preventive protocols, quality metrics, and best practice recommendations. A written personalized care plan for preventive services as well as general preventive health recommendations were provided to patient.     Barb Merino, LPN   [id number]   After Visit Summary: (MyChart) Due to this being a telephonic visit, the after visit summary with patients personalized plan was offered to patient via MyChart   Nurse Notes: none

## 2023-07-22 DIAGNOSIS — Z23 Encounter for immunization: Secondary | ICD-10-CM | POA: Diagnosis not present

## 2023-07-23 DIAGNOSIS — C61 Malignant neoplasm of prostate: Secondary | ICD-10-CM | POA: Diagnosis not present

## 2023-08-04 DIAGNOSIS — N401 Enlarged prostate with lower urinary tract symptoms: Secondary | ICD-10-CM | POA: Diagnosis not present

## 2023-08-04 DIAGNOSIS — R351 Nocturia: Secondary | ICD-10-CM | POA: Diagnosis not present

## 2023-08-04 DIAGNOSIS — C61 Malignant neoplasm of prostate: Secondary | ICD-10-CM | POA: Diagnosis not present

## 2023-08-09 DIAGNOSIS — M79672 Pain in left foot: Secondary | ICD-10-CM | POA: Diagnosis not present

## 2023-08-11 ENCOUNTER — Encounter: Payer: Self-pay | Admitting: Family Medicine

## 2023-08-11 ENCOUNTER — Ambulatory Visit: Payer: Medicare Other | Admitting: Family Medicine

## 2023-08-11 VITALS — BP 124/66 | HR 71 | Temp 97.8°F | Ht 72.0 in | Wt 192.6 lb

## 2023-08-11 DIAGNOSIS — R053 Chronic cough: Secondary | ICD-10-CM

## 2023-08-11 DIAGNOSIS — E782 Mixed hyperlipidemia: Secondary | ICD-10-CM

## 2023-08-11 DIAGNOSIS — N1832 Chronic kidney disease, stage 3b: Secondary | ICD-10-CM | POA: Diagnosis not present

## 2023-08-11 DIAGNOSIS — E1122 Type 2 diabetes mellitus with diabetic chronic kidney disease: Secondary | ICD-10-CM

## 2023-08-11 DIAGNOSIS — I10 Essential (primary) hypertension: Secondary | ICD-10-CM

## 2023-08-11 DIAGNOSIS — E1129 Type 2 diabetes mellitus with other diabetic kidney complication: Secondary | ICD-10-CM | POA: Diagnosis not present

## 2023-08-11 DIAGNOSIS — U099 Post covid-19 condition, unspecified: Secondary | ICD-10-CM | POA: Diagnosis not present

## 2023-08-11 DIAGNOSIS — K8689 Other specified diseases of pancreas: Secondary | ICD-10-CM | POA: Diagnosis not present

## 2023-08-11 DIAGNOSIS — Z7984 Long term (current) use of oral hypoglycemic drugs: Secondary | ICD-10-CM | POA: Diagnosis not present

## 2023-08-11 DIAGNOSIS — M109 Gout, unspecified: Secondary | ICD-10-CM

## 2023-08-11 MED ORDER — FLUTICASONE-SALMETEROL 115-21 MCG/ACT IN AERO: 2.0000 | INHALATION_SPRAY | Freq: Two times a day (BID) | RESPIRATORY_TRACT | 12 refills | Status: AC

## 2023-08-11 MED ORDER — METFORMIN HCL ER 500 MG PO TB24: ORAL_TABLET | ORAL | 1 refills | Status: DC

## 2023-08-11 NOTE — Assessment & Plan Note (Signed)
Lipids are at goal. Triglycerides were up on the [place] test, but this was not a fasting specimen. Continue atorvastatin 40 mg daily.

## 2023-08-11 NOTE — Assessment & Plan Note (Signed)
Stable. Continue use of Creon with meals.

## 2023-08-11 NOTE — Assessment & Plan Note (Signed)
Discussed potential approaches to reducing risk for recurrence. I recommend we check a uric acid level at his next blood draw.

## 2023-08-11 NOTE — Assessment & Plan Note (Signed)
Blood pressure is in good control. Continue lisinopril 20 mg daily and amlodipine 10 mg daily.

## 2023-08-11 NOTE — Assessment & Plan Note (Signed)
The A1c is running a bit higher at this point. If this persists, would consider addition of an SGLT2i, in light of his kidney disease and proteinuria. Continue metformin 1,000 mg bid.

## 2023-08-11 NOTE — Progress Notes (Signed)
[facility] [address] [place] [address] Dept: [phone] Dept Fax: [phone]  Chronic Care Office Visit  Subjective:    Patient ID: Nathan Gomez, male    DOB: December 25, 1940, 82 y.o..   MRN: [id number]  Chief Complaint  Patient presents with   Hypertension    3 month f/u.  Fasting today.     History of Present Illness:  Patient is in today for reassessment of chronic medical issues.  Mr. Hoglund has a history of Type 2 diabetes. He is managed with metformin 1,000 mg bid. He notes [facility] They did perform an A1c on 06/01/2023 which was 8.0%.  Mr. Mcclees has a history of essential hypertension. He is managed on lisinopril 20 mg daily and amlodipine 10 mg daily.  Mr. Schrag has hyperlipidemia. He is managed on atorvastatin 40 mg daily.   Mr. Mausolf has Stage 3b chronic kidney disease. He notes the [facility] did perf[facility] a renal [facility] and has recommended he see a nephrologist.   Mr. Asa has a history of pancreatic insufficiency. He takes Creon and notes that it variably helpful. He is pleased that the [facility] will be helpign him to obtain his Creon, as the cost has been an issue for him.  Mr. Porcella has had some degree of cough and mild dyspnea ever since he had COVID in 07/2021. He has been on inhalers and cough medicines in the past. More recently, he has an episode of bronchitis this summer. His cough has been increased since that time.  Mr. Thoroughman notes a recent episode of sudden onset of severe pain in the left 1st MTP joint. He happened to be seeing an orthopedist, who diagnosed gout. He was placed on a course of steroids, which has helped considerably. He has looked into dietary recommendations to reduce the risk of future attacks.   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/25/2021   Pancreatic insufficiency 03/31/2021   Irritable bowel syndrome with diarrhea 12/30/2020   Malignant neoplasm of prostate (HCC) 12/30/2020   Stage 3b chronic kidney disease (HCC) 05/01/2020   GERD (gastroesophageal reflux disease) 09/12/2017   Vitamin D deficiency 09/12/2017   Ruptured, tendon, Achilles, right, sequela 04/08/2017   Hypomagnesemia 04/08/2016   B12 deficiency 01/30/2016   Urinary retention 07/30/2015   Personal history of colonic adenomas 01/16/2013   Normocytic anemia 02/22/2012   Type 2 diabetes mellitus with stage 3b chronic kidney disease (HCC) 01/05/2011   Hyperlipidemia 01/05/2011   Essential hypertension 01/05/2011   Primary osteoarthritis of left knee 01/05/2011   Past Surgical History:  Procedure Laterality Date   CATARACT EXTRACTION  08/2012 L, 09/2012 R   COLONOSCOPY     PROSTATE BIOPSY     TONSILLECTOMY  1945   TOTAL HIP ARTHROPLASTY Left 2011   TOTAL HIP ARTHROPLASTY Right 07/09/2015   Procedure: TOTAL HIP ARTHROPLASTY ANTERIOR APPROACH;  Surgeon: Marcene Corning, MD;  Location: [facility] OR;  Service: Orthopedics;  Laterality: Right;   TOTAL KNEE ARTHROPLASTY Right 07/21/2016   Procedure: RIGHT TOTAL KNEE ARTHROPLASTY;  Surgeon: Marcene Corning, MD;  Location: [facility] OR;  Service: Orthopedics;  Laterality: Right;   Family History  Problem Relation Age of Onset   Cancer - Ovarian Mother    Heart disease Father 24       Died age 7   Hypertension Father    Ovarian cancer Sister    Diabetes Maternal Grandfather    Pneumonia Paternal Grandfather    Heart disease  Paternal Uncle    Brain cancer Cousin    Colon cancer Neg Hx    Esophageal cancer Neg Hx    Liver cancer Neg Hx    Pancreatic cancer Neg Hx    Rectal cancer Neg Hx    Stomach cancer Neg Hx    Outpatient Medications Prior to Visit  Medication Sig Dispense Refill   amLODipine (NORVASC) 10 MG tablet TAKE 1 TABLET DAILY 90 tablet 3   atorvastatin (LIPITOR) 40 MG tablet Take 1 tablet (40 mg total) by mouth daily. 90 tablet 3   blood glucose meter kit and supplies KIT Use to test blood sugar up to twice a day. DX E11.09 1 each 0   Cholecalciferol (VITAMIN D) 2000 units CAPS Take 1 capsule (2,000 Units total) by mouth every morning. 90 capsule 3   Continuous Blood Gluc Receiver (FREESTYLE LIBRE 14 DAY READER) DEVI 1 Device by Does not apply route as directed. 1 each 2   Cyanocobalamin (VITAMIN B12 PO) Take 1 tablet by mouth daily.     fluticasone (FLONASE) 50 MCG/ACT nasal spray Place 1 spray into both nostrils 2 (two) times daily.     glucose blood test strip Use as instructed to test blood sugar up to twice a day. DX:  E11.09 200 each 3   lactobacillus acidophilus (BACID) TABS tablet Take 2 tablets by mouth 3 (three) times daily.     Lancets 30G MISC Delica Fine Lancets. Use to test blood sugar up to twice a day. DX E11.09 200 each 3   lipase/protease/amylase (CREON) 36000 UNITS CPEP capsule Take by mouth.     lisinopril (ZESTRIL) 20 MG tablet TAKE 1 TABLET DAILY 90 tablet 3   Magnesium 100 MG CAPS Take 100 mg by mouth 2 (two) times daily.     Multiple Vitamins-Minerals (SENIOR MULTIVITAMIN PLUS) TABS Take 1 tablet by mouth daily.     predniSONE (STERAPRED UNI-PAK 21 TAB) 5 MG (21) TBPK tablet Take 5 mg by mouth daily.     psyllium (METAMUCIL) 58.6 % powder Take 1 packet by mouth 3 (three) times daily.     tamsulosin (FLOMAX) 0.4 MG CAPS capsule Take 1 capsule (0.4 mg total) by mouth at bedtime. (Patient taking differently: Take 0.4-0.8 mg by mouth See admin instructions. Take 0.4 mg by mouth  daily. Starting 7 days before procedure take 0.8mg  by mouth daily) 14 capsule 0   metFORMIN (GLUCOPHAGE-XR) 500 MG 24 hr tablet TAKE 2 TABLETS 2 TIMES     DAILY WITH MEALS 360 tablet 1   Krill Oil 300 MG CAPS Take 500 mg by mouth daily.  (Patient not taking: Reported on 08/11/2023)     benzonatate (TESSALON) 200 MG capsule Take 2 capsules (400 mg total) by mouth every 8 (eight) hours as needed. 20 capsule 0   No facility-administered medications prior to visit.   Allergies  Allergen Reactions   Nsaids     Kidney Disease   Sulfa Antibiotics Other (  See Comments)    As child    Not sure rxn   Thiazide-Type Diuretics Other (See Comments)    Unknown   Objective:   Today's Vitals   08/11/23 0800  BP: 124/66  Pulse: 71  Temp: 97.8 F (36.6 C)  TempSrc: Temporal  SpO2: 99%  Weight: 192 lb 9.6 oz (87.4 kg)  Height: 6' (1.829 m)   Body mass index is 26.12 kg/m.   General: Well developed, well nourished. No acute distress. HEENT: Normocephalic, non-traumatic. External ears normal. EAC and TMs normal bilaterally.  Lungs: Clear to auscultation bilaterally. No wheezing, rales or rhonchi. CV: RRR without murmurs or rubs. Pulses 2+ bilaterally. Psych: Alert and oriented. Normal mood and affect.  There are no preventive care reminders to display for this patient.  Lab Results (06/01/2023)  Lipid Panel  Total Cholesterol: 159 mg/dL  Triglycerides:  161 mg/dL  HDL Cholesterol: 09.6 mg/dL  LDL Cholesterol: 04.5 mg/dl  Hemoglobin W0J: 8.0 %  TSH: 1.95 mU/L  Comprehensive Metabolic Panel  Sodium: 139 mEq/L  Potassium: 4.3 mEq/L  Chloride: 105 mEq/L  CO2:  24 mmol/L  Glucose: 197 mg/dL  BUN:  30 mg/dL  Creatinine: 8.11 mg/dL  eGFR:  37 BJ/YNW/2.95A2  Complete Blood Count:  WBC:  12.5 K/uL  RBC:  3.67 M/mm3  Hemoglobin: 11.5 gm/dL  Hematocrit: 13.0 %  MCV:  92.4 fL  MCH:  31.3 pg  MCHC: 33.9 g/dL  RDW:  86.5 fL  Platelets: 306 K/cumm    Imaging: Renal Ultrasound  (07/07/2023) IMPRESSION: Minimal left pelviectasis. No shadowing renal calculi. No right hydronephrosis. Bilateral anechoic simple and minimally complicated renal cyst with thin internal septation. Follow-up can be performed in one year for stability. Prostamegaly with prevoid trabeculated bladder and post void residual 129 ml urinary bladder.   Assessment & Plan:   Problem List Items Addressed This Visit       Cardiovascular and Mediastinum   Essential hypertension - Primary    Blood pressure is in good control. Continue lisinopril 20 mg daily and amlodipine 10 mg daily.        Digestive   Pancreatic insufficiency    Stable. Continue use of Creon with meals.        Endocrine   Type 2 diabetes mellitus with stage 3b chronic kidney disease (HCC)    The A1c is running a bit higher at this point. If this persists, would consider addition of an SGLT2i, in light of his kidney disease and proteinuria. Continue metformin 1,000 mg bid.      Relevant Medications   metFORMIN (GLUCOPHAGE-XR) 500 MG 24 hr tablet     Musculoskeletal and Integument   Acute gout involving toe of left foot    Discussed potential approaches to reducing risk for recurrence. I recommend we check a uric acid level at his next blood draw.      Relevant Medications   predniSONE (STERAPRED UNI-PAK 21 TAB) 5 MG (21) TBPK tablet     Genitourinary   Stage 3b chronic kidney disease (HCC)    Continue focus on blood pressure and glucose control, adequate hydration, and avoidance of nephrotoxic medications. Nephrology appointment pending at the [facility].        Other   Hyperlipidemia    Lipids are at goal. Triglycerides were up on the [place] test, but this was not a fasting specimen. Continue atorvastatin 40 mg daily.      Post-COVID chronic cough    I recommend we try and restart LABA-ICS  inhaler to reduce some of his cough symptoms.      Relevant Medications   fluticasone-salmeterol (ADVAIR HFA) 115-21 MCG/ACT  inhaler   Other Visit Diagnoses     Controlled type 2 diabetes mellitus with other diabetic kidney complication, without long-term current use of insulin (HCC)       Relevant Medications   metFORMIN (GLUCOPHAGE-XR) 500 MG 24 hr tablet       Return in about 3 months (around 11/10/2023) for Reassessment.   Loyola Mast, MD

## 2023-08-11 NOTE — Assessment & Plan Note (Signed)
Continue focus on blood pressure and glucose control, adequate hydration, and avoidance of nephrotoxic medications. Nephrology appointment pending at the [facility].

## 2023-08-11 NOTE — Assessment & Plan Note (Signed)
I recommend we try and restart LABA-ICS inhaler to reduce some of his cough symptoms.

## 2023-08-26 DIAGNOSIS — M79675 Pain in left toe(s): Secondary | ICD-10-CM | POA: Diagnosis not present

## 2023-09-01 ENCOUNTER — Ambulatory Visit (INDEPENDENT_AMBULATORY_CARE_PROVIDER_SITE_OTHER): Payer: Medicare Other | Admitting: Podiatry

## 2023-09-01 ENCOUNTER — Encounter: Payer: Self-pay | Admitting: Podiatry

## 2023-09-01 DIAGNOSIS — M79674 Pain in right toe(s): Secondary | ICD-10-CM

## 2023-09-01 DIAGNOSIS — L84 Corns and callosities: Secondary | ICD-10-CM | POA: Diagnosis not present

## 2023-09-01 DIAGNOSIS — N1832 Chronic kidney disease, stage 3b: Secondary | ICD-10-CM

## 2023-09-01 DIAGNOSIS — B351 Tinea unguium: Secondary | ICD-10-CM | POA: Diagnosis not present

## 2023-09-01 DIAGNOSIS — M79675 Pain in left toe(s): Secondary | ICD-10-CM | POA: Diagnosis not present

## 2023-09-01 DIAGNOSIS — E1122 Type 2 diabetes mellitus with diabetic chronic kidney disease: Secondary | ICD-10-CM | POA: Diagnosis not present

## 2023-09-01 NOTE — Progress Notes (Signed)
Subjective:  Patient ID: Nathan Gomez, male    DOB: Apr 27, 1941,   MRN: [id number]  Chief Complaint  Patient presents with   Diabetes    DFC A1C - 7    82 y.o. male presents for concern of thickened elongated and painful nails that are difficult to trim. Requesting to have them trimmed today. Also has concern for callous on the outside of his left foot. Denies burning and tingling in their feet. Patient is diabetic and last A1c was  Lab Results  Component Value Date   HGBA1C 6.5 06/01/2023   .   PCP:  Loyola Mast, MD    . Denies any other pedal complaints. Denies n/v/f/c.   Past Medical History:  Diagnosis Date   Achilles tendinitis    Allergy    Cataract    Chronic kidney disease    Complication of anesthesia    urinary retention   Degenerative tear of left medial meniscus 11/2014   Diabetes mellitus, type 2 (HCC)    GERD (gastroesophageal reflux disease)    Glaucoma    Gout    Hyperlipidemia    Hypertension    Osteoarthritis    right knee   Pancreatic insufficiency    Personal history of colonic adenomas 01/16/2013   Plantar fasciitis    Prostate cancer (HCC)     Objective:  Physical Exam: Vascular: DP/PT pulses 2/4 bilateral. CFT <3 seconds. Absent hair growth on digits. Edema noted to bilateral lower extremities. Xerosis noted bilaterally.  Skin. No lacerations or abrasions bilateral feet. Nails 1-5 bilateral  are thickened discolored and elongated with subungual debris. Hyperkeartotic lesion noted to lateral left heel.  Musculoskeletal: MMT 5/5 bilateral lower extremities in DF, PF, Inversion and Eversion. Deceased ROM in DF of ankle joint.  Neurological: Sensation intact to light touch. Protective sensation intact bilateral.    Assessment:   1. Pain due to onychomycosis of toenails of both feet   2. Corn   3. Type 2 diabetes mellitus with stage 3b chronic kidney disease, without long-term current use of insulin (HCC)        Plan:  Patient was  evaluated and treated and all questions answered. -Discussed and educated patient on diabetic foot care, especially with  regards to the vascular, neurological and musculoskeletal systems.  -Stressed the importance of good glycemic control and the detriment of not  controlling glucose levels in relation to the foot. -Discussed supportive shoes at all times and checking feet regularly.  -Mechanically debrided all nails 1-5 bilateral using sterile nail nipper and filed with dremel without incident -Hyperkeratotic tissue debrided without issue with chisel.   -Answered all patient questions -Patient to return  in 3 months for at risk foot care -Patient advised to call the office if any problems or questions arise in the meantime.   Louann Sjogren, DPM

## 2023-09-03 DIAGNOSIS — M79675 Pain in left toe(s): Secondary | ICD-10-CM | POA: Diagnosis not present

## 2023-09-03 DIAGNOSIS — M1712 Unilateral primary osteoarthritis, left knee: Secondary | ICD-10-CM | POA: Diagnosis not present

## 2023-09-04 ENCOUNTER — Encounter: Payer: Self-pay | Admitting: Family Medicine

## 2023-09-04 DIAGNOSIS — M109 Gout, unspecified: Secondary | ICD-10-CM

## 2023-09-06 ENCOUNTER — Other Ambulatory Visit
Admission: RE | Admit: 2023-09-06 | Discharge: 2023-09-06 | Disposition: A | Discharge status: Home / Self Care | Payer: Medicare Other | Source: Ambulatory Visit | Attending: Oncology | Admitting: Oncology

## 2023-09-06 DIAGNOSIS — Z006 Encounter for examination for normal comparison and control in clinical research program: Secondary | ICD-10-CM | POA: Insufficient documentation

## 2023-09-07 ENCOUNTER — Other Ambulatory Visit (INDEPENDENT_AMBULATORY_CARE_PROVIDER_SITE_OTHER): Payer: Medicare Other

## 2023-09-07 DIAGNOSIS — M109 Gout, unspecified: Secondary | ICD-10-CM

## 2023-09-07 LAB — URIC ACID: Uric Acid, Serum: 7.6 mg/dL (ref 4.0–7.8)

## 2023-09-10 DIAGNOSIS — M1712 Unilateral primary osteoarthritis, left knee: Secondary | ICD-10-CM | POA: Diagnosis not present

## 2023-09-17 LAB — HELIX MOLECULAR SCREEN: Genetic Analysis Overall Interpretation: NEGATIVE

## 2023-09-17 LAB — GENECONNECT MOLECULAR SCREEN

## 2023-10-22 DIAGNOSIS — A0472 Enterocolitis due to Clostridium difficile, not specified as recurrent: Secondary | ICD-10-CM | POA: Diagnosis not present

## 2023-10-22 DIAGNOSIS — K8681 Exocrine pancreatic insufficiency: Secondary | ICD-10-CM | POA: Diagnosis not present

## 2023-11-23 ENCOUNTER — Encounter: Payer: Self-pay | Admitting: Family Medicine

## 2023-11-23 ENCOUNTER — Ambulatory Visit: Payer: Medicare Other | Admitting: Family Medicine

## 2023-11-23 VITALS — BP 118/66 | HR 74 | Temp 98.5°F | Ht 72.0 in | Wt 194.4 lb

## 2023-11-23 DIAGNOSIS — E782 Mixed hyperlipidemia: Secondary | ICD-10-CM | POA: Diagnosis not present

## 2023-11-23 DIAGNOSIS — E1122 Type 2 diabetes mellitus with diabetic chronic kidney disease: Secondary | ICD-10-CM

## 2023-11-23 DIAGNOSIS — I10 Essential (primary) hypertension: Secondary | ICD-10-CM

## 2023-11-23 DIAGNOSIS — Z7984 Long term (current) use of oral hypoglycemic drugs: Secondary | ICD-10-CM

## 2023-11-23 DIAGNOSIS — N1832 Chronic kidney disease, stage 3b: Secondary | ICD-10-CM | POA: Diagnosis not present

## 2023-11-23 LAB — BASIC METABOLIC PANEL
BUN: 29 mg/dL — ABNORMAL HIGH (ref 6–23)
CO2: 22 meq/L (ref 19–32)
Calcium: 9.3 mg/dL (ref 8.4–10.5)
Chloride: 108 meq/L (ref 96–112)
Creatinine, Ser: 1.55 mg/dL — ABNORMAL HIGH (ref 0.40–1.50)
GFR: 40.83 mL/min — ABNORMAL LOW (ref >60.00)
Glucose, Bld: 127 mg/dL — ABNORMAL HIGH (ref 70–99)
Potassium: 5 meq/L (ref 3.5–5.1)
Sodium: 140 meq/L (ref 135–145)

## 2023-11-23 LAB — PHOSPHORUS: Phosphorus: 3.5 mg/dL (ref 2.3–4.6)

## 2023-11-23 LAB — HEMOGLOBIN A1C: Hgb A1c MFr Bld: 7.7 % — ABNORMAL HIGH (ref 4.6–6.5)

## 2023-11-23 MED ORDER — SITAGLIPTIN PHOSPHATE 25 MG PO TABS: 25.0000 mg | ORAL_TABLET | Freq: Every day | ORAL | 3 refills | Status: AC

## 2023-11-23 NOTE — Assessment & Plan Note (Signed)
 Continue focus on blood pressure and glucose control, adequate hydration, and avoidance of nephrotoxic medications. I iwll check phosphorous level today.

## 2023-11-23 NOTE — Progress Notes (Signed)
 [facility] [address] [place] [address] Dept: [phone] Dept Fax: [phone]  Chronic Care Office Visit  Subjective:    Patient ID: Nathan Gomez, male    DOB: 1940/11/19, 83 y.o..   MRN: [id number]  Chief Complaint  Patient presents with   Hypertension    3 month f/u.  No concerns.    History of Present Illness:  Patient is in today for reassessment of chronic medical issues.   Mr. Harbor has a history of Type 2 diabetes. He is managed with metformin  XR 500 mg 2 tabs bid.    Mr. Lamorte has a history of essential hypertension. He is managed on lisinopril  20 mg daily and amlodipine  10 mg daily.   Mr. Wrightsman has hyperlipidemia. He is managed on atorvastatin  40 mg daily.    Mr. Kurtzman has Stage 3b chronic kidney disease.    Mr. Potvin has a history of pancreatic insufficiency. He takes Creon and notes that it variably helpful.    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/25/2021   Pancreatic insufficiency 03/31/2021   Irritable bowel syndrome with diarrhea 12/30/2020   Malignant neoplasm of prostate (HCC) 12/30/2020   Stage 3b chronic kidney disease (HCC) 05/01/2020   GERD (gastroesophageal reflux disease) 09/12/2017   Vitamin D  deficiency 09/12/2017   Ruptured, tendon, Achilles, right, sequela 04/08/2017   Hypomagnesemia 04/08/2016   B12 deficiency 01/30/2016   Urinary retention 07/30/2015   History of colonic polyps  01/16/2013   Normocytic anemia 02/22/2012   Type 2 diabetes mellitus with stage 3b chronic kidney disease (HCC) 01/05/2011   Hyperlipidemia 01/05/2011   Essential hypertension 01/05/2011   Primary osteoarthritis of left knee 01/05/2011   Past Surgical History:  Procedure Laterality Date   CATARACT EXTRACTION  08/2012 L, 09/2012 R   COLONOSCOPY     PROSTATE BIOPSY     TONSILLECTOMY  1945   TOTAL HIP ARTHROPLASTY Left 2011   TOTAL HIP ARTHROPLASTY Right 07/09/2015   Procedure: TOTAL HIP ARTHROPLASTY ANTERIOR APPROACH;  Surgeon: Maude Herald, MD;  Location: [facility] OR;  Service: Orthopedics;  Laterality: Right;   TOTAL KNEE ARTHROPLASTY Right 07/21/2016   Procedure: RIGHT TOTAL KNEE ARTHROPLASTY;  Surgeon: Maude Herald, MD;  Location: [facility] OR;  Service: Orthopedics;  Laterality: Right;   Family History  Problem Relation Age of Onset   Cancer - Ovarian Mother    Heart disease Father 27       Died age 43   Hypertension Father    Ovarian cancer Sister    Diabetes Maternal Grandfather    Pneumonia Paternal Grandfather    Heart disease Paternal Uncle    Brain cancer Cousin    Colon cancer Neg Hx    Esophageal cancer Neg Hx    Liver cancer Neg Hx    Pancreatic cancer Neg Hx    Rectal cancer Neg Hx    Stomach cancer Neg Hx    Outpatient Medications Prior to Visit  Medication Sig Dispense Refill  amLODipine  (NORVASC ) 10 MG tablet TAKE 1 TABLET DAILY 90 tablet 3   atorvastatin  (LIPITOR) 40 MG tablet Take 1 tablet (40 mg total) by mouth daily. 90 tablet 3   blood glucose meter kit and supplies KIT Use to test blood sugar up to twice a day. DX E11.09 1 each 0   Cholecalciferol  (VITAMIN D ) 2000 units CAPS Take 1 capsule (2,000 Units total) by mouth every morning. 90 capsule 3   Continuous Blood Gluc Receiver (FREESTYLE LIBRE 14 DAY READER) DEVI 1 Device by Does not apply route as directed. 1 each 2   Cyanocobalamin  (VITAMIN B12 PO) Take 1 tablet by mouth daily.     fluticasone  (FLONASE )  50 MCG/ACT nasal spray Place 1 spray into both nostrils 2 (two) times daily.     fluticasone -salmeterol (ADVAIR HFA) 115-21 MCG/ACT inhaler Inhale 2 puffs into the lungs 2 (two) times daily. 1 each 12   glucose blood test strip Use as instructed to test blood sugar up to twice a day. DX:  E11.09 200 each 3   Krill Oil 300 MG CAPS Take 500 mg by mouth daily.     lactobacillus acidophilus (BACID) TABS tablet Take 2 tablets by mouth 3 (three) times daily.     Lancets 30G MISC Delica Fine Lancets. Use to test blood sugar up to twice a day. DX E11.09 200 each 3   lipase/protease/amylase (CREON) 36000 UNITS CPEP capsule Take by mouth.     lisinopril  (ZESTRIL ) 20 MG tablet TAKE 1 TABLET DAILY 90 tablet 3   Magnesium  100 MG CAPS Take 100 mg by mouth 2 (two) times daily.     metFORMIN  (GLUCOPHAGE -XR) 500 MG 24 hr tablet TAKE 2 TABLETS 2 TIMES     DAILY WITH MEALS 360 tablet 1   Multiple Vitamins-Minerals (SENIOR MULTIVITAMIN PLUS) TABS Take 1 tablet by mouth daily.     psyllium (METAMUCIL) 58.6 % powder Take 1 packet by mouth 3 (three) times daily.     tamsulosin  (FLOMAX ) 0.4 MG CAPS capsule Take 1 capsule (0.4 mg total) by mouth at bedtime. (Patient taking differently: Take 0.4-0.8 mg by mouth See admin instructions. Take 0.4 mg by mouth daily. Starting 7 days before procedure take 0.8mg  by mouth daily) 14 capsule 0   predniSONE  (STERAPRED UNI-PAK 21 TAB) 5 MG (21) TBPK tablet Take 5 mg by mouth daily.     No facility-administered medications prior to visit.   Allergies  Allergen Reactions   Nsaids     Kidney Disease   Sulfa Antibiotics Other (See Comments)    As child    Not sure rxn   Thiazide-Type Diuretics Other (See Comments)    Unknown   Objective:   Today's Vitals   11/23/23 0759  BP: 118/66  Pulse: 74  Temp: 98.5 F (36.9 C)  TempSrc: Temporal  SpO2: 99%  Weight: 194 lb 6.4 oz (88.2 kg)  Height: 6' (1.829 m)   Body mass index is 26.37 kg/m.   General: Well developed, well  nourished. No acute distress. Feet- Skin intact. No sign of maceration between toes. Nails are normal. Dorsalis pedis and posterior tibial artery pulses are   normal. 5.07 monofilament testing normal, except over heels bilaterally. Psych: Alert and oriented. Normal mood and affect.  Health Maintenance Due  Topic Date Due   OPHTHALMOLOGY EXAM  10/02/2023   FOOT EXAM  11/05/2023     Assessment & Plan:   Problem List Items Addressed This Visit       Cardiovascular  and Mediastinum   Essential hypertension   Blood pressure is in good control. Continue lisinopril  20 mg daily and amlodipine  10 mg daily.      Relevant Orders   Basic metabolic panel     Endocrine   Type 2 diabetes mellitus with stage 3b chronic kidney disease (HCC) - Primary   I will check an A1c today. Continue metformin  XR 500 mg 2 tabs bid.      Relevant Orders   Hemoglobin A1c   Basic metabolic panel     Genitourinary   Stage 3b chronic kidney disease (HCC)   Continue focus on blood pressure and glucose control, adequate hydration, and avoidance of nephrotoxic medications. I iwll check phosphorous level today.      Relevant Orders   Phosphorus     Other   Hyperlipidemia   Lipids are at goal. Continue atorvastatin  40 mg daily.       Return in about 3 months (around 02/21/2024) for Reassessment.   Garnette CHRISTELLA Simpler, MD

## 2023-11-23 NOTE — Addendum Note (Signed)
 Addended by: Loyola Mast on: 11/23/2023 05:12 PM   Modules accepted: Orders

## 2023-11-23 NOTE — Assessment & Plan Note (Signed)
 I will check an A1c today. Continue metformin XR 500 mg 2 tabs bid.

## 2023-11-23 NOTE — Assessment & Plan Note (Signed)
Blood pressure is in good control. Continue lisinopril 20 mg daily and amlodipine 10 mg daily.

## 2023-11-23 NOTE — Assessment & Plan Note (Signed)
Lipids are at goal. Continue atorvastatin 40 mg daily.

## 2023-11-26 ENCOUNTER — Encounter: Payer: Self-pay | Admitting: Family Medicine

## 2023-12-07 ENCOUNTER — Other Ambulatory Visit: Payer: Self-pay

## 2023-12-07 DIAGNOSIS — E1159 Type 2 diabetes mellitus with other circulatory complications: Secondary | ICD-10-CM

## 2023-12-07 MED ORDER — LISINOPRIL 20 MG PO TABS: 20.0000 mg | ORAL_TABLET | Freq: Every day | ORAL | 1 refills | Status: DC

## 2023-12-08 ENCOUNTER — Encounter: Payer: Self-pay | Admitting: Podiatry

## 2023-12-08 ENCOUNTER — Telehealth: Payer: Self-pay | Admitting: Family Medicine

## 2023-12-08 ENCOUNTER — Ambulatory Visit (INDEPENDENT_AMBULATORY_CARE_PROVIDER_SITE_OTHER): Payer: Medicare Other | Admitting: Podiatry

## 2023-12-08 DIAGNOSIS — B351 Tinea unguium: Secondary | ICD-10-CM | POA: Diagnosis not present

## 2023-12-08 DIAGNOSIS — L84 Corns and callosities: Secondary | ICD-10-CM | POA: Diagnosis not present

## 2023-12-08 DIAGNOSIS — E1122 Type 2 diabetes mellitus with diabetic chronic kidney disease: Secondary | ICD-10-CM

## 2023-12-08 DIAGNOSIS — N1832 Chronic kidney disease, stage 3b: Secondary | ICD-10-CM | POA: Diagnosis not present

## 2023-12-08 DIAGNOSIS — M79674 Pain in right toe(s): Secondary | ICD-10-CM

## 2023-12-08 DIAGNOSIS — M79675 Pain in left toe(s): Secondary | ICD-10-CM | POA: Diagnosis not present

## 2023-12-08 NOTE — Progress Notes (Signed)
  Subjective:  Patient ID: Nathan Gomez, male    DOB: [birth year],   MRN: [id number]  Chief Complaint  Patient presents with   Nail Problem    dfc    83 y.o. male presents for concern of thickened elongated and painful nails that are difficult to trim. Requesting to have them trimmed today. Also has concern for callous on the outside of his left foot. Denies burning and tingling in their feet. Patient is diabetic and last A1c was  Lab Results  Component Value Date   HGBA1C 7.7 (H) 11/23/2023   .   PCP:  Loyola Mast, MD    . Denies any other pedal complaints. Denies n/v/f/c.   Past Medical History:  Diagnosis Date   Achilles tendinitis    Allergy    Cataract    Chronic kidney disease    Complication of anesthesia    urinary retention   Degenerative tear of left medial meniscus 11/2014   Diabetes mellitus, type 2 (HCC)    GERD (gastroesophageal reflux disease)    Glaucoma    Gout    Hyperlipidemia    Hypertension    Osteoarthritis    right knee   Pancreatic insufficiency    Personal history of colonic adenomas 01/16/2013   Plantar fasciitis    Prostate cancer (HCC)     Objective:  Physical Exam: Vascular: DP/PT pulses 2/4 bilateral. CFT <3 seconds. Absent hair growth on digits. Edema noted to bilateral lower extremities. Xerosis noted bilaterally.  Skin. No lacerations or abrasions bilateral feet. Nails 1-5 bilateral  are thickened discolored and elongated with subungual debris. Hyperkeartotic lesion noted to lateral left heel.  Musculoskeletal: MMT 5/5 bilateral lower extremities in DF, PF, Inversion and Eversion. Deceased ROM in DF of ankle joint.  Neurological: Sensation intact to light touch. Protective sensation intact bilateral.    Assessment:   1. Pain due to onychomycosis of toenails of both feet   2. Type 2 diabetes mellitus with stage 3b chronic kidney disease, without long-term current use of insulin (HCC)   3. Corn        Plan:  Patient was  evaluated and treated and all questions answered. -Discussed and educated patient on diabetic foot care, especially with  regards to the vascular, neurological and musculoskeletal systems.  -Stressed the importance of good glycemic control and the detriment of not  controlling glucose levels in relation to the foot. -Discussed supportive shoes at all times and checking feet regularly.  -Mechanically debrided all nails 1-5 bilateral using sterile nail nipper and filed with dremel without incident -Hyperkeratotic tissue debrided without issue with chisel.   -Answered all patient questions -Patient to return  in 3 months for at risk foot care -Patient advised to call the office if any problems or questions arise in the meantime.   Louann Sjogren, DPM

## 2023-12-08 NOTE — Telephone Encounter (Signed)
 ERROR

## 2023-12-10 ENCOUNTER — Telehealth: Payer: Self-pay | Admitting: Family Medicine

## 2023-12-10 NOTE — Telephone Encounter (Signed)
error

## 2023-12-11 ENCOUNTER — Encounter: Payer: Self-pay | Admitting: Family Medicine

## 2023-12-30 ENCOUNTER — Encounter: Payer: Self-pay | Admitting: Family Medicine

## 2023-12-30 DIAGNOSIS — H26493 Other secondary cataract, bilateral: Secondary | ICD-10-CM | POA: Diagnosis not present

## 2023-12-30 DIAGNOSIS — E119 Type 2 diabetes mellitus without complications: Secondary | ICD-10-CM | POA: Diagnosis not present

## 2023-12-30 DIAGNOSIS — Z961 Presence of intraocular lens: Secondary | ICD-10-CM | POA: Diagnosis not present

## 2023-12-30 LAB — HM DIABETES EYE EXAM

## 2024-01-27 DIAGNOSIS — C61 Malignant neoplasm of prostate: Secondary | ICD-10-CM | POA: Diagnosis not present

## 2024-01-30 ENCOUNTER — Other Ambulatory Visit: Payer: Self-pay | Admitting: Family Medicine

## 2024-01-30 DIAGNOSIS — E1129 Type 2 diabetes mellitus with other diabetic kidney complication: Secondary | ICD-10-CM

## 2024-02-04 DIAGNOSIS — R3914 Feeling of incomplete bladder emptying: Secondary | ICD-10-CM | POA: Diagnosis not present

## 2024-02-04 DIAGNOSIS — N401 Enlarged prostate with lower urinary tract symptoms: Secondary | ICD-10-CM | POA: Diagnosis not present

## 2024-02-04 DIAGNOSIS — R351 Nocturia: Secondary | ICD-10-CM | POA: Diagnosis not present

## 2024-02-04 DIAGNOSIS — C61 Malignant neoplasm of prostate: Secondary | ICD-10-CM | POA: Diagnosis not present

## 2024-02-21 ENCOUNTER — Ambulatory Visit: Payer: Medicare Other | Admitting: Family Medicine

## 2024-02-23 ENCOUNTER — Encounter: Payer: Self-pay | Admitting: Family Medicine

## 2024-02-23 ENCOUNTER — Ambulatory Visit: Payer: Medicare Other | Admitting: Family Medicine

## 2024-02-23 VITALS — BP 120/66 | HR 67 | Temp 98.1°F | Ht 72.0 in | Wt 188.0 lb

## 2024-02-23 DIAGNOSIS — I10 Essential (primary) hypertension: Secondary | ICD-10-CM

## 2024-02-23 DIAGNOSIS — E782 Mixed hyperlipidemia: Secondary | ICD-10-CM

## 2024-02-23 DIAGNOSIS — N1832 Chronic kidney disease, stage 3b: Secondary | ICD-10-CM

## 2024-02-23 DIAGNOSIS — Z7984 Long term (current) use of oral hypoglycemic drugs: Secondary | ICD-10-CM | POA: Diagnosis not present

## 2024-02-23 DIAGNOSIS — E1122 Type 2 diabetes mellitus with diabetic chronic kidney disease: Secondary | ICD-10-CM

## 2024-02-23 DIAGNOSIS — M109 Gout, unspecified: Secondary | ICD-10-CM | POA: Insufficient documentation

## 2024-02-23 DIAGNOSIS — H9313 Tinnitus, bilateral: Secondary | ICD-10-CM | POA: Insufficient documentation

## 2024-02-23 DIAGNOSIS — K8689 Other specified diseases of pancreas: Secondary | ICD-10-CM

## 2024-02-23 LAB — GLUCOSE, RANDOM: Glucose, Bld: 112 mg/dL — ABNORMAL HIGH (ref 70–99)

## 2024-02-23 LAB — HEMOGLOBIN A1C: Hgb A1c MFr Bld: 6.6 % — ABNORMAL HIGH (ref 4.6–6.5)

## 2024-02-23 LAB — MICROALBUMIN / CREATININE URINE RATIO
Creatinine,U: 66 mg/dL
Microalb Creat Ratio: 96.9 mg/g — ABNORMAL HIGH (ref 0.0–30.0)
Microalb, Ur: 6.4 mg/dL — ABNORMAL HIGH (ref 0.0–1.9)

## 2024-02-23 NOTE — Assessment & Plan Note (Signed)
 I will check an A1c today to see the impact of adding sitagliptin to his regimen. Continue metformin XR 500 mg 2 tabs bid and sitagliptin 25 mg daily.

## 2024-02-23 NOTE — Assessment & Plan Note (Signed)
 Continue focus on blood pressure and glucose control, adequate hydration, and avoidance of nephrotoxic medications.

## 2024-02-23 NOTE — Assessment & Plan Note (Signed)
Stable. Continue use of Creon with meals.

## 2024-02-23 NOTE — Assessment & Plan Note (Signed)
Blood pressure is in good control. Continue lisinopril 20 mg daily and amlodipine 10 mg daily.

## 2024-02-23 NOTE — Progress Notes (Signed)
 [facility] [address] [place] [address] Dept: [phone] Dept Fax: [phone]  Chronic Care Office Visit  Subjective:    Patient ID: Nathan Gomez, male    DOB: 06-Aug-1941, 83 y.o..   MRN: [id number]  Chief Complaint  Patient presents with   Diabetes    3 month f/u.  No concerns.  Average BS 130-140   History of Present Illness:  Patient is in today for reassessment of chronic medical issues.  Mr. Reaume has a history of Type 2 diabetes. He is managed with metformin XR 500 mg 2 tabs bid and sitagliptin (Januvia) 25 mg daily (started after his last visit). He has noted his blood sugars are now ranging 130-140.   Mr. Lusk has a history of essential hypertension. He is managed on lisinopril 20 mg daily and amlodipine 10 mg daily. He has has Stage 3b chronic kidney disease.    Mr. Tillett has hyperlipidemia. He is managed on atorvastatin 40 mg daily.   Mr. Melander has a history of pancreatic insufficiency. He takes Creon and notes that it variably helpful.   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/25/2021   Pancreatic insufficiency 03/31/2021   Irritable bowel syndrome with diarrhea 12/30/2020   Malignant neoplasm of prostate (HCC) 12/30/2020   Stage 3b chronic kidney disease (HCC) 05/01/2020   GERD (gastroesophageal reflux disease) 09/12/2017   Vitamin D deficiency 09/12/2017   Ruptured, tendon, Achilles,  right, sequela 04/08/2017   Hypomagnesemia 04/08/2016   B12 deficiency 01/30/2016   Urinary retention 07/30/2015   History of colonic polyps 01/16/2013   Normocytic anemia 02/22/2012   Type 2 diabetes mellitus with stage 3b chronic kidney disease (HCC) 01/05/2011   Hyperlipidemia 01/05/2011   Essential hypertension 01/05/2011   Primary osteoarthritis of left knee 01/05/2011   Past Surgical History:  Procedure Laterality Date   CATARACT EXTRACTION  08/2012 L, 09/2012 R   COLONOSCOPY     PROSTATE BIOPSY     TONSILLECTOMY  1945   TOTAL HIP ARTHROPLASTY Left 2011   TOTAL HIP ARTHROPLASTY Right 07/09/2015   Procedure: TOTAL HIP ARTHROPLASTY ANTERIOR APPROACH;  Surgeon: Marcene Corning, MD;  Location: [facility] OR;  Service: Orthopedics;  Laterality: Right;   TOTAL KNEE ARTHROPLASTY Right 07/21/2016   Procedure: RIGHT TOTAL KNEE ARTHROPLASTY;  Surgeon: Marcene Corning, MD;  Location: [facility] OR;  Service: Orthopedics;  Laterality: Right;   Family History  Problem Relation Age of Onset   Cancer - Ovarian Mother    Heart disease Father 58       Died age 58   Hypertension Father    Ovarian cancer Sister    Diabetes Maternal Grandfather    Pneumonia Paternal Grandfather    Heart disease Paternal Uncle    Brain cancer Cousin    Colon cancer Neg Hx    Esophageal cancer Neg Hx    Liver cancer Neg Hx    Pancreatic cancer Neg Hx    Rectal cancer Neg  Hx    Stomach cancer Neg Hx    Outpatient Medications Prior to Visit  Medication Sig Dispense Refill   amLODipine (NORVASC) 10 MG tablet TAKE 1 TABLET DAILY 90 tablet 3   atorvastatin (LIPITOR) 40 MG tablet Take 1 tablet (40 mg total) by mouth daily. 90 tablet 3   blood glucose meter kit and supplies KIT Use to test blood sugar up to twice a day. DX E11.09 1 each 0   Cholecalciferol (VITAMIN D) 2000 units CAPS Take 1 capsule (2,000 Units total) by mouth every morning. 90 capsule 3   Continuous Blood Gluc Receiver (FREESTYLE LIBRE 14 DAY READER) DEVI 1  Device by Does not apply route as directed. 1 each 2   Cyanocobalamin (VITAMIN B12 PO) Take 1 tablet by mouth daily.     fluticasone (FLONASE) 50 MCG/ACT nasal spray Place 1 spray into both nostrils 2 (two) times daily.     fluticasone-salmeterol (ADVAIR HFA) 115-21 MCG/ACT inhaler Inhale 2 puffs into the lungs 2 (two) times daily. 1 each 12   glucose blood test strip Use as instructed to test blood sugar up to twice a day. DX:  E11.09 200 each 3   Krill Oil 300 MG CAPS Take 500 mg by mouth daily.     lactobacillus acidophilus (BACID) TABS tablet Take 2 tablets by mouth 3 (three) times daily.     Lancets 30G MISC Delica Fine Lancets. Use to test blood sugar up to twice a day. DX E11.09 200 each 3   lipase/protease/amylase (CREON) 36000 UNITS CPEP capsule Take by mouth.     lisinopril (ZESTRIL) 20 MG tablet Take 1 tablet (20 mg total) by mouth daily. 90 tablet 1   Magnesium 100 MG CAPS Take 100 mg by mouth 2 (two) times daily.     metFORMIN (GLUCOPHAGE-XR) 500 MG 24 hr tablet TAKE 2 TABLETS 2 TIMES DAILY WITH MEALS 360 tablet 3   Multiple Vitamins-Minerals (SENIOR MULTIVITAMIN PLUS) TABS Take 1 tablet by mouth daily.     psyllium (METAMUCIL) 58.6 % powder Take 1 packet by mouth 3 (three) times daily.     sitaGLIPtin (JANUVIA) 25 MG tablet Take 1 tablet (25 mg total) by mouth daily. 90 tablet 3   tamsulosin (FLOMAX) 0.4 MG CAPS capsule Take 1 capsule (0.4 mg total) by mouth at bedtime. (Patient taking differently: Take 0.4-0.8 mg by mouth See admin instructions. Take 0.4 mg by mouth daily. Starting 7 days before procedure take 0.8mg  by mouth daily) 14 capsule 0   No facility-administered medications prior to visit.   Allergies  Allergen Reactions   Nsaids     Kidney Disease   Sulfa Antibiotics Other (See Comments)    As child    Not sure rxn   Thiazide-Type Diuretics Other (See Comments)    Unknown   Objective:   Today's Vitals   02/23/24 0824  BP: 120/66  Pulse: 67  Temp: 98.1 F  (36.7 C)  TempSrc: Temporal  SpO2: 99%  Weight: 188 lb (85.3 kg)  Height: 6' (1.829 m)   Body mass index is 25.5 kg/m.   General: Well developed, well nourished. No acute distress. Psych: Alert and oriented. Normal mood and affect.  Health Maintenance Due  Topic Date Due   Diabetic kidney evaluation - Urine ACR  02/03/2024     Assessment & Plan:   Problem List Items Addressed This Visit       Cardiovascular and Mediastinum   Essential hypertension - Primary   Blood pressure  is in good control. Continue lisinopril 20 mg daily and amlodipine 10 mg daily.        Digestive   Pancreatic insufficiency   Stable. Continue use of Creon with meals.        Endocrine   Type 2 diabetes mellitus with stage 3b chronic kidney disease (HCC)   I will check an A1c today to see the impact of adding sitagliptin to his regimen. Continue metformin XR 500 mg 2 tabs bid and sitagliptin 25 mg daily.      Relevant Orders   Glucose, random   Hemoglobin A1c   Microalbumin / creatinine urine ratio     Genitourinary   Stage 3b chronic kidney disease (HCC)   Continue focus on blood pressure and glucose control, adequate hydration, and avoidance of nephrotoxic medications.      Relevant Orders   Microalbumin / creatinine urine ratio     Other   Hyperlipidemia   Lipids are at goal. Continue atorvastatin 40 mg daily.       Return in about 3 months (around 05/24/2024) for Reassessment.   Loyola Mast, MD

## 2024-02-23 NOTE — Assessment & Plan Note (Signed)
Lipids are at goal. Continue atorvastatin 40 mg daily.

## 2024-02-25 DIAGNOSIS — M1712 Unilateral primary osteoarthritis, left knee: Secondary | ICD-10-CM | POA: Diagnosis not present

## 2024-03-08 ENCOUNTER — Ambulatory Visit: Payer: Medicare Other | Admitting: Podiatry

## 2024-03-22 ENCOUNTER — Ambulatory Visit: Admitting: Podiatry

## 2024-03-22 ENCOUNTER — Encounter: Payer: Self-pay | Admitting: Podiatry

## 2024-03-22 DIAGNOSIS — M79675 Pain in left toe(s): Secondary | ICD-10-CM

## 2024-03-22 DIAGNOSIS — B351 Tinea unguium: Secondary | ICD-10-CM

## 2024-03-22 DIAGNOSIS — E1122 Type 2 diabetes mellitus with diabetic chronic kidney disease: Secondary | ICD-10-CM

## 2024-03-22 DIAGNOSIS — L84 Corns and callosities: Secondary | ICD-10-CM | POA: Diagnosis not present

## 2024-03-22 DIAGNOSIS — N1832 Chronic kidney disease, stage 3b: Secondary | ICD-10-CM

## 2024-03-22 DIAGNOSIS — M79674 Pain in right toe(s): Secondary | ICD-10-CM

## 2024-03-22 NOTE — Progress Notes (Signed)
  Subjective:  Patient ID: Nathan Gomez, male    DOB: 14-Aug-1941,   MRN: [id number]  Chief Complaint  Patient presents with   Nail Problem    RFC    83 y.o. male presents for concern of thickened elongated and painful nails that are difficult to trim. Requesting to have them trimmed today. Also has concern for callous on the outside of his left foot. Denies burning and tingling in their feet. Patient is diabetic and last A1c was  Lab Results  Component Value Date   HGBA1C 6.6 (H) 02/23/2024   .   PCP:  Graig Lawyer, MD    . Denies any other pedal complaints. Denies n/v/f/c.   Past Medical History:  Diagnosis Date   Achilles tendinitis    Allergy    Cataract    Chronic kidney disease    Complication of anesthesia    urinary retention   Degenerative tear of left medial meniscus 11/2014   Diabetes mellitus, type 2 (HCC)    GERD (gastroesophageal reflux disease)    Glaucoma    Gout    Hyperlipidemia    Hypertension    Osteoarthritis    right knee   Pancreatic insufficiency    Personal history of colonic adenomas 01/16/2013   Plantar fasciitis    Prostate cancer (HCC)     Objective:  Physical Exam: Vascular: DP/PT pulses 2/4 bilateral. CFT <3 seconds. Absent hair growth on digits. Edema noted to bilateral lower extremities. Xerosis noted bilaterally.  Skin. No lacerations or abrasions bilateral feet. Nails 1-5 bilateral  are thickened discolored and elongated with subungual debris. Hyperkeartotic lesion noted to lateral left heel.  Musculoskeletal: MMT 5/5 bilateral lower extremities in DF, PF, Inversion and Eversion. Deceased ROM in DF of ankle joint.  Neurological: Sensation intact to light touch. Protective sensation intact bilateral.    Assessment:   1. Pain due to onychomycosis of toenails of both feet   2. Type 2 diabetes mellitus with stage 3b chronic kidney disease, without long-term current use of insulin  (HCC)   3. Corn        Plan:  Patient was  evaluated and treated and all questions answered. -Discussed and educated patient on diabetic foot care, especially with  regards to the vascular, neurological and musculoskeletal systems.  -Stressed the importance of good glycemic control and the detriment of not  controlling glucose levels in relation to the foot. -Discussed supportive shoes at all times and checking feet regularly.  -Mechanically debrided all nails 1-5 bilateral using sterile nail nipper and filed with dremel without incident -Hyperkeratotic tissue debrided without issue with chisel x 1 to lateral left heel.    -Answered all patient questions -Patient to return  in 3 months for at risk foot care -Patient advised to call the office if any problems or questions arise in the meantime.   Jennefer Moats, DPM

## 2024-03-23 ENCOUNTER — Encounter: Payer: Self-pay | Admitting: Family Medicine

## 2024-03-23 NOTE — Telephone Encounter (Signed)
 Patient isn't having any SOB. Can you please send RX to the [facility] in chart.    Thanks. Dm/cma

## 2024-03-24 ENCOUNTER — Other Ambulatory Visit: Payer: Self-pay | Admitting: Family

## 2024-03-24 MED ORDER — DOXYCYCLINE HYCLATE 100 MG PO TABS: 100.0000 mg | ORAL_TABLET | Freq: Two times a day (BID) | ORAL | 0 refills | Status: DC

## 2024-03-28 ENCOUNTER — Ambulatory Visit: Payer: Self-pay | Admitting: *Deleted

## 2024-03-28 NOTE — Telephone Encounter (Signed)
  Chief Complaint: fatigue , worsening weakness since treatment for cough , congestion. Going on 2 weeks , finished antibiotics Symptoms: worsening fatigue. Can walk but reports "can not get over sickness' after cruise. Continued cough clear mucus noted  Frequency: 2 weeks  Pertinent Negatives: Patient denies chest pain no difficulty breathing no fever no SOB. Denies dizziness Disposition: [] ED /[] Urgent Care (no appt availability in office) / [x] Appointment(In office/virtual)/ []  [facility]/ [] Home Care/ [] Refused Recommended Disposition /[] [facility]/ []  Follow-up with PCP Additional Notes:   Appt scheduled for tomorrow with other provider . None available with PCP. Recommended to incorporate Gatorade with water.       Reason for Disposition  [1] MODERATE weakness (i.e., interferes with work, school, normal activities) AND [2] persists > 3 days  Answer Assessment - Initial Assessment Questions 1. DESCRIPTION: "Describe how you are feeling."     Fatigue  2. SEVERITY: "How bad is it?"  "Can you stand and walk?"   - MILD (0-3): Feels weak or tired, but does not interfere with work, school or normal activities.   - MODERATE (4-7): Able to stand and walk; weakness interferes with work, school, or normal activities.   - SEVERE (8-10): Unable to stand or walk; unable to do usual activities.     moderate 3. ONSET: "When did these symptoms begin?" (e.g., hours, days, weeks, months)     moderate 4. CAUSE: "What do you think is causing the weakness or fatigue?" (e.g., not drinking enough fluids, medical problem, trouble sleeping)     Not sure trying to drink enough fluids 5. NEW MEDICINES:  "Have you started on any new medicines recently?" (e.g., opioid pain medicines, benzodiazepines, muscle relaxants, antidepressants, antihistamines, neuroleptics, beta blockers)     Na  6. OTHER SYMPTOMS: "Do you have any other symptoms?" (e.g., chest pain, fever, cough, SOB, vomiting,  diarrhea, bleeding, other areas of pain)     Fatigue getting worse since cough congestion 03/27/24. 7. PREGNANCY: "Is there any chance you are pregnant?" "When was your last menstrual period?"     na  Protocols used: Weakness (Generalized) and Fatigue-A-AH

## 2024-03-28 NOTE — Telephone Encounter (Signed)
 Noted. Patient scheduled by nurse triage with Dr. Hildy Lowers.

## 2024-03-29 ENCOUNTER — Ambulatory Visit: Admitting: Family Medicine

## 2024-04-06 ENCOUNTER — Ambulatory Visit (INDEPENDENT_AMBULATORY_CARE_PROVIDER_SITE_OTHER): Admitting: Family Medicine

## 2024-04-06 ENCOUNTER — Encounter: Payer: Self-pay | Admitting: Family Medicine

## 2024-04-06 VITALS — BP 124/68 | HR 71 | Temp 97.4°F | Ht 72.0 in | Wt 188.0 lb

## 2024-04-06 DIAGNOSIS — J40 Bronchitis, not specified as acute or chronic: Secondary | ICD-10-CM

## 2024-04-06 MED ORDER — HYDROCODONE BIT-HOMATROP MBR 5-1.5 MG/5ML PO SOLN: 5.0000 mL | Freq: Three times a day (TID) | ORAL | 0 refills | Status: AC | PRN

## 2024-04-06 NOTE — Progress Notes (Signed)
 [facility] [address] [place] [address] Dept: [phone] Dept Fax: [phone]  Office Visit  Subjective:    Patient ID: Alyssa Backbone, male    DOB: 29-Aug-1941, 83 y.o..   MRN: [id number]  Chief Complaint  Patient presents with   Cough    C/o having some fatigue/cough x 3 weeks.   Has taken Robitussin DM.  Wants ears checked.    History of Present Illness:  Patient is in today complaining of a persistent cough. Mr. Jacky Massing had been on a cruise in the [place] in April. While on the cruise, both he and his wife came down with symptoms of a respiratory illness. On his return, Jarold Merlin, NP sent in a prescription for him for doxycycline  for 7 days. He felt this did significantly improve his symptoms. However, he has had some lingering cough at times. He is not having nasal congestion. He has been worried about potentially being infectious for his wife (she has since had a pulmonary embolism).  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/25/2021   Pancreatic insufficiency 03/31/2021   Irritable bowel syndrome with diarrhea 12/30/2020   Malignant neoplasm of prostate (HCC) 12/30/2020   Stage 3b chronic kidney disease (HCC) 05/01/2020   GERD (gastroesophageal reflux disease) 09/12/2017   Vitamin D  deficiency 09/12/2017   Ruptured, tendon, Achilles, right, sequela  04/08/2017   Hypomagnesemia 04/08/2016   B12 deficiency 01/30/2016   Urinary retention 07/30/2015   History of colonic polyps 01/16/2013   Normocytic anemia 02/22/2012   Type 2 diabetes mellitus with stage 3b chronic kidney disease (HCC) 01/05/2011   Hyperlipidemia 01/05/2011   Essential hypertension 01/05/2011   Primary osteoarthritis of left knee 01/05/2011   Past Surgical History:  Procedure Laterality Date   CATARACT EXTRACTION  08/2012 L, 09/2012 R   COLONOSCOPY     PROSTATE BIOPSY     TONSILLECTOMY  1945   TOTAL HIP ARTHROPLASTY Left 2011   TOTAL HIP ARTHROPLASTY Right 07/09/2015   Procedure: TOTAL HIP ARTHROPLASTY ANTERIOR APPROACH;  Surgeon: Dayne Even, MD;  Location: [facility] OR;  Service: Orthopedics;  Laterality: Right;   TOTAL KNEE ARTHROPLASTY Right 07/21/2016   Procedure: RIGHT TOTAL KNEE ARTHROPLASTY;  Surgeon: Dayne Even, MD;  Location: [facility] OR;  Service: Orthopedics;  Laterality: Right;   Family History  Problem Relation Age of Onset   Cancer - Ovarian Mother    Heart disease Father 46       Died age 85   Hypertension Father    Ovarian cancer Sister    Diabetes Maternal Grandfather    Pneumonia Paternal Grandfather    Heart disease Paternal Uncle    Brain cancer Cousin    Colon cancer Neg Hx    Esophageal cancer Neg Hx    Liver cancer Neg Hx    Pancreatic cancer Neg Hx    Rectal cancer Neg Hx    Stomach cancer Neg Hx  Outpatient Medications Prior to Visit  Medication Sig Dispense Refill   amLODipine  (NORVASC ) 10 MG tablet TAKE 1 TABLET DAILY 90 tablet 3   atorvastatin  (LIPITOR) 40 MG tablet Take 1 tablet (40 mg total) by mouth daily. 90 tablet 3   blood glucose meter kit and supplies KIT Use to test blood sugar up to twice a day. DX E11.09 1 each 0   Cholecalciferol  (VITAMIN D ) 2000 units CAPS Take 1 capsule (2,000 Units total) by mouth every morning. 90 capsule 3   Continuous Blood Gluc Receiver (FREESTYLE LIBRE 14 DAY READER) DEVI 1 Device by Does  not apply route as directed. 1 each 2   Cyanocobalamin  (VITAMIN B12 PO) Take 1 tablet by mouth daily.     fluticasone  (FLONASE ) 50 MCG/ACT nasal spray Place 1 spray into both nostrils 2 (two) times daily.     fluticasone -salmeterol (ADVAIR HFA) 115-21 MCG/ACT inhaler Inhale 2 puffs into the lungs 2 (two) times daily. 1 each 12   glucose blood test strip Use as instructed to test blood sugar up to twice a day. DX:  E11.09 200 each 3   Krill Oil 300 MG CAPS Take 500 mg by mouth daily.     lactobacillus acidophilus (BACID) TABS tablet Take 2 tablets by mouth 3 (three) times daily.     Lancets 30G MISC Delica Fine Lancets. Use to test blood sugar up to twice a day. DX E11.09 200 each 3   lipase/protease/amylase (CREON) 36000 UNITS CPEP capsule Take by mouth.     lisinopril  (ZESTRIL ) 20 MG tablet Take 1 tablet (20 mg total) by mouth daily. 90 tablet 1   Magnesium  100 MG CAPS Take 100 mg by mouth 2 (two) times daily.     metFORMIN  (GLUCOPHAGE -XR) 500 MG 24 hr tablet TAKE 2 TABLETS 2 TIMES DAILY WITH MEALS 360 tablet 3   Multiple Vitamins-Minerals (SENIOR MULTIVITAMIN PLUS) TABS Take 1 tablet by mouth daily.     psyllium (METAMUCIL) 58.6 % powder Take 1 packet by mouth 3 (three) times daily.     sitaGLIPtin  (JANUVIA ) 25 MG tablet Take 1 tablet (25 mg total) by mouth daily. 90 tablet 3   tamsulosin  (FLOMAX ) 0.4 MG CAPS capsule Take 1 capsule (0.4 mg total) by mouth at bedtime. (Patient taking differently: Take 0.4-0.8 mg by mouth See admin instructions. Take 0.4 mg by mouth daily. Starting 7 days before procedure take 0.8mg  by mouth daily) 14 capsule 0   doxycycline  (VIBRA -TABS) 100 MG tablet Take 1 tablet (100 mg total) by mouth 2 (two) times daily. 20 tablet 0   No facility-administered medications prior to visit.   Allergies  Allergen Reactions   Nsaids     Kidney Disease   Sulfa Antibiotics Other (See Comments)    As child    Not sure rxn   Thiazide-Type Diuretics Other (See Comments)     Unknown     Objective:   Today's Vitals   04/06/24 1050  BP: 124/68  Pulse: 71  Temp: (!) 97.4 F (36.3 C)  TempSrc: Temporal  SpO2: 97%  Weight: 188 lb (85.3 kg)  Height: 6' (1.829 m)   Body mass index is 25.5 kg/m.   General: Well developed, well nourished. No acute distress. HEENT: Normocephalic, non-traumatic. External ears normal. EAC and TMs normal bilaterally. Nose    clear without congestion or rhinorrhea. Mucous membranes moist. Oropharynx clear. Good dentition. Lungs: Clear to auscultation bilaterally. No wheezing, rales or rhonchi. Psych: Alert and oriented. Normal mood and affect.  There are no preventive care reminders to display for this patient.    Assessment & Plan:   Problem List Items Addressed This Visit       Respiratory   Bronchitis - Primary   I suspect a lingering cough associated with bronchitis. Recommend giving this more time to resolve. I could use Mucinex. I will provide some hydrocodone  cough syrup for nighttime use.      Relevant Medications   HYDROcodone  bit-homatropine (HYCODAN) 5-1.5 MG/5ML syrup    Return if symptoms worsen or fail to improve.   Graig Lawyer, MD

## 2024-04-06 NOTE — Assessment & Plan Note (Signed)
 I suspect a lingering cough associated with bronchitis. Recommend giving this more time to resolve. I could use Mucinex. I will provide some hydrocodone  cough syrup for nighttime use.

## 2024-04-14 DIAGNOSIS — M1712 Unilateral primary osteoarthritis, left knee: Secondary | ICD-10-CM | POA: Diagnosis not present

## 2024-04-26 DIAGNOSIS — M1712 Unilateral primary osteoarthritis, left knee: Secondary | ICD-10-CM | POA: Diagnosis not present

## 2024-05-03 DIAGNOSIS — R300 Dysuria: Secondary | ICD-10-CM | POA: Diagnosis not present

## 2024-05-03 DIAGNOSIS — J209 Acute bronchitis, unspecified: Secondary | ICD-10-CM | POA: Diagnosis not present

## 2024-05-03 DIAGNOSIS — J069 Acute upper respiratory infection, unspecified: Secondary | ICD-10-CM | POA: Diagnosis not present

## 2024-05-07 DIAGNOSIS — E860 Dehydration: Secondary | ICD-10-CM | POA: Diagnosis not present

## 2024-05-07 DIAGNOSIS — N189 Chronic kidney disease, unspecified: Secondary | ICD-10-CM | POA: Diagnosis not present

## 2024-05-07 DIAGNOSIS — Z87891 Personal history of nicotine dependence: Secondary | ICD-10-CM | POA: Diagnosis not present

## 2024-05-07 DIAGNOSIS — I129 Hypertensive chronic kidney disease with stage 1 through stage 4 chronic kidney disease, or unspecified chronic kidney disease: Secondary | ICD-10-CM | POA: Diagnosis not present

## 2024-05-07 DIAGNOSIS — R5383 Other fatigue: Secondary | ICD-10-CM | POA: Diagnosis not present

## 2024-05-07 DIAGNOSIS — R944 Abnormal results of kidney function studies: Secondary | ICD-10-CM | POA: Diagnosis not present

## 2024-05-07 DIAGNOSIS — I440 Atrioventricular block, first degree: Secondary | ICD-10-CM | POA: Diagnosis not present

## 2024-05-07 DIAGNOSIS — R079 Chest pain, unspecified: Secondary | ICD-10-CM | POA: Diagnosis not present

## 2024-05-17 DIAGNOSIS — R1010 Upper abdominal pain, unspecified: Secondary | ICD-10-CM | POA: Diagnosis not present

## 2024-05-17 DIAGNOSIS — D649 Anemia, unspecified: Secondary | ICD-10-CM | POA: Diagnosis not present

## 2024-05-17 DIAGNOSIS — K8689 Other specified diseases of pancreas: Secondary | ICD-10-CM | POA: Diagnosis not present

## 2024-05-17 DIAGNOSIS — R197 Diarrhea, unspecified: Secondary | ICD-10-CM | POA: Diagnosis not present

## 2024-05-17 DIAGNOSIS — R634 Abnormal weight loss: Secondary | ICD-10-CM | POA: Diagnosis not present

## 2024-05-24 ENCOUNTER — Ambulatory Visit: Admitting: Family Medicine

## 2024-05-25 DIAGNOSIS — R197 Diarrhea, unspecified: Secondary | ICD-10-CM | POA: Diagnosis not present

## 2024-05-25 DIAGNOSIS — R1010 Upper abdominal pain, unspecified: Secondary | ICD-10-CM | POA: Diagnosis not present

## 2024-05-25 DIAGNOSIS — K8689 Other specified diseases of pancreas: Secondary | ICD-10-CM | POA: Diagnosis not present

## 2024-05-25 DIAGNOSIS — D649 Anemia, unspecified: Secondary | ICD-10-CM | POA: Diagnosis not present

## 2024-05-25 DIAGNOSIS — R634 Abnormal weight loss: Secondary | ICD-10-CM | POA: Diagnosis not present

## 2024-05-26 DIAGNOSIS — R634 Abnormal weight loss: Secondary | ICD-10-CM | POA: Diagnosis not present

## 2024-05-26 DIAGNOSIS — R1010 Upper abdominal pain, unspecified: Secondary | ICD-10-CM | POA: Diagnosis not present

## 2024-05-31 DIAGNOSIS — D649 Anemia, unspecified: Secondary | ICD-10-CM | POA: Diagnosis not present

## 2024-05-31 DIAGNOSIS — E782 Mixed hyperlipidemia: Secondary | ICD-10-CM | POA: Diagnosis not present

## 2024-05-31 DIAGNOSIS — Z8546 Personal history of malignant neoplasm of prostate: Secondary | ICD-10-CM | POA: Diagnosis not present

## 2024-05-31 DIAGNOSIS — M2550 Pain in unspecified joint: Secondary | ICD-10-CM | POA: Diagnosis not present

## 2024-05-31 DIAGNOSIS — M545 Low back pain, unspecified: Secondary | ICD-10-CM | POA: Diagnosis not present

## 2024-05-31 DIAGNOSIS — I10 Essential (primary) hypertension: Secondary | ICD-10-CM | POA: Diagnosis not present

## 2024-05-31 DIAGNOSIS — K580 Irritable bowel syndrome with diarrhea: Secondary | ICD-10-CM | POA: Diagnosis not present

## 2024-05-31 DIAGNOSIS — N1832 Chronic kidney disease, stage 3b: Secondary | ICD-10-CM | POA: Diagnosis not present

## 2024-05-31 DIAGNOSIS — K8689 Other specified diseases of pancreas: Secondary | ICD-10-CM | POA: Diagnosis not present

## 2024-05-31 DIAGNOSIS — J309 Allergic rhinitis, unspecified: Secondary | ICD-10-CM | POA: Diagnosis not present

## 2024-05-31 DIAGNOSIS — R42 Dizziness and giddiness: Secondary | ICD-10-CM | POA: Diagnosis not present

## 2024-05-31 DIAGNOSIS — E119 Type 2 diabetes mellitus without complications: Secondary | ICD-10-CM | POA: Diagnosis not present

## 2024-06-05 ENCOUNTER — Other Ambulatory Visit: Payer: Self-pay | Admitting: Family Medicine

## 2024-06-05 DIAGNOSIS — E1159 Type 2 diabetes mellitus with other circulatory complications: Secondary | ICD-10-CM

## 2024-06-05 DIAGNOSIS — E782 Mixed hyperlipidemia: Secondary | ICD-10-CM

## 2024-06-07 DIAGNOSIS — D649 Anemia, unspecified: Secondary | ICD-10-CM | POA: Diagnosis not present

## 2024-06-07 DIAGNOSIS — R1010 Upper abdominal pain, unspecified: Secondary | ICD-10-CM | POA: Diagnosis not present

## 2024-06-07 DIAGNOSIS — R634 Abnormal weight loss: Secondary | ICD-10-CM | POA: Diagnosis not present

## 2024-06-07 DIAGNOSIS — K8689 Other specified diseases of pancreas: Secondary | ICD-10-CM | POA: Diagnosis not present

## 2024-06-07 DIAGNOSIS — R197 Diarrhea, unspecified: Secondary | ICD-10-CM | POA: Diagnosis not present

## 2024-06-23 DIAGNOSIS — R932 Abnormal findings on diagnostic imaging of liver and biliary tract: Secondary | ICD-10-CM | POA: Diagnosis not present

## 2024-06-23 DIAGNOSIS — R1010 Upper abdominal pain, unspecified: Secondary | ICD-10-CM | POA: Diagnosis not present

## 2024-06-23 DIAGNOSIS — D649 Anemia, unspecified: Secondary | ICD-10-CM | POA: Diagnosis not present

## 2024-06-23 DIAGNOSIS — R42 Dizziness and giddiness: Secondary | ICD-10-CM | POA: Diagnosis not present

## 2024-06-23 DIAGNOSIS — K8689 Other specified diseases of pancreas: Secondary | ICD-10-CM | POA: Diagnosis not present

## 2024-07-03 DIAGNOSIS — R932 Abnormal findings on diagnostic imaging of liver and biliary tract: Secondary | ICD-10-CM | POA: Diagnosis not present

## 2024-07-04 DIAGNOSIS — E782 Mixed hyperlipidemia: Secondary | ICD-10-CM | POA: Diagnosis not present

## 2024-07-04 DIAGNOSIS — K8689 Other specified diseases of pancreas: Secondary | ICD-10-CM | POA: Diagnosis not present

## 2024-07-04 DIAGNOSIS — I10 Essential (primary) hypertension: Secondary | ICD-10-CM | POA: Diagnosis not present

## 2024-07-04 DIAGNOSIS — K580 Irritable bowel syndrome with diarrhea: Secondary | ICD-10-CM | POA: Diagnosis not present

## 2024-07-04 DIAGNOSIS — Z8546 Personal history of malignant neoplasm of prostate: Secondary | ICD-10-CM | POA: Diagnosis not present

## 2024-07-04 DIAGNOSIS — C61 Malignant neoplasm of prostate: Secondary | ICD-10-CM | POA: Diagnosis not present

## 2024-07-04 DIAGNOSIS — E119 Type 2 diabetes mellitus without complications: Secondary | ICD-10-CM | POA: Diagnosis not present

## 2024-07-04 DIAGNOSIS — M2550 Pain in unspecified joint: Secondary | ICD-10-CM | POA: Diagnosis not present

## 2024-07-04 DIAGNOSIS — R42 Dizziness and giddiness: Secondary | ICD-10-CM | POA: Diagnosis not present

## 2024-07-04 DIAGNOSIS — N1832 Chronic kidney disease, stage 3b: Secondary | ICD-10-CM | POA: Diagnosis not present

## 2024-07-04 DIAGNOSIS — D649 Anemia, unspecified: Secondary | ICD-10-CM | POA: Diagnosis not present

## 2024-07-04 DIAGNOSIS — M545 Low back pain, unspecified: Secondary | ICD-10-CM | POA: Diagnosis not present

## 2024-07-24 ENCOUNTER — Ambulatory Visit: Admitting: Podiatry

## 2024-11-29 ENCOUNTER — Other Ambulatory Visit: Payer: Self-pay | Admitting: Family Medicine

## 2024-11-29 DIAGNOSIS — E1159 Type 2 diabetes mellitus with other circulatory complications: Secondary | ICD-10-CM

## 2024-11-29 NOTE — Telephone Encounter (Signed)
 Patient notified VIA phone.  Moved to [place]. Dm/cma
